# Patient Record
Sex: Female | Born: 1950
Health system: Southern US, Community
[De-identification: ages and names within clinical notes are randomized; demographics above are authoritative.]

## PROBLEM LIST (undated history)

## (undated) DIAGNOSIS — K429 Umbilical hernia without obstruction or gangrene: Secondary | ICD-10-CM

## (undated) DIAGNOSIS — K769 Liver disease, unspecified: Secondary | ICD-10-CM

## (undated) DIAGNOSIS — T7840XA Allergy, unspecified, initial encounter: Secondary | ICD-10-CM

## (undated) DIAGNOSIS — K2100 Gastro-esophageal reflux disease with esophagitis, without bleeding: Secondary | ICD-10-CM

## (undated) DIAGNOSIS — D721 Eosinophilia, unspecified: Secondary | ICD-10-CM

## (undated) DIAGNOSIS — R7303 Prediabetes: Secondary | ICD-10-CM

## (undated) DIAGNOSIS — D72820 Lymphocytosis (symptomatic): Secondary | ICD-10-CM

## (undated) DIAGNOSIS — IMO0001 Reserved for inherently not codable concepts without codable children: Secondary | ICD-10-CM

## (undated) DIAGNOSIS — M949 Disorder of cartilage, unspecified: Secondary | ICD-10-CM

## (undated) DIAGNOSIS — E2740 Unspecified adrenocortical insufficiency: Secondary | ICD-10-CM

## (undated) DIAGNOSIS — M899 Disorder of bone, unspecified: Secondary | ICD-10-CM

## (undated) DIAGNOSIS — E785 Hyperlipidemia, unspecified: Secondary | ICD-10-CM

## (undated) DIAGNOSIS — D649 Anemia, unspecified: Secondary | ICD-10-CM

## (undated) DIAGNOSIS — Z5189 Encounter for other specified aftercare: Secondary | ICD-10-CM

## (undated) DIAGNOSIS — J301 Allergic rhinitis due to pollen: Secondary | ICD-10-CM

## (undated) DIAGNOSIS — R7989 Other specified abnormal findings of blood chemistry: Secondary | ICD-10-CM

## (undated) DIAGNOSIS — I739 Peripheral vascular disease, unspecified: Secondary | ICD-10-CM

## (undated) DIAGNOSIS — R51 Headache: Secondary | ICD-10-CM

## (undated) DIAGNOSIS — M810 Age-related osteoporosis without current pathological fracture: Secondary | ICD-10-CM

## (undated) DIAGNOSIS — M25519 Pain in unspecified shoulder: Secondary | ICD-10-CM

## (undated) DIAGNOSIS — E1165 Type 2 diabetes mellitus with hyperglycemia: Secondary | ICD-10-CM

## (undated) HISTORY — DX: Lymphocytosis (symptomatic): D72.820

## (undated) HISTORY — DX: Gastro-esophageal reflux disease with esophagitis, without bleeding: K21.00

## (undated) HISTORY — DX: Pain in unspecified shoulder: M25.519

## (undated) HISTORY — DX: Age-related osteoporosis without current pathological fracture: M81.0

## (undated) HISTORY — DX: Allergy, unspecified, initial encounter: T78.40XA

## (undated) HISTORY — DX: Eosinophilia, unspecified: D72.10

## (undated) HISTORY — DX: Anemia, unspecified: D64.9

## (undated) HISTORY — DX: Disorder of cartilage, unspecified: M94.9

## (undated) HISTORY — DX: Other specified abnormal findings of blood chemistry: R79.89

## (undated) HISTORY — DX: Type 2 diabetes mellitus with hyperglycemia: E11.65

## (undated) HISTORY — DX: Disorder of bone, unspecified: M89.9

## (undated) HISTORY — DX: Encounter for other specified aftercare: Z51.89

## (undated) HISTORY — DX: Liver disease, unspecified: K76.9

## (undated) HISTORY — DX: Umbilical hernia without obstruction or gangrene: K42.9

## (undated) HISTORY — DX: Hyperlipidemia, unspecified: E78.5

## (undated) HISTORY — DX: Gastro-esophageal reflux disease with esophagitis: K21.0

## (undated) HISTORY — DX: Allergic rhinitis due to pollen: J30.1

## (undated) HISTORY — PX: CATARACT EXTRACTION, BILATERAL: SHX1313

## (undated) HISTORY — DX: Reserved for inherently not codable concepts without codable children: IMO0001

## (undated) HISTORY — DX: Eosinophilia: D72.1

---

## 1998-09-10 ENCOUNTER — Other Ambulatory Visit: Admission: RE | Admit: 1998-09-10 | Discharge: 1998-09-10 | Payer: Self-pay | Admitting: *Deleted

## 2000-11-27 ENCOUNTER — Encounter: Payer: Self-pay | Admitting: *Deleted

## 2000-11-27 ENCOUNTER — Encounter: Admission: RE | Admit: 2000-11-27 | Discharge: 2000-11-27 | Payer: Self-pay | Admitting: *Deleted

## 2002-12-03 ENCOUNTER — Encounter: Payer: Self-pay | Admitting: Gastroenterology

## 2002-12-03 LAB — HM COLONOSCOPY: HM Colonoscopy: NORMAL

## 2003-11-25 ENCOUNTER — Emergency Department (HOSPITAL_COMMUNITY): Admission: EM | Admit: 2003-11-25 | Discharge: 2003-11-25 | Payer: Self-pay | Admitting: Emergency Medicine

## 2003-12-09 ENCOUNTER — Inpatient Hospital Stay (HOSPITAL_COMMUNITY): Admission: EM | Admit: 2003-12-09 | Discharge: 2003-12-11 | Payer: Self-pay | Admitting: Emergency Medicine

## 2003-12-16 ENCOUNTER — Inpatient Hospital Stay (HOSPITAL_COMMUNITY): Admission: EM | Admit: 2003-12-16 | Discharge: 2003-12-20 | Payer: Self-pay | Admitting: Emergency Medicine

## 2003-12-16 ENCOUNTER — Encounter (INDEPENDENT_AMBULATORY_CARE_PROVIDER_SITE_OTHER): Payer: Self-pay | Admitting: *Deleted

## 2003-12-17 ENCOUNTER — Encounter (INDEPENDENT_AMBULATORY_CARE_PROVIDER_SITE_OTHER): Payer: Self-pay | Admitting: Specialist

## 2003-12-17 ENCOUNTER — Encounter (INDEPENDENT_AMBULATORY_CARE_PROVIDER_SITE_OTHER): Payer: Self-pay | Admitting: *Deleted

## 2004-01-04 ENCOUNTER — Emergency Department (HOSPITAL_COMMUNITY): Admission: EM | Admit: 2004-01-04 | Discharge: 2004-01-05 | Payer: Self-pay | Admitting: Emergency Medicine

## 2007-01-04 LAB — HM DEXA SCAN: HM Dexa Scan: NORMAL

## 2007-11-23 ENCOUNTER — Encounter: Payer: Self-pay | Admitting: Gastroenterology

## 2008-05-26 ENCOUNTER — Encounter: Payer: Self-pay | Admitting: Gastroenterology

## 2008-06-06 ENCOUNTER — Encounter: Payer: Self-pay | Admitting: Gastroenterology

## 2008-08-15 ENCOUNTER — Ambulatory Visit: Payer: Self-pay | Admitting: Gastroenterology

## 2008-08-15 DIAGNOSIS — Z862 Personal history of diseases of the blood and blood-forming organs and certain disorders involving the immune mechanism: Secondary | ICD-10-CM

## 2008-08-15 DIAGNOSIS — R945 Abnormal results of liver function studies: Secondary | ICD-10-CM

## 2008-08-15 LAB — CONVERTED CEMR LAB
Basophils Absolute: 0 10*3/uL (ref 0.0–0.1)
Basophils Relative: 0.5 % (ref 0.0–3.0)
Eosinophils Absolute: 1.3 10*3/uL — ABNORMAL HIGH (ref 0.0–0.7)
Eosinophils Relative: 16.2 % — ABNORMAL HIGH (ref 0.0–5.0)
HCT: 36 % (ref 36.0–46.0)
Hemoglobin: 12.3 g/dL (ref 12.0–15.0)
Lymphocytes Relative: 57.5 % — ABNORMAL HIGH (ref 12.0–46.0)
Lymphs Abs: 4.6 10*3/uL — ABNORMAL HIGH (ref 0.7–4.0)
MCHC: 34.3 g/dL (ref 30.0–36.0)
MCV: 84.5 fL (ref 78.0–100.0)
Monocytes Absolute: 0.5 10*3/uL (ref 0.1–1.0)
Monocytes Relative: 6.3 % (ref 3.0–12.0)
Neutro Abs: 1.5 10*3/uL (ref 1.4–7.7)
Neutrophils Relative %: 19.5 % — ABNORMAL LOW (ref 43.0–77.0)
Platelets: 191 10*3/uL (ref 150.0–400.0)
RBC: 4.26 M/uL (ref 3.87–5.11)
RDW: 12.1 % (ref 11.5–14.6)
WBC: 7.9 10*3/uL (ref 4.5–10.5)

## 2010-05-07 ENCOUNTER — Ambulatory Visit
Admission: RE | Admit: 2010-05-07 | Discharge: 2010-05-07 | Disposition: A | Discharge status: Home / Self Care | Payer: PRIVATE HEALTH INSURANCE | Source: Ambulatory Visit | Attending: Internal Medicine | Admitting: Internal Medicine

## 2010-05-07 ENCOUNTER — Other Ambulatory Visit: Payer: Self-pay | Admitting: Internal Medicine

## 2010-05-07 DIAGNOSIS — Z9109 Other allergy status, other than to drugs and biological substances: Secondary | ICD-10-CM

## 2010-05-20 ENCOUNTER — Encounter: Payer: PRIVATE HEALTH INSURANCE | Admitting: Internal Medicine

## 2010-05-21 NOTE — Consult Note (Signed)
Heidi [name], Heidi [name]              ACCOUNT NO.:  [id number]   MEDICAL RECORD NO.:  [id number]          PATIENT TYPE:  INP   LOCATION:  5012                         FACILITY:  [facility]   PHYSICIAN:  James L. Malon Kindle., M.D.DATE OF BIRTH:  [birth year]   DATE OF CONSULTATION:  12/16/2003  DATE OF DISCHARGE:                                   CONSULTATION   REFERRING PHYSICIAN:  Fleet Contras, M.D.   CONSULTING PHYSICIAN:  Llana Aliment. Randa Evens, M.D.   HISTORY OF PRESENT ILLNESS:  This is a 60 year old woman who was admitted  through the emergency department for a second admission in the past two  weeks with nausea, vomiting and diarrhea.  She notes that she was well until  approximately three weeks ago when she had fairly sudden onset of diarrhea  with nausea and vomiting.  She saw her physician in [facility] and  apparently had Clostridium difficile that was positive and she was treated  initially with Flagyl.  She tolerated this poorly and apparently was  switched over to vancomycin.  It is unclear how long she took it.  Approximately two weeks ago she was admitted her to [facility]  Clostridium difficile toxin was repeated twice and was negative.  On  December 7 a sedimentation rate was normal at that time as well.  The  patient got a little better, went home only to have her nausea, vomiting and  diarrhea come back again.  She came in through the emergency room today with  cramping, loose stools of three to four per day and watery.  She vomits and  has been unable to keep down liquid or any other material.  She has become  progressively weaker over the past two or three weeks. She is dizzy, has a  headache and has been febrile.  Her husband notes that last night she woke  up at 3 o'clock with severe nausea, vomiting, loose stools and complaining  of a headache.  She had a CT scan of her head with no clear intracranial  findings, but with evidence of chronic small vessel  disease.  A CT scan of  her abdomen was done during the previous admission on December 7. This  revealed no acute findings.  The colon looked normal.  A CT of the pelvis  was negative as well.   Pertinent laboratories are remarkable for a normal white count this morning.  Sodium was down to 121 earlier this morning although it had been in the  normal range during her last visit.  Liver function tests were remarkable  with a low albumin of 2.8.  Amylase and lipase were elevated.  Iron studies  were low slightly.  B12 and folic levels were normal.  Urinalysis was  unremarkable.  The patient notes that despite taking the Reglan, vancomycin  and Flagyl that nothing has improved.  She was also placed on Protonix  somewhere along the way and this has not really helped.   CHRONIC MEDS PRIOR TO THIS STARTING:  None.   ALLERGIES:  She is intolerant of METRONIDAZOLE that caused nausea and  vomiting, but no clear allergies otherwise.   PAST MEDICAL HISTORY:  The patient has no chronic medical problems.   PAST SURGICAL HISTORY:  1.  Previous cesarean section.   FAMILY HISTORY:  Noncontributory.   SOCIAL HISTORY:  She is originally from [place].  She is married and  lives with her family.  She is a Theatre stage manager at [employer].  She  does not smoke or drink.   REVIEW OF SYMPTOMS:  Covered primarily in the history of present illness.   PHYSICAL EXAMINATION:  VITAL SIGNS:  The patient's temperature is 102.0 and  other vital signs are unremarkable.  GENERAL:  She is alert and oriented.  She looks warm and feels warm.  EYES:  The sclerae are non-icteric.  Extraocular movements are intact.  HEENT:  The neck is supple.  The throat reveals some mucous membranes to be  dry.  HEART AND LUNG EXAMS:  Normal.  ABDOMEN:  Soft with markedly hyperactive bowel sounds.   ASSESSMENT:  Nausea, vomiting, diarrhea and fever.  This could be due to a  multitude of things, but I think with the  recent Clostridium difficile  colitis we need to go ahead and evaluate further for this and make sure this  has cleared up with appropriate treatment.  If so, we need to explore other  causes.  It is possible this could be an ulcer and while she is getting a  sigmoidoscopy, I would go ahead and do an upper endoscopy.  If this is  negative, we may need to consider an ID consult.       JLE/MEDQ  D:  12/16/2003  T:  12/16/2003  Job:  [id number]

## 2010-05-21 NOTE — Op Note (Signed)
NAMEDARREL, BARONI              ACCOUNT NO.:  [id number]   MEDICAL RECORD NO.:  [id number]          PATIENT TYPE:  INP   LOCATION:  5012                         FACILITY:  [facility]   PHYSICIAN:  James L. Malon Kindle., M.D.DATE OF BIRTH:  December 10, 1950   DATE OF PROCEDURE:  12/16/2003  DATE OF DISCHARGE:                                 OPERATIVE REPORT   PROCEDURE:  Esophagogastroduodenoscopy.   MEDICATIONS GIVEN:  The patient was sedated with Fentanyl 40 mcg, Versed 4  mg prior to this procedure for the sigmoidoscopy.   INDICATIONS FOR PROCEDURE:  Severe nausea and vomiting.   DESCRIPTION OF PROCEDURE:  The procedure was explained to the patient and  consent obtained.  In the left lateral decubitus position, it took sometime  to pass the scope, the patient did not want to swallow but eventually she  did, and we passed down into the stomach.  The pyloric channel was  identified and passed.  The duodenum was completely normal as was the  pyloric channel.  No ulcers.  The duodenal bulb was clear.  The antrum and  body of the stomach were normal.  There was no ulceration or inflammation.  The distal esophagus was seen well upon withdrawal as was the proximal  esophagus, there were no abnormalities seen.  The scope was withdrawn.  The  patient tolerated the procedure well.   ASSESSMENT:  Nausea and vomiting, negative endoscopy, suspect due to  colitis.   PLAN:  Will treat the colitis and follow clinically.       JLE/MEDQ  D:  12/16/2003  T:  12/16/2003  Job:  [id number]   cc:   Fleet Contras, M.D.  [address]  [place]  [place] [address]  Fax: [phone]

## 2010-05-21 NOTE — Discharge Summary (Signed)
Heidi, [name]           ACCOUNT NO.:  [id number]   MEDICAL RECORD NO.:  [id number]          PATIENT TYPE:  INP   LOCATION:  5012                         FACILITY:  [facility]   PHYSICIAN:  Fleet Contras, M.D.    DATE OF BIRTH:  [birth year]   DATE OF ADMISSION:  12/16/2003  DATE OF DISCHARGE:  12/20/2003                                 DISCHARGE SUMMARY   ADMITTING DIAGNOSES:  1.  Dehydration.  2.  Persistent vomiting.  3.  Hyponatremia.   DISCHARGE DIAGNOSES:  1.  Pseudomembranous colitis secondary to Clostridium difficile infection.  2.  Dehydration, resolved.  3.  Hyponatremia, resolved.   PRESENTATION:  Heidi [name] is a 60 year old African-American lady admitted  via the Emergency Room at [facility] for the second time in the  last two weeks with nausea, vomiting, and diarrhea.  She was fairly well  until about three weeks ago when she developed fairly sudden onset of  diarrhea with nausea and vomiting.  She was seen by her primary care  physician at [facility], Dr. Orpha Bur who apparently  treated her for Clostridium difficile infection diagnosed by stool cultures.  She initially was treated with Flagyl but she did not tolerate this well and  was switched to vancomycin.  While on vancomycin she also developed fairly  severe nausea, vomiting, hyponatremia, and dehydration and had to be  admitted about a week ago at [facility].  At that time Clostridium  difficile testing on her stool was negative for toxin x2.  She had a CT scan  of the abdomen and pelvis that were performed showing incidental finding of  hepatic and renal cysts, but no acute process.  The colon looked normal on  that scan.  She also had an HIV test which was negative.  Sedimentation rate  was normal at that time.  Her liver function was normal.  Once her  electrolytes were normalized she was discharged home.  On December 16, 2003  she presented again to the  emergency room with recurrent symptoms as well as  dizziness, headaches, and fevers and chills.  At the emergency room she was  noted to be dehydrated, lethargic, and acutely ill.  A CT scan of her head  was negative for acute intracranial findings except for some chronic small  vessel disease.  Her blood pressure showed systolic of 98, diastolic of 61.  Heart rate was 77, respiratory rate of 18.  Temperature was 97.4.  Oxygen  saturation on room air was 92%.  She was not icteric.  She was not cyanotic.  Abdomen was soft with vague tenderness in the epigastrium.  She was alert  and oriented x3 with no focal neurological deficits.  Initial laboratory  data showed a serum pH of 7.5, sodium of 121, potassium 3.3, chloride 99,  bicarbonate of 17, BUN of 5, creatinine 0.7, sugar of 82.  Total bilirubin  was 0.8.  AST/ALT, and alkaline phosphatase were all normal.  Albumin was  2.8, calcium 7.1.  White count was 5.1, hemoglobin 10, hematocrit 30,  platelet count of 291.  This  was normochromic and normocytic.  Due to the  severity of her symptoms and the recurrent nature, she was admitted to the  hospital for further evaluation.   HOSPITAL COURSE:  On admission patient was started on intravenous fluids  with D5 normal saline with potassium replacement.  She was also started on  intravenous antiemetics.  Serum TSH and cortisol a.m. level were performed  and these came back normal.  She also had checked for metallic poisoning.  Lead and mercury levels were both normal.  About 12 hours after her  admission her temperature rose up to about 102.4.  Blood cultures were  performed as well as urinalysis and urine cultures and this again returned  normal.  A gastroenterology consult was requested and patient was seen by  Dr. Carman Ching of [facility].  He immediately performed an  upper GI endoscopy and colonoscopy.  The upper GI endoscopy revealed no  significant findings.  Colonoscopy,  however, showed adenomatous colonic  mucosa suggestive of inadequately treated pseudomembranous colitis.  She was  therefore resumed on oral vancomycin while awaiting stool aspirates and  colon biopsies.  Within 24 hours she defervesced and was able to tolerate  clear liquid diet.  Her diet was gradually advanced and she was continued on  vancomycin 125 mg p.o. q.i.d.  She received Imodium p.r.n. for diarrhea  stools.  In the last 24 hours she has not had any further diarrhea, nausea,  vomiting, or abdominal pain.  She is tolerating food, diet without any  vomiting.  She is well hydrated.  Vital signs are stable and she has been  afebrile for 48 hours.  Chest is clear to auscultation.  Heart sounds 1 and  2 are heard with no murmurs.  Abdomen is soft, nontender.  Bowel sounds are  present.  Extremities show no edema or ulcerations.  Cranial nerves:  She is  alert and oriented x3 with no focal neurological deficits.   LABORATORY DATA:  On December 17, 2003 hemoglobin was 10.7, hematocrit 30.9,  white count 6.2.  On December 18, 2003 her sodium was 136, potassium 3.5,  chloride 112, bicarbonate 20, glucose 87, BUN less than 1, creatinine 0.9,  calcium 7.9.  Blood cultures x2 so far has been negative.  Urinalysis was  negative and urine culture was negative.  Lead levels were 2 and mercury  levels 5.  Cortisol level was 15.5 which was within normal limits.  TSH  0.498, within normal limits.  CPK was 72.  Myoglobin was normal.  Her ANA  was negative.  Helicobacter pylori antibody test was 1.2 which was slightly  positive.  Stool for ovum and parasites were negative.  Stool aspirate for  Clostridium difficile toxin was positive.   ASSESSMENT:  Heidi [name] is a 60 year old African-American lady with  recurrent and persistent Clostridium difficile associated pseudomembranous  colitis manifesting as severe nausea, vomiting, and diarrhea resulting in dehydration and electrolyte imbalance.   She has received intravenous fluids  as well as electrolyte replacement and she is currently on therapy for  Clostridium difficile colitis with oral vancomycin.  She has been tolerating  this for the last 48 hours.  She has not had any fevers or chills and her  laboratory data within normal limits.  She is considered stable for  discharge home today.  She will continue on vancomycin for another 10 days.   DISCHARGE MEDICATIONS:  1.  Protonix 40 mg one daily.  2.  Reglan 5 mg one  p.o. t.i.d. a.c.  3.  Imodium 2 mg one p.o. p.r.n. with loose bowel movements.  4.  Vancomycin 125 mg p.o. q.i.d. for 10 days.   She is to maintain universal precautions at home so she does not disseminate  the disease to her family members.  She will follow up with Dr. Orpha Bur  at [facility] in [place] in one to two weeks.  This  plan of care has been discussed with her, her husband, and the rest of her  family and all their questions have been answered.       EA/MEDQ  D:  12/20/2003  T:  12/22/2003  Job:  [id number]   cc:   Orpha Bur, M.D.  [facility]

## 2010-05-21 NOTE — Op Note (Signed)
Heidi [name], Heidi [name]              ACCOUNT NO.:  [id number]   MEDICAL RECORD NO.:  [id number]          PATIENT TYPE:  INP   LOCATION:  5012                         FACILITY:  [facility]   PHYSICIAN:  James L. Malon Kindle., M.D.DATE OF BIRTH:  Aug 30, 1950   DATE OF PROCEDURE:  12/16/2003  DATE OF DISCHARGE:                                 OPERATIVE REPORT   PROCEDURE:  Sigmoidoscopy and biopsy.   MEDICATIONS:  1.  Fentanyl 40 mcg.  2.  Versed 4 mg IV.   INDICATION:  A 60 year old with nausea, vomiting and diarrhea who has had a  positive C. diff toxin in the past with recent C. diff toxin negative.  Had  some Flagyl and vancomycin in [place], not really sure how long she  took it.  This is done to evaluate for recurrent Pseudomembranous colitis.   DESCRIPTION OF PROCEDURE:  Procedure had been explained to the patient.  We  did this first since she was unable to place the biteblock in her mouth  after the sedation for the upper endo.  The flexible scope was used and we  advanced up to 90 cm, there was a large quantity of liquid stool, a sample  of which was sucked out.  Throughout the colon up to that point there was  markedly edematous mucosa with several punctate ulcerations throughout.  It  had the appearance of a colitis, no clear cut pseudomembranes.  Multiple  biopsies were taken and mucosa was quite friable.  The scope was withdrawn.  These findings extended from the rectum to 90 cm from the anal verge.   ASSESSMENT:  Colitis, probably inadequately treated Clostridium difficile  colitis although infectious inflammatory bowel disease is still in the  differential.   PLAN:  Will start the patient back empirically on oral vancomycin waiting  for studies to return and will check path report, send stool for C. diff,  ova and parasite, white cell smear and culture.  Will proceed with endoscopy  at this time.       JLE/MEDQ  D:  12/16/2003  T:  12/16/2003  Job:  [id number]

## 2010-05-24 ENCOUNTER — Encounter (HOSPITAL_BASED_OUTPATIENT_CLINIC_OR_DEPARTMENT_OTHER): Payer: PRIVATE HEALTH INSURANCE | Admitting: Internal Medicine

## 2010-05-24 ENCOUNTER — Other Ambulatory Visit: Payer: Self-pay | Admitting: Internal Medicine

## 2010-05-24 DIAGNOSIS — J309 Allergic rhinitis, unspecified: Secondary | ICD-10-CM

## 2010-05-24 LAB — COMPREHENSIVE METABOLIC PANEL
AST: 19 U/L (ref 0–37)
Alkaline Phosphatase: 71 U/L (ref 39–117)
BUN: 19 mg/dL (ref 6–23)
Calcium: 9.2 mg/dL (ref 8.4–10.5)
Creatinine, Ser: 1.02 mg/dL (ref 0.40–1.20)

## 2010-05-24 LAB — CBC WITH DIFFERENTIAL/PLATELET
Basophils Absolute: 0.1 10*3/uL (ref 0.0–0.1)
Eosinophils Absolute: 1.6 10*3/uL — ABNORMAL HIGH (ref 0.0–0.5)
HCT: 34.9 % (ref 34.8–46.6)
LYMPH%: 42.5 % (ref 14.0–49.7)
MCV: 86.9 fL (ref 79.5–101.0)
MONO#: 0.4 10*3/uL (ref 0.1–0.9)
MONO%: 5.1 % (ref 0.0–14.0)
NEUT#: 2.1 10*3/uL (ref 1.5–6.5)
NEUT%: 28.8 % — ABNORMAL LOW (ref 38.4–76.8)
Platelets: 191 10*3/uL (ref 145–400)
RBC: 4.02 10*6/uL (ref 3.70–5.45)
WBC: 7.1 10*3/uL (ref 3.9–10.3)

## 2010-08-30 ENCOUNTER — Other Ambulatory Visit: Payer: Self-pay | Admitting: Internal Medicine

## 2010-08-30 ENCOUNTER — Encounter (HOSPITAL_BASED_OUTPATIENT_CLINIC_OR_DEPARTMENT_OTHER): Payer: PRIVATE HEALTH INSURANCE | Admitting: Internal Medicine

## 2010-08-30 DIAGNOSIS — J309 Allergic rhinitis, unspecified: Secondary | ICD-10-CM

## 2010-08-30 LAB — CBC WITH DIFFERENTIAL/PLATELET
Basophils Absolute: 0.1 10*3/uL (ref 0.0–0.1)
Eosinophils Absolute: 1.6 10*3/uL — ABNORMAL HIGH (ref 0.0–0.5)
HGB: 12.6 g/dL (ref 11.6–15.9)
MCV: 86.1 fL (ref 79.5–101.0)
MONO#: 0.4 10*3/uL (ref 0.1–0.9)
MONO%: 5.9 % (ref 0.0–14.0)
NEUT#: 1.7 10*3/uL (ref 1.5–6.5)
Platelets: 190 10*3/uL (ref 145–400)
RDW: 13.1 % (ref 11.2–14.5)

## 2010-08-30 LAB — COMPREHENSIVE METABOLIC PANEL
Alkaline Phosphatase: 72 U/L (ref 39–117)
BUN: 17 mg/dL (ref 6–23)
Glucose, Bld: 52 mg/dL — ABNORMAL LOW (ref 70–99)
Total Bilirubin: 0.4 mg/dL (ref 0.3–1.2)

## 2011-01-22 ENCOUNTER — Telehealth: Payer: Self-pay | Admitting: Internal Medicine

## 2011-01-22 NOTE — Telephone Encounter (Signed)
Talked to pt, gave her appt for 02/28/11 , lab and MD

## 2011-02-28 ENCOUNTER — Ambulatory Visit (HOSPITAL_BASED_OUTPATIENT_CLINIC_OR_DEPARTMENT_OTHER): Payer: PRIVATE HEALTH INSURANCE | Admitting: Internal Medicine

## 2011-02-28 ENCOUNTER — Telehealth: Payer: Self-pay | Admitting: Internal Medicine

## 2011-02-28 ENCOUNTER — Other Ambulatory Visit: Payer: PRIVATE HEALTH INSURANCE | Admitting: Lab

## 2011-02-28 VITALS — BP 108/65 | HR 59 | Temp 96.9°F | Wt 121.3 lb

## 2011-02-28 DIAGNOSIS — D721 Eosinophilia, unspecified: Secondary | ICD-10-CM

## 2011-02-28 DIAGNOSIS — J309 Allergic rhinitis, unspecified: Secondary | ICD-10-CM

## 2011-02-28 LAB — CBC WITH DIFFERENTIAL/PLATELET
Basophils Absolute: 0.2 10*3/uL — ABNORMAL HIGH (ref 0.0–0.1)
Eosinophils Absolute: 3.1 10*3/uL — ABNORMAL HIGH (ref 0.0–0.5)
HCT: 36 % (ref 34.8–46.6)
HGB: 11.8 g/dL (ref 11.6–15.9)
LYMPH%: 41.9 % (ref 14.0–49.7)
MONO#: 0.4 10*3/uL (ref 0.1–0.9)
NEUT#: 1.6 10*3/uL (ref 1.5–6.5)
NEUT%: 17.4 % — ABNORMAL LOW (ref 38.4–76.8)
Platelets: 180 10*3/uL (ref 145–400)
WBC: 9 10*3/uL (ref 3.9–10.3)

## 2011-02-28 LAB — LACTATE DEHYDROGENASE: LDH: 184 U/L (ref 94–250)

## 2011-02-28 NOTE — Telephone Encounter (Signed)
Gv pt appt for [id number].  called Asher Muir @ Infectious disease and informed her of referral for appt

## 2011-02-28 NOTE — Progress Notes (Signed)
[facility] [phone]   [phone]  OFFICE PROGRESS NOTE   DIAGNOSIS: Reactive eosinophilia  CURRENT THERAPY: Observation.   INTERVAL HISTORY: Heidi [name] 61 y.o. female returns to the clinic today for six-month followup visit. The patient is feeling fine today with no specific complaints. She denied having any significant fever or chills, no skin rash. No weight loss or night sweats. She continues her treatment with Zyrtec for seasonal allergy. The most recent covered history for this patient was 2 years ago to [place], her native country. She has repeat CBC performed earlier today and she is here for evaluation and discussion of her lab results.  ALLERGIES:   has no known allergies.  MEDICATIONS:  Current Outpatient Prescriptions  Medication Sig Dispense Refill  . aspirin 81 MG tablet Take 81 mg by mouth daily.      . calcium-vitamin D (OSCAL WITH D) 500-200 MG-UNIT per tablet Take 1 tablet by mouth daily.      . Multiple Vitamin (MULTIVITAMIN) tablet Take 1 tablet by mouth daily.       REVIEW OF SYSTEMS:  A comprehensive review of systems was negative.   PHYSICAL EXAMINATION: General appearance: alert, cooperative and no distress Neck: no adenopathy Lymph nodes: Cervical, supraclavicular, and axillary nodes normal. Resp: clear to auscultation bilaterally Cardio: regular rate and rhythm, S1, S2 normal, no murmur, click, rub or gallop GI: soft, non-tender; bowel sounds normal; no masses,  no organomegaly Extremities: extremities normal, atraumatic, no cyanosis or edema Neurologic: Alert and oriented X 3, normal strength and tone. Normal symmetric reflexes. Normal coordination and gait  ECOG PERFORMANCE STATUS: 0 - Asymptomatic  Blood pressure 108/65, pulse 59, temperature 96.9 F (36.1 C), temperature source Oral, weight 121 lb 4.8 oz (55.021 kg).  LABORATORY DATA: Lab Results  Component Value Date   WBC 9.0 02/28/2011   HGB  11.8 02/28/2011   HCT 36.0 02/28/2011   MCV 88.0 02/28/2011   PLT 180 02/28/2011      Chemistry      Component Value Date/Time   NA 142 08/30/2010 0904   NA 142 08/30/2010 0904   K 4.2 08/30/2010 0904   K 4.2 08/30/2010 0904   CL 107 08/30/2010 0904   CL 107 08/30/2010 0904   CO2 27 08/30/2010 0904   CO2 27 08/30/2010 0904   BUN 17 08/30/2010 0904   BUN 17 08/30/2010 0904   CREATININE 1.03 08/30/2010 0904   CREATININE 1.03 08/30/2010 0904      Component Value Date/Time   CALCIUM 9.6 08/30/2010 0904   CALCIUM 9.6 08/30/2010 0904   ALKPHOS 72 08/30/2010 0904   ALKPHOS 72 08/30/2010 0904   AST 22 08/30/2010 0904   AST 22 08/30/2010 0904   ALT 15 08/30/2010 0904   ALT 15 08/30/2010 0904   BILITOT 0.4 08/30/2010 0904   BILITOT 0.4 08/30/2010 0904       RADIOGRAPHIC STUDIES: No results found.  ASSESSMENT: This is a very pleasant 61 years old African female originally from [place]. She has persistent eosinophilia questionable to be reactive in nature secondary to parasitic infection versus seasonal allergy, but chronic eosinophilic syndrome cannot be ordered out at this point. The patient is currently asymptomatic.  PLAN:  #1 I would continue his admission observation for now with repeat CBC, LDH and a comprehensive metabolic panel in 3 months. #2 I will refer your the patient to infectious disease for evaluation and to rule out any parasitic infection as a  cause of her eosinophilia. The patient was advised to call me immediately if she has any concerning symptoms in the interval.   All questions were answered. The patient knows to call the clinic with any problems, questions or concerns. We can certainly see the patient much sooner if necessary.

## 2011-04-04 ENCOUNTER — Ambulatory Visit (INDEPENDENT_AMBULATORY_CARE_PROVIDER_SITE_OTHER): Payer: PRIVATE HEALTH INSURANCE | Admitting: Internal Medicine

## 2011-04-04 ENCOUNTER — Ambulatory Visit: Payer: PRIVATE HEALTH INSURANCE | Admitting: Internal Medicine

## 2011-04-04 ENCOUNTER — Encounter: Payer: Self-pay | Admitting: Internal Medicine

## 2011-04-04 VITALS — BP 109/72 | HR 69 | Temp 98.0°F | Ht 61.0 in | Wt 119.0 lb

## 2011-04-05 ENCOUNTER — Encounter: Payer: Self-pay | Admitting: Internal Medicine

## 2011-04-05 LAB — OVA AND PARASITE EXAMINATION: OP: NONE SEEN

## 2011-04-05 NOTE — Assessment & Plan Note (Signed)
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  Her liver cyst does bring up the possibility of echinococcus.  Eosinophilia that is persistent tends to be related to helminth infections and less likely protozoa.  I will check for the possibilities with ova and parasite exam of the stool x 3, the sputum x 3 (ie paragonamiasis) and serology for strongyloides, toxocara, HIV.    I also will try to discuss the case with Dr. Irish Lack at [facility] in [place] who has extensive experience in this area to see if there are other infectious causes to consider in an otherwise asymptomatic patient.  Thanks for the intriguing consult.  She will return to clinic in 3-4 weeks to discuss the lab results.

## 2011-04-05 NOTE — Progress Notes (Signed)
  Subjective:    Patient ID: Heidi [name], female    DOB: 1950-11-27, 61 y.o.   MRN: [id number]  HPI 61 yo female originally from [place] who has been followed by Dr. Shirline Frees for persistent eosinophilia.  The patient reports knowing about this eosinophilia for at least 20 years and no etiology has ever been noted.  She has been in the [place] since about 1991 with very infrequent trips back to [place], the most recent one last year for 3 weeks.  [place] farm and not directly interacting with animals.  She does not remember a specific episode in her life of a parasitic disease.  She has had persistent eosinophilia > 1000 and most recently was noted to be > 3000.  She does have seasonal allergies but controlled with claritin.  She does not get hives or other rashes, no excessive itching.  No excessive diarrhea, joint complaints, abdominal discomfort, weight loss, breathing problems.  She has had a CXR in the past that was negative and a CT scan that did show a cyst lesion on the right lobe of the liver and smaller ones on the kidney.  No abnormal LFTs or renal dysfunction.  Allergy symptoms have only been sneezing, runny nose.     Review of Systems  Constitutional: Negative for fever, chills, activity change, appetite change, fatigue and unexpected weight change.  HENT: Positive for rhinorrhea, sneezing and postnasal drip. Negative for sore throat, mouth sores, trouble swallowing, neck stiffness, voice change, sinus pressure and ear discharge.   Eyes: Positive for itching. Negative for photophobia, discharge, redness and visual disturbance.  Cardiovascular: Negative for chest pain, palpitations and leg swelling.  Gastrointestinal: Negative for nausea, abdominal pain, diarrhea, constipation and abdominal distention.  Genitourinary: Negative for dysuria, hematuria, menstrual problem and pelvic pain.  Musculoskeletal: Negative for myalgias, joint swelling and  arthralgias.  Skin: Negative for pallor and rash.  Neurological: Negative for dizziness, facial asymmetry, weakness and headaches.  Hematological: Negative for adenopathy. Does not bruise/bleed easily.  Psychiatric/Behavioral: Negative for sleep disturbance and dysphoric mood. The patient is not nervous/anxious.        Objective:   Physical Exam  Constitutional: She is oriented to person, place, and time. She appears well-developed and well-nourished. No distress.  HENT:  Right Ear: External ear normal.  Left Ear: External ear normal.  Mouth/Throat: Oropharynx is clear and moist. No oropharyngeal exudate.  Eyes: Right eye exhibits no discharge. Left eye exhibits no discharge. No scleral icterus.  Neck: No tracheal deviation present.  Cardiovascular: Normal rate, regular rhythm and normal heart sounds.  Exam reveals no gallop and no friction rub.   No murmur heard. Pulmonary/Chest: Effort normal and breath sounds normal. No respiratory distress. She has no wheezes. She has no rales.  Abdominal: Soft. Bowel sounds are normal. She exhibits no distension. There is no tenderness. There is no rebound.  Musculoskeletal: Normal range of motion. She exhibits no edema.  Lymphadenopathy:    She has no cervical adenopathy.  Neurological: She is alert and oriented to person, place, and time. No cranial nerve deficit.  Skin: Skin is warm and dry. No rash noted. No erythema.  Psychiatric: She has a normal mood and affect. Her behavior is normal.          Assessment & Plan:

## 2011-04-06 ENCOUNTER — Other Ambulatory Visit: Payer: PRIVATE HEALTH INSURANCE

## 2011-04-08 LAB — OVA AND PARASITE EXAMINATION
OP: NONE SEEN
OP: NONE SEEN
OP: NONE SEEN
OP: NONE SEEN
OP: NONE SEEN

## 2011-04-08 LAB — TOXOCARA ANTIBODIES, BLD

## 2011-04-08 LAB — STRONGYLOIDES ANTIBODY

## 2011-04-25 ENCOUNTER — Encounter: Payer: Self-pay | Admitting: Internal Medicine

## 2011-04-25 ENCOUNTER — Ambulatory Visit (INDEPENDENT_AMBULATORY_CARE_PROVIDER_SITE_OTHER): Payer: PRIVATE HEALTH INSURANCE | Admitting: Internal Medicine

## 2011-04-25 VITALS — BP 122/79 | HR 71 | Temp 97.7°F | Ht 62.0 in | Wt 118.0 lb

## 2011-04-25 DIAGNOSIS — D721 Eosinophilia, unspecified: Secondary | ICD-10-CM

## 2011-04-25 MED ORDER — IVERMECTIN 3 MG PO TABS: 10.5000 mg | ORAL_TABLET | Freq: Once | ORAL | Status: DC

## 2011-04-25 NOTE — Progress Notes (Signed)
  Subjective:    Patient ID: Heidi [name], female    DOB: 1950-11-06, 61 y.o.   MRN: [id number]  HPI Patient here to review her labs.  Has had asymptomatic eosinophilia for years, at least since early 47s.  Had evaluation including O and P in stool and sputum, blood evaluation.  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  Remains asympotomatic.     Review of Systems  Gastrointestinal: Negative for nausea, vomiting, diarrhea and abdominal distention.  Musculoskeletal: Negative for myalgias, joint swelling and arthralgias.  Skin: Negative for rash.  Hematological: Negative for adenopathy.  Psychiatric/Behavioral: Negative for dysphoric mood. The patient is not nervous/anxious.        Objective:   Physical Exam  Constitutional: She appears well-developed and well-nourished. No distress.  Cardiovascular: Normal rate, regular rhythm and normal heart sounds.  Exam reveals no gallop and no friction rub.   No murmur heard. Pulmonary/Chest: Effort normal and breath sounds normal. No respiratory distress. She has no wheezes. She has no rales.          Assessment & Plan:

## 2011-04-25 NOTE — Assessment & Plan Note (Addendum)
A positive serology with strongyloides certainly could be the cause.  I have discussed the findings with her and her husband and the possibility of an asymptomatic Strongyloides infection as the cause.  I have discussed treatement options and she has opted to try the treatment.  She will take 10.5 mg Ivermectin x 1 (252mcg/kg) and repeat 2 weeks later.  She then will return in 3 months for a follow up CBC with diff and appt.  I explained that it can take 6 months for eosinophilia to resolve if, indeed, that is the cause.  I also explained that if it persists beyond 6 months, Strongyloides likely is not the problem.

## 2011-05-01 ENCOUNTER — Ambulatory Visit (INDEPENDENT_AMBULATORY_CARE_PROVIDER_SITE_OTHER): Payer: PRIVATE HEALTH INSURANCE | Admitting: Internal Medicine

## 2011-05-01 VITALS — BP 115/76 | HR 78 | Temp 98.3°F | Resp 16 | Ht 60.75 in | Wt 120.0 lb

## 2011-05-01 DIAGNOSIS — K529 Noninfective gastroenteritis and colitis, unspecified: Secondary | ICD-10-CM

## 2011-05-01 DIAGNOSIS — R1110 Vomiting, unspecified: Secondary | ICD-10-CM

## 2011-05-01 DIAGNOSIS — N259 Disorder resulting from impaired renal tubular function, unspecified: Secondary | ICD-10-CM

## 2011-05-01 MED ORDER — ONDANSETRON 4 MG PO TBDP
8.0000 mg | ORAL_TABLET | Freq: Once | ORAL | Status: AC
  Administered 2011-05-01: 8 mg via ORAL

## 2011-05-01 MED ORDER — ONDANSETRON HCL 8 MG PO TABS: 8.0000 mg | ORAL_TABLET | Freq: Three times a day (TID) | ORAL | Status: AC | PRN

## 2011-05-01 NOTE — Patient Instructions (Signed)

## 2011-05-01 NOTE — Progress Notes (Signed)
  Subjective:    Patient ID: Heidi [name], female    DOB: Feb 19, 1950, 61 y.o.   MRN: [id number]  HPI Started at 2am with diarrhea watery and brown, then vomited 3x. No blood, fever ,or abdominal pain No exposure hx at all. No dizzyness   Review of Systems     Objective:   Physical Exam Alert and active Abd is soft and not tender, BS active       Assessment & Plan:  Viral gastroenteritis No signs of dehydration Zofran 8mg  dissolve now Hydrate and diet discussed

## 2011-05-23 ENCOUNTER — Other Ambulatory Visit (HOSPITAL_BASED_OUTPATIENT_CLINIC_OR_DEPARTMENT_OTHER): Payer: PRIVATE HEALTH INSURANCE | Admitting: Lab

## 2011-05-23 ENCOUNTER — Ambulatory Visit (HOSPITAL_BASED_OUTPATIENT_CLINIC_OR_DEPARTMENT_OTHER): Payer: PRIVATE HEALTH INSURANCE | Admitting: Internal Medicine

## 2011-05-23 VITALS — BP 111/77 | HR 69 | Temp 97.6°F | Ht 61.0 in | Wt 118.3 lb

## 2011-05-23 DIAGNOSIS — D72820 Lymphocytosis (symptomatic): Secondary | ICD-10-CM

## 2011-05-23 LAB — CBC WITH DIFFERENTIAL/PLATELET
BASO%: 1.2 % (ref 0.0–2.0)
Basophils Absolute: 0.1 10*3/uL (ref 0.0–0.1)
EOS%: 10.9 % — ABNORMAL HIGH (ref 0.0–7.0)
MCH: 28.6 pg (ref 25.1–34.0)
MCHC: 32.8 g/dL (ref 31.5–36.0)
MCV: 87 fL (ref 79.5–101.0)
MONO%: 6.8 % (ref 0.0–14.0)
RBC: 4.12 10*6/uL (ref 3.70–5.45)
RDW: 12.8 % (ref 11.2–14.5)
lymph#: 3.6 10*3/uL — ABNORMAL HIGH (ref 0.9–3.3)
nRBC: 0 % (ref 0–0)

## 2011-05-23 LAB — COMPREHENSIVE METABOLIC PANEL
Alkaline Phosphatase: 76 U/L (ref 39–117)
BUN: 13 mg/dL (ref 6–23)
CO2: 28 mEq/L (ref 19–32)
Creatinine, Ser: 0.9 mg/dL (ref 0.50–1.10)
Glucose, Bld: 70 mg/dL (ref 70–99)
Sodium: 138 mEq/L (ref 135–145)
Total Bilirubin: 0.3 mg/dL (ref 0.3–1.2)
Total Protein: 7.3 g/dL (ref 6.0–8.3)

## 2011-05-23 LAB — LACTATE DEHYDROGENASE: LDH: 156 U/L (ref 94–250)

## 2011-05-23 NOTE — Progress Notes (Signed)
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 Luciana Axe.  CURRENT THERAPY: None.  INTERVAL HISTORY: Heidi [name] 61 y.o. female returns to the clinic today for followup visit. The patient is feeling fine today with no specific complaints. She was referred to Dr. Luciana Axe with infectious disease and serology was positive for strongyloides infection. She was treated with 2 doses of Ivermectin. She is feeling much better. He has repeat CBC performed earlier today and she is here for evaluation and discussion of her lab results. No significant complaints.  MEDICAL HISTORY: Past Medical History  Diagnosis Date  . Allergy     ALLERGIES:   has no known allergies.  MEDICATIONS:  Current Outpatient Prescriptions  Medication Sig Dispense Refill  . aspirin 81 MG tablet Take 81 mg by mouth daily.      . calcium-vitamin D (OSCAL WITH D) 500-200 MG-UNIT per tablet Take 1 tablet by mouth daily.      . cetirizine (ZYRTEC) 5 MG tablet Take 5 mg by mouth as needed.       . Multiple Vitamin (MULTIVITAMIN) tablet Take 1 tablet by mouth daily.        REVIEW OF SYSTEMS:  A comprehensive review of systems was negative.   PHYSICAL EXAMINATION: General appearance: alert, cooperative and no distress Neck: no adenopathy Lymph nodes: Cervical, supraclavicular, and axillary nodes normal. Resp: clear to auscultation bilaterally Cardio: regular rate and rhythm, S1, S2 normal, no murmur, click, rub or gallop GI: soft, non-tender; bowel sounds normal; no masses,  no organomegaly Extremities: extremities normal, atraumatic, no cyanosis or edema  ECOG PERFORMANCE STATUS: 0 - Asymptomatic  Blood pressure 111/77, pulse 69, temperature 97.6 F (36.4 C), temperature source Oral, height 5\' 1"  (1.549 m),  weight 118 lb 4.8 oz (53.661 kg).  LABORATORY DATA: Lab Results  Component Value Date   WBC 6.7 05/23/2011   HGB 11.8 05/23/2011   HCT 35.8 05/23/2011   MCV 87.0 05/23/2011   PLT 221 05/23/2011      Chemistry      Component Value Date/Time   NA 142 08/30/2010 0904   NA 142 08/30/2010 0904   K 4.2 08/30/2010 0904   K 4.2 08/30/2010 0904   CL 107 08/30/2010 0904   CL 107 08/30/2010 0904   CO2 27 08/30/2010 0904   CO2 27 08/30/2010 0904   BUN 17 08/30/2010 0904   BUN 17 08/30/2010 0904   CREATININE 1.03 08/30/2010 0904   CREATININE 1.03 08/30/2010 0904      Component Value Date/Time   CALCIUM 9.6 08/30/2010 0904   CALCIUM 9.6 08/30/2010 0904   ALKPHOS 72 08/30/2010 0904   ALKPHOS 72 08/30/2010 0904   AST 22 08/30/2010 0904   AST 22 08/30/2010 0904   ALT 15 08/30/2010 0904   ALT 15 08/30/2010 0904   BILITOT 0.4 08/30/2010 0904   BILITOT 0.4 08/30/2010 0904       RADIOGRAPHIC STUDIES: No results found.  ASSESSMENT: This is a very pleasant 61 years old African female with reactive eosinophilia secondary to parasitic infections. The patient is doing fine after treatment for strongyloides. There is significant improvement in her any significant count. The patient continues to have mild lymphocytosis which could be secondary to her recent viral infection or an early chronic lymphocytic leukemia.  PLAN: I discussed the lab  result with the patient today. I recommended for her continuous observation with her primary care physician and Dr. Luciana Axe. I will see her on as-needed basis at this point. She was advised to call me immediately if she has any concerning symptoms or worsening of her lymphocytosis.  All questions were answered. The patient knows to call the clinic with any problems, questions or concerns. We can certainly see the patient much sooner if necessary.

## 2011-07-28 ENCOUNTER — Other Ambulatory Visit (INDEPENDENT_AMBULATORY_CARE_PROVIDER_SITE_OTHER): Payer: PRIVATE HEALTH INSURANCE

## 2011-07-28 LAB — CBC WITH DIFFERENTIAL/PLATELET
Basophils Absolute: 0 10*3/uL (ref 0.0–0.1)
HCT: 32.8 % — ABNORMAL LOW (ref 36.0–46.0)
Lymphocytes Relative: 69 % — ABNORMAL HIGH (ref 12–46)
Lymphs Abs: 4.5 10*3/uL — ABNORMAL HIGH (ref 0.7–4.0)
MCV: 82 fL (ref 78.0–100.0)
Monocytes Absolute: 0.4 10*3/uL (ref 0.1–1.0)
Neutro Abs: 1.2 10*3/uL — ABNORMAL LOW (ref 1.7–7.7)
RBC: 4 MIL/uL (ref 3.87–5.11)
RDW: 12.8 % (ref 11.5–15.5)
WBC: 6.5 10*3/uL (ref 4.0–10.5)

## 2011-08-01 ENCOUNTER — Ambulatory Visit: Payer: PRIVATE HEALTH INSURANCE | Admitting: Internal Medicine

## 2011-08-09 ENCOUNTER — Ambulatory Visit (INDEPENDENT_AMBULATORY_CARE_PROVIDER_SITE_OTHER): Payer: PRIVATE HEALTH INSURANCE | Admitting: Family Medicine

## 2011-08-09 ENCOUNTER — Ambulatory Visit: Payer: PRIVATE HEALTH INSURANCE

## 2011-08-09 VITALS — BP 130/72 | HR 62 | Temp 97.6°F | Resp 16 | Ht 62.0 in | Wt 120.0 lb

## 2011-08-09 DIAGNOSIS — M25519 Pain in unspecified shoulder: Secondary | ICD-10-CM

## 2011-08-09 DIAGNOSIS — R079 Chest pain, unspecified: Secondary | ICD-10-CM

## 2011-08-09 DIAGNOSIS — M549 Dorsalgia, unspecified: Secondary | ICD-10-CM

## 2011-08-09 MED ORDER — TRIAMCINOLONE ACETONIDE 40 MG/ML IJ SUSP: 40.0000 mg | Freq: Once | INTRAMUSCULAR | Status: DC

## 2011-08-09 MED ORDER — MELOXICAM 7.5 MG PO TABS: 7.5000 mg | ORAL_TABLET | Freq: Two times a day (BID) | ORAL | Status: AC

## 2011-08-09 NOTE — Progress Notes (Signed)
[facility]:  Office Visit  Chief Complaint:  Chief Complaint  Patient presents with  . Shoulder Pain    x Saturday r shoulder  . Back Pain    HPI: Heidi [name] is a 61 y.o. female who complains of  4 day history of left shoulder pain with radiation to left scapula and upper back. Squeezing pain. Has tried Tylenol without relief. HAs h/o bursitis previously 10 yearas ago. Denies SOB, URI sxs. Has asscociated pleuritic pain with deep breaths  Past Medical History  Diagnosis Date  . Allergy    Past Surgical History  Procedure Date  . Cesarean section    History   Social History  . Marital Status: Married    Spouse Name: N/A    Number of Children: N/A  . Years of Education: N/A   Social History Main Topics  . Smoking status: Never Smoker   . Smokeless tobacco: Never Used  . Alcohol Use: No  . Drug Use: No  . Sexually Active: Yes -- Female partner(s)   Other Topics Concern  . None   Social History Narrative  . None   No family history on file. No Known Allergies Prior to Admission medications   Medication Sig Start Date End Date Taking? Authorizing Provider  aspirin 81 MG tablet Take 81 mg by mouth daily.   Yes Historical Provider, MD  calcium-vitamin D (OSCAL WITH D) 500-200 MG-UNIT per tablet Take 1 tablet by mouth daily.   Yes Historical Provider, MD  ibuprofen (ADVIL,MOTRIN) 200 MG tablet Take 200 mg by mouth every 6 (six) hours as needed.   Yes Historical Provider, MD  Multiple Vitamin (MULTIVITAMIN) tablet Take 1 tablet by mouth daily.   Yes Historical Provider, MD  cetirizine (ZYRTEC) 5 MG tablet Take 5 mg by mouth as needed.     Historical Provider, MD     ROS: The patient denies fevers, chills, night sweats, unintentional weight loss, chest pain, palpitations, wheezing, dyspnea on exertion, nausea, vomiting, abdominal pain, dysuria, hematuria, melena, numbness, weakness, or tingling.   All other systems have been reviewed and were  otherwise negative with the exception of those mentioned in the HPI and as above.    PHYSICAL EXAM: Filed Vitals:   08/09/11 1745  BP: 130/72  Pulse: 62  Temp: 97.6 F (36.4 C)  Resp: 16   Filed Vitals:   08/09/11 1745  Height: 5\' 2"  (1.575 m)  Weight: 120 lb (54.432 kg)   Body mass index is 21.95 kg/(m^2).  General: Alert, no acute distress HEENT:  Normocephalic, atraumatic, oropharynx patent.  Cardiovascular:  Regular rate and rhythm, no rubs murmurs or gallops.  No Carotid bruits, radial pulse intact. No pedal edema.  Respiratory: Clear to auscultation bilaterally.  No wheezes, rales, or rhonchi.  No cyanosis, no use of accessory musculature GI: No organomegaly, abdomen is soft and non-tender, positive bowel sounds.  No masses. Skin: No rashes. Neurologic: Facial musculature symmetric. Psychiatric: Patient is appropriate throughout our interaction. Lymphatic: No cervical lymphadenopathy Musculoskeletal: Gait intact. Left shoulder-full PROM, decreased AROM, tender with adduction and  decrease with external rotation. Hawkins/NEErs equivicol. Spurling negative. 5/5 strength, sensation intact.  Slightly tender along medial aspect of shoulder blade. T-spine nl ROM   LABS:    EKG/XRAY:   Primary read interpreted by Dr. Conley Rolls at [facility]. CXR-negative for pneumo, infiltrates Shoulder-no fx/dislocation Thoracic-no fx/dislocation   ASSESSMENT/PLAN: Encounter Diagnoses  Name Primary?  . Shoulder pain Yes  . Chest pain   .  Back pain    ? Referred shoulder to back pain. Shoulder arthritis vs rotator cuff tendonitis Shoulder injection risk and benefits explained to patient and husband. She verbally consented to the procedure. Sterile technique used. Patient was injected with a total of 4 ml of mixutre of  1 ml of 40 mg kenalog, 2 ml of lidocaine without epi, and 2 ml of marcaine Procedure was tolerated well without complications Mobic rx given F/u as needed     LE, THAO  PHUONG, DO 08/09/2011 6:05 PM

## 2011-08-11 ENCOUNTER — Encounter: Payer: Self-pay | Admitting: Internal Medicine

## 2011-08-11 ENCOUNTER — Ambulatory Visit (INDEPENDENT_AMBULATORY_CARE_PROVIDER_SITE_OTHER): Payer: PRIVATE HEALTH INSURANCE | Admitting: Internal Medicine

## 2011-08-11 VITALS — BP 119/72 | HR 69 | Temp 97.4°F | Ht 61.0 in | Wt 120.0 lb

## 2011-08-11 NOTE — Progress Notes (Signed)
  Subjective:    Patient ID: Heidi [name], female    DOB: Jul 13, 1950, 61 y.o.   MRN: [id number]  HPI This patient comes in here for followup of her eosinophilia. She was first diagnosed with eosinophilia in the early 1990s in [place] where she grew up. She did have an evaluation at that time and was told it was likely allergic in nature. She then went to see oncology here who was concerned with hematologic process that was sent to me for evaluation to rule out any other parasitic infection related causes. She was evaluated and notable to have a strongyloidiasis antibody that was positive. She was then given a treatment for strongyloidiasis with ivermectin. Since treatment, she did notice significant improvement in her fatigue and overall feels much better. She was seen by oncology who did note an improvement in her eosinophilia and today returns 2 weeks after getting her CBC and her eosinophilia has essentially resolved. She over all does feel better.   Review of Systems  Constitutional: Negative for activity change, fatigue and unexpected weight change.  Gastrointestinal: Negative for nausea, abdominal pain, diarrhea and constipation.  Skin: Negative for rash.  Neurological: Negative for dizziness and light-headedness.       Objective:   Physical Exam  Constitutional: She appears well-developed and well-nourished. No distress.  Cardiovascular: Normal rate, regular rhythm and normal heart sounds.   Skin: No rash noted.          Assessment & Plan:

## 2011-08-11 NOTE — Assessment & Plan Note (Signed)
She did complete the treatment about 6 months ago with ivermectin for presumed strongyloidiasis. She did notice a difference in her energy level after completing the treatment. Her eosinophilia essentially has resolved. This appears to be in the diagnosis. I did recommend that she has yearly followups of her CBC with differential by her primary physician. She can return here at any time if there any new changes.

## 2012-01-05 ENCOUNTER — Telehealth: Payer: Self-pay | Admitting: Internal Medicine

## 2012-01-05 NOTE — Telephone Encounter (Signed)
Spouse called in to make a f/u appt for her as she has had new labs drawn from her pcp and needs  A f/u.  This is ok per sj and appt was made        anne

## 2012-01-16 ENCOUNTER — Telehealth: Payer: Self-pay | Admitting: Internal Medicine

## 2012-01-16 ENCOUNTER — Ambulatory Visit (HOSPITAL_BASED_OUTPATIENT_CLINIC_OR_DEPARTMENT_OTHER): Payer: PRIVATE HEALTH INSURANCE | Admitting: Internal Medicine

## 2012-01-16 ENCOUNTER — Encounter: Payer: Self-pay | Admitting: Internal Medicine

## 2012-01-16 ENCOUNTER — Ambulatory Visit (HOSPITAL_BASED_OUTPATIENT_CLINIC_OR_DEPARTMENT_OTHER): Payer: PRIVATE HEALTH INSURANCE | Admitting: Lab

## 2012-01-16 ENCOUNTER — Other Ambulatory Visit (HOSPITAL_COMMUNITY)
Admission: RE | Admit: 2012-01-16 | Discharge: 2012-01-16 | Disposition: A | Discharge status: Home / Self Care | Payer: PRIVATE HEALTH INSURANCE | Source: Ambulatory Visit | Attending: Internal Medicine | Admitting: Internal Medicine

## 2012-01-16 VITALS — BP 129/73 | HR 70 | Temp 97.7°F | Resp 20 | Ht 61.0 in | Wt 117.7 lb

## 2012-01-16 DIAGNOSIS — D72820 Lymphocytosis (symptomatic): Secondary | ICD-10-CM | POA: Insufficient documentation

## 2012-01-16 LAB — CBC WITH DIFFERENTIAL/PLATELET
BASO%: 1.1 % (ref 0.0–2.0)
Basophils Absolute: 0.1 10*3/uL (ref 0.0–0.1)
EOS%: 2.4 % (ref 0.0–7.0)
Eosinophils Absolute: 0.1 10*3/uL (ref 0.0–0.5)
HCT: 37 % (ref 34.8–46.6)
HGB: 12.3 g/dL (ref 11.6–15.9)
LYMPH%: 61.8 % — ABNORMAL HIGH (ref 14.0–49.7)
MCH: 28.4 pg (ref 25.1–34.0)
MCHC: 33.4 g/dL (ref 31.5–36.0)
MCV: 85.3 fL (ref 79.5–101.0)
MONO#: 0.4 10*3/uL (ref 0.1–0.9)
MONO%: 6.2 % (ref 0.0–14.0)
NEUT#: 1.8 10*3/uL (ref 1.5–6.5)
NEUT%: 28.5 % — ABNORMAL LOW (ref 38.4–76.8)
Platelets: 183 10*3/uL (ref 145–400)
RBC: 4.33 10*6/uL (ref 3.70–5.45)
RDW: 12.7 % (ref 11.2–14.5)
WBC: 6.3 10*3/uL (ref 3.9–10.3)
lymph#: 3.9 10*3/uL — ABNORMAL HIGH (ref 0.9–3.3)

## 2012-01-16 LAB — LACTATE DEHYDROGENASE (CC13): LDH: 183 U/L (ref 125–245)

## 2012-01-16 NOTE — Patient Instructions (Signed)
Your presenting with lymphocytosis questionable for chronic lymphocytic leukemia. I ordered several blood work today for evaluation of this condition. Continuous observation with repeat CBC in 3 months

## 2012-01-16 NOTE — Progress Notes (Signed)
[facility] [phone]   [phone]  OFFICE PROGRESS NOTE  REED, TIFFANY, DO [address]. 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 2) New presentation with lymphocytosis.  PRIOR THERAPY: Status post treatment with Ivermectin under the care of Dr. Luciana Axe.   CURRENT THERAPY: None.  INTERVAL HISTORY: Heidi [name] 62 y.o. female returns to the clinic today for followup visit. The patient was seen in the past for evaluation of eosinophilia as it was felt to be secondary to parasitic Strongyloides infection and is currently resolved. She was seen recently by her primary care physician and was found to have an elevated lymphocyte count. She was referred back to me for evaluation and recommendation regarding her lymphocytosis. The patient is feeling fine with no specific complaints. She denied having any significant chest pain or shortness breath, no cough or hemoptysis. She denied having any weight loss or night sweats and no palpable lymphadenopathy.  MEDICAL HISTORY: Past Medical History  Diagnosis Date  . Allergy     ALLERGIES:   has no known allergies.  MEDICATIONS:  Current Outpatient Prescriptions  Medication Sig Dispense Refill  . aspirin 81 MG tablet Take 81 mg by mouth daily.      . calcium-vitamin D (OSCAL WITH D) 500-200 MG-UNIT per tablet Take 1 tablet by mouth daily.      . cetirizine (ZYRTEC) 5 MG tablet Take 5 mg by mouth as needed.       Marland Kitchen ibuprofen (ADVIL,MOTRIN) 200 MG tablet Take 200 mg by mouth every 6 (six) hours as needed.      . meloxicam (MOBIC) 7.5 MG tablet Take 1 tablet (7.5 mg total) by mouth 2 (two) times daily.  30 tablet  1  . Multiple Vitamin (MULTIVITAMIN) tablet Take 1 tablet by mouth daily.       Current Facility-Administered Medications  Medication Dose Route Frequency Provider Last Rate Last Dose  . triamcinolone acetonide  (KENALOG-40) injection 40 mg  40 mg Intramuscular Once Thao P Le, DO        SURGICAL HISTORY:  Past Surgical History  Procedure Date  . Cesarean section     REVIEW OF SYSTEMS:  A comprehensive review of systems was negative.   PHYSICAL EXAMINATION: General appearance: alert, cooperative and no distress Head: Normocephalic, without obvious abnormality, atraumatic Neck: no adenopathy Lymph nodes: Cervical, supraclavicular, and axillary nodes normal. Resp: clear to auscultation bilaterally Cardio: regular rate and rhythm, S1, S2 normal, no murmur, click, rub or gallop GI: soft, non-tender; bowel sounds normal; no masses,  no organomegaly Extremities: extremities normal, atraumatic, no cyanosis or edema  ECOG PERFORMANCE STATUS: 1 - Symptomatic but completely ambulatory  Blood pressure 129/73, pulse 70, temperature 97.7 F (36.5 C), temperature source Oral, resp. rate 20, height 5\' 1"  (1.549 m), weight 117 lb 11.2 oz (53.388 kg).  LABORATORY DATA: Lab Results  Component Value Date   WBC 6.5 07/28/2011   HGB 11.4* 07/28/2011   HCT 32.8* 07/28/2011   MCV 82.0 07/28/2011   PLT 214 07/28/2011      Chemistry      Component Value Date/Time   NA 138 05/23/2011 1420   K 4.5 05/23/2011 1420   CL 105 05/23/2011 1420   CO2 28 05/23/2011 1420   BUN 13 05/23/2011 1420   CREATININE 0.90 05/23/2011 1420      Component Value Date/Time   CALCIUM 9.3 05/23/2011 1420   ALKPHOS 76  05/23/2011 1420   AST 21 05/23/2011 1420   ALT 19 05/23/2011 1420   BILITOT 0.3 05/23/2011 1420       RADIOGRAPHIC STUDIES: No results found.  ASSESSMENT: This is a very pleasant 62 years old African female presenting for evaluation of lymphocytosis suspicious for chronic lymphocytic leukemia, but the absolute lymphocyte count is less than 5000.  PLAN: I discussed the lab result with the patient today. I recommended for her to have repeat CBC, comprehensive metabolic panel, LDH as well as flow cytometry for chronic  lymphocytic leukemia today. I would see her back for followup visit in 3 months with repeat blood work. I will continue the patient on observation for now. She was advised to call me immediately she has any concerning symptoms in the interval.  All questions were answered. The patient knows to call the clinic with any problems, questions or concerns. We can certainly see the patient much sooner if necessary.

## 2012-01-16 NOTE — Telephone Encounter (Signed)
appts made and printed for pt aom,pt sent back to the lab     anne

## 2012-01-20 LAB — FLOW CYTOMETRY

## 2012-04-12 ENCOUNTER — Telehealth: Payer: Self-pay | Admitting: Internal Medicine

## 2012-04-12 NOTE — Telephone Encounter (Signed)
pt called to r/s appt due to work schedule pt has every ohter monday off...r/s Done...pt ok and aware

## 2012-04-16 ENCOUNTER — Other Ambulatory Visit: Payer: PRIVATE HEALTH INSURANCE | Admitting: Lab

## 2012-04-16 ENCOUNTER — Ambulatory Visit: Payer: PRIVATE HEALTH INSURANCE | Admitting: Internal Medicine

## 2012-04-23 ENCOUNTER — Telehealth: Payer: Self-pay | Admitting: Internal Medicine

## 2012-04-23 ENCOUNTER — Encounter: Payer: Self-pay | Admitting: Lab

## 2012-04-23 ENCOUNTER — Ambulatory Visit (HOSPITAL_BASED_OUTPATIENT_CLINIC_OR_DEPARTMENT_OTHER): Payer: PRIVATE HEALTH INSURANCE | Admitting: Internal Medicine

## 2012-04-23 ENCOUNTER — Other Ambulatory Visit (HOSPITAL_BASED_OUTPATIENT_CLINIC_OR_DEPARTMENT_OTHER): Payer: PRIVATE HEALTH INSURANCE | Admitting: Lab

## 2012-04-23 ENCOUNTER — Encounter: Payer: Self-pay | Admitting: Internal Medicine

## 2012-04-23 VITALS — BP 124/79 | HR 63 | Temp 97.2°F | Resp 18 | Ht 61.0 in | Wt 118.3 lb

## 2012-04-23 DIAGNOSIS — D72820 Lymphocytosis (symptomatic): Secondary | ICD-10-CM

## 2012-04-23 LAB — LACTATE DEHYDROGENASE (CC13): LDH: 189 U/L (ref 125–245)

## 2012-04-23 LAB — CBC WITH DIFFERENTIAL/PLATELET
EOS%: 3 % (ref 0.0–7.0)
MCH: 28.1 pg (ref 25.1–34.0)
MCV: 85.2 fL (ref 79.5–101.0)
MONO%: 7.3 % (ref 0.0–14.0)
NEUT#: 2.1 10*3/uL (ref 1.5–6.5)
RBC: 4.26 10*6/uL (ref 3.70–5.45)
RDW: 13.1 % (ref 11.2–14.5)
lymph#: 4.4 10*3/uL — ABNORMAL HIGH (ref 0.9–3.3)

## 2012-04-23 LAB — COMPREHENSIVE METABOLIC PANEL (CC13)
ALT: 17 U/L (ref 0–55)
AST: 20 U/L (ref 5–34)
Alkaline Phosphatase: 84 U/L (ref 40–150)
Potassium: 4.4 mEq/L (ref 3.5–5.1)
Sodium: 141 mEq/L (ref 136–145)
Total Bilirubin: <0.2 mg/dL (ref 0.20–1.20)
Total Protein: 7.7 g/dL (ref 6.4–8.3)

## 2012-04-23 NOTE — Patient Instructions (Signed)
No significant change in your blood work. Followup visit in 6 months with repeat CBC, comprehensive metabolic panel and LDH.

## 2012-04-23 NOTE — Progress Notes (Signed)
[facility] [phone]   [phone]  OFFICE PROGRESS NOTE  REED, TIFFANY, DO [address]. 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  2) New presentation with lymphocytosis,.   PRIOR THERAPY: Status post treatment with Ivermectin under the care of Dr. Luciana Axe.   CURRENT THERAPY: Observation.  INTERVAL HISTORY: Laurieann Nogales 62 y.o. female returns to the clinic today for three-month followup visit accompanied by her husband. The patient is feeling fine today with no specific complaints. She denied having any significant chest pain, shortness of breath, cough or hemoptysis. She has no significant weight loss or night sweats. She has no peripheral lymphadenopathy. She had repeat CBC performed earlier today and she is here for evaluation and discussion of her lab results.   MEDICAL HISTORY: Past Medical History  Diagnosis Date  . Allergy     ALLERGIES:  has No Known Allergies.  MEDICATIONS:  Current Outpatient Prescriptions  Medication Sig Dispense Refill  . aspirin 81 MG tablet Take 81 mg by mouth daily.      . calcium-vitamin D (OSCAL WITH D) 500-200 MG-UNIT per tablet Take 1 tablet by mouth daily.      . cetirizine (ZYRTEC) 5 MG tablet Take 5 mg by mouth as needed.       Marland Kitchen ibuprofen (ADVIL,MOTRIN) 200 MG tablet Take 200 mg by mouth every 6 (six) hours as needed.      . meloxicam (MOBIC) 7.5 MG tablet Take 1 tablet (7.5 mg total) by mouth 2 (two) times daily.  30 tablet  1  . Multiple Vitamin (MULTIVITAMIN) tablet Take 1 tablet by mouth daily.       Current Facility-Administered Medications  Medication Dose Route Frequency Provider Last Rate Last Dose  . triamcinolone acetonide (KENALOG-40) injection 40 mg  40 mg Intramuscular Once Thao P Le, DO        SURGICAL HISTORY:  Past Surgical History  Procedure Laterality Date  . Cesarean section      REVIEW OF  SYSTEMS:  A comprehensive review of systems was negative.   PHYSICAL EXAMINATION: General appearance: alert, cooperative and no distress Head: Normocephalic, without obvious abnormality, atraumatic Neck: no adenopathy Lymph nodes: Cervical, supraclavicular, and axillary nodes normal. Resp: clear to auscultation bilaterally Cardio: regular rate and rhythm, S1, S2 normal, no murmur, click, rub or gallop GI: soft, non-tender; bowel sounds normal; no masses,  no organomegaly Extremities: extremities normal, atraumatic, no cyanosis or edema  ECOG PERFORMANCE STATUS: 0 - Asymptomatic  There were no vitals taken for this visit.  LABORATORY DATA: Lab Results  Component Value Date   WBC 7.3 04/23/2012   HGB 12.0 04/23/2012   HCT 36.3 04/23/2012   MCV 85.2 04/23/2012   PLT 193 04/23/2012      Chemistry      Component Value Date/Time   NA 138 05/23/2011 1420   K 4.5 05/23/2011 1420   CL 105 05/23/2011 1420   CO2 28 05/23/2011 1420   BUN 13 05/23/2011 1420   CREATININE 0.90 05/23/2011 1420      Component Value Date/Time   CALCIUM 9.3 05/23/2011 1420   ALKPHOS 76 05/23/2011 1420   AST 21 05/23/2011 1420   ALT 19 05/23/2011 1420   BILITOT 0.3 05/23/2011 1420       RADIOGRAPHIC STUDIES: No results found.  ASSESSMENT: This is a very pleasant 62 years old African female with history of lymphocytosis that did not  meet the criteria for chronic lymphocytic leukemia because the total number of lymphocytes was less than 500. His CBC today is unremarkable except for the persistent lymphocytosis.  PLAN: I discussed the lab result with the patient and her husband. I recommended for her to continue on observation with repeat CBC, comprehensive metabolic panel and LDH in 6 months. She was advised to call me immediately if she has any concerning symptoms in the interval. All questions were answered. The patient knows to call the clinic with any problems, questions or concerns. We can certainly see the  patient much sooner if necessary.

## 2012-04-23 NOTE — Telephone Encounter (Signed)
gv pt appt schedule for October.  °

## 2012-06-14 ENCOUNTER — Other Ambulatory Visit: Payer: Self-pay | Admitting: *Deleted

## 2012-06-14 ENCOUNTER — Other Ambulatory Visit: Payer: PRIVATE HEALTH INSURANCE

## 2012-06-14 DIAGNOSIS — E119 Type 2 diabetes mellitus without complications: Secondary | ICD-10-CM

## 2012-06-14 DIAGNOSIS — D649 Anemia, unspecified: Secondary | ICD-10-CM

## 2012-06-14 DIAGNOSIS — E785 Hyperlipidemia, unspecified: Secondary | ICD-10-CM

## 2012-06-15 ENCOUNTER — Encounter: Payer: Self-pay | Admitting: *Deleted

## 2012-06-15 LAB — CBC WITH DIFFERENTIAL/PLATELET
Basophils Absolute: 0 10*3/uL (ref 0.0–0.2)
Basos: 1 % (ref 0–3)
Eos: 2 % (ref 0–5)
Eosinophils Absolute: 0.1 10*3/uL (ref 0.0–0.4)
HCT: 38.9 % (ref 34.0–46.6)
Hemoglobin: 12.8 g/dL (ref 11.1–15.9)
Immature Grans (Abs): 0 10*3/uL (ref 0.0–0.1)
Immature Granulocytes: 0 % (ref 0–2)
Lymphocytes Absolute: 3.9 10*3/uL — ABNORMAL HIGH (ref 0.7–3.1)
Lymphs: 68 % — ABNORMAL HIGH (ref 14–46)
MCH: 28.2 pg (ref 26.6–33.0)
MCHC: 32.9 g/dL (ref 31.5–35.7)
MCV: 86 fL (ref 79–97)
Monocytes Absolute: 0.3 10*3/uL (ref 0.1–0.9)
Monocytes: 5 % (ref 4–12)
Neutrophils Absolute: 1.4 10*3/uL (ref 1.4–7.0)
Neutrophils Relative %: 24 % — ABNORMAL LOW (ref 40–74)
RBC: 4.54 x10E6/uL (ref 3.77–5.28)
RDW: 13.6 % (ref 12.3–15.4)
WBC: 5.7 10*3/uL (ref 3.4–10.8)

## 2012-06-15 LAB — COMPREHENSIVE METABOLIC PANEL
ALT: 23 IU/L (ref 0–32)
AST: 20 IU/L (ref 0–40)
Albumin/Globulin Ratio: 1.3 (ref 1.1–2.5)
Albumin: 4.4 g/dL (ref 3.6–4.8)
Alkaline Phosphatase: 75 IU/L (ref 39–117)
BUN/Creatinine Ratio: 16 (ref 11–26)
BUN: 16 mg/dL (ref 8–27)
CO2: 28 mmol/L (ref 19–28)
Calcium: 9.5 mg/dL (ref 8.6–10.2)
Chloride: 102 mmol/L (ref 97–108)
Creatinine, Ser: 0.98 mg/dL (ref 0.57–1.00)
GFR calc Af Amer: 72 mL/min/{1.73_m2} (ref >59)
GFR calc non Af Amer: 62 mL/min/{1.73_m2} (ref >59)
Globulin, Total: 3.5 g/dL (ref 1.5–4.5)
Glucose: 81 mg/dL (ref 65–99)
Potassium: 4 mmol/L (ref 3.5–5.2)
Sodium: 139 mmol/L (ref 134–144)
Total Bilirubin: 0.3 mg/dL (ref 0.0–1.2)
Total Protein: 7.9 g/dL (ref 6.0–8.5)

## 2012-06-15 LAB — MICROALBUMIN / CREATININE URINE RATIO
Creatinine, Ur: 50.4 mg/dL (ref 15.0–328.0)
MICROALB/CREAT RATIO: 4 mg/g creat (ref 0.0–30.0)
Microalbumin, Urine: 2 ug/mL (ref 0.0–17.0)

## 2012-06-15 LAB — LIPID PANEL
Chol/HDL Ratio: 2.8 ratio (ref 0.0–4.4)
Cholesterol, Total: 216 mg/dL — ABNORMAL HIGH (ref 100–199)
HDL: 76 mg/dL
LDL Calculated: 131 mg/dL — ABNORMAL HIGH (ref 0–99)
Triglycerides: 46 mg/dL (ref 0–149)
VLDL Cholesterol Cal: 9 mg/dL (ref 5–40)

## 2012-06-15 LAB — HEMOGLOBIN A1C
Est. average glucose Bld gHb Est-mCnc: 117 mg/dL
Hgb A1c MFr Bld: 5.7 % — ABNORMAL HIGH (ref 4.8–5.6)

## 2012-06-18 ENCOUNTER — Encounter: Payer: Self-pay | Admitting: Internal Medicine

## 2012-06-18 ENCOUNTER — Ambulatory Visit (INDEPENDENT_AMBULATORY_CARE_PROVIDER_SITE_OTHER): Payer: PRIVATE HEALTH INSURANCE | Admitting: Internal Medicine

## 2012-06-18 VITALS — BP 110/70 | HR 80 | Temp 99.0°F | Resp 14 | Ht 61.0 in | Wt 116.6 lb

## 2012-06-18 DIAGNOSIS — M899 Disorder of bone, unspecified: Secondary | ICD-10-CM | POA: Insufficient documentation

## 2012-06-18 DIAGNOSIS — D649 Anemia, unspecified: Secondary | ICD-10-CM

## 2012-06-18 DIAGNOSIS — E119 Type 2 diabetes mellitus without complications: Secondary | ICD-10-CM | POA: Insufficient documentation

## 2012-06-18 DIAGNOSIS — K2100 Gastro-esophageal reflux disease with esophagitis, without bleeding: Secondary | ICD-10-CM

## 2012-06-18 DIAGNOSIS — D72820 Lymphocytosis (symptomatic): Secondary | ICD-10-CM

## 2012-06-18 DIAGNOSIS — M949 Disorder of cartilage, unspecified: Secondary | ICD-10-CM

## 2012-06-18 DIAGNOSIS — E785 Hyperlipidemia, unspecified: Secondary | ICD-10-CM

## 2012-06-18 DIAGNOSIS — IMO0001 Reserved for inherently not codable concepts without codable children: Secondary | ICD-10-CM

## 2012-06-18 NOTE — Patient Instructions (Signed)
Keep up the good work with your diet and exercise!

## 2012-06-18 NOTE — Assessment & Plan Note (Signed)
Following with hematology.  She is asymptomatic.  Has f/u labs in October with hematology.

## 2012-06-18 NOTE — Assessment & Plan Note (Signed)
No recent symptoms.  Has stopped mobic as shoulder pain resolved.

## 2012-06-18 NOTE — Progress Notes (Signed)
Patient ID: Heidi [name], female   DOB: November 27, 1950, 62 y.o.   MRN: [id number] Location:  [facility] / [place] [facility]  Code Status: full code  No Known Allergies  Chief Complaint  Patient presents with  . Follow-up    Labs   HPI: Patient is a 62 y.o. female seen in the office today for f/u of her chronic medical conditions.  Doing well with diet.  Eats friends' home food and eats chicken.  Avoids sweets.    Dr. Arbutus Ped continues to monitor her lymphocyte counts.  They remain relatively elevated today.    Review of Systems:  Review of Systems  Constitutional: Positive for weight loss.       Down 5 lbs with diet  Respiratory: Negative for shortness of breath.   Cardiovascular: Negative for chest pain.  Gastrointestinal: Negative for constipation.  Genitourinary: Negative for dysuria and hematuria.  Musculoskeletal: Negative for joint pain.       Left shoulder pain has resolved so no longer uses mobic  Skin: Negative for rash.  Neurological: Negative for tingling.       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  Psychiatric/Behavioral: Positive for depression.    Past Medical History  Diagnosis Date  . Allergy   . Lymphocytosis (symptomatic)   . Disorder of bone and cartilage, unspecified   . Eosinophilia   . Type II or unspecified type diabetes mellitus without mention of complication, uncontrolled   . Unspecified disorder of liver   . Pain in joint, shoulder region   . Reflux esophagitis   . Anemia, unspecified   . Other and unspecified hyperlipidemia   . Allergic rhinitis due to pollen   . Other abnormal blood chemistry   . Umbilical hernia without mention of obstruction or gangrene    Past Surgical History  Procedure Laterality Date  . Cesarean section     Social History:   reports that she has never smoked. She has never used smokeless  tobacco. She reports that she does not drink alcohol or use illicit drugs.  No family history on file.  Medications: Patient's Medications  New Prescriptions   No medications on file  Previous Medications   ASPIRIN 81 MG TABLET    Take 81 mg by mouth daily.   CALCIUM-VITAMIN D (OSCAL WITH D) 500-200 MG-UNIT PER TABLET    Take 1 tablet by mouth daily.   CETIRIZINE (ZYRTEC) 5 MG TABLET    Take 5 mg by mouth as needed.    MELOXICAM (MOBIC) 7.5 MG TABLET    Take 1 tablet (7.5 mg total) by mouth 2 (two) times daily.   MULTIPLE VITAMIN (MULTIVITAMIN) TABLET    Take 1 tablet by mouth daily.  Modified Medications   No medications on file  Discontinued Medications   No medications on file   Physical Exam: Filed Vitals:   06/18/12 0842  BP: 110/70  Pulse: 80  Temp: 99 F (37.2 C)  TempSrc: Oral  Resp: 14  Height: 5\' 1"  (1.549 m)  Weight: 116 lb 9.6 oz (52.889 kg)  SpO2: 99%  Physical Exam  Labs reviewed: Basic Metabolic Panel:  Recent Labs  78/29/56 1302 06/14/12 0847  NA 141 139  K 4.4 4.0  CL 108* 102  CO2 25 28  GLUCOSE 88 81  BUN 14.0 16  CREATININE 1.1 0.98  CALCIUM 9.2 9.5   Liver Function Tests:  Recent Labs  04/23/12 1302 06/14/12 0847  AST 20 20  ALT 17 23  ALKPHOS 84 75  BILITOT <0.20 Repeated and Verified 0.3  PROT 7.7 7.9  ALBUMIN 3.5  --   CBC:  Recent Labs  07/28/11 1546 01/16/12 1308 04/23/12 1302 06/14/12 0847  WBC 6.5 6.3 7.3 5.7  NEUTROABS 1.2* 1.8 2.1 1.4  HGB 11.4* 12.3 12.0 12.8  HCT 32.8* 37.0 36.3 38.9  MCV 82.0 85.3 85.2 86  PLT 214 183 193  --    Lipid Panel:  Recent Labs  06/14/12 0847  HDL 76  LDLCALC 131*  TRIG 46  CHOLHDL 2.8   Lab Results  Component Value Date   HGBA1C 5.7* 06/14/2012    Past Procedures: MM 01/30/12 latest normal Bone density 01/28/11 osteopenia  Assessment/Plan Other and unspecified hyperlipidemia LDL is elevated, but HDL is very good and protective.  She eats a healthy, well-balanced  diet and is active.    Anemia, unspecified Stable.  Follows with Dr. Arbutus Ped.  Reflux esophagitis No recent symptoms.  Has stopped mobic as shoulder pain resolved.  Type II or unspecified type diabetes mellitus without mention of complication, uncontrolled Is improving.  Is really just hyperglycemic, but previously was diabetic in old records.  Doing well with diet.    Disorder of bone and cartilage, unspecified Last bone density 01/28/11 with osteopenia.  Is to be taking calcium with D.  Has some constipation issues.  After visit, I noted she did not say she is taking this.    Lymphocytosis Following with hematology.  She is asymptomatic.  Has f/u labs in October with hematology.   Labs/tests ordered: hba1c, cbc with diff, bmp before next visit  Next appt: 6 mos

## 2012-06-18 NOTE — Assessment & Plan Note (Signed)
Last bone density 01/28/11 with osteopenia.  Is to be taking calcium with D.  Has some constipation issues.  After visit, I noted she did not say she is taking this.

## 2012-06-18 NOTE — Assessment & Plan Note (Signed)
Is improving.  Is really just hyperglycemic, but previously was diabetic in old records.  Doing well with diet.

## 2012-06-18 NOTE — Assessment & Plan Note (Signed)
Stable.  Follows with Dr. Arbutus Ped.

## 2012-06-18 NOTE — Assessment & Plan Note (Signed)
LDL is elevated, but HDL is very good and protective.  She eats a healthy, well-balanced diet and is active.

## 2012-08-21 ENCOUNTER — Emergency Department (HOSPITAL_COMMUNITY)
Admission: EM | Admit: 2012-08-21 | Discharge: 2012-08-21 | Disposition: A | Discharge status: Home / Self Care | Payer: No Typology Code available for payment source | Attending: Emergency Medicine | Admitting: Emergency Medicine

## 2012-08-21 ENCOUNTER — Encounter (HOSPITAL_COMMUNITY): Payer: Self-pay | Admitting: *Deleted

## 2012-08-21 DIAGNOSIS — Z7982 Long term (current) use of aspirin: Secondary | ICD-10-CM | POA: Insufficient documentation

## 2012-08-21 DIAGNOSIS — S46909A Unspecified injury of unspecified muscle, fascia and tendon at shoulder and upper arm level, unspecified arm, initial encounter: Secondary | ICD-10-CM | POA: Insufficient documentation

## 2012-08-21 DIAGNOSIS — Y92410 Unspecified street and highway as the place of occurrence of the external cause: Secondary | ICD-10-CM | POA: Insufficient documentation

## 2012-08-21 DIAGNOSIS — S4980XA Other specified injuries of shoulder and upper arm, unspecified arm, initial encounter: Secondary | ICD-10-CM | POA: Insufficient documentation

## 2012-08-21 DIAGNOSIS — M25561 Pain in right knee: Secondary | ICD-10-CM

## 2012-08-21 DIAGNOSIS — Z8739 Personal history of other diseases of the musculoskeletal system and connective tissue: Secondary | ICD-10-CM | POA: Insufficient documentation

## 2012-08-21 DIAGNOSIS — Y9389 Activity, other specified: Secondary | ICD-10-CM | POA: Insufficient documentation

## 2012-08-21 DIAGNOSIS — M25511 Pain in right shoulder: Secondary | ICD-10-CM

## 2012-08-21 DIAGNOSIS — IMO0001 Reserved for inherently not codable concepts without codable children: Secondary | ICD-10-CM | POA: Insufficient documentation

## 2012-08-21 DIAGNOSIS — S8990XA Unspecified injury of unspecified lower leg, initial encounter: Secondary | ICD-10-CM | POA: Insufficient documentation

## 2012-08-21 DIAGNOSIS — M549 Dorsalgia, unspecified: Secondary | ICD-10-CM

## 2012-08-21 DIAGNOSIS — Z8719 Personal history of other diseases of the digestive system: Secondary | ICD-10-CM | POA: Insufficient documentation

## 2012-08-21 DIAGNOSIS — Z862 Personal history of diseases of the blood and blood-forming organs and certain disorders involving the immune mechanism: Secondary | ICD-10-CM | POA: Insufficient documentation

## 2012-08-21 DIAGNOSIS — Z79899 Other long term (current) drug therapy: Secondary | ICD-10-CM | POA: Insufficient documentation

## 2012-08-21 DIAGNOSIS — Z8639 Personal history of other endocrine, nutritional and metabolic disease: Secondary | ICD-10-CM | POA: Insufficient documentation
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
## 2012-08-23 ENCOUNTER — Encounter: Payer: Self-pay | Admitting: Internal Medicine

## 2012-08-23 ENCOUNTER — Ambulatory Visit (INDEPENDENT_AMBULATORY_CARE_PROVIDER_SITE_OTHER): Payer: PRIVATE HEALTH INSURANCE | Admitting: Internal Medicine

## 2012-08-23 DIAGNOSIS — R202 Paresthesia of skin: Secondary | ICD-10-CM

## 2012-08-23 DIAGNOSIS — R209 Unspecified disturbances of skin sensation: Secondary | ICD-10-CM

## 2012-08-23 DIAGNOSIS — M25519 Pain in unspecified shoulder: Secondary | ICD-10-CM

## 2012-08-23 DIAGNOSIS — M25511 Pain in right shoulder: Secondary | ICD-10-CM

## 2012-08-23 DIAGNOSIS — R200 Anesthesia of skin: Secondary | ICD-10-CM

## 2012-08-23 MED ORDER — METAXALONE 800 MG PO TABS: 800.0000 mg | ORAL_TABLET | Freq: Three times a day (TID) | ORAL | Status: DC
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
## 2012-08-26 NOTE — ED Provider Notes (Signed)
Medical screening examination/treatment/procedure(s) were performed by non-physician practitioner and as supervising physician I was immediately available for consultation/collaboration.   Keymiah Lyles M Leiby Pigeon, MD 08/26/12 0750 

## 2012-09-13 ENCOUNTER — Encounter: Payer: Self-pay | Admitting: Gastroenterology

## 2012-10-18 ENCOUNTER — Telehealth: Payer: Self-pay | Admitting: Internal Medicine

## 2012-10-18 NOTE — Telephone Encounter (Signed)
pt called to r/s appt...done °

## 2012-10-22 ENCOUNTER — Other Ambulatory Visit: Payer: PRIVATE HEALTH INSURANCE | Admitting: Lab

## 2012-10-22 ENCOUNTER — Ambulatory Visit: Payer: PRIVATE HEALTH INSURANCE | Admitting: Internal Medicine

## 2012-11-07 ENCOUNTER — Encounter: Payer: Self-pay | Admitting: Gastroenterology

## 2012-11-16 ENCOUNTER — Other Ambulatory Visit: Payer: Self-pay | Admitting: *Deleted

## 2012-11-16 DIAGNOSIS — D72820 Lymphocytosis (symptomatic): Secondary | ICD-10-CM

## 2012-11-19 ENCOUNTER — Encounter: Payer: Self-pay | Admitting: Physician Assistant

## 2012-11-19 ENCOUNTER — Ambulatory Visit (HOSPITAL_BASED_OUTPATIENT_CLINIC_OR_DEPARTMENT_OTHER): Payer: BC Managed Care – PPO | Admitting: Physician Assistant

## 2012-11-19 ENCOUNTER — Other Ambulatory Visit (HOSPITAL_BASED_OUTPATIENT_CLINIC_OR_DEPARTMENT_OTHER): Payer: BC Managed Care – PPO | Admitting: Lab

## 2012-11-19 VITALS — BP 116/72 | HR 57 | Temp 97.5°F | Resp 18 | Ht 61.0 in | Wt 119.5 lb

## 2012-11-19 DIAGNOSIS — D72820 Lymphocytosis (symptomatic): Secondary | ICD-10-CM

## 2012-11-19 LAB — COMPREHENSIVE METABOLIC PANEL (CC13)
ALT: 17 U/L (ref 0–55)
AST: 23 U/L (ref 5–34)
Anion Gap: 7 mEq/L (ref 3–11)
CO2: 27 mEq/L (ref 22–29)
Calcium: 9.2 mg/dL (ref 8.4–10.4)
Chloride: 105 mEq/L (ref 98–109)
Sodium: 138 mEq/L (ref 136–145)
Total Bilirubin: 0.33 mg/dL (ref 0.20–1.20)
Total Protein: 7.7 g/dL (ref 6.4–8.3)

## 2012-11-19 LAB — CBC WITH DIFFERENTIAL/PLATELET
BASO%: 1.2 % (ref 0.0–2.0)
MCHC: 32.2 g/dL (ref 31.5–36.0)
MONO#: 0.6 10*3/uL (ref 0.1–0.9)
RBC: 4.34 10*6/uL (ref 3.70–5.45)
RDW: 12.8 % (ref 11.2–14.5)
WBC: 6.9 10*3/uL (ref 3.9–10.3)
lymph#: 3.5 10*3/uL — ABNORMAL HIGH (ref 0.9–3.3)

## 2012-11-19 NOTE — Patient Instructions (Signed)
We are releasing you back to your primary care physician for further followup. We are available on should she need our expertise in the future

## 2012-11-19 NOTE — Progress Notes (Addendum)
[facility] Telephone:([phone]) [phone]   Fax:([phone]) [phone]  SHARED VISIT PROGRESS NOTE  REED, TIFFANY, DO [address] 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  2) New presentation with lymphocytosis,.   PRIOR THERAPY: Status post treatment with Ivermectin under the care of Dr. Luciana Axe.   CURRENT THERAPY: Observation.  INTERVAL HISTORY: Heidi [name] 62 y.o. female returns to the clinic today for six-month followup visit. The patient is feeling fine today with no specific complaints. She denied having any significant chest pain, shortness of breath, cough or hemoptysis. She has no significant weight loss or night sweats. She has no peripheral lymphadenopathy. She had repeat CBC performed earlier today and she is here for evaluation and discussion of her lab results.   MEDICAL HISTORY: Past Medical History  Diagnosis Date  . Allergy   . Lymphocytosis (symptomatic)   . Disorder of bone and cartilage, unspecified   . Eosinophilia   . Type II or unspecified type diabetes mellitus without mention of complication, uncontrolled   . Unspecified disorder of liver   . Pain in joint, shoulder region   . Reflux esophagitis   . Anemia, unspecified   . Other and unspecified hyperlipidemia   . Allergic rhinitis due to pollen   . Other abnormal blood chemistry   . Umbilical hernia without mention of obstruction or gangrene     ALLERGIES:  has No Known Allergies.  MEDICATIONS:  Current Outpatient Prescriptions  Medication Sig Dispense Refill  . aspirin EC 81 MG tablet Take 81 mg by mouth daily.      . calcium-vitamin D (OSCAL WITH D) 500-200 MG-UNIT per tablet Take 1 tablet by mouth daily.      . cetirizine (ZYRTEC) 5 MG tablet Take 5 mg by mouth daily as needed for allergies.       . Multiple Vitamin (MULTIVITAMIN) tablet Take 1 tablet by mouth daily.      . metaxalone (SKELAXIN) 800 MG  tablet Take 1 tablet (800 mg total) by mouth 3 (three) times daily.  30 tablet  0   Current Facility-Administered Medications  Medication Dose Route Frequency Provider Last Rate Last Dose  . triamcinolone acetonide (KENALOG-40) injection 40 mg  40 mg Intramuscular Once Thao P Le, DO        SURGICAL HISTORY:  Past Surgical History  Procedure Laterality Date  . Cesarean section      REVIEW OF SYSTEMS:  A comprehensive review of systems was negative.   PHYSICAL EXAMINATION: General appearance: alert, cooperative and no distress Head: Normocephalic, without obvious abnormality, atraumatic Neck: no adenopathy Lymph nodes: Cervical, supraclavicular, and axillary nodes normal. Resp: clear to auscultation bilaterally Cardio: regular rate and rhythm, S1, S2 normal, no murmur, click, rub or gallop GI: soft, non-tender; bowel sounds normal; no masses,  no organomegaly Extremities: extremities normal, atraumatic, no cyanosis or edema  ECOG PERFORMANCE STATUS: 0 - Asymptomatic  Blood pressure 116/72, pulse 57, temperature 97.5 F (36.4 C), temperature source Oral, resp. rate 18, height 5\' 1"  (1.549 m), weight 119 lb 8 oz (54.205 kg).  LABORATORY DATA: Lab Results  Component Value Date   WBC 6.9 11/19/2012   HGB 12.0 11/19/2012   HCT 37.2 11/19/2012   MCV 85.8 11/19/2012   PLT 212 11/19/2012      Chemistry      Component Value Date/Time   NA 138 11/19/2012 1251   NA 139 06/14/2012 0847  NA 138 05/23/2011 1420   K 3.8 11/19/2012 1251   K 4.0 06/14/2012 0847   CL 102 06/14/2012 0847   CL 108* 04/23/2012 1302   CO2 27 11/19/2012 1251   CO2 28 06/14/2012 0847   BUN 13.7 11/19/2012 1251   BUN 16 06/14/2012 0847   BUN 13 05/23/2011 1420   CREATININE 0.9 11/19/2012 1251   CREATININE 0.98 06/14/2012 0847      Component Value Date/Time   CALCIUM 9.2 11/19/2012 1251   CALCIUM 9.5 06/14/2012 0847   ALKPHOS 73 11/19/2012 1251   ALKPHOS 75 06/14/2012 0847   AST 23 11/19/2012 1251   AST 20  06/14/2012 0847   ALT 17 11/19/2012 1251   ALT 23 06/14/2012 0847   BILITOT 0.33 11/19/2012 1251   BILITOT 0.3 06/14/2012 0847       RADIOGRAPHIC STUDIES: No results found.  ASSESSMENT: This is a very pleasant 62 years old African female with history of lymphocytosis that did not meet the criteria for chronic lymphocytic leukemia because the total number of lymphocytes was less than 500. Her CBC today is unremarkable except for the persistent lymphocytosis. 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   PLAN: Patient was discussed with also seen by Dr. Arbutus Ped. Her counts are well within normal range. At this point we will release her to her primary care physician for further followup. We will are of course available to the patient the future should she need our expertise.  Dhanvin Szeto E, PA-C  She was advised to call me immediately if she has any concerning symptoms in the interval. All questions were answered. The patient knows to call the clinic with any problems, questions or concerns. We can certainly see the patient much sooner if necessary.  ADDENDUM: Hematology/Oncology Attending: I had a face to face encounter with the patient today. I recommended her care plan. The patient is doing fine today with no specific complaints. Her CBC showed no significant increase in the lymphocytes or total white blood count. I recommended for the patient to continue on observation with routine follow up visit with her primary care physician. I would be happy to see her in the future on an as-needed basis. Lajuana Matte., MD 11/19/2012

## 2012-11-26 ENCOUNTER — Other Ambulatory Visit: Payer: BC Managed Care – PPO

## 2012-11-27 LAB — HEMOGLOBIN A1C
Est. average glucose Bld gHb Est-mCnc: 117 mg/dL
Hgb A1c MFr Bld: 5.7 % — ABNORMAL HIGH (ref 4.8–5.6)

## 2012-12-10 ENCOUNTER — Encounter: Payer: Self-pay | Admitting: Gastroenterology

## 2012-12-10 ENCOUNTER — Ambulatory Visit (INDEPENDENT_AMBULATORY_CARE_PROVIDER_SITE_OTHER): Payer: BC Managed Care – PPO | Admitting: Gastroenterology

## 2012-12-10 ENCOUNTER — Ambulatory Visit: Payer: PRIVATE HEALTH INSURANCE | Admitting: Internal Medicine

## 2012-12-10 VITALS — BP 100/60 | HR 72 | Ht 60.24 in | Wt 119.5 lb

## 2012-12-10 DIAGNOSIS — Z1211 Encounter for screening for malignant neoplasm of colon: Secondary | ICD-10-CM

## 2012-12-10 NOTE — Addendum Note (Signed)
Addended by: Marlowe Kays on: 12/10/2012 02:27 PM   Modules accepted: Orders

## 2012-12-10 NOTE — Progress Notes (Signed)
History of Present Illness:  Pleasant 62 year old [name] referred for screening colonoscopy.  Last exam 10 years ago demonstrated a very nonspecific colitis involving all but the cecum.  Patient was asymptomatic.  She is feeling well at the present time and has  no GI complaints including abdominal pain, change of bowel habits, melena or rectal bleeding.    Past Medical History  Diagnosis Date  . Allergy   . Lymphocytosis (symptomatic)   . Disorder of bone and cartilage, unspecified   . Eosinophilia   . Type II or unspecified type diabetes mellitus without mention of complication, uncontrolled   . Unspecified disorder of liver   . Pain in joint, shoulder region   . Reflux esophagitis   . Anemia, unspecified   . Other and unspecified hyperlipidemia   . Allergic rhinitis due to pollen   . Other abnormal blood chemistry   . Umbilical hernia without mention of obstruction or gangrene    Past Surgical History  Procedure Laterality Date  . Cesarean section     family history is not on file. Current Outpatient Prescriptions  Medication Sig Dispense Refill  . aspirin EC 81 MG tablet Take 81 mg by mouth daily.      . calcium-vitamin D (OSCAL WITH D) 500-200 MG-UNIT per tablet Take 1 tablet by mouth daily.      . cetirizine (ZYRTEC) 5 MG tablet Take 5 mg by mouth daily as needed for allergies.       . Multiple Vitamin (MULTIVITAMIN) tablet Take 1 tablet by mouth daily.       Current Facility-Administered Medications  Medication Dose Route Frequency Provider Last Rate Last Dose  . triamcinolone acetonide (KENALOG-40) injection 40 mg  40 mg Intramuscular Once Thao P Le, DO       Allergies as of 12/10/2012  . (No Known Allergies)    reports that she has never smoked. She has never used smokeless tobacco. She reports that she does not drink alcohol or use illicit drugs.     Review of Systems: Pertinent positive and negative review of systems were noted in the above HPI  section. All other review of systems were otherwise negative.  Vital signs were reviewed in today's medical record Physical Exam: General: Well developed , well nourished, no acute distress Skin: anicteric Head: Normocephalic and atraumatic Eyes:  sclerae anicteric, EOMI Ears: Normal auditory acuity Mouth: No deformity or lesions Neck: Supple, no masses or thyromegaly Lungs: Clear throughout to auscultation Heart: Regular rate and rhythm; no murmurs, rubs or bruits Abdomen: Soft, non tender and non distended. No masses, hepatosplenomegaly or hernias noted. Normal Bowel sounds Rectal:deferred Musculoskeletal: Symmetrical with no gross deformities  Skin: No lesions on visible extremities Pulses:  Normal pulses noted Extremities: No clubbing, cyanosis, edema or deformities noted Neurological: Alert oriented x 4, grossly nonfocal Cervical Nodes:  No significant cervical adenopathy Inguinal Nodes: No significant inguinal adenopathy Psychological:  Alert and cooperative. Normal mood and affect

## 2012-12-10 NOTE — Patient Instructions (Signed)

## 2012-12-10 NOTE — Assessment & Plan Note (Signed)
Patient will be scheduled for colonoscopy.  A mild colitis was incidentally noted at previous  colonoscopy.  Patient has no symptoms.

## 2012-12-31 ENCOUNTER — Encounter: Payer: Self-pay | Admitting: Internal Medicine

## 2012-12-31 ENCOUNTER — Ambulatory Visit (INDEPENDENT_AMBULATORY_CARE_PROVIDER_SITE_OTHER): Payer: BC Managed Care – PPO | Admitting: Internal Medicine

## 2012-12-31 VITALS — BP 126/70 | HR 70 | Temp 97.4°F | Wt 118.6 lb

## 2012-12-31 DIAGNOSIS — IMO0001 Reserved for inherently not codable concepts without codable children: Secondary | ICD-10-CM

## 2012-12-31 DIAGNOSIS — K2100 Gastro-esophageal reflux disease with esophagitis, without bleeding: Secondary | ICD-10-CM

## 2012-12-31 DIAGNOSIS — Z23 Encounter for immunization: Secondary | ICD-10-CM

## 2012-12-31 DIAGNOSIS — D72820 Lymphocytosis (symptomatic): Secondary | ICD-10-CM

## 2012-12-31 DIAGNOSIS — D649 Anemia, unspecified: Secondary | ICD-10-CM

## 2012-12-31 NOTE — Patient Instructions (Signed)
prevnar given 
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
## 2013-01-03 HISTORY — PX: COLONOSCOPY: SHX174

## 2013-01-30 LAB — HM PAP SMEAR

## 2013-01-31 ENCOUNTER — Encounter: Payer: Self-pay | Admitting: *Deleted

## 2013-02-06 ENCOUNTER — Encounter: Payer: Self-pay | Admitting: Internal Medicine

## 2013-02-06 ENCOUNTER — Ambulatory Visit (AMBULATORY_SURGERY_CENTER): Payer: BC Managed Care – PPO | Admitting: Gastroenterology

## 2013-02-06 ENCOUNTER — Encounter: Payer: Self-pay | Admitting: Gastroenterology

## 2013-02-06 VITALS — BP 93/64 | HR 60 | Temp 97.7°F | Resp 20 | Ht 60.0 in | Wt 119.0 lb

## 2013-02-06 DIAGNOSIS — Z1211 Encounter for screening for malignant neoplasm of colon: Secondary | ICD-10-CM

## 2013-02-06 MED ORDER — SODIUM CHLORIDE 0.9 % IV SOLN: 500.0000 mL | INTRAVENOUS | Status: DC

## 2013-02-06 NOTE — Patient Instructions (Signed)
YOU HAD AN ENDOSCOPIC PROCEDURE TODAY AT THE [facility]: Refer to the procedure report that was given to you for any specific questions about what was found during the examination.  If the procedure report does not answer your questions, please call your gastroenterologist to clarify.  If you requested that your care partner not be given the details of your procedure findings, then the procedure report has been included in a sealed envelope for you to review at your convenience later.  YOU SHOULD EXPECT: Some feelings of bloating in the abdomen. Passage of more gas than usual.  Walking can help get rid of the air that was put into your GI tract during the procedure and reduce the bloating. If you had a lower endoscopy (such as a colonoscopy or flexible sigmoidoscopy) you may notice spotting of blood in your stool or on the toilet paper. If you underwent a bowel prep for your procedure, then you may not have a normal bowel movement for a few days.  DIET: Your first meal following the procedure should be a light meal and then it is ok to progress to your normal diet.  A half-sandwich or bowl of soup is an example of a good first meal.  Heavy or fried foods are harder to digest and may make you feel nauseous or bloated.  Likewise meals heavy in dairy and vegetables can cause extra gas to form and this can also increase the bloating.  Drink plenty of fluids but you should avoid alcoholic beverages for 24 hours.  ACTIVITY: Your care partner should take you home directly after the procedure.  You should plan to take it easy, moving slowly for the rest of the day.  You can resume normal activity the day after the procedure however you should NOT DRIVE or use heavy machinery for 24 hours (because of the sedation medicines used during the test).    SYMPTOMS TO REPORT IMMEDIATELY: A gastroenterologist can be reached at any hour.  During normal business hours, 8:30 AM to 5:00 PM Monday through Friday,  call [phone].  After hours and on weekends, please call the GI answering service at [phone] who will take a message and have the physician on call contact you.   Following lower endoscopy (colonoscopy or flexible sigmoidoscopy):  Excessive amounts of blood in the stool  Significant tenderness or worsening of abdominal pains  Swelling of the abdomen that is new, acute  Fever of 100F or higher   FOLLOW UP: If any biopsies were taken you will be contacted by phone or by letter within the next 1-3 weeks.  Call your gastroenterologist if you have not heard about the biopsies in 3 weeks.  Our staff will call the home number listed on your records the next business day following your procedure to check on you and address any questions or concerns that you may have at that time regarding the information given to you following your procedure. This is a courtesy call and so if there is no answer at the home number and we have not heard from you through the emergency physician on call, we will assume that you have returned to your regular daily activities without incident.  SIGNATURES/CONFIDENTIALITY: You and/or your care partner have signed paperwork which will be entered into your electronic medical record.  These signatures attest to the fact that that the information above on your After Visit Summary has been reviewed and is understood.  Full responsibility of the confidentiality of   this discharge information lies with you and/or your care-partner.  Normal exam  Repeat colonoscopy in 10 years.   

## 2013-02-06 NOTE — Progress Notes (Signed)
A/ox3 pleased with MAC, report to April RN 

## 2013-02-06 NOTE — Op Note (Signed)
[facility] [address] [place], [id number]   COLONOSCOPY PROCEDURE REPORT  PATIENT: Heidi [name], Heidi [name]  MR#: [id number] BIRTHDATE: 08/20/1950 , 62  yrs. old GENDER: Female ENDOSCOPIST: Louis Meckelobert D Grier Vu, MD REFERRED GE:XBMWUXLBY:Tiffany Reed, M.D. PROCEDURE DATE:  02/06/2013 PROCEDURE:   Colonoscopy, diagnostic First Screening Colonoscopy - Avg.  risk and is 50 yrs.  old or older Yes.  Prior Negative Screening - Now for repeat screening. N/A  History of Adenoma - Now for follow-up colonoscopy & has been > or = to 3 yrs.  N/A  Polyps Removed Today? No.  Recommend repeat exam, <10 yrs? No. ASA CLASS:   Class II INDICATIONS:average risk screening. MEDICATIONS: MAC sedation, administered by CRNA and Propofol (Diprivan) 140 mg IV  DESCRIPTION OF PROCEDURE:   After the risks benefits and alternatives of the procedure were thoroughly explained, informed consent was obtained.  A digital rectal exam revealed no abnormalities of the rectum.   The LB [id number]  endoscope was introduced through the anus and advanced to the cecum, which was identified by both the appendix and ileocecal valve. No adverse events experienced.   The quality of the prep was excellent using Suprep  The instrument was then slowly withdrawn as the colon was fully examined.      COLON FINDINGS: A normal appearing cecum, ileocecal valve, and appendiceal orifice were identified.  The ascending, hepatic flexure, transverse, splenic flexure, descending, sigmoid colon and rectum appeared unremarkable.  No polyps or cancers were seen. Retroflexed views revealed no abnormalities. The time to cecum=3 minutes 25 seconds.  Withdrawal time=6 minutes 44 seconds.  The scope was withdrawn and the procedure completed. COMPLICATIONS: There were no complications.  ENDOSCOPIC IMPRESSION: Normal colon  RECOMMENDATIONS: Continue current colorectal screening recommendations for "routine risk" patients with a  repeat colonoscopy in 10 years.   eSigned:  Louis Meckelobert D Nyasha Rahilly, MD 02/06/2013 9:16 AM   cc:

## 2013-02-07 ENCOUNTER — Telehealth: Payer: Self-pay | Admitting: *Deleted

## 2013-02-07 NOTE — Telephone Encounter (Signed)
  Follow up Call-  Call back number 02/06/2013  Post procedure Call Back phone  # [phone]  Permission to leave phone message Yes     Patient questions:  Do you have a fever, pain , or abdominal swelling? no Pain Score  0 *  Have you tolerated food without any problems? yes  Have you been able to return to your normal activities? yes  Do you have any questions about your discharge instructions: Diet   no Medications  no Follow up visit  no  Do you have questions or concerns about your Care? no  Actions: * If pain score is 4 or above: No action needed, pain <4.

## 2013-02-25 ENCOUNTER — Ambulatory Visit (INDEPENDENT_AMBULATORY_CARE_PROVIDER_SITE_OTHER): Payer: BC Managed Care – PPO | Admitting: Internal Medicine

## 2013-02-25 ENCOUNTER — Encounter: Payer: Self-pay | Admitting: Internal Medicine

## 2013-02-25 VITALS — BP 122/78 | HR 69 | Wt 118.6 lb

## 2013-02-25 DIAGNOSIS — R928 Other abnormal and inconclusive findings on diagnostic imaging of breast: Secondary | ICD-10-CM

## 2013-02-25 NOTE — Progress Notes (Signed)
Patient ID: Heidi [name], female   DOB: 08/17/1950, 63 y.o.   MRN: [id number]   Location:  [facility] [place]  Code Status: full code   No Known Allergies  Chief Complaint  Patient presents with  . Follow-up    Discuss mammogram results     HPI: Patient is a 63 y.o. female seen in the office today for f/u on abnormal mammogram on 01/30/13.  She had more detailed pictures.  Was told she had a cyst in the left breast.  Not cancer.  I only received the initial study--[facility] sent us the f/u study.  See results below.  Fortunately, benign findings with recommendation for routine screening mammogram in 1 year.  Review of Systems:  Review of Systems  Cardiovascular: Negative for chest pain.       No palpable nodules, tenderness, pain, drainage from breasts     Past Medical History  Diagnosis Date  . Allergy   . Lymphocytosis (symptomatic)   . Disorder of bone and cartilage, unspecified   . Eosinophilia   . Type II or unspecified type diabetes mellitus without mention of complication, uncontrolled   . Unspecified disorder of liver   . Pain in joint, shoulder region   . Reflux esophagitis   . Anemia, unspecified   . Other and unspecified hyperlipidemia   . Allergic rhinitis due to pollen   . Other abnormal blood chemistry   . Umbilical hernia without mention of obstruction or gangrene     Past Surgical History  Procedure Laterality Date  . Cesarean section      Social History:   reports that she has never smoked. She has never used smokeless tobacco. She reports that she does not drink alcohol or use illicit drugs.  History reviewed. No pertinent family history.  Medications: Patient's Medications  New Prescriptions   No medications on file  Previous Medications   ASPIRIN EC 81 MG TABLET    Take 81 mg by mouth daily.   CALCIUM-VITAMIN D (OSCAL WITH D) 500-200 MG-UNIT PER TABLET    Take 1 tablet by mouth daily.   CETIRIZINE (ZYRTEC) 5  MG TABLET    Take 5 mg by mouth daily as needed for allergies.    MULTIPLE VITAMIN (MULTIVITAMIN) TABLET    Take 1 tablet by mouth daily.  Modified Medications   No medications on file  Discontinued Medications   No medications on file     Physical Exam: Filed Vitals:   02/25/13 1340  BP: 122/78  Pulse: 69  Weight: 118 lb 9.6 oz (53.797 kg)  SpO2: 95%  Physical Exam  Constitutional: She appears well-developed and well-nourished. No distress.  Cardiovascular: Normal rate, regular rhythm, normal heart sounds and intact distal pulses.   Pulmonary/Chest: Effort normal and breath sounds normal. No respiratory distress.  Psychiatric:  Anxious today about results    Labs reviewed: Basic Metabolic Panel:  Recent Labs  11/91/4704/21/14 1302 06/14/12 0847 11/19/12 1251  NA 141 139 138  K 4.4 4.0 3.8  CL 108* 102  --   CO2 25 28 27   GLUCOSE 88 81 80  BUN 14.0 16 13.7  CREATININE 1.1 0.98 0.9  CALCIUM 9.2 9.5 9.2   Liver Function Tests:  Recent Labs  04/23/12 1302 06/14/12 0847 11/19/12 1251  AST 20 20 23   ALT 17 23 17   ALKPHOS 84 75 73  BILITOT <0.20 Repeated and Verified 0.3 0.33  PROT 7.7 7.9 7.7  ALBUMIN 3.5  --  3.8  CBC:  Recent Labs  04/23/12 1302 06/14/12 0847 11/19/12 1251  WBC 7.3 5.7 6.9  NEUTROABS 2.1 1.4 2.6  HGB 12.0 12.8 12.0  HCT 36.3 38.9 37.2  MCV 85.2 86 85.8  PLT 193  --  212   Lipid Panel:  Recent Labs  06/14/12 0847  HDL 76  LDLCALC 131*  TRIG 46  CHOLHDL 2.8   Lab Results  Component Value Date   HGBA1C 5.7* 11/26/2012   Past Procedures: Bilateral diagnostic mammogram with cad 01/30/13:  Incomplete.  Needs additional imaging evaluation.  New focal asymmetry in left breast is indeterminate.  Mediolateral, spot magnification and exaggerated CC views are recommended.  Unilateral left diagnostic mammogram 02/07/13: Benign.  No mammographic evidence of malignancy.  A 1 year screening mammogram is recommended.  Assessment/Plan 1.  Abnormal screening mammogram -repeat suggestive of tissue being overlapped on the original study, but no concerning findings present -screening mammogram again in 2/16  Next appt: as scheduled

## 2013-02-26 ENCOUNTER — Encounter: Payer: Self-pay | Admitting: Internal Medicine

## 2013-02-27 ENCOUNTER — Encounter: Payer: Self-pay | Admitting: Internal Medicine

## 2013-04-22 ENCOUNTER — Encounter: Payer: Self-pay | Admitting: Internal Medicine

## 2013-06-06 ENCOUNTER — Encounter: Payer: Self-pay | Admitting: Nurse Practitioner

## 2013-06-06 ENCOUNTER — Ambulatory Visit (INDEPENDENT_AMBULATORY_CARE_PROVIDER_SITE_OTHER): Payer: BC Managed Care – PPO | Admitting: Nurse Practitioner

## 2013-06-06 VITALS — BP 108/66 | HR 64 | Temp 97.7°F | Wt 119.0 lb

## 2013-06-06 DIAGNOSIS — M940 Chondrocostal junction syndrome [Tietze]: Secondary | ICD-10-CM

## 2013-06-06 MED ORDER — MELOXICAM 7.5 MG PO TABS: 7.5000 mg | ORAL_TABLET | Freq: Every day | ORAL | Status: DC

## 2013-06-06 NOTE — Patient Instructions (Addendum)
meloxicam has been sent to your pharmacy- do NOT take with ibuprofen  May use tylenol 325 mg 2 tablets every 6 hours as needed for break through pain Use heat if this helps Rest Notify us if medication does not help pain or pain become worse   Costochondritis Costochondritis, sometimes called Tietze syndrome, is a swelling and irritation (inflammation) of the tissue (cartilage) that connects your ribs with your breastbone (sternum). It causes pain in the chest and rib area. Costochondritis usually goes away on its own over time. It can take up to 6 weeks or longer to get better, especially if you are unable to limit your activities. CAUSES  Some cases of costochondritis have no known cause. Possible causes include:  Injury (trauma).  Exercise or activity such as lifting.  Severe coughing. SIGNS AND SYMPTOMS  Pain and tenderness in the chest and rib area.  Pain that gets worse when coughing or taking deep breaths.  Pain that gets worse with specific movements. DIAGNOSIS  Your health care provider will do a physical exam and ask about your symptoms. Chest X-rays or other tests may be done to rule out other problems. TREATMENT  Costochondritis usually goes away on its own over time. Your health care provider may prescribe medicine to help relieve pain. HOME CARE INSTRUCTIONS   Avoid exhausting physical activity. Try not to strain your ribs during normal activity. This would include any activities using chest, abdominal, and side muscles, especially if heavy weights are used.  Apply ice to the affected area for the first 2 days after the pain begins.  Put ice in a plastic bag.  Place a towel between your skin and the bag.  Leave the ice on for 20 minutes, 2 3 times a day.  Only take over-the-counter or prescription medicines as directed by your health care provider. SEEK MEDICAL CARE IF:  You have redness or swelling at the rib joints. These are signs of infection.  Your pain  does not go away despite rest or medicine. SEEK IMMEDIATE MEDICAL CARE IF:   Your pain increases or you are very uncomfortable.  You have shortness of breath or difficulty breathing.  You cough up blood.  You have worse chest pains, sweating, or vomiting.  You have a fever or persistent symptoms for more than 2 3 days.  You have a fever and your symptoms suddenly get worse. MAKE SURE YOU:   Understand these instructions.  Will watch your condition.  Will get help right away if you are not doing well or get worse. Document Released: 09/29/2004 Document Revised: 10/10/2012 Document Reviewed: 07/24/2012 [facility] Patient Information 2014 [place], [place].

## 2013-06-06 NOTE — Progress Notes (Signed)
Patient ID: Heidi [name], female   DOB: July 25, 1950, 63 y.o.   MRN: [id number]    No Known Allergies  Chief Complaint  Patient presents with  . Pain    Patient c/o pain under right breast and under right arm off/on. Pain onset x 3 days ago, patient was moving arm and pain onset with movement     HPI: Patient is a 63 y.o. female seen in the office today for pain,  Moved hand over head and felt something pull under breast and since then she has had pain that has been getting worse. Was using warm compresses which helped then she switched to ice and it got worse. ibuprofen twice daily for the past few days. Pain is no better.  Sharp dull pain. More intense when touch area. Worse with movement  Review of Systems:  Review of Systems  Constitutional: Negative for fever and chills.  Eyes: Negative for blurred vision.  Respiratory: Negative for cough and shortness of breath.   Cardiovascular: Negative for chest pain.  Gastrointestinal: Negative for diarrhea and constipation.  Genitourinary: Negative for dysuria.  Musculoskeletal: Positive for myalgias (to chest area).  Skin: Negative for itching and rash.  Neurological: Negative for dizziness.  Endo/Heme/Allergies: Does not bruise/bleed easily.  Psychiatric/Behavioral: Negative for depression and memory loss.     Past Medical History  Diagnosis Date  . Allergy   . Lymphocytosis (symptomatic)   . Disorder of bone and cartilage, unspecified   . Eosinophilia   . Type II or unspecified type diabetes mellitus without mention of complication, uncontrolled   . Unspecified disorder of liver   . Pain in joint, shoulder region   . Reflux esophagitis   . Anemia, unspecified   . Other and unspecified hyperlipidemia   . Allergic rhinitis due to pollen   . Other abnormal blood chemistry   . Umbilical hernia without mention of obstruction or gangrene    Past Surgical History  Procedure Laterality Date  . Cesarean section     Social  History:   reports that she has never smoked. She has never used smokeless tobacco. She reports that she does not drink alcohol or use illicit drugs.  History reviewed. No pertinent family history.  Medications: Patient's Medications  New Prescriptions   No medications on file  Previous Medications   ALENDRONATE (FOSAMAX) 70 MG TABLET    Take 70 mg by mouth once a week. Take with a full glass of water on an empty stomach. Rx'ed by GYN   ASPIRIN EC 81 MG TABLET    Take 81 mg by mouth daily.   CALCIUM-VITAMIN D (OSCAL WITH D) 500-200 MG-UNIT PER TABLET    Take 1 tablet by mouth daily.   CETIRIZINE (ZYRTEC) 5 MG TABLET    Take 5 mg by mouth daily as needed for allergies.    MULTIPLE VITAMIN (MULTIVITAMIN) TABLET    Take 1 tablet by mouth daily.  Modified Medications   No medications on file  Discontinued Medications   No medications on file     Physical Exam:  Filed Vitals:   06/06/13 1451  BP: 108/66  Pulse: 64  Temp: 97.7 F (36.5 C)  TempSrc: Oral  Weight: 119 lb (53.978 kg)  SpO2: 93%    Physical Exam  Constitutional: She is oriented to person, place, and time and well-developed, well-nourished, and in no distress.  Cardiovascular: Normal rate, regular rhythm and normal heart sounds.   Pulmonary/Chest: Effort normal and breath sounds normal. No respiratory distress.  Abdominal: Soft. Bowel sounds are normal. She exhibits no distension.  Musculoskeletal: She exhibits tenderness.  Tenderness in between ribs under breast on right and pain follows around to side, no pain in back  Worse when she deep breaths and certain movement with right arm. no shortness of breath   Neurological: She is alert and oriented to person, place, and time.  Skin: Skin is warm and dry. No rash noted. No erythema. No pallor.  Psychiatric: Affect normal.     Labs reviewed: Basic Metabolic Panel:  Recent Labs  19/14/7804/12/16 0847 11/19/12 1251  NA 139 138  K 4.0 3.8  CL 102  --   CO2 28 27    GLUCOSE 81 80  BUN 16 13.7  CREATININE 0.98 0.9  CALCIUM 9.5 9.2   Liver Function Tests:  Recent Labs  06/14/12 0847 11/19/12 1251  AST 20 23  ALT 23 17  ALKPHOS 75 73  BILITOT 0.3 0.33  PROT 7.9 7.7  ALBUMIN  --  3.8   No results found for this basename: LIPASE, AMYLASE,  in the last 8760 hours No results found for this basename: AMMONIA,  in the last 8760 hours CBC:  Recent Labs  06/14/12 0847 11/19/12 1251  WBC 5.7 6.9  NEUTROABS 1.4 2.6  HGB 12.8 12.0  HCT 38.9 37.2  MCV 86 85.8  PLT  --  212   Lipid Panel:  Recent Labs  06/14/12 0847  HDL 76  LDLCALC 131*  TRIG 46  CHOLHDL 2.8   TSH: No results found for this basename: TSH,  in the last 8760 hours A1C: Lab Results  Component Value Date   HGBA1C 5.7* 11/26/2012     Assessment/Plan  1. Costochondritis Pain after overextending arm, felt something pull and pop, now with increased pain -to cont heat as this has helped  -pt took mobic in the past which helped in the past, will give Rx for this for 30 day supply - meloxicam (MOBIC) 7.5 MG tablet; Take 1 tablet (7.5 mg total) by mouth daily.  Dispense: 30 tablet; Refill: 0 -rest and no heavy lifting  -pt to keep follow up with Dr Renato Gailseed in 3 weeks  -return precautions discussed

## 2013-06-25 ENCOUNTER — Other Ambulatory Visit: Payer: BC Managed Care – PPO

## 2013-06-25 DIAGNOSIS — D649 Anemia, unspecified: Secondary | ICD-10-CM

## 2013-06-25 DIAGNOSIS — K2100 Gastro-esophageal reflux disease with esophagitis, without bleeding: Secondary | ICD-10-CM

## 2013-06-25 DIAGNOSIS — E1165 Type 2 diabetes mellitus with hyperglycemia: Principal | ICD-10-CM

## 2013-06-25 DIAGNOSIS — IMO0001 Reserved for inherently not codable concepts without codable children: Secondary | ICD-10-CM

## 2013-06-25 DIAGNOSIS — D72820 Lymphocytosis (symptomatic): Secondary | ICD-10-CM

## 2013-06-26 LAB — CBC WITH DIFFERENTIAL/PLATELET
Basophils Absolute: 0 10*3/uL (ref 0.0–0.2)
Basos: 0 %
Eos: 2 %
Eosinophils Absolute: 0.1 10*3/uL (ref 0.0–0.4)
HCT: 37.6 % (ref 34.0–46.6)
Hemoglobin: 12.8 g/dL (ref 11.1–15.9)
Immature Grans (Abs): 0 10*3/uL (ref 0.0–0.1)
Immature Granulocytes: 0 %
Lymphocytes Absolute: 3.6 10*3/uL — ABNORMAL HIGH (ref 0.7–3.1)
Lymphs: 67 %
MCH: 28.4 pg (ref 26.6–33.0)
MCHC: 34 g/dL (ref 31.5–35.7)
MCV: 83 fL (ref 79–97)
Monocytes Absolute: 0.4 10*3/uL (ref 0.1–0.9)
Monocytes: 7 %
Neutrophils Absolute: 1.3 10*3/uL — ABNORMAL LOW (ref 1.4–7.0)
Neutrophils Relative %: 24 %
RBC: 4.51 x10E6/uL (ref 3.77–5.28)
RDW: 13.5 % (ref 12.3–15.4)
WBC: 5.3 10*3/uL (ref 3.4–10.8)

## 2013-06-26 LAB — COMPREHENSIVE METABOLIC PANEL
ALT: 18 IU/L (ref 0–32)
AST: 22 IU/L (ref 0–40)
Albumin/Globulin Ratio: 1.4 (ref 1.1–2.5)
Albumin: 4.3 g/dL (ref 3.6–4.8)
Alkaline Phosphatase: 68 IU/L (ref 39–117)
BUN/Creatinine Ratio: 14 (ref 11–26)
BUN: 14 mg/dL (ref 8–27)
CO2: 20 mmol/L (ref 18–29)
Calcium: 9.4 mg/dL (ref 8.7–10.3)
Chloride: 104 mmol/L (ref 97–108)
Creatinine, Ser: 0.98 mg/dL (ref 0.57–1.00)
GFR calc Af Amer: 71 mL/min/{1.73_m2} (ref >59)
GFR calc non Af Amer: 62 mL/min/{1.73_m2} (ref >59)
Globulin, Total: 3.1 g/dL (ref 1.5–4.5)
Glucose: 81 mg/dL (ref 65–99)
Potassium: 4.7 mmol/L (ref 3.5–5.2)
Sodium: 140 mmol/L (ref 134–144)
Total Bilirubin: 0.3 mg/dL (ref 0.0–1.2)
Total Protein: 7.4 g/dL (ref 6.0–8.5)

## 2013-06-26 LAB — HEMOGLOBIN A1C
Est. average glucose Bld gHb Est-mCnc: 126 mg/dL
Hgb A1c MFr Bld: 6 % — ABNORMAL HIGH (ref 4.8–5.6)

## 2013-06-26 LAB — LIPID PANEL
Chol/HDL Ratio: 3.4 ratio units (ref 0.0–4.4)
Cholesterol, Total: 220 mg/dL — ABNORMAL HIGH (ref 100–199)
HDL: 65 mg/dL (ref >39)
LDL Calculated: 145 mg/dL — ABNORMAL HIGH (ref 0–99)
Triglycerides: 51 mg/dL (ref 0–149)
VLDL Cholesterol Cal: 10 mg/dL (ref 5–40)

## 2013-06-27 ENCOUNTER — Ambulatory Visit (INDEPENDENT_AMBULATORY_CARE_PROVIDER_SITE_OTHER): Payer: BC Managed Care – PPO | Admitting: Internal Medicine

## 2013-06-27 ENCOUNTER — Encounter: Payer: Self-pay | Admitting: Internal Medicine

## 2013-06-27 VITALS — BP 114/78 | HR 68 | Temp 97.8°F | Wt 116.0 lb

## 2013-06-27 DIAGNOSIS — K2100 Gastro-esophageal reflux disease with esophagitis, without bleeding: Secondary | ICD-10-CM

## 2013-06-27 DIAGNOSIS — IMO0001 Reserved for inherently not codable concepts without codable children: Secondary | ICD-10-CM

## 2013-06-27 DIAGNOSIS — M899 Disorder of bone, unspecified: Secondary | ICD-10-CM

## 2013-06-27 DIAGNOSIS — M949 Disorder of cartilage, unspecified: Secondary | ICD-10-CM

## 2013-06-27 DIAGNOSIS — E1165 Type 2 diabetes mellitus with hyperglycemia: Principal | ICD-10-CM

## 2013-06-27 DIAGNOSIS — D72820 Lymphocytosis (symptomatic): Secondary | ICD-10-CM

## 2013-06-27 NOTE — Progress Notes (Signed)
Patient ID: Heidi [name], female   DOB: 09/20/1950, 63 y.o.   MRN: [id number]   Location:  [facility]  Code Status: full code  No Known Allergies  Chief Complaint  Patient presents with  . Medical Management of Chronic Issues    blood sugar, anemia, Lymphocytosis, 6 month follow-up    HPI: Patient is a 63 y.o. AA female seen in the office today for medical mgt of chronic diseases.    Has no concerns.  Had pulled muscle on right side by lifting her right arm up over her head.  Had pain over ribs on right side, but pain resolved.  Had taken mobic for a week, then stopped.  Was started on fosamax two months ago due to bone density decline despite ca with D (had done in April 2015).  Review of Systems:  Review of Systems  Constitutional: Negative for fever and malaise/fatigue.  HENT: Negative for congestion and hearing loss.   Eyes: Negative for blurred vision.       Has eye appt to f/u on cataract surgery due to lens problems--having trouble reading small print--sees Dr. Elmer PickerHecker  Respiratory: Negative for shortness of breath.   Cardiovascular: Negative for chest pain and leg swelling.  Gastrointestinal: Negative for heartburn, diarrhea, constipation, blood in stool and melena.  Genitourinary: Negative for dysuria.  Musculoskeletal: Negative for falls and myalgias.  Skin: Negative for rash.  Neurological: Negative for dizziness, loss of consciousness and headaches.  Endo/Heme/Allergies: Does not bruise/bleed easily.  Psychiatric/Behavioral: Negative for depression and memory loss.     Past Medical History  Diagnosis Date  . Allergy   . Lymphocytosis (symptomatic)   . Disorder of bone and cartilage, unspecified   . Eosinophilia   . Type II or unspecified type diabetes mellitus without mention of complication, uncontrolled   . Unspecified disorder of liver   . Pain in joint, shoulder region   . Reflux esophagitis   . Anemia,  unspecified   . Other and unspecified hyperlipidemia   . Allergic rhinitis due to pollen   . Other abnormal blood chemistry   . Umbilical hernia without mention of obstruction or gangrene     Past Surgical History  Procedure Laterality Date  . Cesarean section      Social History:   reports that she has never smoked. She has never used smokeless tobacco. She reports that she does not drink alcohol or use illicit drugs.  History reviewed. No pertinent family history.  Medications: Patient's Medications  New Prescriptions   No medications on file  Previous Medications   ALENDRONATE (FOSAMAX) 70 MG TABLET    Take 70 mg by mouth once a week. Take with a full glass of water on an empty stomach. Rx'ed by GYN   ASPIRIN EC 81 MG TABLET    Take 81 mg by mouth daily.   CALCIUM-VITAMIN D (OSCAL WITH D) 500-200 MG-UNIT PER TABLET    Take 1 tablet by mouth daily.   CETIRIZINE (ZYRTEC) 5 MG TABLET    Take 5 mg by mouth daily as needed for allergies.    MELOXICAM (MOBIC) 7.5 MG TABLET    Take 1 tablet (7.5 mg total) by mouth daily.   MULTIPLE VITAMIN (MULTIVITAMIN) TABLET    Take 1 tablet by mouth daily.  Modified Medications   No medications on file  Discontinued Medications   No medications on file     Physical Exam: Filed Vitals:   06/27/13 [id number]  BP: 114/78  Pulse: 68  Temp: 97.8 F (36.6 C)  TempSrc: Oral  Weight: 116 lb (52.617 kg)  Physical Exam  Constitutional: She is oriented to person, place, and time. She appears well-developed and well-nourished. No distress.  Cardiovascular: Normal rate, regular rhythm, normal heart sounds and intact distal pulses.   Pulmonary/Chest: Effort normal and breath sounds normal. No respiratory distress.  Abdominal: Soft. Bowel sounds are normal. She exhibits no distension and no mass. There is no tenderness.  Musculoskeletal: Normal range of motion. She exhibits no edema and no tenderness.  Neurological: She is alert and oriented to person,  place, and time.  Skin: Skin is warm and dry.  Psychiatric: She has a normal mood and affect.    Labs reviewed: Basic Metabolic Panel:  Recent Labs  16/10/9609/17/14 1251 06/25/13 0846  NA 138 140  K 3.8 4.7  CL  --  104  CO2 27 20  GLUCOSE 80 81  BUN 13.7 14  CREATININE 0.9 0.98  CALCIUM 9.2 9.4   Liver Function Tests:  Recent Labs  11/19/12 1251 06/25/13 0846  AST 23 22  ALT 17 18  ALKPHOS 73 68  BILITOT 0.33 0.3  PROT 7.7 7.4  ALBUMIN 3.8  --   CBC:  Recent Labs  11/19/12 1251 06/25/13 0846  WBC 6.9 5.3  NEUTROABS 2.6 1.3*  HGB 12.0 12.8  HCT 37.2 37.6  MCV 85.8 83  PLT 212  --    Lipid Panel:  Recent Labs  06/25/13 0846  HDL 65  LDLCALC 145*  TRIG 51  CHOLHDL 3.4   Lab Results  Component Value Date   HGBA1C 6.0* 06/25/2013   Assessment/Plan 1. Type II or unspecified type diabetes mellitus without mention of complication, uncontrolled - well controlled with diet and exercise -cont this and bulgar wheat in place of rice which has helped maintain this and her LDL - Comprehensive metabolic panel; Future - Lipid panel; Future - Hemoglobin A1c; Future  2. Reflux esophagitis -no symptoms lately  3. Disorder of bone and cartilage, unspecified -now on fosamax due to decline in bone density since last check (gyn prescribed) -cont ca with D  4. Lymphocytosis - wbc total normal, lymphocytes remain elevated - CBC With differential/Platelet; Future  Labs/tests ordered:   Orders Placed This Encounter  Procedures  . CBC With differential/Platelet    Standing Status: Future     Number of Occurrences:      Standing Expiration Date: 06/28/2014  . Comprehensive metabolic panel    Standing Status: Future     Number of Occurrences:      Standing Expiration Date: 06/28/2014  . Lipid panel    Standing Status: Future     Number of Occurrences:      Standing Expiration Date: 06/28/2014  . Hemoglobin A1c    Standing Status: Future     Number of  Occurrences:      Standing Expiration Date: 06/28/2014    Next appt:  Annual exam, labs before

## 2013-06-27 NOTE — Progress Notes (Signed)
Patient seen in the office with Dr. Renato Gailseed today and labs discussed. SRice RMA

## 2013-10-16 ENCOUNTER — Ambulatory Visit (INDEPENDENT_AMBULATORY_CARE_PROVIDER_SITE_OTHER): Payer: BC Managed Care – PPO | Admitting: Emergency Medicine

## 2013-10-16 VITALS — BP 119/83 | HR 68 | Temp 98.0°F | Resp 18 | Wt 112.0 lb

## 2013-10-16 DIAGNOSIS — R42 Dizziness and giddiness: Secondary | ICD-10-CM

## 2013-10-16 DIAGNOSIS — R112 Nausea with vomiting, unspecified: Secondary | ICD-10-CM

## 2013-10-16 LAB — POCT URINALYSIS DIPSTICK
BILIRUBIN UA: NEGATIVE
Blood, UA: NEGATIVE
GLUCOSE UA: NEGATIVE
KETONES UA: NEGATIVE
Leukocytes, UA: NEGATIVE
Nitrite, UA: NEGATIVE
Protein, UA: NEGATIVE
Spec Grav, UA: 1.02
Urobilinogen, UA: 0.2
pH, UA: 7.5

## 2013-10-16 LAB — POCT CBC
GRANULOCYTE PERCENT: 67.9 % (ref 37–80)
HEMATOCRIT: 41 % (ref 37.7–47.9)
HEMOGLOBIN: 12.9 g/dL (ref 12.2–16.2)
LYMPH, POC: 2.3 (ref 0.6–3.4)
MCH, POC: 27.4 pg (ref 27–31.2)
MCHC: 31.4 g/dL — AB (ref 31.8–35.4)
MCV: 87.4 fL (ref 80–97)
MID (cbc): 0.2 (ref 0–0.9)
MPV: 9.1 fL (ref 0–99.8)
POC Granulocyte: 5.2 (ref 2–6.9)
POC LYMPH PERCENT: 29.9 %L (ref 10–50)
POC MID %: 2.2 %M (ref 0–12)
Platelet Count, POC: 200 10*3/uL (ref 142–424)
RBC: 4.69 M/uL (ref 4.04–5.48)
RDW, POC: 13.9 %
WBC: 7.6 10*3/uL (ref 4.6–10.2)

## 2013-10-16 LAB — GLUCOSE, POCT (MANUAL RESULT ENTRY): POC Glucose: 97 mg/dl (ref 70–99)

## 2013-10-16 MED ORDER — ONDANSETRON 4 MG PO TBDP
8.0000 mg | ORAL_TABLET | Freq: Once | ORAL | Status: AC
  Administered 2013-10-16: 8 mg via ORAL

## 2013-10-16 MED ORDER — ONDANSETRON 4 MG PO TBDP: 4.0000 mg | ORAL_TABLET | Freq: Three times a day (TID) | ORAL | Status: AC | PRN

## 2013-10-16 NOTE — Patient Instructions (Addendum)
If your symptoms do not resolve on Friday, please return to the clinic. Do not drive.  Do not walk up and down steps.  Do not climb up and stepladders.   You may take 1-2 tablets every 6-8 hours.

## 2013-10-16 NOTE — Progress Notes (Signed)
Subjective:    Patient ID: Heidi [name], female    DOB: 08/15/1950, 63 y.o.   MRN: [id number]  Dizziness Pertinent negatives include no abdominal pain, chest pain, fatigue, fever, headaches, nausea or vomiting.  Emesis  Associated symptoms include dizziness. Pertinent negatives include no abdominal pain, chest pain, diarrhea, fever or headaches.   This is a 63 year old female with a hx of anemia and DM2 is here today for a chief complaint of dizziness and emesis.  At 1 am, she went to the bathroom, completely asymptomatic.  She then went back to bed and layed down.  Within seconds, the room was spinning and there was an onset of nausea.  She grabbed her husband lying next to her and started vomiting.  The dizziness was intermittent throughout the rest of the night with the dizziness resolving about hourly.  She would sleep and then wake up with dizziness then nausea.  They arrived here around 9:30am, by 11am she thought the nausea and dizziness had resolved.  She denies fever, but was diaphoretic during her episodes emesis.  She denies tinnitus, ear fullness, headache, photophobia, chest palpitations, chest pain, or lightheadedness.  She also denies diarrhea or blood in stool.  She does not report any recent sickness.  She has done nothing to relieve symptoms.  No other members in the household is displaying any ill symptoms.  She does report that her mother has these same occurrences back [place], however cause is unknown.  She is deceased (unknown)     Review of Systems  Constitutional: Negative for fever and fatigue.  HENT: Negative for ear pain, rhinorrhea, sinus pressure and tinnitus.   Eyes: Negative for photophobia.  Cardiovascular: Negative for chest pain and palpitations.  Gastrointestinal: Negative for nausea, vomiting, abdominal pain and diarrhea.  Genitourinary: Negative for dysuria.  Neurological: Positive for dizziness. Negative for light-headedness and  headaches.       Objective:   Physical Exam  Constitutional: She is oriented to person, place, and time. She appears well-developed and well-nourished.  HENT:  Head: Normocephalic.  Right Ear: Tympanic membrane is not erythematous and not bulging.  Left Ear: Tympanic membrane is not erythematous and not bulging.  Nose: No mucosal edema.  Mouth/Throat: No oropharyngeal exudate or posterior oropharyngeal erythema.  Eyes: Conjunctivae are normal. Left eye nystagmus: Rotating head from right to left side, brought on the vertigo with horizontal shifting nystagmus, bilaterally. Right pupil is round and reactive. Left pupil is round and reactive. Pupils are equal.  Fundoscopic exam:      The right eye shows red reflex.       The left eye shows red reflex (Fundoscopic exam difficult to visualize at left eye, (post cataract surgery)).  Cardiovascular: Normal rate, regular rhythm and normal heart sounds.   Pulmonary/Chest: Effort normal and breath sounds normal. No respiratory distress. She has no wheezes.  Abdominal: Soft. Bowel sounds are normal. She exhibits no distension and no mass. There is no tenderness.  Musculoskeletal: She exhibits no edema.  Neurological: She is alert and oriented to person, place, and time. She has normal reflexes. She displays normal reflexes. No cranial nerve deficit. She displays a negative Romberg sign. Coordination and gait normal.  Skin: Skin is warm and dry.  Psychiatric: She has a normal mood and affect. Her behavior is normal. Judgment and thought content normal.   BP 119/83  Pulse 68  Temp(Src) 98 F (36.7 C) (Oral)  Resp 18  Wt 112  lb (50.803 kg)  SpO2 92%   Results for orders placed in visit on 10/16/13  GLUCOSE, POCT (MANUAL RESULT ENTRY)      Result Value Ref Range   POC Glucose 97  70 - 99 mg/dl  POCT CBC      Result Value Ref Range   WBC 7.6  4.6 - 10.2 K/uL   Lymph, poc 2.3  0.6 - 3.4   POC LYMPH PERCENT 29.9  10 - 50 %L   MID (cbc) 0.2   0 - 0.9   POC MID % 2.2  0 - 12 %M   POC Granulocyte 5.2  2 - 6.9   Granulocyte percent 67.9  37 - 80 %G   RBC 4.69  4.04 - 5.48 M/uL   Hemoglobin 12.9  12.2 - 16.2 g/dL   HCT, POC 16.141.0  09.637.7 - 47.9 %   MCV 87.4  80 - 97 fL   MCH, POC 27.4  27 - 31.2 pg   MCHC 31.4 (*) 31.8 - 35.4 g/dL   RDW, POC 04.513.9     Platelet Count, POC 200  142 - 424 K/uL   MPV 9.1  0 - 99.8 fL  POCT URINALYSIS DIPSTICK      Result Value Ref Range   Color, UA yellow     Clarity, UA clear     Glucose, UA neg     Bilirubin, UA neg     Ketones, UA neg     Spec Grav, UA 1.020     Blood, UA neg     pH, UA 7.5     Protein, UA neg     Urobilinogen, UA 0.2     Nitrite, UA neg     Leukocytes, UA Negative          Assessment & Plan:  63 year old female with a PMH of DM2 is here today for chief complaint of dizziness and emesis.   CBC, UA, and glucose are relatively normal, and do not suggest that she is enduring any dizziness and emesis from DM and/or hypovolemia.  During the physical, the dizziness and nystagmus returned with positional movement.  This makes me more suspicious of BPPV.  The history of her mother suffering from similar symptoms, and unknown death, warrants some caution to whether this could be of cerebral vascular etiology.  If the symptoms do not resolve in 2 days, she will return to the clinic for possible imaging.    Dizziness  Plan: POCT glucose (manual entry), POCT CBC, POCT urinalysis dipstick, COMPLETE METABOLIC PANEL WITH GFR, ondansetron (ZOFRAN-ODT) 4 MG disintegrating tablet Movement and driving restrictions given.  Asked to follow up if symptoms do not improve in 2 days.    Nausea and vomiting, vomiting of unspecified type  POCT glucose (manual entry), POCT urinalysis dipstick, ondansetron (ZOFRAN-ODT) disintegrating tablet 8 mg, ondansetron (ZOFRAN-ODT) 4 MG disintegrating tablet   Trena PlattStephanie Antwanette Wesche, PA-C [facility] 10/14/20152:01  PM

## 2013-10-17 ENCOUNTER — Telehealth: Payer: Self-pay

## 2013-10-17 ENCOUNTER — Other Ambulatory Visit: Payer: Self-pay | Admitting: Radiology

## 2013-10-17 ENCOUNTER — Telehealth: Payer: Self-pay | Admitting: Emergency Medicine

## 2013-10-17 ENCOUNTER — Ambulatory Visit
Admission: RE | Admit: 2013-10-17 | Discharge: 2013-10-17 | Disposition: A | Discharge status: Home / Self Care | Payer: BC Managed Care – PPO | Source: Ambulatory Visit | Attending: Emergency Medicine | Admitting: Emergency Medicine

## 2013-10-17 DIAGNOSIS — R112 Nausea with vomiting, unspecified: Secondary | ICD-10-CM

## 2013-10-17 DIAGNOSIS — R42 Dizziness and giddiness: Secondary | ICD-10-CM

## 2013-10-17 LAB — COMPLETE METABOLIC PANEL WITH GFR
ALT: 23 U/L (ref 0–35)
AST: 26 U/L (ref 0–37)
Albumin: 4.3 g/dL (ref 3.5–5.2)
Alkaline Phosphatase: 66 U/L (ref 39–117)
BUN: 12 mg/dL (ref 6–23)
CALCIUM: 9.6 mg/dL (ref 8.4–10.5)
CO2: 26 meq/L (ref 19–32)
Chloride: 103 mEq/L (ref 96–112)
Creat: 0.69 mg/dL (ref 0.50–1.10)
GFR, Est Non African American: >89 mL/min
Glucose, Bld: 95 mg/dL (ref 70–99)
Potassium: 4.8 mEq/L (ref 3.5–5.3)
SODIUM: 140 meq/L (ref 135–145)
TOTAL PROTEIN: 8 g/dL (ref 6.0–8.3)
Total Bilirubin: 0.4 mg/dL (ref 0.2–1.2)

## 2013-10-17 NOTE — Telephone Encounter (Signed)
I called and spoke to the patient last night and she had had one more episode of dizziness. When I called she was resting comfortably. I told her husband I would check on the patient in the morning and make further recommendations at that time.

## 2013-10-17 NOTE — Telephone Encounter (Signed)
Heidi [name] contacted patient, notifing her of time of CT no contrast scheduled for today.

## 2013-10-17 NOTE — Telephone Encounter (Signed)
Pt's husband is waiting to hear from Dr. Cleta Albertsaub for wife's referral for an MRI

## 2013-10-17 NOTE — Addendum Note (Signed)
Addended by: Johnnette LitterARDWELL, Marjo Grosvenor M on: 10/17/2013 01:51 PM   Modules accepted: Orders

## 2013-10-18 ENCOUNTER — Encounter: Payer: Self-pay | Admitting: Diagnostic Neuroimaging

## 2013-10-18 ENCOUNTER — Ambulatory Visit (INDEPENDENT_AMBULATORY_CARE_PROVIDER_SITE_OTHER): Payer: BC Managed Care – PPO | Admitting: Diagnostic Neuroimaging

## 2013-10-18 VITALS — Ht 62.0 in | Wt 112.8 lb

## 2013-10-18 DIAGNOSIS — H8111 Benign paroxysmal vertigo, right ear: Secondary | ICD-10-CM

## 2013-10-18 DIAGNOSIS — R42 Dizziness and giddiness: Secondary | ICD-10-CM

## 2013-10-18 NOTE — Progress Notes (Signed)
[facility]  PATIENT: Heidi [name] DOB: 12-01-1950  REFERRING CLINICIAN: Daub HISTORY FROM: patient  REASON FOR VISIT: new consult    HISTORICAL  CHIEF COMPLAINT:  Chief Complaint  Patient presents with  . Dizziness    vomiting    HISTORY OF PRESENT ILLNESS:   63 year old female with diabetes and hypercholesterolemia, here for evaluation of dizziness and vertigo. 10/15/13 in the evening patient had sudden onset of room spinning sensation with nausea, sweating, vomiting. No headache, vision changes, pain. No numbness in the hands or feet. No weakness. No recent infections, trauma, change in medication. Patient went to urgent care on 10/16/13, had blood work and CT scan ordered, which were unremarkable.  Patient symptoms improved yesterday and today are significantly better. Has been taking Zofran as needed for nausea.   REVIEW OF SYSTEMS: Full 14 system review of systems performed and notable only for dizziness ringing in ears spinning sensation.  ALLERGIES: No Known Allergies  HOME MEDICATIONS: Outpatient Prescriptions Prior to Visit  Medication Sig Dispense Refill  . alendronate (FOSAMAX) 70 MG tablet Take 70 mg by mouth once a week. Take with a full glass of water on an empty stomach. Rx'ed by GYN      . aspirin EC 81 MG tablet Take 81 mg by mouth daily.      . calcium-vitamin D (OSCAL WITH D) 500-200 MG-UNIT per tablet Take 1 tablet by mouth daily.      . cetirizine (ZYRTEC) 5 MG tablet Take 5 mg by mouth daily as needed for allergies.       . Multiple Vitamin (MULTIVITAMIN) tablet Take 1 tablet by mouth daily.      . ondansetron (ZOFRAN-ODT) 4 MG disintegrating tablet Take 1-2 tablets (4-8 mg total) by mouth every 8 (eight) hours as needed for nausea or vomiting.  20 tablet  0   No facility-administered medications prior to visit.    PAST MEDICAL HISTORY: Past Medical History  Diagnosis Date  . Allergy   . Lymphocytosis (symptomatic)   .  Disorder of bone and cartilage, unspecified   . Eosinophilia   . Type II or unspecified type diabetes mellitus without mention of complication, uncontrolled   . Unspecified disorder of liver   . Pain in joint, shoulder region   . Reflux esophagitis   . Anemia, unspecified   . Other and unspecified hyperlipidemia   . Allergic rhinitis due to pollen   . Other abnormal blood chemistry   . Umbilical hernia without mention of obstruction or gangrene     PAST SURGICAL HISTORY: Past Surgical History  Procedure Laterality Date  . Cesarean section      FAMILY HISTORY: History reviewed. No pertinent family history.  SOCIAL HISTORY:  History   Social History  . Marital Status: Married    Spouse Name: Bampia    Number of Children: 4  . Years of Education: BSN   Occupational History  . RN    Social History Main Topics  . Smoking status: Never Smoker   . Smokeless tobacco: Never Used  . Alcohol Use: No  . Drug Use: No  . Sexual Activity: Yes    Partners: Male   Other Topics Concern  . Not on file   Social History Narrative   Married   Never smoked   Alcohol none   Exercise walk 2-3 times week              PHYSICAL EXAM  Filed Vitals:   10/18/13 1138  Height: 5\' 2"  (1.575 m)  Weight: 112 lb 12.8 oz (51.166 kg)    Not recorded    Visual Acuity Screening   Right eye Left eye Both eyes  Without correction: 20/200 20/70   With correction:        Body mass index is 20.63 kg/(m^2).  GENERAL EXAM: Patient is in no distress; SLIGHTLY WEAK APPEARING; well developed, nourished and groomed; neck is supple; DIX HALLPIKE REPRODUCES MILD SYMPTOMS (WORSE WITH HEAD TURNED TO RIGHT) WITHOUT NYSTAGMUS.   CARDIOVASCULAR: Regular rate and rhythm, no murmurs, no carotid bruits  NEUROLOGIC: MENTAL STATUS: awake, alert, oriented to person, place and time, recent and remote memory intact, normal attention and concentration, language fluent, comprehension intact, naming  intact, fund of knowledge appropriate CRANIAL NERVE: no papilledema on fundoscopic exam, pupils equal and reactive to light, visual fields full to confrontation, extraocular muscles intact, no nystagmus, facial sensation and strength symmetric, hearing intact, palate elevates symmetrically, uvula midline, shoulder shrug symmetric, tongue midline. MOTOR: normal bulk and tone, full strength in the BUE, BLE SENSORY: normal and symmetric to light touch, temperature, vibration COORDINATION: finger-nose-finger, fine finger movements normal REFLEXES: deep tendon reflexes present and symmetric GAIT/STATION: narrow based gait; DIFF WITH TANDEM; romberg is negative    DIAGNOSTIC DATA (LABS, IMAGING, TESTING) - I reviewed patient records, labs, notes, testing and imaging myself where available.  Lab Results  Component Value Date   WBC 7.6 10/16/2013   HGB 12.9 10/16/2013   HCT 41.0 10/16/2013   MCV 87.4 10/16/2013   PLT 212 11/19/2012      Component Value Date/Time   NA 140 10/16/2013 1247   NA 140 06/25/2013 0846   NA 138 11/19/2012 1251   K 4.8 10/16/2013 1247   K 3.8 11/19/2012 1251   CL 103 10/16/2013 1247   CL 108* 04/23/2012 1302   CO2 26 10/16/2013 1247   CO2 27 11/19/2012 1251   GLUCOSE 95 10/16/2013 1247   GLUCOSE 81 06/25/2013 0846   GLUCOSE 80 11/19/2012 1251   GLUCOSE 88 04/23/2012 1302   BUN 12 10/16/2013 1247   BUN 14 06/25/2013 0846   BUN 13.7 11/19/2012 1251   CREATININE 0.69 10/16/2013 1247   CREATININE 0.98 06/25/2013 0846   CREATININE 0.9 11/19/2012 1251   CALCIUM 9.6 10/16/2013 1247   CALCIUM 9.2 11/19/2012 1251   PROT 8.0 10/16/2013 1247   PROT 7.4 06/25/2013 0846   PROT 7.7 11/19/2012 1251   ALBUMIN 4.3 10/16/2013 1247   ALBUMIN 3.8 11/19/2012 1251   AST 26 10/16/2013 1247   AST 23 11/19/2012 1251   ALT 23 10/16/2013 1247   ALT 17 11/19/2012 1251   ALKPHOS 66 10/16/2013 1247   ALKPHOS 73 11/19/2012 1251   BILITOT 0.4 10/16/2013 1247   BILITOT 0.33  11/19/2012 1251   GFRNONAA >89 10/16/2013 1247   GFRNONAA 62 06/25/2013 0846   GFRAA >89 10/16/2013 1247   GFRAA 71 06/25/2013 0846   Lab Results  Component Value Date   HDL 65 06/25/2013   LDLCALC 145* 06/25/2013   TRIG 51 06/25/2013   CHOLHDL 3.4 06/25/2013   Lab Results  Component Value Date   HGBA1C 6.0* 06/25/2013   No results found for this basename: VITAMINB12   No results found for this basename: TSH    I reviewed images myself and agree with interpretation. -VRP  10/17/13 CT HEAD 1. Ventricular dilatation is again noted, slightly disproportionate to the degree of diffuse cortical atrophy. Is there any clinical suspicion of normal  pressure hydrocephalus?  2. Atrophy and moderate small vessel ischemic change.   ASSESSMENT AND PLAN  63 y.o. year old female here with sudden positional vertigo on 10/15/13, now significantly improving. Likely benign positional vertigo. Offered to check MRI brain, but patient would like to hold off because she is getting better quickly.  PLAN: - monitor symptoms; may consider MRI brain if symptoms worsen - consider meclizine prn; continue zofran prn  Return in about 6 weeks (around 11/29/2013).    Suanne MarkerVIKRAM R. PENUMALLI, MD 10/18/2013, 12:29 PM Certified in Neurology, Neurophysiology and Neuroimaging  [facility] [address], [address] [place], [place] [address] [phone]

## 2013-10-21 ENCOUNTER — Telehealth: Payer: Self-pay | Admitting: Diagnostic Neuroimaging

## 2013-10-21 NOTE — Telephone Encounter (Signed)
Meclizine is over the counter. May try 12.5 or 25mg  TID as tolerated. -VRP

## 2013-10-21 NOTE — Telephone Encounter (Signed)
Patient calling to state that Dr. Marjory LiesPenumalli said he was going to [facility] [address] for dizziness but they say they haven't received it yet, please return call and advise.

## 2013-10-21 NOTE — Telephone Encounter (Signed)
Patient is requesting Rx for vertigo.  Last OV note says: - consider meclizine prn I will be happy to send Rx if permissible.  Available in 12.5mg  or 25mg  dose.  Please advise.  Thank you.

## 2013-10-21 NOTE — Telephone Encounter (Signed)
I called the patient back, got no answer.  Left message.  

## 2013-11-18 ENCOUNTER — Ambulatory Visit (INDEPENDENT_AMBULATORY_CARE_PROVIDER_SITE_OTHER): Payer: BC Managed Care – PPO | Admitting: Family Medicine

## 2013-11-18 ENCOUNTER — Encounter: Payer: Self-pay | Admitting: Family Medicine

## 2013-11-18 VITALS — BP 109/71 | HR 71 | Temp 98.3°F | Resp 16 | Ht 60.5 in | Wt 116.8 lb

## 2013-11-18 DIAGNOSIS — R195 Other fecal abnormalities: Secondary | ICD-10-CM

## 2013-11-18 DIAGNOSIS — R1013 Epigastric pain: Secondary | ICD-10-CM

## 2013-11-18 LAB — POCT CBC
Granulocyte percent: 33.9 %G — AB (ref 37–80)
HCT, POC: 36.9 % — AB (ref 37.7–47.9)
Hemoglobin: 11.9 g/dL — AB (ref 12.2–16.2)
LYMPH, POC: 4.3 — AB (ref 0.6–3.4)
MCH, POC: 27.8 pg (ref 27–31.2)
MCHC: 32.2 g/dL (ref 31.8–35.4)
MCV: 86.4 fL (ref 80–97)
MID (cbc): 0.4 (ref 0–0.9)
MPV: 8.7 fL (ref 0–99.8)
POC GRANULOCYTE: 2.4 (ref 2–6.9)
POC LYMPH %: 60.9 % — AB (ref 10–50)
POC MID %: 5.2 % (ref 0–12)
Platelet Count, POC: 192 10*3/uL (ref 142–424)
RBC: 4.27 M/uL (ref 4.04–5.48)
RDW, POC: 12.7 %
WBC: 7.1 10*3/uL (ref 4.6–10.2)

## 2013-11-18 NOTE — Progress Notes (Signed)
Subjective:    Patient ID: Heidi [name], female    DOB: 12/28/1950, 63 y.o.   MRN: [id number]  HPI This is a pleasant 63 yo female who is accompanied by her husband. The patient presents today with upper abdominal pain for 3-4 days that occurred last week. She is not currently having pain. The pain was constant and achy in nature. The pain was not related to ingestion of food. She has been on Fosamax for about 7 months. She held her dose last week due to stomach upset. She is scheduled to take it tomorrow. She had taken ibuprofen 200 mg once for shoulder pain prior to her abdominal pain starting. She took some tums, but stopped when she read that it could cause an interactaction with her fosamax. She had two dark colored stools last week. She had a normal colonoscopy 02/2013. Her appetite is unchanged. Her husband bought her some OTC Prilosec, but she has not taken any.   She saw Dr. Cleta Albertsaub 10/16/2013 for dizziness. She was referred to and saw Dr. Marjory LiesPenumalli and was diagnosed with benign positional vertigo. Her dizziness has since resolved.   The patient is [facility] a patient [facility], but would like to establish care with a physician at [facility].   She has a history of DM type 2, controlled with diet/exercise. Last HgA1C 6.0.   Review of Systems No fever, no vomiting, no nausea. No dizziness. No headache. No chest pain. No SOB. No dysuria, no hematuria, no urinary frequency. No diarrhea. No constipation.     Objective:   Physical Exam  Constitutional: She is oriented to person, place, and time. She appears well-developed and well-nourished.  HENT:  Head: Normocephalic and atraumatic.  Eyes: Conjunctivae are normal.  Neck: Normal range of motion. Neck supple.  Cardiovascular: Normal rate, regular rhythm and normal heart sounds.   Pulmonary/Chest: Effort normal and breath sounds normal. No respiratory distress. She has no wheezes. She has no rales. She exhibits no tenderness.    Abdominal: Soft. Bowel sounds are normal. She exhibits no distension and no mass. There is no tenderness. There is no rebound and no guarding.  Musculoskeletal: Normal range of motion.  Neurological: She is alert and oriented to person, place, and time.  Skin: Skin is warm and dry.  Psychiatric: She has a normal mood and affect. Her behavior is normal. Judgment and thought content normal.  Vitals reviewed. BP 109/71 mmHg  Pulse 71  Temp(Src) 98.3 F (36.8 C) (Oral)  Resp 16  Ht 5' 0.5" (1.537 m)  Wt 116 lb 12.8 oz (52.98 kg)  BMI 22.43 kg/m2  SpO2 99%. Results for orders placed or performed in visit on 11/18/13  POCT CBC  Result Value Ref Range   WBC 7.1 4.6 - 10.2 K/uL   Lymph, poc 4.3 (A) 0.6 - 3.4   POC LYMPH PERCENT 60.9 (A) 10 - 50 %L   MID (cbc) 0.4 0 - 0.9   POC MID % 5.2 0 - 12 %M   POC Granulocyte 2.4 2 - 6.9   Granulocyte percent 33.9 (A) 37 - 80 %G   RBC 4.27 4.04 - 5.48 M/uL   Hemoglobin 11.9 (A) 12.2 - 16.2 g/dL   HCT, POC 52.836.9 (A) 41.337.7 - 47.9 %   MCV 86.4 80 - 97 fL   MCH, POC 27.8 27 - 31.2 pg   MCHC 32.2 31.8 - 35.4 g/dL   RDW, POC 24.412.7 %   Platelet Count, POC 192 142 -  424 K/uL   MPV 8.7 0 - 99.8 fL   CBC Latest Ref Rng 11/18/2013 10/16/2013 06/25/2013  WBC 4.6 - 10.2 K/uL 7.1 7.6 5.3  Hemoglobin 12.2 - 16.2 g/dL 11.9(A) 12.9 12.8  Hematocrit 37.7 - 47.9 % 36.9(A) 41.0 37.6  Platelets 145 - 400 10e3/uL - - -      Assessment & Plan:  1. Abdominal pain, epigastric - POCT CBC- her Hgb/Hct slightly decreased from 1 month ago -Not sure if this is related to Fosamax- will hold for next 2 weeks -Prilosec OTC daily x 14 days -Hemoccult x 3 - Will notify her of hemoccult results, she is to RTC if her pain returns or if she has any further dark stools.   The patient has [facility]. She is scheduled with Dr. Patsy Lageropland for 01/20/14.  Emi Belfasteborah B. Gessner, FNP-BC  [facility], [facility]  11/19/2013  7:57 PM

## 2013-11-18 NOTE — Patient Instructions (Signed)
Please take prilosec daily for 2 weeks Hold Fosamax tomorrow and next week Return if your pain returns or if you have more dark stools  Gastritis, Adult Gastritis is soreness and swelling (inflammation) of the lining of the stomach. Gastritis can develop as a sudden onset (acute) or long-term (chronic) condition. If gastritis is not treated, it can lead to stomach bleeding and ulcers. CAUSES  Gastritis occurs when the stomach lining is weak or damaged. Digestive juices from the stomach then inflame the weakened stomach lining. The stomach lining may be weak or damaged due to viral or bacterial infections. One common bacterial infection is the Helicobacter pylori infection. Gastritis can also result from excessive alcohol consumption, taking certain medicines, or having too much acid in the stomach.  SYMPTOMS  In some cases, there are no symptoms. When symptoms are present, they may include:  Pain or a burning sensation in the upper abdomen.  Nausea.  Vomiting.  An uncomfortable feeling of fullness after eating. DIAGNOSIS  Your caregiver may suspect you have gastritis based on your symptoms and a physical exam. To determine the cause of your gastritis, your caregiver may perform the following:  Blood or stool tests to check for the H pylori bacterium.  Gastroscopy. A thin, flexible tube (endoscope) is passed down the esophagus and into the stomach. The endoscope has a light and camera on the end. Your caregiver uses the endoscope to view the inside of the stomach.  Taking a tissue sample (biopsy) from the stomach to examine under a microscope. TREATMENT  Depending on the cause of your gastritis, medicines may be prescribed. If you have a bacterial infection, such as an H pylori infection, antibiotics may be given. If your gastritis is caused by too much acid in the stomach, H2 blockers or antacids may be given. Your caregiver may recommend that you stop taking aspirin, ibuprofen, or other  nonsteroidal anti-inflammatory drugs (NSAIDs). HOME CARE INSTRUCTIONS  Only take over-the-counter or prescription medicines as directed by your caregiver.  If you were given antibiotic medicines, take them as directed. Finish them even if you start to feel better.  Drink enough fluids to keep your urine clear or pale yellow.  Avoid foods and drinks that make your symptoms worse, such as:  Caffeine or alcoholic drinks.  Chocolate.  Peppermint or mint flavorings.  Garlic and onions.  Spicy foods.  Citrus fruits, such as oranges, lemons, or limes.  Tomato-based foods such as sauce, chili, salsa, and pizza.  Fried and fatty foods.  Eat small, frequent meals instead of large meals. SEEK IMMEDIATE MEDICAL CARE IF:   You have black or dark red stools.  You vomit blood or material that looks like coffee grounds.  You are unable to keep fluids down.  Your abdominal pain gets worse.  You have a fever.  You do not feel better after 1 week.  You have any other questions or concerns. MAKE SURE YOU:  Understand these instructions.  Will watch your condition.  Will get help right away if you are not doing well or get worse. Document Released: 12/14/2000 Document Revised: 06/21/2011 Document Reviewed: 02/02/2011 [facility] Patient Information 2015 [employer] [place]. This information is not intended to replace advice given to you by your health care provider. Make sure you discuss any questions you have with your health care provider.

## 2013-12-02 ENCOUNTER — Encounter: Payer: Self-pay | Admitting: Diagnostic Neuroimaging

## 2013-12-02 ENCOUNTER — Ambulatory Visit (INDEPENDENT_AMBULATORY_CARE_PROVIDER_SITE_OTHER): Payer: BC Managed Care – PPO | Admitting: Diagnostic Neuroimaging

## 2013-12-02 VITALS — BP 112/73 | HR 74 | Temp 98.5°F | Ht 60.0 in | Wt 117.0 lb

## 2013-12-02 DIAGNOSIS — H8111 Benign paroxysmal vertigo, right ear: Secondary | ICD-10-CM

## 2013-12-02 NOTE — Progress Notes (Signed)
[facility]  PATIENT: Heidi [name] DOB: [birth year]  REFERRING CLINICIAN: Daub HISTORY FROM: patient  REASON FOR VISIT: new consult    HISTORICAL  CHIEF COMPLAINT:  Chief Complaint  Patient presents with  . Follow-up    HISTORY OF PRESENT ILLNESS:   UPDATE 12/02/13: Since last visit, continues to do well. No more dizziness. Only some intermittent ringing in ears, mild. Overall, feels well.  PRIOR HPI (10/18/13): 63 year old female with diabetes and hypercholesterolemia, here for evaluation of dizziness and vertigo. 10/15/13 in the evening patient had sudden onset of room spinning sensation with nausea, sweating, vomiting. No headache, vision changes, pain. No numbness in the hands or feet. No weakness. No recent infections, trauma, change in medication. Patient went to urgent care on 10/16/13, had blood work and CT scan ordered, which were unremarkable. Patient symptoms improved yesterday and today are significantly better. Has been taking Zofran as needed for nausea.   REVIEW OF SYSTEMS: Full 14 system review of systems performed and notable only for dizziness ringing in ears spinning sensation.  ALLERGIES: No Known Allergies  HOME MEDICATIONS: Outpatient Prescriptions Prior to Visit  Medication Sig Dispense Refill  . aspirin EC 81 MG tablet Take 81 mg by mouth daily.    . calcium-vitamin D (OSCAL WITH D) 500-200 MG-UNIT per tablet Take 1 tablet by mouth daily.    . cetirizine (ZYRTEC) 5 MG tablet Take 5 mg by mouth daily as needed for allergies.     . Multiple Vitamin (MULTIVITAMIN) tablet Take 1 tablet by mouth daily.    Marland Kitchen alendronate (FOSAMAX) 70 MG tablet Take 70 mg by mouth once a week. Take with a full glass of water on an empty stomach. Rx'ed by GYN     No facility-administered medications prior to visit.    PAST MEDICAL HISTORY: Past Medical History  Diagnosis Date  . Allergy   . Lymphocytosis (symptomatic)   . Disorder of bone and  cartilage, unspecified   . Eosinophilia   . Type II or unspecified type diabetes mellitus without mention of complication, uncontrolled   . Unspecified disorder of liver   . Pain in joint, shoulder region   . Reflux esophagitis   . Anemia, unspecified   . Other and unspecified hyperlipidemia   . Allergic rhinitis due to pollen   . Other abnormal blood chemistry   . Umbilical hernia without mention of obstruction or gangrene     PAST SURGICAL HISTORY: Past Surgical History  Procedure Laterality Date  . Cesarean section      FAMILY HISTORY: History reviewed. No pertinent family history.  SOCIAL HISTORY:  History   Social History  . Marital Status: Married    Spouse Name: Bampia    Number of Children: 4  . Years of Education: BSN   Occupational History  . RN    Social History Main Topics  . Smoking status: Never Smoker   . Smokeless tobacco: Never Used  . Alcohol Use: No  . Drug Use: No  . Sexual Activity:    Partners: Male   Other Topics Concern  . Not on file   Social History Narrative   Married   Never smoked   Alcohol none   Exercise walk 2-3 times week              PHYSICAL EXAM  Filed Vitals:   12/02/13 1314  BP: 112/73  Pulse: 74  Temp: 98.5 F (36.9 C)  TempSrc: Oral  Height: 5' (1.524 m)  Weight:  117 lb (53.071 kg)    Not recorded     No exam data present   Body mass index is 22.85 kg/(m^2).  GENERAL EXAM: Patient is in no distress; appears well  CARDIOVASCULAR: Regular rate and rhythm, no murmurs, no carotid bruits  NEUROLOGIC: MENTAL STATUS: awake, alert, language fluent, comprehension intact, naming intact, fund of knowledge appropriate CRANIAL NERVE: no papilledema on fundoscopic exam, pupils equal and reactive to light, visual fields full to confrontation, extraocular muscles intact, no nystagmus, facial sensation and strength symmetric, hearing intact, palate elevates symmetrically, uvula midline, shoulder shrug  symmetric, tongue midline. MOTOR: normal bulk and tone, full strength in the BUE, BLE SENSORY: normal and symmetric to light touch, temperature, vibration COORDINATION: finger-nose-finger, fine finger movements normal REFLEXES: deep tendon reflexes present and symmetric GAIT/STATION: narrow based gait; ABLE TO TANDEM; romberg is negative    DIAGNOSTIC DATA (LABS, IMAGING, TESTING) - I reviewed patient records, labs, notes, testing and imaging myself where available.  Lab Results  Component Value Date   WBC 7.1 11/18/2013   HGB 11.9* 11/18/2013   HCT 36.9* 11/18/2013   MCV 86.4 11/18/2013   PLT 212 11/19/2012      Component Value Date/Time   NA 140 10/16/2013 1247   NA 140 06/25/2013 0846   NA 138 11/19/2012 1251   K 4.8 10/16/2013 1247   K 3.8 11/19/2012 1251   CL 103 10/16/2013 1247   CL 108* 04/23/2012 1302   CO2 26 10/16/2013 1247   CO2 27 11/19/2012 1251   GLUCOSE 95 10/16/2013 1247   GLUCOSE 81 06/25/2013 0846   GLUCOSE 80 11/19/2012 1251   GLUCOSE 88 04/23/2012 1302   BUN 12 10/16/2013 1247   BUN 14 06/25/2013 0846   BUN 13.7 11/19/2012 1251   CREATININE 0.69 10/16/2013 1247   CREATININE 0.98 06/25/2013 0846   CREATININE 0.9 11/19/2012 1251   CALCIUM 9.6 10/16/2013 1247   CALCIUM 9.2 11/19/2012 1251   PROT 8.0 10/16/2013 1247   PROT 7.4 06/25/2013 0846   PROT 7.7 11/19/2012 1251   ALBUMIN 4.3 10/16/2013 1247   ALBUMIN 3.8 11/19/2012 1251   AST 26 10/16/2013 1247   AST 23 11/19/2012 1251   ALT 23 10/16/2013 1247   ALT 17 11/19/2012 1251   ALKPHOS 66 10/16/2013 1247   ALKPHOS 73 11/19/2012 1251   BILITOT 0.4 10/16/2013 1247   BILITOT 0.33 11/19/2012 1251   GFRNONAA >89 10/16/2013 1247   GFRNONAA 62 06/25/2013 0846   GFRAA >89 10/16/2013 1247   GFRAA 71 06/25/2013 0846   Lab Results  Component Value Date   HDL 65 06/25/2013   LDLCALC 145* 06/25/2013   TRIG 51 06/25/2013   CHOLHDL 3.4 06/25/2013   Lab Results  Component Value Date   HGBA1C 6.0*  06/25/2013   No results found for: VITAMINB12 No results found for: TSH  I reviewed images myself and agree with interpretation. -VRP  10/17/13 CT HEAD 1. Ventricular dilatation is again noted, slightly disproportionate to the degree of diffuse cortical atrophy. Is there any clinical suspicion of normal pressure hydrocephalus?  2. Atrophy and moderate small vessel ischemic change.   ASSESSMENT AND PLAN  63 y.o. year old female here with sudden positional vertigo on 10/15/13, now significantly improving. Likely benign positional vertigo. Offered to check MRI brain, but patient would like to hold off because she is getting better.   PLAN: - monitor symptoms; may consider MRI brain if symptoms worsen in future  Return for return to PCP.  Suanne MarkerVIKRAM R. Sherlie Boyum, MD 12/02/2013, 1:54 PM Certified in Neurology, Neurophysiology and Neuroimaging  [facility] [address], [address] [place], [place] [address] [phone]

## 2013-12-02 NOTE — Patient Instructions (Signed)
Follow up as needed

## 2013-12-26 ENCOUNTER — Other Ambulatory Visit: Payer: BC Managed Care – PPO

## 2013-12-30 ENCOUNTER — Encounter: Payer: BC Managed Care – PPO | Admitting: Internal Medicine

## 2014-01-20 ENCOUNTER — Encounter: Payer: Self-pay | Admitting: Family Medicine

## 2014-01-20 ENCOUNTER — Ambulatory Visit (INDEPENDENT_AMBULATORY_CARE_PROVIDER_SITE_OTHER): Payer: BC Managed Care – PPO | Admitting: Family Medicine

## 2014-01-20 VITALS — BP 112/80 | HR 62 | Temp 98.5°F | Resp 16 | Ht 62.0 in | Wt 114.2 lb

## 2014-01-20 DIAGNOSIS — E119 Type 2 diabetes mellitus without complications: Secondary | ICD-10-CM

## 2014-01-20 DIAGNOSIS — Z0000 Encounter for general adult medical examination without abnormal findings: Secondary | ICD-10-CM

## 2014-01-20 LAB — CBC
HEMATOCRIT: 39 % (ref 36.0–46.0)
Hemoglobin: 13.6 g/dL (ref 12.0–15.0)
MCH: 28.5 pg (ref 26.0–34.0)
MCHC: 34.9 g/dL (ref 30.0–36.0)
MCV: 81.6 fL (ref 78.0–100.0)
MPV: 11.2 fL (ref 8.6–12.4)
PLATELETS: 215 10*3/uL (ref 150–400)
RBC: 4.78 MIL/uL (ref 3.87–5.11)
RDW: 13.1 % (ref 11.5–15.5)
WBC: 4.7 10*3/uL (ref 4.0–10.5)

## 2014-01-20 LAB — LIPID PANEL
CHOLESTEROL: 229 mg/dL — AB (ref 0–200)
HDL: 66 mg/dL (ref >39)
LDL Cholesterol: 153 mg/dL — ABNORMAL HIGH (ref 0–99)
Total CHOL/HDL Ratio: 3.5 Ratio
Triglycerides: 52 mg/dL (ref <150)
VLDL: 10 mg/dL (ref 0–40)

## 2014-01-20 LAB — MICROALBUMIN, URINE: MICROALB UR: 0.3 mg/dL (ref <2.0)

## 2014-01-20 LAB — HEMOGLOBIN A1C
Hgb A1c MFr Bld: 5.6 % (ref <5.7)
MEAN PLASMA GLUCOSE: 114 mg/dL (ref <117)

## 2014-01-20 NOTE — Progress Notes (Signed)
[facility] [address], [place] [id number] [phone]  Date:  01/20/2014   Name:  Heidi [name]   DOB:  01/14/1950   MRN:  [id number]  PCP:  Bufford SpikesEED, TIFFANY, DO    Chief Complaint: Annual Exam   History of Present Illness:  Heidi Cruzminata Sandefur is a 64 y.o. very pleasant female patient who presents with the following:  Here today for a CPE, but does not desire a pap.  History of well- controlled DM with diet only.  She sees an OB-GYN for her pap and mammogram.   She is fasting today for labs.   She has been off her fosamoax for a few months.  Her last bone density scan 1-2 years ago.  This is handled by her OBG.   She is an Charity fundraiserN at [employer].  She does not have any other concerns today, is generally doing very well.   Lab Results  Component Value Date   HGBA1C 6.0* 06/25/2013     Patient Active Problem List   Diagnosis Date Noted  . Special screening for malignant neoplasms, colon 12/10/2012  . Lymphocytosis (symptomatic)   . Disorder of bone and cartilage, unspecified   . Type II or unspecified type diabetes mellitus without mention of complication, uncontrolled   . Reflux esophagitis   . Anemia, unspecified   . Other and unspecified hyperlipidemia   . Lymphocytosis 01/16/2012  . Idiopathic eosinophilia 04/05/2011  . NONSPECIFIC ABNORMAL RESULTS LIVR FUNCTION STUDY 08/15/2008  . ANEMIA, HX OF 08/15/2008    Past Medical History  Diagnosis Date  . Allergy   . Lymphocytosis (symptomatic)   . Disorder of bone and cartilage, unspecified   . Eosinophilia   . Type II or unspecified type diabetes mellitus without mention of complication, uncontrolled   . Unspecified disorder of liver   . Pain in joint, shoulder region   . Reflux esophagitis   . Anemia, unspecified   . Other and unspecified hyperlipidemia   . Allergic rhinitis due to pollen   . Other abnormal blood chemistry   . Umbilical hernia without mention of obstruction or gangrene      Past Surgical History  Procedure Laterality Date  . Cesarean section      History  Substance Use Topics  . Smoking status: Never Smoker   . Smokeless tobacco: Never Used  . Alcohol Use: No    No family history on file.  No Known Allergies  Medication list has been reviewed and updated.  Current Outpatient Prescriptions on File Prior to Visit  Medication Sig Dispense Refill  . aspirin EC 81 MG tablet Take 81 mg by mouth daily.    . calcium-vitamin D (OSCAL WITH D) 500-200 MG-UNIT per tablet Take 1 tablet by mouth daily.    . cetirizine (ZYRTEC) 5 MG tablet Take 5 mg by mouth daily as needed for allergies.     . Multiple Vitamin (MULTIVITAMIN) tablet Take 1 tablet by mouth daily.    Marland Kitchen. alendronate (FOSAMAX) 70 MG tablet Take 70 mg by mouth once a week. Take with a full glass of water on an empty stomach. Rx'ed by GYN     No current facility-administered medications on file prior to visit.    Review of Systems:  As per HPI- otherwise negative.   Physical Examination: Filed Vitals:   01/20/14 1000  BP: 112/80  Pulse: 62  Temp: 98.5 F (36.9 C)  Resp: 16   Filed Vitals:   01/20/14 1000  Height:  (1.575 m)  Weight: 114 lb 3.2 oz (51.801 kg)   Body mass index is 20.88 kg/(m^2). Ideal Body Weight: Weight in (lb) to have BMI = 25: 136.4  GEN: WDWN, NAD, Non-toxic, A & O x 3,appears well HEENT: Atraumatic, Normocephalic. Neck supple. No masses, No LAD.  Bilateral TM wnl, oropharynx normal.  PEERL,EOMI.   Ears and Nose: No external deformity. CV: RRR, No M/G/R. No JVD. No thrill. No extra heart sounds. PULM: CTA B, no wheezes, crackles, rhonchi. No retractions. No resp. distress. No accessory muscle use. ABD: S, NT, ND. No rebound. No HSM. EXTR: No c/c/e NEURO Normal gait.  PSYCH: Normally interactive. Conversant. Not depressed or anxious appearing.  Calm demeanor.  Normal bilateral foot exam  Assessment and Plan: Diabetes mellitus type 2, controlled -  Plan: CBC, Lipid panel, Microalbumin, urine, Hemoglobin A1c  Physical exam  Await her labs- DM is generally under good control with diet only. Immunizations are UTD.  She will see her OBG in a month for her well- woman care Will plan further follow- up pending labs. Plan recheck in 6 months unless labs necessitate otherwise   Signed Abbe Amsterdam, MD

## 2014-01-20 NOTE — Patient Instructions (Addendum)
I will be in touch with your labs It looks like you are doing great!  Keep up the good work  As a diabetic, there are several things you can do to monitor your condition and maintain your health.  1. Check your feet daily for any skin breakdown 2. Exercise and keep track of your diet 3. Let us know before you run out of your medications 4. Get your annual flu shot, and ask if you need a pneumonia shot 5. Ask if you are up to date on your labs; you should have an A1c every 6 months, a urine protein test annually, and a cholesterol test annually.  Your doctor may decide to do labs more often if indicated 6. Take off your shoes and socks at each visit.  Be sure your doctor examines your feet.   7. Ask about your blood pressure.  Your goal is 130/ 80 or less 8. Get an annual eye exam.  Please ask your ophthalmologist to send us your report 9. Keep up with your dental cleanings and exams.

## 2014-04-23 ENCOUNTER — Other Ambulatory Visit: Payer: Self-pay | Admitting: Obstetrics and Gynecology

## 2014-04-28 ENCOUNTER — Ambulatory Visit (HOSPITAL_COMMUNITY)
Admission: RE | Admit: 2014-04-28 | Discharge: 2014-04-28 | Disposition: A | Discharge status: Home / Self Care | Payer: BC Managed Care – PPO | Source: Ambulatory Visit | Attending: Obstetrics and Gynecology | Admitting: Obstetrics and Gynecology

## 2014-04-28 ENCOUNTER — Encounter (HOSPITAL_COMMUNITY): Payer: Self-pay

## 2014-04-28 DIAGNOSIS — M810 Age-related osteoporosis without current pathological fracture: Secondary | ICD-10-CM | POA: Insufficient documentation

## 2014-04-28 MED ORDER — ZOLEDRONIC ACID 5 MG/100ML IV SOLN
5.0000 mg | Freq: Once | INTRAVENOUS | Status: AC
  Administered 2014-04-28: 5 mg via INTRAVENOUS
  Filled 2014-04-28: qty 100

## 2014-04-28 MED ORDER — SODIUM CHLORIDE 0.9 % IV SOLN
Freq: Once | INTRAVENOUS | Status: AC
  Administered 2014-04-28: 13:00:00 via INTRAVENOUS

## 2014-04-28 NOTE — Discharge Instructions (Signed)

## 2014-09-10 ENCOUNTER — Encounter: Payer: Self-pay | Admitting: Family Medicine

## 2014-09-10 ENCOUNTER — Ambulatory Visit (INDEPENDENT_AMBULATORY_CARE_PROVIDER_SITE_OTHER): Payer: BC Managed Care – PPO | Admitting: Family Medicine

## 2014-09-10 VITALS — BP 120/70 | HR 60 | Temp 98.0°F | Resp 16 | Ht 61.0 in | Wt 116.8 lb

## 2014-09-10 DIAGNOSIS — Z130 Encounter for screening for diseases of the blood and blood-forming organs and certain disorders involving the immune mechanism: Secondary | ICD-10-CM | POA: Diagnosis not present

## 2014-09-10 DIAGNOSIS — Z7189 Other specified counseling: Secondary | ICD-10-CM

## 2014-09-10 DIAGNOSIS — Z119 Encounter for screening for infectious and parasitic diseases, unspecified: Secondary | ICD-10-CM

## 2014-09-10 DIAGNOSIS — E785 Hyperlipidemia, unspecified: Secondary | ICD-10-CM | POA: Diagnosis not present

## 2014-09-10 DIAGNOSIS — E119 Type 2 diabetes mellitus without complications: Secondary | ICD-10-CM

## 2014-09-10 DIAGNOSIS — Z23 Encounter for immunization: Secondary | ICD-10-CM

## 2014-09-10 DIAGNOSIS — Z7184 Encounter for health counseling related to travel: Secondary | ICD-10-CM

## 2014-09-10 LAB — COMPREHENSIVE METABOLIC PANEL
ALK PHOS: 59 U/L (ref 33–130)
ALT: 18 U/L (ref 6–29)
AST: 22 U/L (ref 10–35)
Albumin: 4 g/dL (ref 3.6–5.1)
BILIRUBIN TOTAL: 0.4 mg/dL (ref 0.2–1.2)
BUN: 17 mg/dL (ref 7–25)
CO2: 26 mmol/L (ref 20–31)
CREATININE: 0.84 mg/dL (ref 0.50–0.99)
Calcium: 8.9 mg/dL (ref 8.6–10.4)
Chloride: 99 mmol/L (ref 98–110)
GLUCOSE: 80 mg/dL (ref 65–99)
Potassium: 4 mmol/L (ref 3.5–5.3)
Sodium: 135 mmol/L (ref 135–146)
TOTAL PROTEIN: 7.5 g/dL (ref 6.1–8.1)

## 2014-09-10 LAB — LIPID PANEL
CHOLESTEROL: 198 mg/dL (ref 125–200)
HDL: 66 mg/dL (ref >=46)
LDL Cholesterol: 122 mg/dL (ref <130)
Total CHOL/HDL Ratio: 3 Ratio (ref <=5.0)
Triglycerides: 50 mg/dL (ref <150)
VLDL: 10 mg/dL (ref <30)

## 2014-09-10 LAB — CBC
HCT: 35.8 % — ABNORMAL LOW (ref 36.0–46.0)
HEMOGLOBIN: 11.8 g/dL — AB (ref 12.0–15.0)
MCH: 27.4 pg (ref 26.0–34.0)
MCHC: 33 g/dL (ref 30.0–36.0)
MCV: 83.1 fL (ref 78.0–100.0)
MPV: 11.4 fL (ref 8.6–12.4)
PLATELETS: 216 10*3/uL (ref 150–400)
RBC: 4.31 MIL/uL (ref 3.87–5.11)
RDW: 13.4 % (ref 11.5–15.5)
WBC: 6.7 10*3/uL (ref 4.0–10.5)

## 2014-09-10 MED ORDER — TYPHOID VACCINE PO CPDR
1.0000 | DELAYED_RELEASE_CAPSULE | ORAL | Status: DC
Start: 2014-09-10 — End: 2015-02-12

## 2014-09-10 MED ORDER — DOXYCYCLINE HYCLATE 100 MG PO CAPS
100.0000 mg | ORAL_CAPSULE | Freq: Every day | ORAL | Status: DC
Start: 2014-09-10 — End: 2015-02-12

## 2014-09-10 NOTE — Progress Notes (Signed)
[facility] [address] [place] [address] [phone]  Date:  09/10/2014   Name:  Heidi [name]   DOB:  1950/06/22   MRN:  [id number]  PCP:  Bufford Spikes, DO    Chief Complaint: No chief complaint on file.   History of Present Illness:  Heidi [name] is a 64 y.o. very pleasant female patient who presents with the following:  Here today for a 6 month follow--up visit. Last seen in January;  Lab Results  Component Value Date   HGBA1C 5.6 01/20/2014   At that time her DM was well controlled with diet  She had her reclast infusion in April- done annually for osteoporosis   She sees OBG for her well woman care Colonoscopy UTD. She gets her flu shot through her husband's job    She ate breakfast at 8am and also had some food at 11 am  She plans a trip to [place] in December- [place] be in [place]   Patient Active Problem List   Diagnosis Date Noted  . Special screening for malignant neoplasms, colon 12/10/2012  . Lymphocytosis (symptomatic)   . Disorder of bone and cartilage, unspecified   . Type II or unspecified type diabetes mellitus without mention of complication, uncontrolled   . Reflux esophagitis   . Anemia, unspecified   . Other and unspecified hyperlipidemia   . Lymphocytosis 01/16/2012  . Idiopathic eosinophilia 04/05/2011  . NONSPECIFIC ABNORMAL RESULTS LIVR FUNCTION STUDY 08/15/2008  . ANEMIA, HX OF 08/15/2008    Past Medical History  Diagnosis Date  . Allergy   . Lymphocytosis (symptomatic)   . Disorder of bone and cartilage, unspecified   . Eosinophilia   . Type II or unspecified type diabetes mellitus without mention of complication, uncontrolled   . Unspecified disorder of liver   . Pain in joint, shoulder region   . Reflux esophagitis   . Anemia, unspecified   . Other and unspecified hyperlipidemia   . Allergic rhinitis due to pollen   . Other abnormal blood chemistry   . Umbilical hernia without mention of  obstruction or gangrene     Past Surgical History  Procedure Laterality Date  . Cesarean section      Social History  Substance Use Topics  . Smoking status: Never Smoker   . Smokeless tobacco: Never Used  . Alcohol Use: No    No family history on file.  No Known Allergies  Medication list has been reviewed and updated.  Current Outpatient Prescriptions on File Prior to Visit  Medication Sig Dispense Refill  . alendronate (FOSAMAX) 70 MG tablet Take 70 mg by mouth once a week. Take with a full glass of water on an empty stomach. Rx'ed by GYN    . aspirin EC 81 MG tablet Take 81 mg by mouth daily.    . calcium-vitamin D (OSCAL WITH D) 500-200 MG-UNIT per tablet Take 1 tablet by mouth daily.    . cetirizine (ZYRTEC) 5 MG tablet Take 5 mg by mouth daily as needed for allergies.     . Multiple Vitamin (MULTIVITAMIN) tablet Take 1 tablet by mouth daily.     No current facility-administered medications on file prior to visit.    Review of Systems:  As per HPI- otherwise negative.   Physical Examination: There were no vitals filed for this visit. There were no vitals filed for this visit. There is no weight on file to calculate BMI. Ideal Body Weight:  BP 120/70 mmHg  Pulse 60  Temp(Src) 98 F (36.7 C) (Oral)  Resp 16  Ht  (1.549 m)  Wt 116 lb 12.8 oz (52.98 kg)  BMI 22.08 kg/m2  SpO2 100%  GEN: WDWN, NAD, Non-toxic, A & O x 3, normal weight, looks well HEENT: Atraumatic, Normocephalic. Neck supple. No masses, No LAD.  Bilateral TM wnl, oropharynx normal.  PEERL,EOMI.   Ears and Nose: No external deformity. CV: RRR, No M/G/R. No JVD. No thrill. No extra heart sounds. PULM: CTA B, no wheezes, crackles, rhonchi. No retractions. No resp. distress. No accessory muscle use. EXTR: No c/c/e NEURO Normal gait.  PSYCH: Normally interactive. Conversant. Not depressed or anxious appearing.  Calm demeanor.    Assessment and Plan: Diabetes mellitus type 2,  controlled - Plan: Comprehensive metabolic panel, Hemoglobin A1c  Screening examination for infectious disease - Plan: Hepatitis C antibody  Screening for deficiency anemia - Plan: CBC  Hyperlipidemia - Plan: Comprehensive metabolic panel, Lipid panel  Immunization due  Travel advice encounter - Plan: doxycycline (VIBRAMYCIN) 100 MG capsule, typhoid (VIVOTIF) DR capsule  Labs as above She is planning a trip to [place]- discussed needed immunizations and medications rx for vivotif, doxycycline for malaria prophylaxis See patient instructions for more details.     Signed Abbe Amsterdam, MD

## 2014-09-10 NOTE — Patient Instructions (Addendum)
Good to see you today- I will be in touch with your labs asap.  We did your one- time Hep C screening today- this is recommended for everyone  You could also get a shingles vaccine at any time at your drug store- gave you a paper rx for this today   As a diabetic, there are several things you can do to monitor your condition and maintain your health.  1. Check your feet daily for any skin breakdown 2. Exercise and keep track of your diet 3. Let us know before you run out of your medications 4. Get your annual flu shot, and ask if you need a pneumonia shot 5. Ask if you are up to date on your labs; you should have an A1c every 6 months, a urine protein test annually, and a cholesterol test annually.  Your doctor may decide to do labs more often if indicated 6. Take off your shoes and socks at each visit.  Be sure your doctor examines your feet.   7. Ask about your blood pressure.  Your goal is 130/ 80 or less 8. Get an annual eye exam.  Please ask your ophthalmologist to send [place] your report 9. Keep up with your dental cleanings and exams.     Take the typhoid oral vaccine over the next month or so- take it every other day for 4 doses (8 days) The doxycycline rx is to prevent malaria- start it 1-2 days prior to arriving in country, continue for 28 days once you are home I would recommend that you contact the travel clinic at the local Health Department if you do not have documentation of your most recent yellow fever vaccine Also please try to find your hepatitis vaccines so I can make sure you are all up to date

## 2014-09-11 ENCOUNTER — Encounter: Payer: Self-pay | Admitting: Family Medicine

## 2014-09-11 LAB — HEMOGLOBIN A1C
HEMOGLOBIN A1C: 5.8 % — AB (ref <5.7)
MEAN PLASMA GLUCOSE: 120 mg/dL — AB (ref <117)

## 2014-09-11 LAB — HEPATITIS C ANTIBODY: HCV AB: NEGATIVE

## 2014-11-17 IMAGING — CT CT HEAD W/O CM
2 series · 15 of 30 positions shown, 19 images · non-contrast
Comparison: CT brain scan of 12/16/2003

CLINICAL DATA: Dizziness, nausea and vomiting for 2 days

EXAM:
CT HEAD WITHOUT CONTRAST
TECHNIQUE: Contiguous axial images were obtained from the base of the skull
through the vertex without intravenous contrast.

[Series 2: head w/o · axial · non-contrast · 0.49mm/px · z∈[+24,+146]mm · 13 of 28 slices shown, 17 images]
[im 2/28  brain]
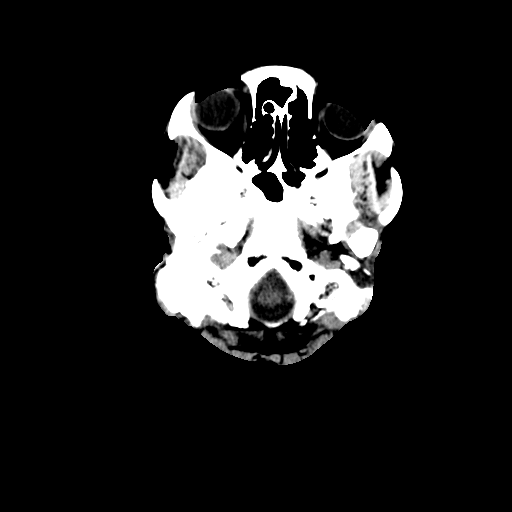
[im 2/28  bone]
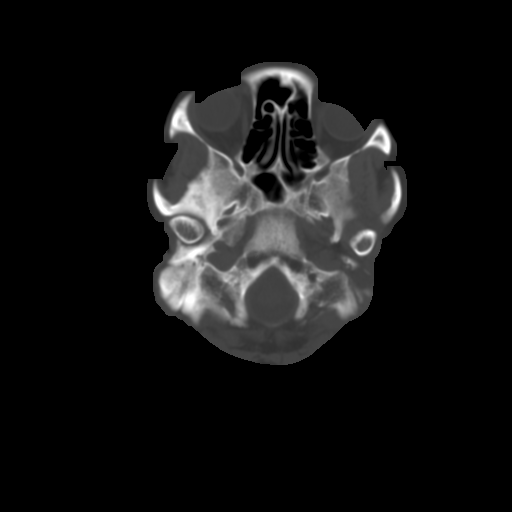
[im 4/28  brain]
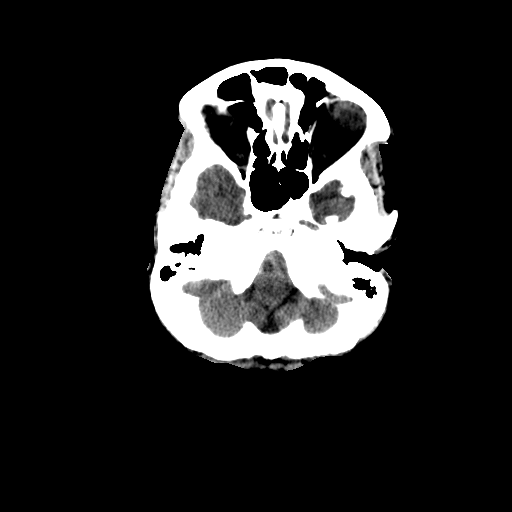
[im 6/28  brain]
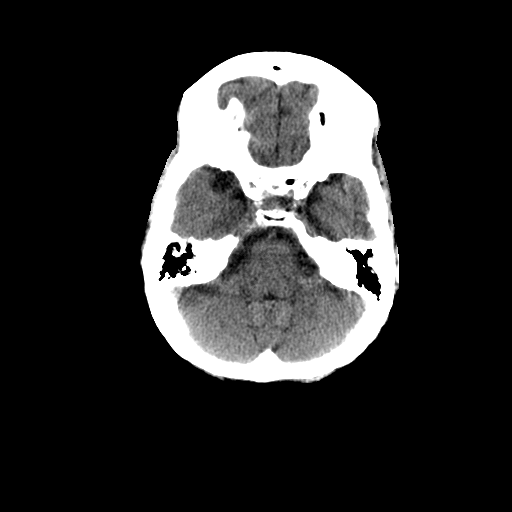
[im 8/28  brain]
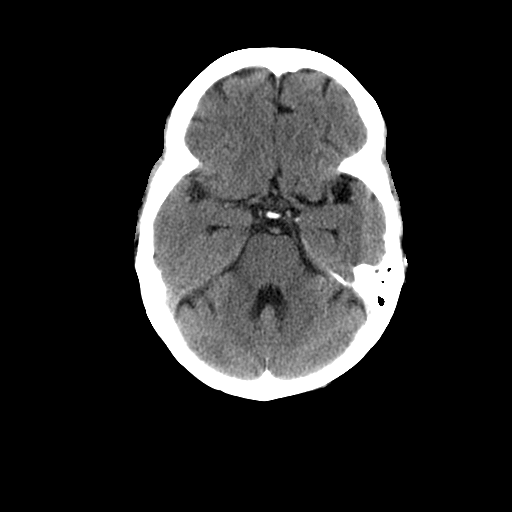
[im 10/28  brain]
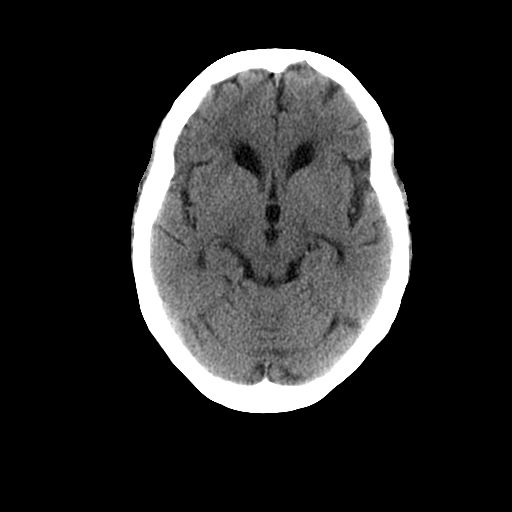
[im 10/28  bone]
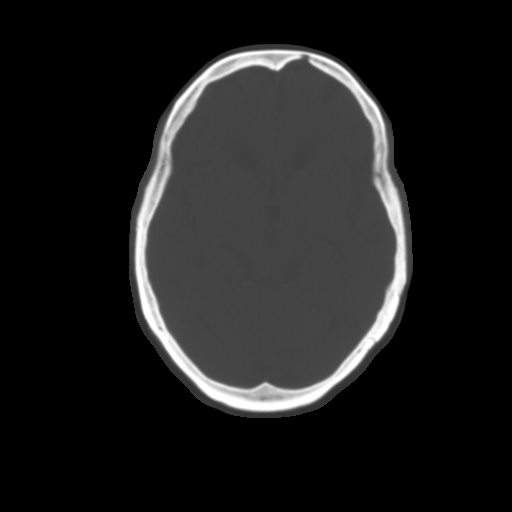
[im 12/28  brain]
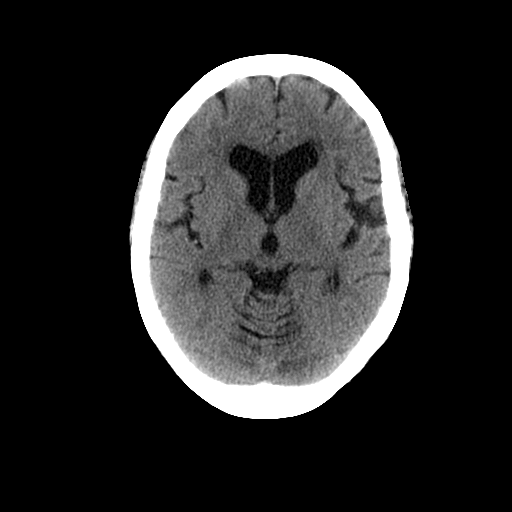
[im 14/28  brain]
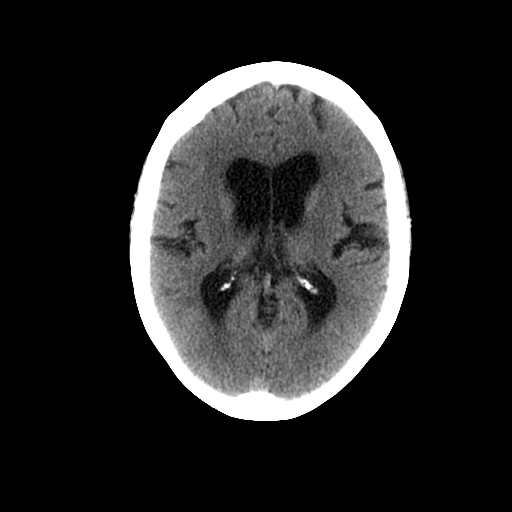
[im 16/28  brain]
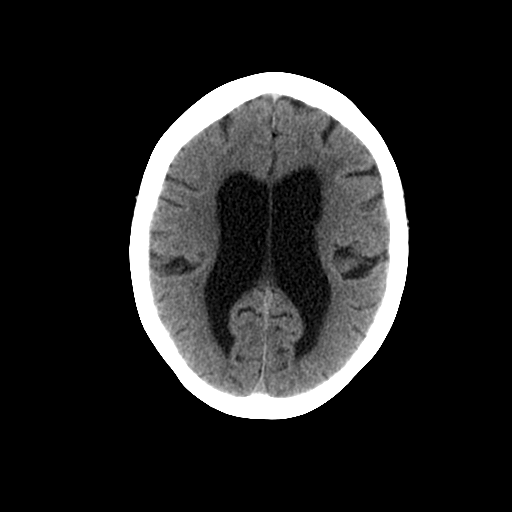
[im 18/28  brain]
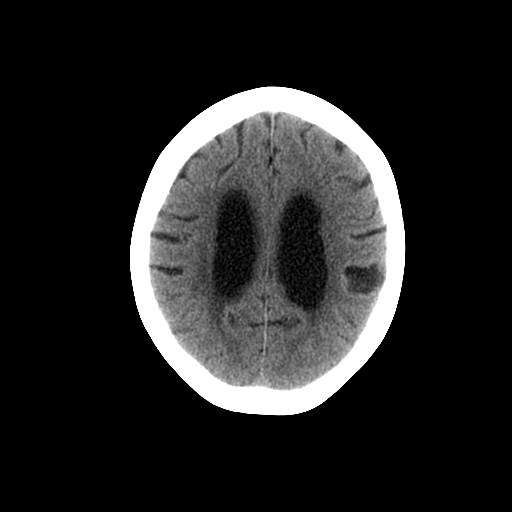
[im 18/28  bone]
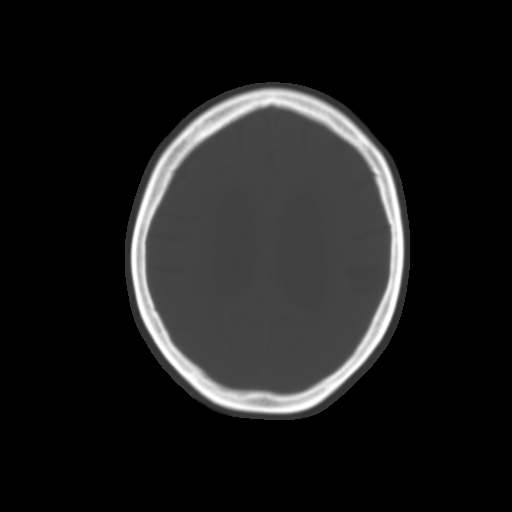
[im 20/28  brain]
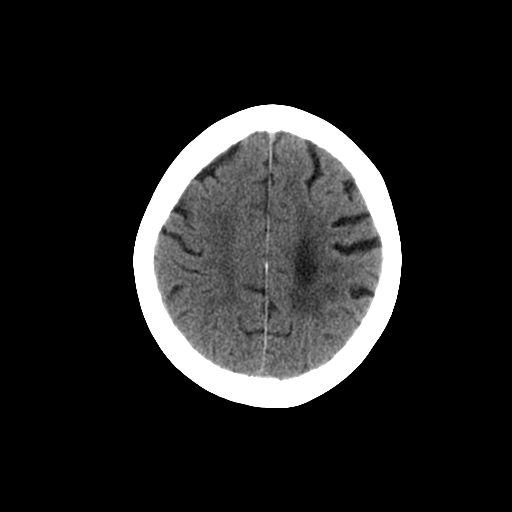
[im 22/28  brain]
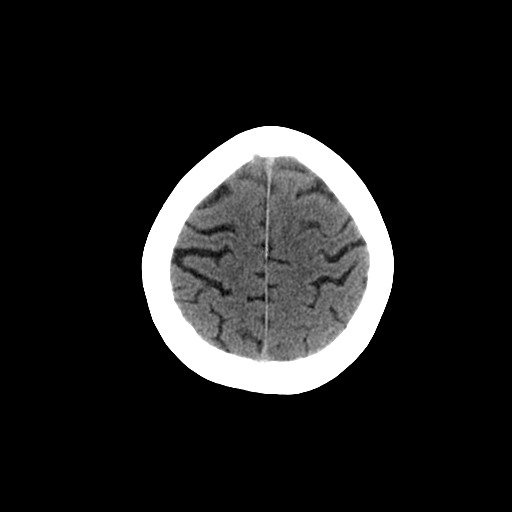
[im 24/28  brain]
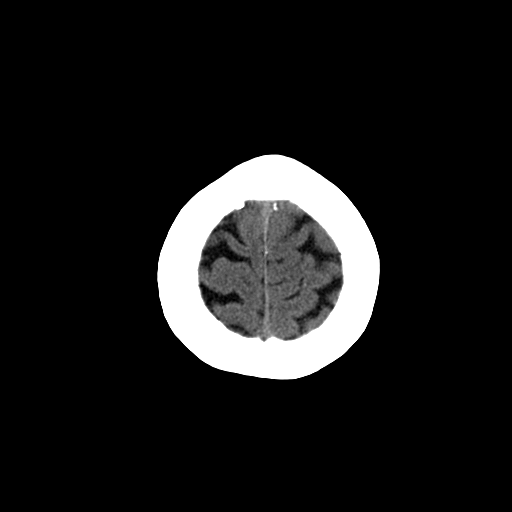
[im 26/28  brain]
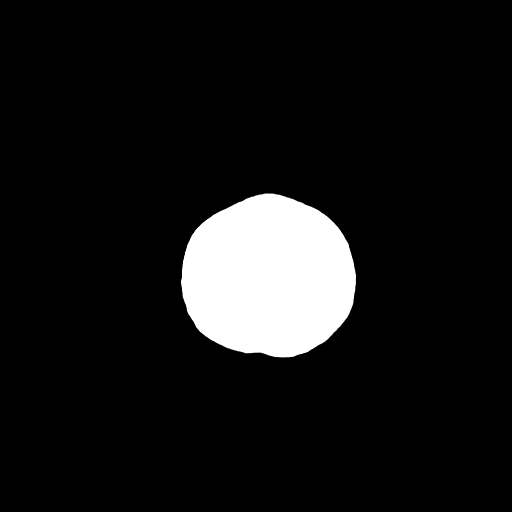
[im 26/28  bone]
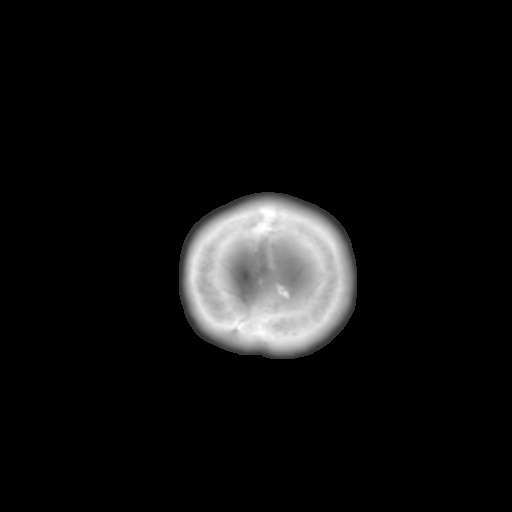

[Series 3: head bone · axial · 0.49mm/px · z∈[+24,+44]mm · 2 of 28 slices shown]
[im 2/28  bone]
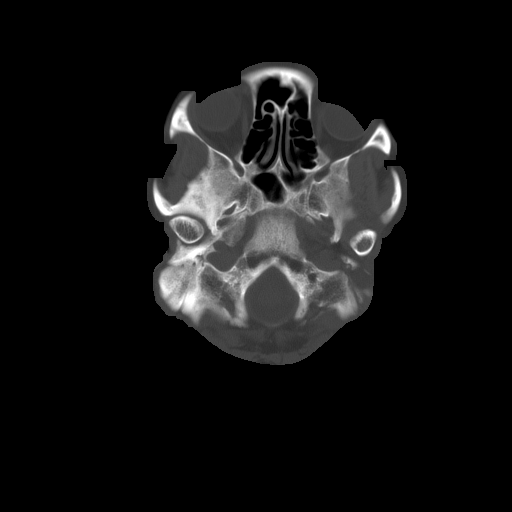
[im 6/28  bone]
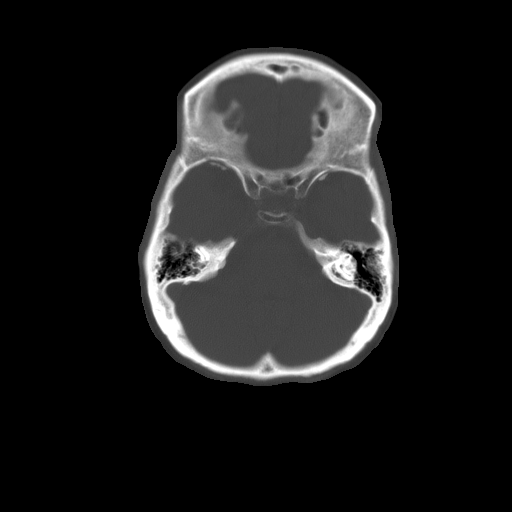

[15 of 30 positions shown; findings below may reference images not displayed]

FINDINGS: The ventricular system is diffusely prominent, slightly more dilated
than on the prior CT with the anterior horns measuring 45 mm in
diameter compared to 41 mm previously. Also, this dilatation appears
to be disproportionate relative to the cortical atrophy. Is there
any clinical suspicion of normal pressure hydrocephalus? The septum
remains midline in position. Mild cortical and cerebellar atrophy is
present. As noted previously there is moderate small vessel ischemic
change within the periventricular white matter. No hemorrhage, mass
lesion, or acute infarction is seen. On bone window images, no acute
calvarial abnormality is seen. The paranasal sinuses which are
visualized appear pneumatized.
IMPRESSION: 1. Ventricular dilatation is again noted, slightly disproportionate
to the degree of diffuse cortical atrophy. Is there any clinical
suspicion of normal pressure hydrocephalus?
2. Atrophy and moderate small vessel ischemic change.

## 2015-02-12 ENCOUNTER — Ambulatory Visit (INDEPENDENT_AMBULATORY_CARE_PROVIDER_SITE_OTHER): Payer: BC Managed Care – PPO | Admitting: Urgent Care

## 2015-02-12 VITALS — BP 112/80 | HR 61 | Temp 97.2°F | Resp 16 | Ht 60.5 in | Wt 117.4 lb

## 2015-02-12 DIAGNOSIS — R140 Abdominal distension (gaseous): Secondary | ICD-10-CM | POA: Diagnosis not present

## 2015-02-12 DIAGNOSIS — R112 Nausea with vomiting, unspecified: Secondary | ICD-10-CM

## 2015-02-12 DIAGNOSIS — Z8719 Personal history of other diseases of the digestive system: Secondary | ICD-10-CM | POA: Diagnosis not present

## 2015-02-12 NOTE — Progress Notes (Signed)
    MRN: [id number] DOB: 07-07-1950  Subjective:   Heidi [name] is a 65 y.o. female presenting for chief complaint of Other; Abdominal Pain; and Emesis  Reports 1 day history of nausea with vomiting x1, bloating for the past 2-3 days. Has tried Zofran with significant relief. For her bloating she has used Tums with significant relief as well. Denies fever, belly pain, diarrhea, chest pain, cough, body aches, headache. Patient has a history of reflux esophagitis, does not take anything consistently for this, does not try to avoid GERD causing foods.  Heidi [name] has a current medication list which includes the following prescription(s): aspirin ec, calcium-vitamin d, cetirizine, and multivitamin. Also has No Known Allergies.  Heidi [name]  has a past medical history of Allergy; Lymphocytosis (symptomatic); Disorder of bone and cartilage, unspecified; Eosinophilia; Type II or unspecified type diabetes mellitus without mention of complication, uncontrolled; Unspecified disorder of liver; Pain in joint, shoulder region; Reflux esophagitis; Anemia, unspecified; Other and unspecified hyperlipidemia; Allergic rhinitis due to pollen; Other abnormal blood chemistry; and Umbilical hernia without mention of obstruction or gangrene. Also  has past surgical history that includes Cesarean section.  Objective:   Vitals: BP 112/80 mmHg  Pulse 61  Temp(Src) 97.2 F (36.2 C)  Resp 16  Ht 5' 0.5" (1.537 m)  Wt 117 lb 6.4 oz (53.252 kg)  BMI 22.54 kg/m2  SpO2 98%  Physical Exam  Constitutional: She is oriented to person, place, and time. She appears well-developed and well-nourished.  HENT:  Mouth/Throat: Oropharynx is clear and moist.  Cardiovascular: Normal rate, regular rhythm and intact distal pulses.  Exam reveals no gallop and no friction rub.   No murmur heard. Pulmonary/Chest: No respiratory distress. She has no wheezes. She has no rales.  Abdominal: Soft. Bowel sounds are normal. She exhibits no  distension and no mass. There is no tenderness.  Neurological: She is alert and oriented to person, place, and time.  Skin: Skin is warm and dry.   Assessment and Plan :   1. Nausea and vomiting, vomiting of unspecified type 2. Bloating 3. History of esophageal reflux - Discussed differential with patient, she is to start Pepcid, read through visit summary about GERD. She declined further work up today but agreed to rtc in 2 weeks if Pepcid is not helping, consider blood work and/or imaging as appropriate at that time.  Heidi Bamberg, PA-C [facility] [phone] 02/12/2015 1:05 PM

## 2015-02-12 NOTE — Patient Instructions (Addendum)
Food Choices for Gastroesophageal Reflux Disease, Adult When you have gastroesophageal reflux disease (GERD), the foods you eat and your eating habits are very important. Choosing the right foods can help ease the discomfort of GERD. WHAT GENERAL GUIDELINES DO I NEED TO FOLLOW?  Choose fruits, vegetables, whole grains, low-fat dairy products, and low-fat meat, fish, and poultry.  Limit fats such as oils, salad dressings, butter, nuts, and avocado.  Keep a food diary to identify foods that cause symptoms.  Avoid foods that cause reflux. These may be different for different people.  Eat frequent small meals instead of three large meals each day.  Eat your meals slowly, in a relaxed setting.  Limit fried foods.  Cook foods using methods other than frying.  Avoid drinking alcohol.  Avoid drinking large amounts of liquids with your meals.  Avoid bending over or lying down until 2-3 hours after eating. WHAT FOODS ARE NOT RECOMMENDED? The following are some foods and drinks that may worsen your symptoms: Vegetables Tomatoes. Tomato juice. Tomato and spaghetti sauce. Chili peppers. Onion and garlic. Horseradish. Fruits Oranges, grapefruit, and lemon (fruit and juice). Meats High-fat meats, fish, and poultry. This includes hot dogs, ribs, ham, sausage, salami, and bacon. Dairy Whole milk and chocolate milk. Sour cream. Cream. Butter. Ice cream. Cream cheese.  Beverages Coffee and tea, with or without caffeine. Carbonated beverages or energy drinks. Condiments Hot sauce. Barbecue sauce.  Sweets/Desserts Chocolate and cocoa. Donuts. Peppermint and spearmint. Fats and Oils High-fat foods, including [place] fries and potato chips. Other Vinegar. Strong spices, such as black pepper, white pepper, red pepper, cayenne, curry powder, cloves, ginger, and chili powder. The items listed above may not be a complete list of foods and beverages to avoid. Contact your dietitian for more  information.   This information is not intended to replace advice given to you by your health care provider. Make sure you discuss any questions you have with your health care provider.   Document Released: 12/20/2004 Document Revised: 01/10/2014 Document Reviewed: 10/24/2012 Elsevier Interactive Patient Education 2016 Elsevier Inc.    Famotidine tablets or gelcaps What is this medicine? FAMOTIDINE (fa MOE ti deen) is a type of antihistamine that blocks the release of stomach acid. It is used to treat stomach or intestinal ulcers. It can also relieve heartburn from acid reflux. This medicine may be used for other purposes; ask your health care provider or pharmacist if you have questions. What should I tell my health care provider before I take this medicine? They need to know if you have any of these conditions: -kidney or liver disease -trouble swallowing -an unusual or allergic reaction to famotidine, other medicines, foods, dyes, or preservatives -pregnant or trying to get pregnant -breast-feeding How should I use this medicine? Take this medicine by mouth with a glass of water. Follow the directions on the prescription label. If you only take this medicine once a day, take it at bedtime. Take your doses at regular intervals. Do not take your medicine more often than directed. Talk to your pediatrician regarding the use of this medicine in children. Special care may be needed. Overdosage: If you think you have taken too much of this medicine contact a poison control center or emergency room at once. NOTE: This medicine is only for you. Do not share this medicine with others. What if I miss a dose? If you miss a dose, take it as soon as you can. If it is almost time for your next dose, take  only that dose. Do not take double or extra doses. What may interact with this medicine? -delavirdine -itraconazole -ketoconazole This list may not describe all possible interactions. Give your  health care provider a list of all the medicines, herbs, non-prescription drugs, or dietary supplements you use. Also tell them if you smoke, drink alcohol, or use illegal drugs. Some items may interact with your medicine. What should I watch for while using this medicine? Tell your doctor or health care professional if your condition does not start to get better or if it gets worse. Finish the full course of tablets prescribed, even if you feel better. Do not take with aspirin, ibuprofen or other antiinflammatory medicines. These can make your condition worse. Do not smoke cigarettes or drink alcohol. These cause irritation in your stomach and can increase the time it will take for ulcers to heal. If you get black, tarry stools or vomit up what looks like coffee grounds, call your doctor or health care professional at once. You may have a bleeding ulcer. What side effects may I notice from receiving this medicine? Side effects that you should report to your doctor or health care professional as soon as possible: -agitation, nervousness -confusion -hallucinations -skin rash, itching Side effects that usually do not require medical attention (report to your doctor or health care professional if they continue or are bothersome): -constipation -diarrhea -dizziness -headache This list may not describe all possible side effects. Call your doctor for medical advice about side effects. You may report side effects to FDA at 1-800-FDA-1088. Where should I keep my medicine? Keep out of the reach of children. Store at room temperature between 15 and 30 degrees C (59 and 86 degrees F). Do not freeze. Throw away any unused medicine after the expiration date. NOTE: This sheet is a summary. It may not cover all possible information. If you have questions about this medicine, talk to your doctor, pharmacist, or health care provider.    2016, Elsevier/Gold Standard. (2012-04-11 14:45:49)

## 2015-02-13 ENCOUNTER — Ambulatory Visit (INDEPENDENT_AMBULATORY_CARE_PROVIDER_SITE_OTHER): Payer: BC Managed Care – PPO | Admitting: Physician Assistant

## 2015-02-13 VITALS — BP 128/80 | HR 67 | Temp 98.3°F | Resp 20 | Ht 61.0 in | Wt 117.4 lb

## 2015-02-13 DIAGNOSIS — A084 Viral intestinal infection, unspecified: Secondary | ICD-10-CM

## 2015-02-13 DIAGNOSIS — G43A0 Cyclical vomiting, not intractable: Secondary | ICD-10-CM | POA: Diagnosis not present

## 2015-02-13 DIAGNOSIS — E860 Dehydration: Secondary | ICD-10-CM | POA: Diagnosis not present

## 2015-02-13 DIAGNOSIS — R1115 Cyclical vomiting syndrome unrelated to migraine: Secondary | ICD-10-CM

## 2015-02-13 LAB — POCT CBC
Granulocyte percent: 35.9 %G — AB (ref 37–80)
HEMATOCRIT: 37.8 % (ref 37.7–47.9)
HEMOGLOBIN: 13 g/dL (ref 12.2–16.2)
Lymph, poc: 2 (ref 0.6–3.4)
MCH: 28.1 pg (ref 27–31.2)
MCHC: 34.4 g/dL (ref 31.8–35.4)
MCV: 81.8 fL (ref 80–97)
MID (CBC): 0.3 (ref 0–0.9)
MPV: 8.1 fL (ref 0–99.8)
POC GRANULOCYTE: 1.3 — AB (ref 2–6.9)
POC LYMPH PERCENT: 56.2 %L — AB (ref 10–50)
POC MID %: 7.9 % (ref 0–12)
Platelet Count, POC: 226 10*3/uL (ref 142–424)
RBC: 4.63 M/uL (ref 4.04–5.48)
RDW, POC: 12.5 %
WBC: 3.5 10*3/uL — AB (ref 4.6–10.2)

## 2015-02-13 LAB — POCT URINALYSIS DIP (MANUAL ENTRY)
BILIRUBIN UA: NEGATIVE
GLUCOSE UA: NEGATIVE
Leukocytes, UA: NEGATIVE
Nitrite, UA: NEGATIVE
Protein Ur, POC: 30 — AB
SPEC GRAV UA: 1.02
UROBILINOGEN UA: 1
pH, UA: 5.5

## 2015-02-13 LAB — GLUCOSE, POCT (MANUAL RESULT ENTRY): POC Glucose: 86 mg/dl (ref 70–99)

## 2015-02-13 NOTE — Progress Notes (Signed)
02/13/2015 6:56 PM   DOB: Feb 08, 1950 / MRN: [id number]  SUBJECTIVE:  Heidi [name] is a 65 y.o. female presenting for 3 days of nausea and emesis.  Associates anorexia and mild dizziness with standing. She is stooling normally and denies melena and BRBPR.  Denies fever, chills and abdominal pain.  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  She has tried H2 blocker and Zofran with some relief.   She has No Known Allergies.   She  has a past medical history of Allergy; Lymphocytosis (symptomatic); Disorder of bone and cartilage, unspecified; Eosinophilia; Type II or unspecified type diabetes mellitus without mention of complication, uncontrolled; Unspecified disorder of liver; Pain in joint, shoulder region; Reflux esophagitis; Anemia, unspecified; Other and unspecified hyperlipidemia; Allergic rhinitis due to pollen; Other abnormal blood chemistry; and Umbilical hernia without mention of obstruction or gangrene.    She  reports that she has never smoked. She has never used smokeless tobacco. She reports that she does not drink alcohol or use illicit drugs. She  reports that she currently engages in sexual activity and has had female partners. The patient  has past surgical history that includes Cesarean section.  Her family history is not on file.  Review of Systems  Constitutional: Negative for fever and chills.  Eyes: Negative for blurred vision.  Respiratory: Negative for cough and shortness of breath.   Cardiovascular: Negative for chest pain.  Gastrointestinal: Positive for nausea and vomiting. Negative for abdominal pain.  Genitourinary: Negative for dysuria, urgency and frequency.  Musculoskeletal: Negative for myalgias.  Skin: Negative for rash.  Neurological: Negative for dizziness, tingling and headaches.  Psychiatric/Behavioral: Negative for depression. The patient is not nervous/anxious.     Problem list and medications reviewed and updated by myself where  necessary, and exist elsewhere in the encounter.   OBJECTIVE:  BP 128/80 mmHg  Pulse 67  Temp(Src) 98.3 F (36.8 C) (Oral)  Resp 20  Ht  (1.549 m)  Wt 117 lb 6.4 oz (53.252 kg)  BMI 22.19 kg/m2  SpO2 97%  Physical Exam  Constitutional: She is oriented to person, place, and time. She appears well-nourished. No distress.  Eyes: EOM are normal. Pupils are equal, round, and reactive to light.  Cardiovascular: Normal rate, regular rhythm, normal heart sounds and intact distal pulses.   Pulmonary/Chest: Effort normal. No respiratory distress. She has no wheezes. She has no rales. She exhibits no tenderness.  Abdominal: Soft. Bowel sounds are normal. She exhibits no distension.  Neurological: She is alert and oriented to person, place, and time. No cranial nerve deficit. Gait normal.  Skin: Skin is dry. She is not diaphoretic.  Psychiatric: She has a normal mood and affect.  Vitals reviewed.   Results for orders placed or performed in visit on 02/13/15 (from the past 72 hour(s))  POCT CBC     Status: Abnormal   Collection Time: 02/13/15  6:32 PM  Result Value Ref Range   WBC 3.5 (A) 4.6 - 10.2 K/uL   Lymph, poc 2.0 0.6 - 3.4   POC LYMPH PERCENT 56.2 (A) 10 - 50 %L   MID (cbc) 0.3 0 - 0.9   POC MID % 7.9 0 - 12 %M   POC Granulocyte 1.3 (A) 2 - 6.9   Granulocyte percent 35.9 (A) 37 - 80 %G   RBC 4.63 4.04 - 5.48 M/uL   Hemoglobin 13.0 12.2 - 16.2 g/dL   HCT, POC 64.4 03.4 - 47.9 %  MCV 81.8 80 - 97 fL   MCH, POC 28.1 27 - 31.2 pg   MCHC 34.4 31.8 - 35.4 g/dL   RDW, POC 09.8 %   Platelet Count, POC 226 142 - 424 K/uL   MPV 8.1 0 - 99.8 fL  POCT glucose (manual entry)     Status: None   Collection Time: 02/13/15  6:32 PM  Result Value Ref Range   POC Glucose 86 70 - 99 mg/dl  POCT urinalysis dipstick     Status: Abnormal   Collection Time: 02/13/15  6:32 PM  Result Value Ref Range   Color, UA yellow yellow   Clarity, UA cloudy (A) clear   Glucose, UA negative  negative   Bilirubin, UA negative negative   Ketones, POC UA small (15) (A) negative   Spec Grav, UA 1.020    Blood, UA trace-lysed (A) negative   pH, UA 5.5    Protein Ur, POC =30 (A) negative   Urobilinogen, UA 1.0    Nitrite, UA Negative Negative   Leukocytes, UA Negative Negative   No results found.  ASSESSMENT AND PLAN  [name] was seen today for nausea and emesis.  Diagnoses and all orders for this visit:  Non-intractable cyclical vomiting with nausea:  -     POCT CBC -     POCT glucose (manual entry) -     POCT urinalysis dipstick -     COMPLETE METABOLIC PANEL WITH GFR -     Lipase  Viral gastroenteritis: Evidenced by lymphocyte proliferation. Advised BRAT diet for now and to push fluids. Will see her back in 5 or so days if she remains symptomatic.   Dehydration: 1 liter normal saline with good symptomatic improvement.      The patient was advised to call or return to clinic if she does not see an improvement in symptoms or to seek the care of the closest emergency department if she worsens with the above plan.   Deliah Boston, MHS, PA-C [facility] 02/13/2015 6:56 PM

## 2015-02-13 NOTE — Patient Instructions (Signed)

## 2015-02-14 ENCOUNTER — Telehealth: Payer: Self-pay | Admitting: Internal Medicine

## 2015-02-14 LAB — COMPLETE METABOLIC PANEL WITH GFR
ALBUMIN: 4.2 g/dL (ref 3.6–5.1)
ALK PHOS: 65 U/L (ref 33–130)
ALT: 24 U/L (ref 6–29)
AST: 31 U/L (ref 10–35)
BILIRUBIN TOTAL: 0.6 mg/dL (ref 0.2–1.2)
BUN: 12 mg/dL (ref 7–25)
CALCIUM: 8.6 mg/dL (ref 8.6–10.4)
CO2: 23 mmol/L (ref 20–31)
Chloride: 85 mmol/L — ABNORMAL LOW (ref 98–110)
Creat: 0.92 mg/dL (ref 0.50–0.99)
GFR, EST NON AFRICAN AMERICAN: 66 mL/min (ref >=60)
GFR, Est African American: 77 mL/min (ref >=60)
GLUCOSE: 81 mg/dL (ref 65–99)
Potassium: 4.5 mmol/L (ref 3.5–5.3)
Sodium: 113 mmol/L — CL (ref 135–146)
TOTAL PROTEIN: 7.8 g/dL (ref 6.1–8.1)

## 2015-02-14 LAB — LIPASE: LIPASE: 21 U/L (ref 7–60)

## 2015-02-14 NOTE — Telephone Encounter (Signed)
Results for orders placed or performed in visit on 02/13/15  COMPLETE METABOLIC PANEL WITH GFR  Result Value Ref Range   Sodium 113 (LL) 135 - 146 mmol/L   Potassium 4.5 3.5 - 5.3 mmol/L   Chloride 85 (L) 98 - 110 mmol/L   CO2 23 20 - 31 mmol/L   Glucose, Bld 81 65 - 99 mg/dL   BUN 12 7 - 25 mg/dL   Creat 8.11 9.14 - 7.82 mg/dL   Total Bilirubin 0.6 0.2 - 1.2 mg/dL   Alkaline Phosphatase 65 33 - 130 U/L   AST 31 10 - 35 U/L   ALT 24 6 - 29 U/L   Total Protein 7.8 6.1 - 8.1 g/dL   Albumin 4.2 3.6 - 5.1 g/dL   Calcium 8.6 8.6 - 95.6 mg/dL   GFR, Est African American 77 >=60 mL/min   GFR, Est Non African American 66 >=60 mL/min  Lipase  Result Value Ref Range   Lipase 21 7 - 60 U/L  POCT CBC  Result Value Ref Range   WBC 3.5 (A) 4.6 - 10.2 K/uL   Lymph, poc 2.0 0.6 - 3.4   POC LYMPH PERCENT 56.2 (A) 10 - 50 %L   MID (cbc) 0.3 0 - 0.9   POC MID % 7.9 0 - 12 %M   POC Granulocyte 1.3 (A) 2 - 6.9   Granulocyte percent 35.9 (A) 37 - 80 %G   RBC 4.63 4.04 - 5.48 M/uL   Hemoglobin 13.0 12.2 - 16.2 g/dL   HCT, POC 21.3 08.6 - 47.9 %   MCV 81.8 80 - 97 fL   MCH, POC 28.1 27 - 31.2 pg   MCHC 34.4 31.8 - 35.4 g/dL   RDW, POC 57.8 %   Platelet Count, POC 226 142 - 424 K/uL   MPV 8.1 0 - 99.8 fL  POCT glucose (manual entry)  Result Value Ref Range   POC Glucose 86 70 - 99 mg/dl  POCT urinalysis dipstick  Result Value Ref Range   Color, UA yellow yellow   Clarity, UA cloudy (A) clear   Glucose, UA negative negative   Bilirubin, UA negative negative   Ketones, POC UA small (15) (A) negative   Spec Grav, UA 1.020    Blood, UA trace-lysed (A) negative   pH, UA 5.5    Protein Ur, POC =30 (A) negative   Urobilinogen, UA 1.0    Nitrite, UA Negative Negative   Leukocytes, UA Negative Negative   Heidi [name] has already spoken to her--stable --lab repeat tomorrow

## 2015-02-15 ENCOUNTER — Ambulatory Visit (INDEPENDENT_AMBULATORY_CARE_PROVIDER_SITE_OTHER): Payer: BC Managed Care – PPO | Admitting: Physician Assistant

## 2015-02-15 ENCOUNTER — Other Ambulatory Visit: Payer: Self-pay | Admitting: Physician Assistant

## 2015-02-15 ENCOUNTER — Telehealth: Payer: Self-pay | Admitting: Internal Medicine

## 2015-02-15 VITALS — BP 134/80 | HR 69 | Temp 98.0°F | Resp 19 | Ht 61.0 in | Wt 115.8 lb

## 2015-02-15 DIAGNOSIS — E871 Hypo-osmolality and hyponatremia: Secondary | ICD-10-CM | POA: Diagnosis not present

## 2015-02-15 LAB — COMPLETE METABOLIC PANEL WITH GFR
ALT: 28 U/L (ref 6–29)
AST: 33 U/L (ref 10–35)
Albumin: 3.9 g/dL (ref 3.6–5.1)
Alkaline Phosphatase: 80 U/L (ref 33–130)
BILIRUBIN TOTAL: 0.3 mg/dL (ref 0.2–1.2)
BUN: 10 mg/dL (ref 7–25)
CALCIUM: 8.2 mg/dL — AB (ref 8.6–10.4)
CHLORIDE: 95 mmol/L — AB (ref 98–110)
CO2: 27 mmol/L (ref 20–31)
CREATININE: 0.92 mg/dL (ref 0.50–0.99)
GFR, EST AFRICAN AMERICAN: 77 mL/min (ref >=60)
GFR, EST NON AFRICAN AMERICAN: 66 mL/min (ref >=60)
Glucose, Bld: 82 mg/dL (ref 65–99)
Potassium: 3.7 mmol/L (ref 3.5–5.3)
Sodium: 127 mmol/L — ABNORMAL LOW (ref 135–146)
Total Protein: 7.4 g/dL (ref 6.1–8.1)

## 2015-02-15 LAB — OSMOLALITY, URINE: Osmolality, Ur: 546 mOsm/kg (ref 390–1090)

## 2015-02-15 LAB — POCT CBC
GRANULOCYTE PERCENT: 27.9 % — AB (ref 37–80)
HEMATOCRIT: 36.8 % — AB (ref 37.7–47.9)
Hemoglobin: 12.6 g/dL (ref 12.2–16.2)
LYMPH, POC: 2.9 (ref 0.6–3.4)
MCH, POC: 27.6 pg (ref 27–31.2)
MCHC: 34.2 g/dL (ref 31.8–35.4)
MCV: 80.7 fL (ref 80–97)
MID (cbc): 0.3 (ref 0–0.9)
MPV: 8.4 fL (ref 0–99.8)
POC GRANULOCYTE: 1.2 — AB (ref 2–6.9)
POC LYMPH %: 66 % — AB (ref 10–50)
POC MID %: 6.1 % (ref 0–12)
Platelet Count, POC: 233 10*3/uL (ref 142–424)
RBC: 4.56 M/uL (ref 4.04–5.48)
RDW, POC: 12.8 %
WBC: 4.4 10*3/uL — AB (ref 4.6–10.2)

## 2015-02-15 LAB — OSMOLALITY: OSMOLALITY: 269 mosm/kg — AB (ref 275–300)

## 2015-02-15 NOTE — Progress Notes (Signed)
02/15/2015 11:55 AM   DOB: [birth year] / MRN: [id number]  SUBJECTIVE:  Heidi [name] is a 64 y.o. female presenting for follow up of an abnormal sodium value drawn two days ago.  This was drawn for the eval of nausea thought to be abdominal in etiology. She was called at the time the Na came back to me and reported feeling much better.  She feels very well today.   She has No Known Allergies.   She  has a past medical history of Allergy; Lymphocytosis (symptomatic); Disorder of bone and cartilage, unspecified; Eosinophilia; Type II or unspecified type diabetes mellitus without mention of complication, uncontrolled; Unspecified disorder of liver; Pain in joint, shoulder region; Reflux esophagitis; Anemia, unspecified; Other and unspecified hyperlipidemia; Allergic rhinitis due to pollen; Other abnormal blood chemistry; and Umbilical hernia without mention of obstruction or gangrene.    She  reports that she has never smoked. She has never used smokeless tobacco. She reports that she does not drink alcohol or use illicit drugs. She  reports that she currently engages in sexual activity and has had female partners. The patient  has past surgical history that includes Cesarean section.  Her family history is not on file.  ROS  Problem list and medications reviewed and updated by myself where necessary, and exist elsewhere in the encounter.   OBJECTIVE:  BP 134/80 mmHg  Pulse 69  Temp(Src) 98 F (36.7 C) (Oral)  Resp 19  Ht _0  (1.549 m)  Wt 115 lb 12.8 oz (52.527 kg)  BMI 21.89 kg/m2  SpO2 95%  Physical Exam  Constitutional: She appears well-developed.  HENT:  Mouth/Throat: No oropharyngeal exudate.  Eyes: EOM are normal. Pupils are equal, round, and reactive to light.  Cardiovascular: Normal rate and regular rhythm.   Pulmonary/Chest: Effort normal and breath sounds normal.  Abdominal: Soft. Bowel sounds are normal.  Musculoskeletal: Normal range of motion.  Skin: Skin is warm.  No rash noted. She is not diaphoretic.  Psychiatric: She has a normal mood and affect.    Results for orders placed or performed in visit on 02/13/15 (from the past 72 hour(s))  COMPLETE METABOLIC PANEL WITH GFR     Status: Abnormal   Collection Time: 02/13/15  6:22 PM  Result Value Ref Range   Sodium 113 (LL) 135 - 146 mmol/L    Comment: Result repeated and verified.   Potassium 4.5 3.5 - 5.3 mmol/L   Chloride 85 (L) 98 - 110 mmol/L   CO2 23 20 - 31 mmol/L   Glucose, Bld 81 65 - 99 mg/dL   BUN 12 7 - 25 mg/dL   Creat 0.92 0.50 - 0.99 mg/dL   Total Bilirubin 0.6 0.2 - 1.2 mg/dL   Alkaline Phosphatase 65 33 - 130 U/L   AST 31 10 - 35 U/L   ALT 24 6 - 29 U/L   Total Protein 7.8 6.1 - 8.1 g/dL   Albumin 4.2 3.6 - 5.1 g/dL   Calcium 8.6 8.6 - 10.4 mg/dL   GFR, Est African American 77 >=60 mL/min   GFR, Est Non African American 66 >=60 mL/min    Comment:   The estimated GFR is a calculation valid for adults (>=69 years old) that uses the CKD-EPI algorithm to adjust for age and sex. It is   not to be used for children, pregnant women, hospitalized patients,    patients on dialysis, or with rapidly changing kidney function. According to the NKDEP, eGFR >89 is normal,  60-89 shows mild impairment, 30-59 shows moderate impairment, 15-29 shows severe impairment and <15 is ESRD.     Lipase     Status: None   Collection Time: 02/13/15  6:22 PM  Result Value Ref Range   Lipase 21 7 - 60 U/L  POCT CBC     Status: Abnormal   Collection Time: 02/13/15  6:32 PM  Result Value Ref Range   WBC 3.5 (A) 4.6 - 10.2 K/uL   Lymph, poc 2.0 0.6 - 3.4   POC LYMPH PERCENT 56.2 (A) 10 - 50 %L   MID (cbc) 0.3 0 - 0.9   POC MID % 7.9 0 - 12 %M   POC Granulocyte 1.3 (A) 2 - 6.9   Granulocyte percent 35.9 (A) 37 - 80 %G   RBC 4.63 4.04 - 5.48 M/uL   Hemoglobin 13.0 12.2 - 16.2 g/dL   HCT, POC 37.8 37.7 - 47.9 %   MCV 81.8 80 - 97 fL   MCH, POC 28.1 27 - 31.2 pg   MCHC 34.4 31.8 - 35.4 g/dL   RDW,  POC 12.5 %   Platelet Count, POC 226 142 - 424 K/uL   MPV 8.1 0 - 99.8 fL  POCT glucose (manual entry)     Status: None   Collection Time: 02/13/15  6:32 PM  Result Value Ref Range   POC Glucose 86 70 - 99 mg/dl  POCT urinalysis dipstick     Status: Abnormal   Collection Time: 02/13/15  6:32 PM  Result Value Ref Range   Color, UA yellow yellow   Clarity, UA cloudy (A) clear   Glucose, UA negative negative   Bilirubin, UA negative negative   Ketones, POC UA small (15) (A) negative   Spec Grav, UA 1.020    Blood, UA trace-lysed (A) negative   pH, UA 5.5    Protein Ur, POC =30 (A) negative   Urobilinogen, UA 1.0    Nitrite, UA Negative Negative   Leukocytes, UA Negative Negative    No results found.  ASSESSMENT AND PLAN  [name] was seen today for follow-up.  Diagnoses and all orders for this visit:  Hyponatremia: She is asymptomatic today.  Will check osmolalities and CMPGFR.  Advised that if her sodium continues to be a problem then it going to the hospital would be prudent for sodium replenishment and quick and effective work up.   -     COMPLETE METABOLIC PANEL WITH GFR -     POCT CBC -     Osmolality, urine -     Osmolality -     Cancel: TSH -     Cancel: ACTH    The patient was advised to call or return to clinic if she does not see an improvement in symptoms or to seek the care of the closest emergency department if she worsens with the above plan.   Philis Fendt, MHS, PA-C [facility] 02/15/2015 11:55 AM

## 2015-02-16 LAB — PATHOLOGIST SMEAR REVIEW

## 2015-02-16 NOTE — Telephone Encounter (Signed)
Na better

## 2015-03-02 ENCOUNTER — Other Ambulatory Visit: Payer: Self-pay | Admitting: Family Medicine

## 2015-03-02 ENCOUNTER — Encounter (HOSPITAL_COMMUNITY): Payer: Self-pay | Admitting: Emergency Medicine

## 2015-03-02 ENCOUNTER — Ambulatory Visit (INDEPENDENT_AMBULATORY_CARE_PROVIDER_SITE_OTHER): Payer: BC Managed Care – PPO | Admitting: Family Medicine

## 2015-03-02 VITALS — BP 104/70 | HR 80 | Temp 98.0°F | Resp 16 | Ht 61.0 in | Wt 116.8 lb

## 2015-03-02 DIAGNOSIS — R799 Abnormal finding of blood chemistry, unspecified: Secondary | ICD-10-CM | POA: Diagnosis present

## 2015-03-02 DIAGNOSIS — Z8719 Personal history of other diseases of the digestive system: Secondary | ICD-10-CM | POA: Diagnosis not present

## 2015-03-02 DIAGNOSIS — Z7982 Long term (current) use of aspirin: Secondary | ICD-10-CM | POA: Insufficient documentation

## 2015-03-02 DIAGNOSIS — Z862 Personal history of diseases of the blood and blood-forming organs and certain disorders involving the immune mechanism: Secondary | ICD-10-CM | POA: Insufficient documentation

## 2015-03-02 DIAGNOSIS — Z8739 Personal history of other diseases of the musculoskeletal system and connective tissue: Secondary | ICD-10-CM | POA: Diagnosis not present

## 2015-03-02 DIAGNOSIS — R110 Nausea: Secondary | ICD-10-CM

## 2015-03-02 DIAGNOSIS — Z8709 Personal history of other diseases of the respiratory system: Secondary | ICD-10-CM | POA: Insufficient documentation

## 2015-03-02 DIAGNOSIS — E871 Hypo-osmolality and hyponatremia: Secondary | ICD-10-CM | POA: Diagnosis not present

## 2015-03-02 DIAGNOSIS — E119 Type 2 diabetes mellitus without complications: Secondary | ICD-10-CM | POA: Insufficient documentation

## 2015-03-02 DIAGNOSIS — Z79899 Other long term (current) drug therapy: Secondary | ICD-10-CM | POA: Diagnosis not present

## 2015-03-02 LAB — COMPLETE METABOLIC PANEL WITH GFR
ALT: 21 U/L (ref 6–29)
AST: 22 U/L (ref 10–35)
Albumin: 4 g/dL (ref 3.6–5.1)
Alkaline Phosphatase: 65 U/L (ref 33–130)
BUN: 8 mg/dL (ref 7–25)
CHLORIDE: 96 mmol/L — AB (ref 98–110)
CO2: 24 mmol/L (ref 20–31)
CREATININE: 0.77 mg/dL (ref 0.50–0.99)
Calcium: 9 mg/dL (ref 8.6–10.4)
GFR, Est African American: >89 mL/min (ref >=60)
GFR, Est Non African American: 82 mL/min (ref >=60)
GLUCOSE: 86 mg/dL (ref 65–99)
Potassium: 4.3 mmol/L (ref 3.5–5.3)
SODIUM: 127 mmol/L — AB (ref 135–146)
Total Bilirubin: 0.3 mg/dL (ref 0.2–1.2)
Total Protein: 7.6 g/dL (ref 6.1–8.1)

## 2015-03-02 LAB — COMPREHENSIVE METABOLIC PANEL
ALBUMIN: 3.5 g/dL (ref 3.5–5.0)
ALK PHOS: 64 U/L (ref 38–126)
ALT: 22 U/L (ref 14–54)
ANION GAP: 9 (ref 5–15)
AST: 27 U/L (ref 15–41)
BILIRUBIN TOTAL: 0.3 mg/dL (ref 0.3–1.2)
BUN: 7 mg/dL (ref 6–20)
CALCIUM: 8.6 mg/dL — AB (ref 8.9–10.3)
CO2: 22 mmol/L (ref 22–32)
Chloride: 94 mmol/L — ABNORMAL LOW (ref 101–111)
Creatinine, Ser: 0.75 mg/dL (ref 0.44–1.00)
GFR calc non Af Amer: >60 mL/min (ref >60)
GLUCOSE: 90 mg/dL (ref 65–99)
Potassium: 4.2 mmol/L (ref 3.5–5.1)
Sodium: 125 mmol/L — ABNORMAL LOW (ref 135–145)
TOTAL PROTEIN: 7 g/dL (ref 6.5–8.1)

## 2015-03-02 LAB — CBC
HCT: 31.7 % — ABNORMAL LOW (ref 36.0–46.0)
HEMOGLOBIN: 11.1 g/dL — AB (ref 12.0–15.0)
MCH: 27.6 pg (ref 26.0–34.0)
MCHC: 35 g/dL (ref 30.0–36.0)
MCV: 78.9 fL (ref 78.0–100.0)
Platelets: 257 10*3/uL (ref 150–400)
RBC: 4.02 MIL/uL (ref 3.87–5.11)
RDW: 12.2 % (ref 11.5–15.5)
WBC: 5.7 10*3/uL (ref 4.0–10.5)

## 2015-03-02 MED ORDER — ONDANSETRON HCL 4 MG PO TABS: 4.0000 mg | ORAL_TABLET | Freq: Three times a day (TID) | ORAL | Status: DC | PRN

## 2015-03-02 NOTE — Progress Notes (Signed)
   HPI  Patient presents today here with persistent nausea with request for referral to GI.  Patient explains that she was seen earlier in February with nausea and vomiting and found to have a low sodium. She was seen again 2 days later and her sodium had corrected drastically.  Since that time she's had some intermittent and persistent nausea but no vomiting. She's been using Zofran off and on to control the nausea. She has not had another episode of vomiting since that initial episode.  She denies any constipation, diarrhea, melena, hematochezia.  She's had a colonoscopy previouslywhich was normal. She has no new medications or medications that are not listed in the computer. She is tolerating foods and fluids normally right now but does have some intermittent nausea.   PMH: Smoking status noted ROS: Per HPI  Objective: BP 104/70 mmHg  Pulse 80  Temp(Src) 98 F (36.7 C) (Oral)  Resp 16  Ht  (1.549 m)  Wt 116 lb 12.8 oz (52.98 kg)  BMI 22.08 kg/m2  SpO2 98% Gen: NAD, alert, cooperative with exam HEENT: NCAT, EOMI, PERRL CV: RRR, good S1/S2, no murmur Resp: CTABL, no wheezes, non-labored Abd: SNTND, BS present, no guarding or organomegaly Ext: No edema, warm Neuro: Alert and oriented  Assessment and plan:  # persistent nausea, hyponatremia Hyponatremia on the last check about 2 weeks ago had corrected drastically on its own. Repeating CMP and osmolality today. Her nausea is intermittent but has continued without explanation, she has an appointment with GI already, she asked for referral for insurance purposes. She's tolerating fluids, she appears well-hydrated Reasons to return or seek emergency medical care reviewed, return to clinic as needed    Orders Placed This Encounter  Procedures  . COMPLETE METABOLIC PANEL WITH GFR  . Osmolality  . Ambulatory referral to Gastroenterology    Referral Priority:  Routine    Referral Type:  Consultation    Referral  Reason:  Specialty Services Required    Number of Visits Requested:  1    Meds ordered this encounter  Medications  . ondansetron (ZOFRAN) 4 MG tablet    Sig: Take 1 tablet (4 mg total) by mouth every 8 (eight) hours as needed for nausea or vomiting.    Dispense:  20 tablet    Refill:  0    Murtis Sink, MD [facility] 03/02/2015, 11:45 AM

## 2015-03-02 NOTE — ED Notes (Signed)
Patient here with follow-up from hyponatremia complications. States onset of nausea and headache starting earlier this month. Went to [facility] and was found to have NA+ @ 113, IV was administered at that time. Recheck of NA+ on 2/22 was 127. Today patient continues to have headache and general malaise. Denies nausea at this time.

## 2015-03-02 NOTE — Patient Instructions (Signed)
Great to meet you!  Please come back with any concerns  We will call or send a letter with your results  A referral for GI has been placed, be sure to keep your appointment  Hyponatremia Hyponatremia is when the amount of salt (sodium) in your blood is too low. When sodium levels are low, your cells absorb extra water and they swell. The swelling happens throughout the body, but it mostly affects the brain. CAUSES This condition may be caused by:  Heart, kidney, or liver problems.  Thyroid problems.  Adrenal gland problems.  Metabolic conditions, such as syndrome of inappropriate antidiuretic hormone (SIADH).  Severe vomiting and diarrhea.  Certain medicines or illegal drugs.  Dehydration.  Drinking too much water.  Eating a diet that is low in sodium.  Large burns on your body.  Sweating. RISK FACTORS This condition is more likely to develop in people who:  Have long-term (chronic) kidney disease.  Have heart failure.  Have a medical condition that causes frequent or excessive diarrhea.  Have metabolic conditions, such as Addison disease or SIADH.  Take certain medicines that affect the sodium and fluid balance in the blood. Some of these medicine types include:  Diuretics.  NSAIDs.  Some opioid pain medicines.  Some antidepressants.  Some seizure prevention medicines. SYMPTOMS  Symptoms of this condition include:  Nausea and vomiting.  Confusion.  Lethargy.  Agitation.  Headache.  Seizures.  Unconsciousness.  Appetite loss.  Muscle weakness and cramping.  Feeling weak or light-headed.  Having a rapid heart rate.  Fainting, in severe cases. DIAGNOSIS This condition is diagnosed with a medical history and physical exam. You will also have other tests, including:  Blood tests.  Urine tests. TREATMENT Treatment for this condition depends on the cause. Treatment may include:  Fluids given through an IV tube that is inserted into  one of your veins.  Medicines to correct the sodium imbalance. If medicines are causing the condition, the medicines will need to be adjusted.  Limiting water or fluid intake to get the correct sodium balance. HOME CARE INSTRUCTIONS  Take medicines only as directed by your health care provider. Many medicines can make this condition worse. Talk with your health care provider about any medicines that you are currently taking.  Carefully follow a recommended diet as directed by your health care provider.  Carefully follow instructions from your health care provider about fluid restrictions.  Keep all follow-up visits as directed by your health care provider. This is important.  Do not drink alcohol. SEEK MEDICAL CARE IF:  You develop worsening nausea, fatigue, headache, confusion, or weakness.  Your symptoms go away and then return.  You have problems following the recommended diet. SEEK IMMEDIATE MEDICAL CARE IF:  You have a seizure.  You faint.  You have ongoing diarrhea or vomiting.   This information is not intended to replace advice given to you by your health care provider. Make sure you discuss any questions you have with your health care provider.   Document Released: 12/10/2001 Document Revised: 05/06/2014 Document Reviewed: 01/09/2014 Elsevier Interactive Patient Education [employer]

## 2015-03-03 ENCOUNTER — Other Ambulatory Visit: Payer: Self-pay | Admitting: Family Medicine

## 2015-03-03 ENCOUNTER — Emergency Department (HOSPITAL_COMMUNITY)
Admission: EM | Admit: 2015-03-03 | Discharge: 2015-03-03 | Disposition: A | Discharge status: Home / Self Care | Payer: BC Managed Care – PPO | Attending: Emergency Medicine | Admitting: Emergency Medicine

## 2015-03-03 DIAGNOSIS — R110 Nausea: Secondary | ICD-10-CM

## 2015-03-03 DIAGNOSIS — E871 Hypo-osmolality and hyponatremia: Secondary | ICD-10-CM

## 2015-03-03 LAB — URINALYSIS, ROUTINE W REFLEX MICROSCOPIC
BILIRUBIN URINE: NEGATIVE
GLUCOSE, UA: NEGATIVE mg/dL
Hgb urine dipstick: NEGATIVE
KETONES UR: NEGATIVE mg/dL
Leukocytes, UA: NEGATIVE
NITRITE: NEGATIVE
PH: 7 (ref 5.0–8.0)
Protein, ur: NEGATIVE mg/dL
Specific Gravity, Urine: <1.005 — ABNORMAL LOW (ref 1.005–1.030)

## 2015-03-03 LAB — SODIUM, URINE, RANDOM: Sodium, Ur: 63 mmol/L

## 2015-03-03 LAB — OSMOLALITY: OSMOLALITY: 266 mosm/kg — AB (ref 275–300)

## 2015-03-03 LAB — CREATININE, URINE, RANDOM: Creatinine, Urine: 14.91 mg/dL

## 2015-03-03 MED ORDER — SODIUM CHLORIDE 0.9 % IV BOLUS (SEPSIS)
1000.0000 mL | Freq: Once | INTRAVENOUS | Status: AC
  Administered 2015-03-03: 1000 mL via INTRAVENOUS

## 2015-03-03 MED ORDER — ONDANSETRON HCL 4 MG/2ML IJ SOLN
4.0000 mg | Freq: Once | INTRAMUSCULAR | Status: DC
  Filled 2015-03-03: qty 2

## 2015-03-03 NOTE — ED Provider Notes (Signed)
CSN: [id number]     Arrival date & time 03/02/15  2057 History   By signing my name below, I, Heidi [name], attest that this documentation has been prepared under the direction and in the presence of [name], MD.  Electronically Signed: Arlan [name], ED Scribe. 03/03/2015. 12:47 AM.   Chief Complaint  Patient presents with  . Abnormal Lab   The history is provided by the patient. No language interpreter was used.    HPI Comments: Heidi [name] is a 65 y.o. female with a PMHx of DM and anemia who presents to the Emergency Department here for abnormal labs this evening. Pt is here for follow up for hyponatremia complications as sodium readings were as low as 113 2 weeks ago. However, on 2/22 sodium levels improved to 127. Pt was evaluated by Urgent Care today with stable Na. She now reports ongoing nausea, dizziness, generalized weakness.  Also reports mild headache. 2 out of 10. OTC Tylenol attempted prior to arrival with improvement for HA. No recent fever, chills, abdominal pain, vomiting, diarrhea, chest pain, or shortness of breath.  Denies any vision changes, weakness, numbness, tingling.  PCP: Bufford Spikes, DO    Past Medical History  Diagnosis Date  . Allergy   . Lymphocytosis (symptomatic)   . Disorder of bone and cartilage, unspecified   . Eosinophilia   . Type II or unspecified type diabetes mellitus without mention of complication, uncontrolled   . Unspecified disorder of liver   . Pain in joint, shoulder region   . Reflux esophagitis   . Anemia, unspecified   . Other and unspecified hyperlipidemia   . Allergic rhinitis due to pollen   . Other abnormal blood chemistry   . Umbilical hernia without mention of obstruction or gangrene    Past Surgical History  Procedure Laterality Date  . Cesarean section     History reviewed. No pertinent family history. Social History  Substance Use Topics  . Smoking status: Never Smoker   . Smokeless tobacco: Never Used   . Alcohol Use: No   OB History    No data available     Review of Systems  Constitutional: Negative for fever and chills.  Respiratory: Negative for cough and shortness of breath.   Cardiovascular: Negative for chest pain.  Gastrointestinal: Positive for nausea. Negative for vomiting and abdominal pain.  Neurological: Positive for dizziness and weakness.  Psychiatric/Behavioral: Positive for confusion.  All other systems reviewed and are negative.     Allergies  Review of patient's allergies indicates no known allergies.  Home Medications   Prior to Admission medications   Medication Sig Start Date End Date Taking? Authorizing Provider  aspirin EC 81 MG tablet Take 81 mg by mouth daily.   Yes Historical Provider, MD  calcium-vitamin D (OSCAL WITH D) 500-200 MG-UNIT per tablet Take 1 tablet by mouth daily.   Yes Historical Provider, MD  diphenhydramine-acetaminophen (TYLENOL PM) 25-500 MG TABS tablet Take 1 tablet by mouth at bedtime as needed (for sleep).   Yes Historical Provider, MD  Multiple Vitamin (MULTIVITAMIN) tablet Take 1 tablet by mouth daily.   Yes Historical Provider, MD  ondansetron (ZOFRAN) 4 MG tablet Take 1 tablet (4 mg total) by mouth every 8 (eight) hours as needed for nausea or vomiting. 03/02/15  Yes Elenora Gamma, MD   Triage Vitals: BP 111/69 mmHg  Pulse 59  Temp(Src) 98.1 F (36.7 C) (Oral)  Resp 16  SpO2 100%   Physical Exam  Constitutional: She is oriented to person, place, and time. No distress.  HENT:  Head: Normocephalic and atraumatic.  Mouth/Throat: Oropharynx is clear and moist.  Eyes: EOM are normal. Pupils are equal, round, and reactive to light.  Cardiovascular: Normal rate, regular rhythm and normal heart sounds.   No murmur heard. Pulmonary/Chest: Effort normal and breath sounds normal. No respiratory distress. She has no wheezes.  Abdominal: Soft. Bowel sounds are normal. There is no tenderness. There is no rebound and no  guarding.  Musculoskeletal: She exhibits no edema.  Neurological: She is alert and oriented to person, place, and time.  Cranial nerves II through XII intact, 5 out of 5 strength in all 4 extremities, no dysmetria to finger-nose-finger  Skin: Skin is warm and dry.  Psychiatric: She has a normal mood and affect.  Nursing note and vitals reviewed.   ED Course  Procedures (including critical care time)  DIAGNOSTIC STUDIES: Oxygen Saturation is 99% on RA, Normal by my interpretation.    COORDINATION OF CARE: 12:33 AM- Will give fluids. Will order urinalysis and blood work. Discussed treatment plan with pt at bedside and pt agreed to plan.     Labs Review Labs Reviewed  COMPREHENSIVE METABOLIC PANEL - Abnormal; Notable for the following:    Sodium 125 (*)    Chloride 94 (*)    Calcium 8.6 (*)    All other components within normal limits  CBC - Abnormal; Notable for the following:    Hemoglobin 11.1 (*)    HCT 31.7 (*)    All other components within normal limits  URINALYSIS, ROUTINE W REFLEX MICROSCOPIC (NOT AT [facility]) - Abnormal; Notable for the following:    Color, Urine STRAW (*)    Specific Gravity, Urine <1.005 (*)    All other components within normal limits  SODIUM, URINE, RANDOM  CREATININE, URINE, RANDOM    Imaging Review No results found. I have personally reviewed and evaluated these images and lab results as part of my medical decision-making.   EKG Interpretation None      MDM   Final diagnoses:  Hyponatremia  Nausea    Patient presents with persistent hyponatremia. She reports generalized weakness, dizziness, and mild headache. Symptoms have been ongoing since early February. She was seen at that time at urgent care where her sodium was 113. At that time she had vomiting and abdominal pain. The vomiting and abdominal pain have improved but she has persistent nausea and generalized weakness. She is nontoxic on exam. She appears well-hydrated. She is  nonfocal. Sodium here is 125 and stable from prior repeat sodiums at urgent care. Her other lab workup is reassuring.  She is not orthostatic. She has no reproducible abdominal tenderness. No focal findings. She is on time pump inhibitor for possible reflux symptoms. She has GI follow-up at the end of March.  Patient was given fluids. She reports improvement and states she feels better. At this time there is no indication for emergent imaging. Given persistent nausea, agree with gastroenterology evaluation. If she has return of vomiting or abdominal pain, she needs to be reevaluated immediately. This as discussed with the patient and her family.  After history, exam, and medical workup I feel the patient has been appropriately medically screened and is safe for discharge home. Pertinent diagnoses were discussed with the patient. Patient was given return precautions.  I personally performed the services described in this documentation, which was scribed in my presence. The recorded information has been reviewed and is  accurate.   [name], MD 03/03/15 [phone]

## 2015-03-03 NOTE — Discharge Instructions (Signed)
Hyponatremia °Hyponatremia is when the amount of salt (sodium) in your blood is too low. When sodium levels are low, your cells absorb extra water and they swell. The swelling happens throughout the body, but it mostly affects the brain. °CAUSES °This condition may be caused by: °· Heart, kidney, or liver problems. °· Thyroid problems. °· Adrenal gland problems. °· Metabolic conditions, such as syndrome of inappropriate antidiuretic hormone (SIADH). °· Severe vomiting and diarrhea. °· Certain medicines or illegal drugs. °· Dehydration. °· Drinking too much water. °· Eating a diet that is low in sodium. °· Large burns on your body. °· Sweating. °RISK FACTORS °This condition is more likely to develop in people who: °· Have long-term (chronic) kidney disease. °· Have heart failure. °· Have a medical condition that causes frequent or excessive diarrhea. °· Have metabolic conditions, such as Addison disease or SIADH. °· Take certain medicines that affect the sodium and fluid balance in the blood. Some of these medicine types include: °¨ Diuretics. °¨ NSAIDs. °¨ Some opioid pain medicines. °¨ Some antidepressants. °¨ Some seizure prevention medicines. °SYMPTOMS  °Symptoms of this condition include: °· Nausea and vomiting. °· Confusion. °· Lethargy. °· Agitation. °· Headache. °· Seizures. °· Unconsciousness. °· Appetite loss. °· Muscle weakness and cramping. °· Feeling weak or light-headed. °· Having a rapid heart rate. °· Fainting, in severe cases. °DIAGNOSIS °This condition is diagnosed with a medical history and physical exam. You will also have other tests, including: °· Blood tests. °· Urine tests. °TREATMENT °Treatment for this condition depends on the cause. Treatment may include: °· Fluids given through an IV tube that is inserted into one of your veins. °· Medicines to correct the sodium imbalance. If medicines are causing the condition, the medicines will need to be adjusted. °· Limiting water or fluid intake to  get the correct sodium balance. °HOME CARE INSTRUCTIONS °· Take medicines only as directed by your health care provider. Many medicines can make this condition worse. Talk with your health care provider about any medicines that you are currently taking. °· Carefully follow a recommended diet as directed by your health care provider. °· Carefully follow instructions from your health care provider about fluid restrictions. °· Keep all follow-up visits as directed by your health care provider. This is important. °· Do not drink alcohol. °SEEK MEDICAL CARE IF: °· You develop worsening nausea, fatigue, headache, confusion, or weakness. °· Your symptoms go away and then return. °· You have problems following the recommended diet. °SEEK IMMEDIATE MEDICAL CARE IF: °· You have a seizure. °· You faint. °· You have ongoing diarrhea or vomiting. °  °This information is not intended to replace advice given to you by your health care provider. Make sure you discuss any questions you have with your health care provider. °  °Document Released: 12/10/2001 Document Revised: 05/06/2014 Document Reviewed: 01/09/2014 °Elsevier Interactive Patient Education ©2016 [employer] ° ° ° °

## 2015-03-04 ENCOUNTER — Other Ambulatory Visit: Payer: Self-pay | Admitting: Family Medicine

## 2015-03-04 ENCOUNTER — Ambulatory Visit (INDEPENDENT_AMBULATORY_CARE_PROVIDER_SITE_OTHER): Payer: BC Managed Care – PPO | Admitting: Family Medicine

## 2015-03-04 ENCOUNTER — Encounter: Payer: Self-pay | Admitting: Family Medicine

## 2015-03-04 VITALS — BP 108/68 | HR 63 | Ht 61.5 in | Wt 115.5 lb

## 2015-03-04 DIAGNOSIS — E119 Type 2 diabetes mellitus without complications: Secondary | ICD-10-CM | POA: Diagnosis not present

## 2015-03-04 DIAGNOSIS — E871 Hypo-osmolality and hyponatremia: Secondary | ICD-10-CM

## 2015-03-04 LAB — BASIC METABOLIC PANEL
BUN: 7 mg/dL (ref 6–23)
CALCIUM: 9 mg/dL (ref 8.4–10.5)
CO2: 27 meq/L (ref 19–32)
CREATININE: 0.88 mg/dL (ref 0.40–1.20)
Chloride: 95 mEq/L — ABNORMAL LOW (ref 96–112)
GFR: 83.22 mL/min (ref >60.00)
Glucose, Bld: 67 mg/dL — ABNORMAL LOW (ref 70–99)
Potassium: 3.3 mEq/L — ABNORMAL LOW (ref 3.5–5.1)
Sodium: 127 mEq/L — ABNORMAL LOW (ref 135–145)

## 2015-03-04 LAB — CORTISOL: Cortisol, Plasma: 4.5 ug/dL

## 2015-03-04 LAB — TSH: TSH: 1.87 m[IU]/L

## 2015-03-04 LAB — T4, FREE: FREE T4: 1 ng/dL (ref 0.8–1.8)

## 2015-03-04 LAB — HEMOGLOBIN A1C: HEMOGLOBIN A1C: 5.9 % (ref 4.6–6.5)

## 2015-03-04 NOTE — Patient Instructions (Signed)
I will give you a call when I get your labs in today I suspect that you have SIADH- this is a relatively common cause of low sodium Reduce your water intake to a liter a day- some of this can be gatorade.    We will plan to have you see nephrology asap to help [place] manage this issue If you have any worsening please let me know!

## 2015-03-04 NOTE — Progress Notes (Addendum)
Healthcare at [facility] [address] [place], [place] [address] [phone]  Date:  03/04/2015   Name:  Heidi [name]   DOB:  10/17/1950   MRN:  [id number]  PCP:  Bufford Spikes, DO    Chief Complaint: Follow-up   History of Present Illness:  Heidi [name] is a 65 y.o. very pleasant female patient who presents with the following:  Pt with history of DM2, anemia. She got sick with GI symptoms in early Feb, was seen at [facility] and was noted o have significant hyponatremia down to 113.  Improved to 127, then back to 125 yesterday in the ER.  She went to the ER yesterday because she had a new sx- headache- and her daughter got concerned.  She was treated with IVF and feels better now  She thinks she had low Na about 10 years ago due to C diff diarrhea.  Otherwise no history of hyponatremia that they can recall  She is not on any diuretic or other medication that should cause hyponatremia.   She has not vomited in several weeks She does not have any diarrhea  Her daughter has noted that she seems less steady on her feet, she has not been thinking quite as well as usual; however there have been no falls or evidence of severe cognitive dysfunction  She has already had a TSH, urine sodium and urine olmolality.  So far her labs suggest SIADH Also her daughter mentions a CT head that her mom had in 31:  1. Ventricular dilatation is again noted, slightly disproportionate to the degree of diffuse cortical atrophy. Is there any clinical suspicion of normal pressure hydrocephalus? 2. Atrophy and moderate small vessel ischemic change.  Following this CT she was seen by neurology.  They discussed an MRI but did not pursue this as her sx had resolved  Lab Results  Component Value Date   HGBA1C 5.8* 09/10/2014    Patient Active Problem List   Diagnosis Date Noted  . Hyponatremia 03/03/2015  . Special screening for malignant neoplasms,  colon 12/10/2012  . Lymphocytosis (symptomatic)   . Disorder of bone and cartilage, unspecified   . Type II or unspecified type diabetes mellitus without mention of complication, uncontrolled   . Reflux esophagitis   . Anemia, unspecified   . Other and unspecified hyperlipidemia   . Lymphocytosis 01/16/2012  . Idiopathic eosinophilia 04/05/2011  . NONSPECIFIC ABNORMAL RESULTS LIVR FUNCTION STUDY 08/15/2008  . ANEMIA, HX OF 08/15/2008    Past Medical History  Diagnosis Date  . Allergy   . Lymphocytosis (symptomatic)   . Disorder of bone and cartilage, unspecified   . Eosinophilia   . Type II or unspecified type diabetes mellitus without mention of complication, uncontrolled   . Unspecified disorder of liver   . Pain in joint, shoulder region   . Reflux esophagitis   . Anemia, unspecified   . Other and unspecified hyperlipidemia   . Allergic rhinitis due to pollen   . Other abnormal blood chemistry   . Umbilical hernia without mention of obstruction or gangrene     Past Surgical History  Procedure Laterality Date  . Cesarean section      Social History  Substance Use Topics  . Smoking status: Never Smoker   . Smokeless tobacco: Never Used  . Alcohol Use: No    No family history on file.  No Known Allergies  Medication list has been reviewed and  updated.  Current Outpatient Prescriptions on File Prior to Visit  Medication Sig Dispense Refill  . aspirin EC 81 MG tablet Take 81 mg by mouth daily.    . calcium-vitamin D (OSCAL WITH D) 500-200 MG-UNIT per tablet Take 1 tablet by mouth daily.    . diphenhydramine-acetaminophen (TYLENOL PM) 25-500 MG TABS tablet Take 1 tablet by mouth at bedtime as needed (for sleep).    . Multiple Vitamin (MULTIVITAMIN) tablet Take 1 tablet by mouth daily.    . ondansetron (ZOFRAN) 4 MG tablet Take 1 tablet (4 mg total) by mouth every 8 (eight) hours as needed for nausea or vomiting. 20 tablet 0   No current facility-administered  medications on file prior to visit.    Review of Systems:  As per HPI- otherwise negative.   Physical Examination: Filed Vitals:   03/04/15 0857  BP: 108/68  Pulse: 63   Filed Vitals:   03/04/15 0857  Height: 5' 1.5" (1.562 m)  Weight: 115 lb 8 oz (52.39 kg)   Body mass index is 21.47 kg/(m^2). Ideal Body Weight: Weight in (lb) to have BMI = 25: 134.2  GEN: WDWN, NAD, Non-toxic, A & O x 3, slim build, looks well HEENT: Atraumatic, Normocephalic. Neck supple. No masses, No LAD.  Bilateral TM wnl, oropharynx normal.  PEERL,EOMI.   Ears and Nose: No external deformity. CV: RRR, No M/G/R. No JVD. No thrill. No extra heart sounds. PULM: CTA B, no wheezes, crackles, rhonchi. No retractions. No resp. distress. No accessory muscle use. ABD: S, NT, ND, +BS. No rebound. No HSM. EXTR: No c/c/e NEURO Normal gait. Normal movement of all extremities.   PSYCH: Normally interactive. Conversant. Not depressed or anxious appearing.  Calm demeanor.  No sign of edema in her face, belly or LE  Assessment and Plan: Hyponatremia - Plan: Basic metabolic panel, Cortisol, Ambulatory referral to Nephrology, Basic metabolic panel  Controlled type 2 diabetes mellitus without complication, without long-term current use of insulin (HCC) - Plan: Hemoglobin A1C   Here today to recheck hyponatremia.  Discussed in detail with pt and her daughter.  Hyponatremia can have many causes, one of the most common being SIADH.  This is our hypothesis for now- I will order a couple of other labs today and we will refer to nephrology.  Advised her to limit free water to a liter or less and to intake salt as desired.    Signed Abbe Amsterdam, MD  Received labs from today-  Results for orders placed or performed in visit on 03/04/15  Basic metabolic panel  Result Value Ref Range   Sodium 127 (L) 135 - 145 mEq/L   Potassium 3.3 (L) 3.5 - 5.1 mEq/L   Chloride 95 (L) 96 - 112 mEq/L   CO2 27 19 - 32 mEq/L   Glucose,  Bld 67 (L) 70 - 99 mg/dL   BUN 7 6 - 23 mg/dL   Creatinine, Ser 4.54 0.40 - 1.20 mg/dL   Calcium 9.0 8.4 - 09.8 mg/dL   GFR 11.91 >47.82 mL/min  Cortisol  Result Value Ref Range   Cortisol, Plasma 4.5 ug/dL  Hemoglobin N5A  Result Value Ref Range   Hgb A1c MFr Bld 5.9 4.6 - 6.5 %   Sodium is a bit better.  Her glucose is certainly not high.   Called pt to discuss.   She will come in for labs only in 48 hours and we will refer to nephrology asap

## 2015-03-06 ENCOUNTER — Other Ambulatory Visit (INDEPENDENT_AMBULATORY_CARE_PROVIDER_SITE_OTHER): Payer: BC Managed Care – PPO

## 2015-03-06 DIAGNOSIS — E871 Hypo-osmolality and hyponatremia: Secondary | ICD-10-CM

## 2015-03-06 LAB — BASIC METABOLIC PANEL
BUN: 7 mg/dL (ref 6–23)
CALCIUM: 9 mg/dL (ref 8.4–10.5)
CO2: 29 mEq/L (ref 19–32)
Chloride: 96 mEq/L (ref 96–112)
Creatinine, Ser: 0.86 mg/dL (ref 0.40–1.20)
GFR: 85.46 mL/min (ref >60.00)
Glucose, Bld: 62 mg/dL — ABNORMAL LOW (ref 70–99)
Potassium: 3.9 mEq/L (ref 3.5–5.1)
SODIUM: 129 meq/L — AB (ref 135–145)

## 2015-03-08 LAB — T4, FREE: Free T4: 1 ng/dL (ref 0.8–1.8)

## 2015-03-08 LAB — TSH: TSH: 1.93 mIU/L

## 2015-03-09 ENCOUNTER — Encounter: Payer: Self-pay | Admitting: Family Medicine

## 2015-03-09 ENCOUNTER — Ambulatory Visit: Payer: BC Managed Care – PPO | Admitting: Internal Medicine

## 2015-03-11 ENCOUNTER — Encounter: Payer: Self-pay | Admitting: Family Medicine

## 2015-03-11 ENCOUNTER — Other Ambulatory Visit: Payer: Self-pay | Admitting: Family Medicine

## 2015-03-11 DIAGNOSIS — E871 Hypo-osmolality and hyponatremia: Secondary | ICD-10-CM

## 2015-03-11 NOTE — Progress Notes (Signed)
Called to check on her- she has a nephrology appt in 2 weeks.  I don't want her to go that long without us checking her sodium- she will come by for a BMP lab visit only in the next week

## 2015-03-12 ENCOUNTER — Other Ambulatory Visit (INDEPENDENT_AMBULATORY_CARE_PROVIDER_SITE_OTHER): Payer: BC Managed Care – PPO

## 2015-03-12 DIAGNOSIS — E871 Hypo-osmolality and hyponatremia: Secondary | ICD-10-CM

## 2015-03-12 LAB — BASIC METABOLIC PANEL
BUN: 9 mg/dL (ref 6–23)
CHLORIDE: 106 meq/L (ref 96–112)
CO2: 28 mEq/L (ref 19–32)
Calcium: 8.8 mg/dL (ref 8.4–10.5)
Creatinine, Ser: 0.9 mg/dL (ref 0.40–1.20)
GFR: 81.09 mL/min (ref >60.00)
GLUCOSE: 73 mg/dL (ref 70–99)
POTASSIUM: 3.5 meq/L (ref 3.5–5.1)
SODIUM: 140 meq/L (ref 135–145)

## 2015-03-31 ENCOUNTER — Ambulatory Visit: Payer: BC Managed Care – PPO | Admitting: Gastroenterology

## 2015-04-10 ENCOUNTER — Telehealth: Payer: Self-pay | Admitting: Internal Medicine

## 2015-04-10 NOTE — Telephone Encounter (Signed)
Patient is due for QUALITY MEASURES, our records show patients [facility] been [facility].. Did patient leave practice?? I called and left a message for patient to call back

## 2015-04-20 ENCOUNTER — Ambulatory Visit (INDEPENDENT_AMBULATORY_CARE_PROVIDER_SITE_OTHER): Payer: BC Managed Care – PPO | Admitting: Family Medicine

## 2015-04-20 ENCOUNTER — Encounter: Payer: Self-pay | Admitting: Family Medicine

## 2015-04-20 VITALS — BP 127/72 | HR 64 | Temp 97.8°F | Ht 61.0 in | Wt 120.4 lb

## 2015-04-20 DIAGNOSIS — R112 Nausea with vomiting, unspecified: Secondary | ICD-10-CM | POA: Diagnosis not present

## 2015-04-20 DIAGNOSIS — E871 Hypo-osmolality and hyponatremia: Secondary | ICD-10-CM | POA: Diagnosis not present

## 2015-04-20 LAB — COMPREHENSIVE METABOLIC PANEL
ALBUMIN: 4 g/dL (ref 3.5–5.2)
ALT: 29 U/L (ref 0–35)
AST: 26 U/L (ref 0–37)
Alkaline Phosphatase: 69 U/L (ref 39–117)
BUN: 13 mg/dL (ref 6–23)
CHLORIDE: 106 meq/L (ref 96–112)
CO2: 29 mEq/L (ref 19–32)
CREATININE: 0.96 mg/dL (ref 0.40–1.20)
Calcium: 9.4 mg/dL (ref 8.4–10.5)
GFR: 75.24 mL/min (ref >60.00)
GLUCOSE: 70 mg/dL (ref 70–99)
Potassium: 4.7 mEq/L (ref 3.5–5.1)
SODIUM: 141 meq/L (ref 135–145)
TOTAL PROTEIN: 7.9 g/dL (ref 6.0–8.3)
Total Bilirubin: 0.3 mg/dL (ref 0.2–1.2)

## 2015-04-20 NOTE — Progress Notes (Signed)
Pre visit review using our clinic review tool, if applicable. No additional management support is needed unless otherwise documented below in the visit note. 

## 2015-04-20 NOTE — Progress Notes (Signed)
[facility] [address], [address] [place], [place] [address] [phone] [phone]  Date:  04/20/2015   Name:  Heidi [name]   DOB:  04/29/1950   MRN:  [id number]  PCP:  Bufford SpikesEED, TIFFANY, DO    Chief Complaint: Vomiting   History of Present Illness:  Heidi [name] is a 65 y.o. very pleasant female patient who presents with the following:  Last seen by myself about 6 weeks ago. History of hyponatremiaa, diet controlledDM2. She had some vomiting in February and ended up getting treated with fluids at the ER to resolve her hyponatremia.   I referred her to nephrology in March - I do have a note from them but unfortunately we only have 2 pages scanned so I do not have the AandP.  However the pt reports that she was told that she was ok and that she just needed to just drink less water. Admits that she was drinking a lot of water every day as she thought it was good for her health.   Today is Monday- she vomited once on Saturday- brought up everything she ate that day. Did not have any belly pain. She does not have any current nausea or belly pain.  Vomited just once  She is not aware of any suspicious foods, no diarrhea.  Normal stools currently  She has done well since she got sick in February and has not been bothered by chronic stomach trouble.   Recent A1c looked fine at 5.9  At this time she is feeling back to normal again. However she and her husband were concerned and decided to make sure her sodium was ok  Patient Active Problem List   Diagnosis Date Noted  . Hyponatremia 03/03/2015  . Special screening for malignant neoplasms, colon 12/10/2012  . Lymphocytosis (symptomatic)   . Disorder of bone and cartilage, unspecified   . Type II or unspecified type diabetes mellitus without mention of complication, uncontrolled   . Reflux esophagitis   . Anemia, unspecified   . Other and unspecified hyperlipidemia   . Lymphocytosis 01/16/2012   . Idiopathic eosinophilia 04/05/2011  . NONSPECIFIC ABNORMAL RESULTS LIVR FUNCTION STUDY 08/15/2008  . ANEMIA, HX OF 08/15/2008    Past Medical History  Diagnosis Date  . Allergy   . Lymphocytosis (symptomatic)   . Disorder of bone and cartilage, unspecified   . Eosinophilia   . Type II or unspecified type diabetes mellitus without mention of complication, uncontrolled   . Unspecified disorder of liver   . Pain in joint, shoulder region   . Reflux esophagitis   . Anemia, unspecified   . Other and unspecified hyperlipidemia   . Allergic rhinitis due to pollen   . Other abnormal blood chemistry   . Umbilical hernia without mention of obstruction or gangrene     Past Surgical History  Procedure Laterality Date  . Cesarean section      Social History  Substance Use Topics  . Smoking status: Never Smoker   . Smokeless tobacco: Never Used  . Alcohol Use: No    No family history on file.  No Known Allergies  Medication list has been reviewed and updated.  Current Outpatient Prescriptions on File Prior to Visit  Medication Sig Dispense Refill  . aspirin EC 81 MG tablet Take 81 mg by mouth daily.    . calcium-vitamin D (OSCAL WITH D) 500-200 MG-UNIT per tablet Take 1 tablet by mouth daily.    .Marland Kitchen  Multiple Vitamin (MULTIVITAMIN) tablet Take 1 tablet by mouth daily.    . ondansetron (ZOFRAN) 4 MG tablet Take 1 tablet (4 mg total) by mouth every 8 (eight) hours as needed for nausea or vomiting. 20 tablet 0  . diphenhydramine-acetaminophen (TYLENOL PM) 25-500 MG TABS tablet Take 1 tablet by mouth at bedtime as needed (for sleep). Reported on 04/20/2015     No current facility-administered medications on file prior to visit.    Review of Systems:  As per HPI- otherwise negative.   Physical Examination: Filed Vitals:   04/20/15 1140  BP: 127/72  Pulse: 64  Temp: 97.8 F (36.6 C)    GEN: WDWN, NAD, Non-toxic, A & O x 3, looks well, here today with her husband HEENT:  Atraumatic, Normocephalic. Neck supple. No masses, No LAD. Ears and Nose: No external deformity. CV: RRR, No M/G/R. No JVD. No thrill. No extra heart sounds. PULM: CTA B, no wheezes, crackles, rhonchi. No retractions. No resp. distress. No accessory muscle use. ABD: S, NT, ND, +BS. No rebound. No HSM.  Benign belly exam EXTR: No c/c/e NEURO Normal gait.  PSYCH: Normally interactive. Conversant. Not depressed or anxious appearing.  Calm demeanor.    Assessment and Plan: Hyponatremia syndrome - Plan: Comprehensive metabolic panel  Nausea and vomiting, vomiting of unspecified type - Plan: Comprehensive metabolic panel  History of hyponatremia thought due to excessive water intake, got sick in the past after vomiting.  Will check her levels today but suspect all will be well.  Asked her to keep me posted and let me know if she has any further vomiting or any pain. Will be in touch with her labs   Signed Abbe Amsterdam, MD

## 2015-04-20 NOTE — Patient Instructions (Signed)
Your recent A1c showed that your diabetes is under good control I will check your sodium level today and make sure all is well As long as you do not continue to vomit I would not worry, but let me know if the vomiting return or if you have belly pain

## 2015-04-28 ENCOUNTER — Other Ambulatory Visit: Payer: Self-pay | Admitting: Obstetrics and Gynecology

## 2015-05-12 ENCOUNTER — Encounter (HOSPITAL_COMMUNITY): Payer: Self-pay

## 2015-05-12 ENCOUNTER — Ambulatory Visit (HOSPITAL_COMMUNITY)
Admission: RE | Admit: 2015-05-12 | Discharge: 2015-05-12 | Disposition: A | Discharge status: Home / Self Care | Payer: BC Managed Care – PPO | Source: Ambulatory Visit | Attending: Obstetrics and Gynecology | Admitting: Obstetrics and Gynecology

## 2015-05-12 DIAGNOSIS — M810 Age-related osteoporosis without current pathological fracture: Secondary | ICD-10-CM | POA: Insufficient documentation

## 2015-05-12 MED ORDER — ZOLEDRONIC ACID 5 MG/100ML IV SOLN
5.0000 mg | Freq: Once | INTRAVENOUS | Status: AC
  Administered 2015-05-12: 5 mg via INTRAVENOUS
  Filled 2015-05-12: qty 100

## 2015-05-12 MED ORDER — SODIUM CHLORIDE 0.9 % IV SOLN
Freq: Once | INTRAVENOUS | Status: AC
  Administered 2015-05-12: 14:00:00 via INTRAVENOUS

## 2015-05-12 NOTE — Discharge Instructions (Signed)

## 2015-06-08 ENCOUNTER — Ambulatory Visit (INDEPENDENT_AMBULATORY_CARE_PROVIDER_SITE_OTHER): Payer: BC Managed Care – PPO | Admitting: Family Medicine

## 2015-06-08 ENCOUNTER — Encounter: Payer: Self-pay | Admitting: Family Medicine

## 2015-06-08 VITALS — BP 105/70 | HR 62 | Temp 98.2°F | Ht 61.0 in | Wt 123.6 lb

## 2015-06-08 DIAGNOSIS — E871 Hypo-osmolality and hyponatremia: Secondary | ICD-10-CM

## 2015-06-08 DIAGNOSIS — R110 Nausea: Secondary | ICD-10-CM

## 2015-06-08 MED ORDER — SUCRALFATE 1 G PO TABS: 1.0000 g | ORAL_TABLET | Freq: Three times a day (TID) | ORAL | Status: DC

## 2015-06-08 NOTE — Patient Instructions (Signed)
We will check for any cause of stomach ulcer and will also check your sodium level today Continue to use zofran as needed for nausea  We will also try adding carafate for stomach acid- take it with meals and at bedtime for 10 days

## 2015-06-08 NOTE — Progress Notes (Signed)
[facility] [address] [place], [place] [address] [phone]  Date:  06/08/2015   Name:  Heidi [name]   DOB:  1950-04-26   MRN:  [id number]  PCP:  Abbe Amsterdam, MD    Chief Complaint: Hyponatremia   History of Present Illness:  Heidi [name] is a 65 y.o. very pleasant female patient who presents with the following:  History of hyponatremia and diet controlled DM.   She had hyponatremia requiring fluids which was treated in the ER in February of this year.   Last seen 6 weeks ago because she had vomited and was concerned about her sodium level- it was ok at that time She has felt lightheaded when she gets up and walks, she felt nauseated but did not vomit.  She did use some zofran over the last few days She has noted sx for about 2 weeks days now.  No headache, no diarrhea She is eating ok No abd pain She is mostly worried about checking her sodium, but also wonders about frequent nausea.  She wonders if this might be related to stomach acid She notes normal BM. No blood or other stool changes  No new medicines  She did have a colonoscopy 2 years ago- normal  Never had endoscopy She started on pepcid 6 months ago- she is not sure if this is really helping her or not  Lab Results  Component Value Date   HGBA1C 5.9 03/04/2015   BP Readings from Last 3 Encounters:  06/08/15 95/61  05/12/15 112/72  04/20/15 127/72   Wt Readings from Last 3 Encounters:  06/08/15 123 lb 9.6 oz (56.065 kg)  05/12/15 123 lb 6.4 oz (55.974 kg)  04/20/15 120 lb 6.4 oz (54.613 kg)     Patient Active Problem List   Diagnosis Date Noted  . Hyponatremia 03/03/2015  . Special screening for malignant neoplasms, colon 12/10/2012  . Lymphocytosis (symptomatic)   . Disorder of bone and cartilage, unspecified   . Type II or unspecified type diabetes mellitus without mention of complication, uncontrolled   . Reflux esophagitis    . Anemia, unspecified   . Other and unspecified hyperlipidemia   . Lymphocytosis 01/16/2012  . Idiopathic eosinophilia 04/05/2011  . NONSPECIFIC ABNORMAL RESULTS LIVR FUNCTION STUDY 08/15/2008  . ANEMIA, HX OF 08/15/2008    Past Medical History  Diagnosis Date  . Allergy   . Lymphocytosis (symptomatic)   . Disorder of bone and cartilage, unspecified   . Eosinophilia   . Type II or unspecified type diabetes mellitus without mention of complication, uncontrolled   . Unspecified disorder of liver   . Pain in joint, shoulder region   . Reflux esophagitis   . Anemia, unspecified   . Other and unspecified hyperlipidemia   . Allergic rhinitis due to pollen   . Other abnormal blood chemistry   . Umbilical hernia without mention of obstruction or gangrene     Past Surgical History  Procedure Laterality Date  . Cesarean section      Social History  Substance Use Topics  . Smoking status: Never Smoker   . Smokeless tobacco: Never Used  . Alcohol Use: No    No family history on file.  No Known Allergies  Medication list has been reviewed and updated.  Current Outpatient Prescriptions on File Prior to Visit  Medication Sig Dispense Refill  . aspirin EC 81 MG tablet Take 81 mg by mouth daily.    Marland Kitchen  calcium-vitamin D (OSCAL WITH D) 500-200 MG-UNIT per tablet Take 1 tablet by mouth daily.    . cetirizine (ZYRTEC) 10 MG tablet Take 10 mg by mouth daily.    . famotidine (PEPCID) 10 MG tablet Take 10 mg by mouth at bedtime.    . Multiple Vitamin (MULTIVITAMIN) tablet Take 1 tablet by mouth daily.    . ondansetron (ZOFRAN) 4 MG tablet Take 1 tablet (4 mg total) by mouth every 8 (eight) hours as needed for nausea or vomiting. 20 tablet 0  . XIIDRA 5 % SOLN Place into both eyes 2 (two) times daily.     No current facility-administered medications on file prior to visit.    Review of Systems:  As per HPI- otherwise negative.   Physical Examination: Filed Vitals:   06/08/15  1436  BP: 95/61  Pulse: 62  Temp: 98.2 F (36.8 C)   Filed Vitals:   06/08/15 1436  Height: 5\' 1"  (1.549 m)  Weight: 123 lb 9.6 oz (56.065 kg)   Body mass index is 23.37 kg/(m^2). Ideal Body Weight: Weight in (lb) to have BMI = 25: 132  GEN: WDWN, NAD, Non-toxic, A & O x 3, slim build, looks well HEENT: Atraumatic, Normocephalic. Neck supple. No masses, No LAD.  Bilateral TM wnl, oropharynx normal.  PEERL,EOMI.   Ears and Nose: No external deformity. CV: RRR, No M/G/R. No JVD. No thrill. No extra heart sounds. PULM: CTA B, no wheezes, crackles, rhonchi. No retractions. No resp. distress. No accessory muscle use. ABD: S, NT, ND, +BS. No rebound. No HSM.  Benign belly EXTR: No c/c/e NEURO Normal gait.  PSYCH: Normally interactive. Conversant. Not depressed or anxious appearing.  Calm demeanor.   Orthostatic VS for the past 24 hrs:  BP- Lying Pulse- Lying BP- Sitting Pulse- Sitting BP- Standing at 0 minutes Pulse- Standing at 0 minutes  06/08/15 1506 102/72 mmHg 71 114/80 mmHg 70 112/82 mmHg 61      Assessment and Plan: Nausea without vomiting - Plan: H. pylori breath test, CBC, sucralfate (CARAFATE) 1 g tablet  Hyponatremia - Plan: Comprehensive metabolic panel  Check sodium level today as she is concerned about recurrent hyponatremia She is on a PPI so there is a chance of false negative but will check H pylori as she is here in the office today Add carafate to her regiment for 10 days  Will plan further follow- up pending labs. Meds ordered this encounter  Medications  . prednisoLONE acetate (PRED FORTE) 1 % ophthalmic suspension    Sig: Place 1 drop into the right eye daily.   . sucralfate (CARAFATE) 1 g tablet    Sig: Take 1 tablet (1 g total) by mouth 4 (four) times daily -  with meals and at bedtime.    Dispense:  40 tablet    Refill:  0     Signed Abbe AmsterdamJessica Copland, MD

## 2015-06-08 NOTE — Progress Notes (Signed)
Pre visit review using our clinic review tool, if applicable. No additional management support is needed unless otherwise documented below in the visit note. 

## 2015-06-09 LAB — CBC
HCT: 35.4 % — ABNORMAL LOW (ref 36.0–46.0)
Hemoglobin: 11.6 g/dL — ABNORMAL LOW (ref 12.0–15.0)
MCHC: 32.8 g/dL (ref 30.0–36.0)
MCV: 84.9 fl (ref 78.0–100.0)
PLATELETS: 206 10*3/uL (ref 150.0–400.0)
RBC: 4.17 Mil/uL (ref 3.87–5.11)
RDW: 12.9 % (ref 11.5–15.5)
WBC: 7.2 10*3/uL (ref 4.0–10.5)

## 2015-06-09 LAB — COMPREHENSIVE METABOLIC PANEL
ALT: 37 U/L — ABNORMAL HIGH (ref 0–35)
AST: 37 U/L (ref 0–37)
Albumin: 3.8 g/dL (ref 3.5–5.2)
Alkaline Phosphatase: 75 U/L (ref 39–117)
BUN: 14 mg/dL (ref 6–23)
CALCIUM: 9.1 mg/dL (ref 8.4–10.5)
CHLORIDE: 96 meq/L (ref 96–112)
CO2: 29 meq/L (ref 19–32)
CREATININE: 0.9 mg/dL (ref 0.40–1.20)
GFR: 81.02 mL/min (ref >60.00)
Glucose, Bld: 78 mg/dL (ref 70–99)
Potassium: 4.3 mEq/L (ref 3.5–5.1)
Sodium: 129 mEq/L — ABNORMAL LOW (ref 135–145)
Total Bilirubin: 0.3 mg/dL (ref 0.2–1.2)
Total Protein: 7.7 g/dL (ref 6.0–8.3)

## 2015-06-09 LAB — H. PYLORI BREATH TEST: H. pylori Breath Test: NOT DETECTED

## 2015-06-10 NOTE — Addendum Note (Signed)
Addended by: Abbe AmsterdamOPLAND, Talon Witting C on: 06/10/2015 10:14 PM   Modules accepted: Orders

## 2015-06-11 ENCOUNTER — Telehealth: Payer: Self-pay | Admitting: Emergency Medicine

## 2015-06-11 NOTE — Telephone Encounter (Signed)
Tried to contact pt to schedule lab appt for 06/12/15 for repeat BMP.Left message for pt to call back to schedule appt.

## 2015-06-12 ENCOUNTER — Encounter: Payer: Self-pay | Admitting: Family Medicine

## 2015-06-12 ENCOUNTER — Other Ambulatory Visit (INDEPENDENT_AMBULATORY_CARE_PROVIDER_SITE_OTHER): Payer: BC Managed Care – PPO

## 2015-06-12 ENCOUNTER — Telehealth: Payer: Self-pay | Admitting: Emergency Medicine

## 2015-06-12 DIAGNOSIS — E871 Hypo-osmolality and hyponatremia: Secondary | ICD-10-CM | POA: Diagnosis not present

## 2015-06-12 LAB — BASIC METABOLIC PANEL
BUN: 9 mg/dL (ref 6–23)
CALCIUM: 8.4 mg/dL (ref 8.4–10.5)
CO2: 30 meq/L (ref 19–32)
Chloride: 103 mEq/L (ref 96–112)
Creatinine, Ser: 0.85 mg/dL (ref 0.40–1.20)
GFR: 86.55 mL/min (ref >60.00)
Glucose, Bld: 67 mg/dL — ABNORMAL LOW (ref 70–99)
Potassium: 3.8 mEq/L (ref 3.5–5.1)
SODIUM: 136 meq/L (ref 135–145)

## 2015-06-12 NOTE — Telephone Encounter (Signed)
2nd attempt to reach pt to come in today, Friday 06/12/15 for repeat BMP. Left message for pt to call back to schedule lab appointment for today if possible.

## 2015-07-13 ENCOUNTER — Ambulatory Visit (INDEPENDENT_AMBULATORY_CARE_PROVIDER_SITE_OTHER): Payer: BC Managed Care – PPO | Admitting: Family Medicine

## 2015-07-13 ENCOUNTER — Encounter: Payer: Self-pay | Admitting: Family Medicine

## 2015-07-13 VITALS — BP 144/67 | HR 68 | Temp 97.9°F | Ht 61.0 in | Wt 123.4 lb

## 2015-07-13 DIAGNOSIS — R110 Nausea: Secondary | ICD-10-CM | POA: Diagnosis not present

## 2015-07-13 DIAGNOSIS — E871 Hypo-osmolality and hyponatremia: Secondary | ICD-10-CM

## 2015-07-13 LAB — BASIC METABOLIC PANEL
BUN: 10 mg/dL (ref 6–23)
CHLORIDE: 105 meq/L (ref 96–112)
CO2: 29 mEq/L (ref 19–32)
Calcium: 9.4 mg/dL (ref 8.4–10.5)
Creatinine, Ser: 0.9 mg/dL (ref 0.40–1.20)
GFR: 81 mL/min (ref >60.00)
Glucose, Bld: 72 mg/dL (ref 70–99)
POTASSIUM: 3.8 meq/L (ref 3.5–5.1)
SODIUM: 137 meq/L (ref 135–145)

## 2015-07-13 MED ORDER — SUCRALFATE 1 G PO TABS: 1.0000 g | ORAL_TABLET | Freq: Three times a day (TID) | ORAL | Status: DC

## 2015-07-13 NOTE — Patient Instructions (Signed)
You may use the carafate if needed for nausea We will check your sodium today and I'll be in touch with you asap. I placed a standing order to check your BMP every month if you like.  Assuming your sodium is normal I think you can taper off the gatorade and drink water again, but drink only when you feel thirsty. If you wish to continue some gatorade I think that is also ok

## 2015-07-13 NOTE — Progress Notes (Signed)
[facility] [address], [address] [place], [place] [address] [phone] [phone]  Date:  07/13/2015   Name:  Heidi Cruzminata Kearley   DOB:  10/04/1950   MRN:  [id number]  PCP:  Abbe AmsterdamOPLAND,JESSICA, MD    Chief Complaint: Follow-up   History of Present Illness:  Heidi [name] is a 65 y.o. very pleasant female patient who presents with the following:  Here today to recheck her hyponatremia.  In February she was treated in the ER for hyponatremia; thought to be due to GI illness, this was the first time she ever had low sodium that we know of. .  She was seen here again about one month ago with concern about possible hyponatremia, and also frequent nausea.  We had planned to have her see GI about this symptom- she has an appt scheduled for next month.  She had a negative h pylori and did 10 days of carafate- this seems to have really helped her stomach sx   Her lightheadedness is much better since her has been drinking gatorade but her daughter wonders if she needs to be drinking too much and can she stop using this. Her daughter is an OBG in [place] and is here with her today She is drinking gatorade still- she will drink 40 oz a day  She is retiring from [employer] this month and is pleased about the free time she will have although she will miss her job  BP Readings from Last 3 Encounters:  07/13/15 144/67  06/08/15 105/70  05/12/15 112/72   Wt Readings from Last 3 Encounters:  07/13/15 123 lb 6.4 oz (55.974 kg)  06/08/15 123 lb 9.6 oz (56.065 kg)  05/12/15 123 lb 6.4 oz (55.974 kg)    Appointment on 06/12/2015  Component Date Value Ref Range Status  . Sodium 06/12/2015 136  135 - 145 mEq/L Final  . Potassium 06/12/2015 3.8  3.5 - 5.1 mEq/L Final  . Chloride 06/12/2015 103  96 - 112 mEq/L Final  . CO2 06/12/2015 30  19 - 32 mEq/L Final  . Glucose, Bld 06/12/2015 67* 70 - 99 mg/dL Final  . BUN 29/52/841306/09/2015 9  6 - 23 mg/dL Final  .  Creatinine, Ser 06/12/2015 0.85  0.40 - 1.20 mg/dL Final  . Calcium 24/40/102706/09/2015 8.4  8.4 - 10.5 mg/dL Final  . GFR 25/36/644006/09/2015 86.55  >60.00 mL/min Final     Patient Active Problem List   Diagnosis Date Noted  . Hyponatremia 03/03/2015  . Special screening for malignant neoplasms, colon 12/10/2012  . Lymphocytosis (symptomatic)   . Disorder of bone and cartilage, unspecified   . Type II or unspecified type diabetes mellitus without mention of complication, uncontrolled   . Reflux esophagitis   . Anemia, unspecified   . Other and unspecified hyperlipidemia   . Lymphocytosis 01/16/2012  . Idiopathic eosinophilia 04/05/2011  . NONSPECIFIC ABNORMAL RESULTS LIVR FUNCTION STUDY 08/15/2008  . ANEMIA, HX OF 08/15/2008    Past Medical History  Diagnosis Date  . Allergy   . Lymphocytosis (symptomatic)   . Disorder of bone and cartilage, unspecified   . Eosinophilia   . Type II or unspecified type diabetes mellitus without mention of complication, uncontrolled   . Unspecified disorder of liver   . Pain in joint, shoulder region   . Reflux esophagitis   . Anemia, unspecified   . Other and unspecified hyperlipidemia   . Allergic rhinitis due to pollen   .  Other abnormal blood chemistry   . Umbilical hernia without mention of obstruction or gangrene     Past Surgical History  Procedure Laterality Date  . Cesarean section      Social History  Substance Use Topics  . Smoking status: Never Smoker   . Smokeless tobacco: Never Used  . Alcohol Use: No    No family history on file.  No Known Allergies  Medication list has been reviewed and updated.  Current Outpatient Prescriptions on File Prior to Visit  Medication Sig Dispense Refill  . aspirin EC 81 MG tablet Take 81 mg by mouth daily.    . calcium-vitamin D (OSCAL WITH D) 500-200 MG-UNIT per tablet Take 1 tablet by mouth daily.    . cetirizine (ZYRTEC) 10 MG tablet Take 10 mg by mouth daily.    . Multiple Vitamin  (MULTIVITAMIN) tablet Take 1 tablet by mouth daily.    . ondansetron (ZOFRAN) 4 MG tablet Take 1 tablet (4 mg total) by mouth every 8 (eight) hours as needed for nausea or vomiting. 20 tablet 0  . XIIDRA 5 % SOLN Place into both eyes 2 (two) times daily.     No current facility-administered medications on file prior to visit.    Review of systems As per HPI- otherwise negative.    Physical Examination: Filed Vitals:   07/13/15 1016  BP: 144/67  Pulse: 68  Temp: 97.9 F (36.6 C)   Filed Vitals:   07/13/15 1016  Height:  (1.549 m)  Weight: 123 lb 6.4 oz (55.974 kg)   Body mass index is 23.33 kg/(m^2). Ideal Body Weight: Weight in (lb) to have BMI = 25: 132  GEN: WDWN, NAD, Non-toxic, A & O x 3, looks well, normal weight HEENT: Atraumatic, Normocephalic. Neck supple. No masses, No LAD. Ears and Nose: No external deformity. CV: RRR, No M/G/R. No JVD. No thrill. No extra heart sounds. PULM: CTA B, no wheezes, crackles, rhonchi. No retractions. No resp. distress. No accessory muscle use. ABD: S, NT, ND. No rebound. No HSM.  Benign belly EXTR: No c/c/e NEURO Normal gait.  PSYCH: Normally interactive. Conversant. Not depressed or anxious appearing.  Calm demeanor.    Assessment and Plan: Hyponatremia - Plan: Basic metabolic panel  Nausea without vomiting - Plan: sucralfate (CARAFATE) 1 g tablet  Concern about hyponatremia today. Will check her level for her, and also placed a standing order for recheck once a month if she likes.  Assuming Na is normal she will taper off the gatorade but not drink excessive water- drink only if thirsty Refilled her carafate to use if needed while we await her GI appt   If she continues to have sodium concerns we wonder if she actually has SIADH  Signed Abbe Amsterdam, MD

## 2015-07-13 NOTE — Progress Notes (Signed)
Pre visit review using our clinic review tool, if applicable. No additional management support is needed unless otherwise documented below in the visit note. 

## 2015-08-13 ENCOUNTER — Other Ambulatory Visit (INDEPENDENT_AMBULATORY_CARE_PROVIDER_SITE_OTHER): Payer: BC Managed Care – PPO

## 2015-08-13 DIAGNOSIS — E871 Hypo-osmolality and hyponatremia: Secondary | ICD-10-CM

## 2015-08-13 LAB — BASIC METABOLIC PANEL
BUN: 14 mg/dL (ref 6–23)
CHLORIDE: 105 meq/L (ref 96–112)
CO2: 31 meq/L (ref 19–32)
CREATININE: 0.97 mg/dL (ref 0.40–1.20)
Calcium: 9.6 mg/dL (ref 8.4–10.5)
GFR: 74.27 mL/min (ref >60.00)
Glucose, Bld: 74 mg/dL (ref 70–99)
POTASSIUM: 4.5 meq/L (ref 3.5–5.1)
Sodium: 139 mEq/L (ref 135–145)

## 2015-08-20 ENCOUNTER — Ambulatory Visit (INDEPENDENT_AMBULATORY_CARE_PROVIDER_SITE_OTHER): Payer: BC Managed Care – PPO | Admitting: Gastroenterology

## 2015-08-20 ENCOUNTER — Other Ambulatory Visit (INDEPENDENT_AMBULATORY_CARE_PROVIDER_SITE_OTHER): Payer: BC Managed Care – PPO

## 2015-08-20 ENCOUNTER — Encounter: Payer: Self-pay | Admitting: Gastroenterology

## 2015-08-20 VITALS — BP 110/60 | HR 72 | Ht 61.0 in | Wt 122.6 lb

## 2015-08-20 DIAGNOSIS — E871 Hypo-osmolality and hyponatremia: Secondary | ICD-10-CM

## 2015-08-20 DIAGNOSIS — R112 Nausea with vomiting, unspecified: Secondary | ICD-10-CM

## 2015-08-20 LAB — SODIUM: Sodium: 140 mEq/L (ref 135–145)

## 2015-08-20 NOTE — Patient Instructions (Addendum)
You have been scheduled for an endoscopy. Please follow written instructions given to you at your visit today. If you use inhalers (even only as needed), please bring them with you on the day of your procedure. Your physician has requested that you go to www.startemmi.com and enter the access code given to you at your visit today. This web site gives a general overview about your procedure. However, you should still follow specific instructions given to you by our office regarding your preparation for the procedure.   Go to the basement for labs today   Follow up as needed

## 2015-08-20 NOTE — Progress Notes (Signed)
Heidi [name]    [id number]    06/17/1950  Primary Care Physician:COPLAND,JESSICA, MD  Referring Physician: Elenora GammaSamuel L Bradshaw, MD [address] [place], [place] [id number]  Chief complaint: Nausea and vomiting  HPI: 65 year old woman previously followed by Dr. Arlyce DiceKaplan is here for evaluation of intermittent nausea and vomiting for the past few months. Patient reports her symptoms started early this year and she was in the ER in February where she was noted to have severe hyponatremia. She thinks her symptoms of nausea and vomiting are worse whenever her sodium level goes down. She did have low serum osmolality with normal urine osmolality. She has no other specific symptoms. Denies any dysphagia, odynophagia, change in bowel habits, constipation, diarrhea, abdominal pain, melena or bright red blood per rectum. She has been able to maintain normal sodium level past few weeks and denies any vomiting though she continues to have intermittent nausea controlled with when necessary Zofran. She is also taking Carafate as needed. Last colonoscopy in February 2015 was normal.    Outpatient Encounter Prescriptions as of 08/20/2015  Medication Sig  . calcium-vitamin D (OSCAL WITH D) 500-200 MG-UNIT per tablet Take 1 tablet by mouth daily.  . cetirizine (ZYRTEC) 10 MG tablet Take 10 mg by mouth daily.  . COD LIVER OIL PO Take 1 capsule by mouth daily.  . Multiple Vitamin (MULTIVITAMIN) tablet Take 1 tablet by mouth daily.  . ondansetron (ZOFRAN) 4 MG tablet Take 1 tablet (4 mg total) by mouth every 8 (eight) hours as needed for nausea or vomiting.  . sucralfate (CARAFATE) 1 g tablet Take 1 tablet (1 g total) by mouth 4 (four) times daily -  with meals and at bedtime. Use as needed  . XIIDRA 5 % SOLN Place into both eyes 2 (two) times daily.  . [DISCONTINUED] aspirin EC 81 MG tablet Take 81 mg by mouth daily.   No facility-administered encounter medications on file as of 08/20/2015.      Allergies as of 08/20/2015  . (No Known Allergies)    Past Medical History:  Diagnosis Date  . Allergic rhinitis due to pollen   . Allergy   . Anemia, unspecified   . Disorder of bone and cartilage, unspecified   . Eosinophilia   . Lymphocytosis (symptomatic)   . Other abnormal blood chemistry   . Other and unspecified hyperlipidemia   . Pain in joint, shoulder region   . Reflux esophagitis   . Type II or unspecified type diabetes mellitus without mention of complication, uncontrolled   . Umbilical hernia without mention of obstruction or gangrene   . Unspecified disorder of liver     Past Surgical History:  Procedure Laterality Date  . CESAREAN SECTION      Family History  Problem Relation Age of Onset  . Pancreatic cancer Neg Hx   . Colon cancer Neg Hx   . Esophageal cancer Neg Hx     Social History   Social History  . Marital status: Married    Spouse name: Eleanora NeighborBampia  . Number of children: 4  . Years of education: BSN   Occupational History  . RN [employer]   Social History Main Topics  . Smoking status: Never Smoker  . Smokeless tobacco: Never Used  . Alcohol use No  . Drug use: No  . Sexual activity: Yes    Partners: Male   Other Topics Concern  . Not on file  Social History Narrative   Married   Never smoked   Alcohol none   Exercise walk 2-3 times week               Review of systems: Review of Systems  Constitutional: Negative for fever and chills.  HENT: Negative.   Eyes: Negative for blurred vision.  Respiratory: Negative for cough, shortness of breath and wheezing.   Cardiovascular: Negative for chest pain and palpitations.  Gastrointestinal: as per HPI Genitourinary: Negative for dysuria, urgency, frequency and hematuria.  Musculoskeletal: Negative for myalgias, back pain and joint pain.  Skin: Negative for itching and rash.  Neurological: Negative for dizziness, tremors, focal weakness, seizures and loss of  consciousness.  Endo/Heme/Allergies: Positive for environmental allergies.  Psychiatric/Behavioral: Negative for depression, suicidal ideas and hallucinations.  All other systems reviewed and are negative.   Physical Exam: Vitals:   08/20/15 0918  BP: 110/60  Pulse: 72   Gen:      No acute distress HEENT:  EOMI, sclera anicteric Neck:     No masses; no thyromegaly Lungs:    Clear to auscultation bilaterally; normal respiratory effort CV:         Regular rate and rhythm; no murmurs Abd:      + bowel sounds; soft, non-tender; no palpable masses, no distension Ext:    No edema; adequate peripheral perfusion Skin:      Warm and dry; no rash Neuro: alert and oriented x 3 Psych: normal mood and affect  Data Reviewed:  Reviewed chart in epic   Assessment and Plan/Recommendations:  65 year old female here for evaluation of intermittent nausea and vomiting for the past 6 months. She was noted to have severe hyponatremia intermittently in the past few months associated with low serum osmolality. She has normal plasma cortisol level, urine osmolality, TSH, A1c and H. pylori negative Workup so far is unrevealing for etiology of hyponatremia, could be related to GI loss and dehydration in the setting of persistent nausea and vomiting Schedule for EGD for evaluation of persistent nausea and vomiting We'll recheck serum sodium, urine sodium, serum and urine osmolality Encourage patient to maintain hydration, use Zofran as needed to prevent nausea Continue H2 blocker and Carafate as needed  25 minutes was spent face-to-face with the patient. Greater than 50% of the time used for counseling as well as treatment plan and follow-up. She had multiple questions which were answered to her satisfaction    K. Scherry RanVeena Mickie Kozikowski , MD [phone] Mon-Fri 8a-5p [phone] after 5p, weekends, holidays  CC: Elenora GammaBradshaw, Samuel L, MD

## 2015-08-21 ENCOUNTER — Ambulatory Visit (AMBULATORY_SURGERY_CENTER): Payer: BC Managed Care – PPO | Admitting: Gastroenterology

## 2015-08-21 ENCOUNTER — Encounter: Payer: Self-pay | Admitting: Gastroenterology

## 2015-08-21 VITALS — BP 106/64 | HR 59 | Temp 97.7°F | Resp 22 | Ht 61.0 in | Wt 122.0 lb

## 2015-08-21 DIAGNOSIS — R112 Nausea with vomiting, unspecified: Secondary | ICD-10-CM

## 2015-08-21 DIAGNOSIS — K319 Disease of stomach and duodenum, unspecified: Secondary | ICD-10-CM | POA: Diagnosis not present

## 2015-08-21 LAB — OSMOLALITY, URINE: Osmolality, Ur: 652 mOsm/kg (ref 50–1200)

## 2015-08-21 LAB — OSMOLALITY: OSMOLALITY: 296 mosm/kg (ref 278–305)

## 2015-08-21 LAB — SODIUM, URINE, RANDOM: Sodium, Ur: 106 mmol/L (ref 28–272)

## 2015-08-21 MED ORDER — SODIUM CHLORIDE 0.9 % IV SOLN: 500.0000 mL | INTRAVENOUS | Status: DC

## 2015-08-21 NOTE — Progress Notes (Signed)
Procedure ends, awaiting space availability in recovery. Transferred, subsequently, to to recovery, report to Home DepotBrown, RN, VSS.

## 2015-08-21 NOTE — Op Note (Signed)
[facility] Patient Name: Heidi [name] Procedure Date: 08/21/2015 10:52 AM MRN: [id number] Endoscopist: Napoleon Form , MD Age: 65 Referring MD:  Date of Birth: 1950/06/18 Gender: Female Account #: [id number] Procedure:                Upper GI endoscopy Indications:              Persistent vomiting of unknown cause Medicines:                Monitored Anesthesia Care Procedure:                Pre-Anesthesia Assessment:                           - Prior to the procedure, a History and Physical                            was performed, and patient medications and                            allergies were reviewed. The patient's tolerance of                            previous anesthesia was also reviewed. The risks                            and benefits of the procedure and the sedation                            options and risks were discussed with the patient.                            All questions were answered, and informed consent                            was obtained. Prior Anticoagulants: The patient has                            taken no previous anticoagulant or antiplatelet                            agents. ASA Grade Assessment: II - A patient with                            mild systemic disease. After reviewing the risks                            and benefits, the patient was deemed in                            satisfactory condition to undergo the procedure.                           After obtaining informed consent, the endoscope was  passed under direct vision. Throughout the                            procedure, the patient's blood pressure, pulse, and                            oxygen saturations were monitored continuously. The                            Model GIF-[id number]) scope was introduced                            through the mouth, and advanced to the second part                            of  duodenum. The upper GI endoscopy was                            accomplished without difficulty. The patient                            tolerated the procedure well. Scope In: Scope Out: Findings:                 The esophagus was normal.                           The Z-line was regular and was found 32 cm from the                            incisors.                           Patchy mildly erythematous mucosa without bleeding                            was found in the prepyloric region of the stomach.                            Biopsies were taken with a cold forceps for                            histology.                           The examined duodenum was normal. Complications:            No immediate complications. Estimated Blood Loss:     Estimated blood loss was minimal. Impression:               - Normal esophagus.                           - Z-line regular, 32 cm from the incisors.                           - Erythematous mucosa  in the prepyloric region of                            the stomach. Biopsied.                           - Normal examined duodenum. Recommendation:           - Resume previous diet.                           - Continue present medications.                           - Await pathology results.                           - No ibuprofen, naproxen, or other non-steroidal                            anti-inflammatory drugs. Napoleon FormKavitha V. Odeal Welden, MD 08/21/2015 11:03:15 AM This report has been signed electronically.

## 2015-08-21 NOTE — Patient Instructions (Signed)
YOU HAD AN ENDOSCOPIC PROCEDURE TODAY AT THE [facility]:   Refer to the procedure report that was given to you for any specific questions about what was found during the examination.  If the procedure report does not answer your questions, please call your gastroenterologist to clarify.  If you requested that your care partner not be given the details of your procedure findings, then the procedure report has been included in a sealed envelope for you to review at your convenience later.  YOU SHOULD EXPECT: Some feelings of bloating in the abdomen. Passage of more gas than usual.  Walking can help get rid of the air that was put into your GI tract during the procedure and reduce the bloating. If you had a lower endoscopy (such as a colonoscopy or flexible sigmoidoscopy) you may notice spotting of blood in your stool or on the toilet paper. If you underwent a bowel prep for your procedure, you may not have a normal bowel movement for a few days.  Please Note:  You might notice some irritation and congestion in your nose or some drainage.  This is from the oxygen used during your procedure.  There is no need for concern and it should clear up in a day or so.  SYMPTOMS TO REPORT IMMEDIATELY:   Following upper endoscopy (EGD)  Vomiting of blood or coffee ground material  New chest pain or pain under the shoulder blades  Painful or persistently difficult swallowing  New shortness of breath  Fever of 100F or higher  Black, tarry-looking stools  For urgent or emergent issues, a gastroenterologist can be reached at any hour by calling [phone].   DIET:  We do recommend a small meal at first, but then you may proceed to your regular diet.  Drink plenty of fluids but you should avoid alcoholic beverages for 24 hours.  ACTIVITY:  You should plan to take it easy for the rest of today and you should NOT DRIVE or use heavy machinery until tomorrow (because of the sedation medicines used  during the test).    FOLLOW UP: Our staff will call the number listed on your records the next business day following your procedure to check on you and address any questions or concerns that you may have regarding the information given to you following your procedure. If we do not reach you, we will leave a message.  However, if you are feeling well and you are not experiencing any problems, there is no need to return our call.  We will assume that you have returned to your regular daily activities without incident.  If any biopsies were taken you will be contacted by phone or by letter within the next 1-3 weeks.  Please call us at [phone] if you have not heard about the biopsies in 3 weeks.    SIGNATURES/CONFIDENTIALITY: You and/or your care partner have signed paperwork which will be entered into your electronic medical record.  These signatures attest to the fact that that the information above on your After Visit Summary has been reviewed and is understood.  Full responsibility of the confidentiality of this discharge information lies with you and/or your care-partner. 

## 2015-08-21 NOTE — Progress Notes (Signed)
Called to room to assist during endoscopic procedure.  Patient ID and intended procedure confirmed with present staff. Received instructions for my participation in the procedure from the performing physician.  

## 2015-08-24 ENCOUNTER — Telehealth: Payer: Self-pay | Admitting: *Deleted

## 2015-08-24 NOTE — Telephone Encounter (Signed)
  Follow up Call-  Call back number 08/21/2015 02/06/2013  Post procedure Call Back phone  # [phone]  Permission to leave phone message Yes Yes  Some recent data might be hidden     Patient questions:  Do you have a fever, pain , or abdominal swelling? No. Pain Score  0 *  Have you tolerated food without any problems? Yes.    Have you been able to return to your normal activities? Yes.    Do you have any questions about your discharge instructions: Diet   No. Medications  No. Follow up visit  No.  Do you have questions or concerns about your Care? No.  Actions: * If pain score is 4 or above: No action needed, pain <4.  Spoke to husband

## 2015-08-29 ENCOUNTER — Encounter: Payer: Self-pay | Admitting: Gastroenterology

## 2015-09-14 ENCOUNTER — Other Ambulatory Visit (INDEPENDENT_AMBULATORY_CARE_PROVIDER_SITE_OTHER): Payer: BC Managed Care – PPO

## 2015-09-14 ENCOUNTER — Other Ambulatory Visit: Payer: Self-pay | Admitting: Family Medicine

## 2015-09-14 DIAGNOSIS — E871 Hypo-osmolality and hyponatremia: Secondary | ICD-10-CM

## 2015-09-14 LAB — BASIC METABOLIC PANEL
BUN: 11 mg/dL (ref 6–23)
CALCIUM: 9.1 mg/dL (ref 8.4–10.5)
CHLORIDE: 104 meq/L (ref 96–112)
CO2: 29 meq/L (ref 19–32)
CREATININE: 0.86 mg/dL (ref 0.40–1.20)
GFR: 85.32 mL/min (ref >60.00)
Glucose, Bld: 82 mg/dL (ref 70–99)
Potassium: 4 mEq/L (ref 3.5–5.1)
SODIUM: 137 meq/L (ref 135–145)

## 2015-10-08 ENCOUNTER — Encounter: Payer: Self-pay | Admitting: Family Medicine

## 2015-11-08 ENCOUNTER — Encounter: Payer: Self-pay | Admitting: Family Medicine

## 2015-12-01 ENCOUNTER — Ambulatory Visit (INDEPENDENT_AMBULATORY_CARE_PROVIDER_SITE_OTHER): Payer: BC Managed Care – PPO | Admitting: Physician Assistant

## 2015-12-01 ENCOUNTER — Encounter: Payer: Self-pay | Admitting: Physician Assistant

## 2015-12-01 VITALS — BP 118/64 | HR 64 | Temp 97.9°F | Resp 14 | Ht 61.0 in | Wt 124.0 lb

## 2015-12-01 DIAGNOSIS — J020 Streptococcal pharyngitis: Secondary | ICD-10-CM

## 2015-12-01 LAB — POCT RAPID STREP A (OFFICE): Rapid Strep A Screen: POSITIVE — AB

## 2015-12-01 MED ORDER — AMOXICILLIN 500 MG PO CAPS: 500.0000 mg | ORAL_CAPSULE | Freq: Two times a day (BID) | ORAL | 0 refills | Status: DC

## 2015-12-01 NOTE — Progress Notes (Signed)
Pre visit review using our clinic review tool, if applicable. No additional management support is needed unless otherwise documented below in the visit note. 

## 2015-12-01 NOTE — Patient Instructions (Signed)
Please start the Amoxicillin and take as directed with food. Stay well hydrated and get plenty of rest.  Tylenol if needed for fever or throat pain. Place a humidifier in the bedroom.  After being on about 4-5 days of antibiotic, change out your toothbrush for a new one.  You are technically contagious for 24 hours after starting the medication. Limit exposure to the elderly, infants and those who are very sick.   Follow-up if symptoms are not resolving.   Strep Throat Strep throat is a bacterial infection of the throat. Your health care provider may call the infection tonsillitis or pharyngitis, depending on whether there is swelling in the tonsils or at the back of the throat. Strep throat is most common during the cold months of the year in children who are 445-615 years of age, but it can happen during any season in people of any age. This infection is spread from person to person (contagious) through coughing, sneezing, or close contact. What are the causes? Strep throat is caused by the bacteria called Streptococcus pyogenes. What increases the risk? This condition is more likely to develop in:  People who spend time in crowded places where the infection can spread easily.  People who have close contact with someone who has strep throat. What are the signs or symptoms? Symptoms of this condition include:  Fever or chills.  Redness, swelling, or pain in the tonsils or throat.  Pain or difficulty when swallowing.  White or yellow spots on the tonsils or throat.  Swollen, tender glands in the neck or under the jaw.  Red rash all over the body (rare). How is this diagnosed? This condition is diagnosed by performing a rapid strep test or by taking a swab of your throat (throat culture test). Results from a rapid strep test are usually ready in a few minutes, but throat culture test results are available after one or two days. How is this treated? This condition is treated with  antibiotic medicine. Follow these instructions at home: Medicines  Take over-the-counter and prescription medicines only as told by your health care provider.  Take your antibiotic as told by your health care provider. Do not stop taking the antibiotic even if you start to feel better.  Have family members who also have a sore throat or fever tested for strep throat. They may need antibiotics if they have the strep infection. Eating and drinking  Do not share food, drinking cups, or personal items that could cause the infection to spread to other people.  If swallowing is difficult, try eating soft foods until your sore throat feels better.  Drink enough fluid to keep your urine clear or pale yellow. General instructions  Gargle with a salt-water mixture 3-4 times per day or as needed. To make a salt-water mixture, completely dissolve -1 tsp of salt in 1 cup of warm water.  Make sure that all household members wash their hands well.  Get plenty of rest.  Stay home from school or work until you have been taking antibiotics for 24 hours.  Keep all follow-up visits as told by your health care provider. This is important. Contact a health care provider if:  The glands in your neck continue to get bigger.  You develop a rash, cough, or earache.  You cough up a thick liquid that is green, yellow-brown, or bloody.  You have pain or discomfort that does not get better with medicine.  Your problems seem to be getting worse  rather than better.  You have a fever. Get help right away if:  You have new symptoms, such as vomiting, severe headache, stiff or painful neck, chest pain, or shortness of breath.  You have severe throat pain, drooling, or changes in your voice.  You have swelling of the neck, or the skin on the neck becomes red and tender.  You have signs of dehydration, such as fatigue, dry mouth, and decreased urination.  You become increasingly sleepy, or you cannot  wake up completely.  Your joints become red or painful. This information is not intended to replace advice given to you by your health care provider. Make sure you discuss any questions you have with your health care provider. Document Released: 12/18/1999 Document Revised: 08/19/2015 Document Reviewed: 04/14/2014 Elsevier Interactive Patient Education  2017 [employer]

## 2015-12-01 NOTE — Progress Notes (Signed)
Patient presents to clinic today c/o 2 days of bilateral sore throat with swollen and tender glands of neck. Denies fever, chills, cough or chest congestion. Notes sinus pressure with some PND. Denies ear pressure or pain. Denies recent travel or sick contact but is around children often. Has no taken anything for symptoms. Notes symptoms much improved today compared to yesterday but she wanted assessment as she is [place] to [place] going [place].   Past Medical History:  Diagnosis Date  . Allergic rhinitis due to pollen   . Allergy   . Anemia, unspecified   . Blood transfusion without reported diagnosis    1979  . Disorder of bone and cartilage, unspecified   . Eosinophilia   . Lymphocytosis (symptomatic)   . Osteoporosis   . Other abnormal blood chemistry   . Other and unspecified hyperlipidemia   . Pain in joint, shoulder region   . Reflux esophagitis   . Type II or unspecified type diabetes mellitus without mention of complication, uncontrolled   . Umbilical hernia without mention of obstruction or gangrene   . Unspecified disorder of liver     Current Outpatient Prescriptions on File Prior to Visit  Medication Sig Dispense Refill  . calcium-vitamin D (OSCAL WITH D) 500-200 MG-UNIT per tablet Take 1 tablet by mouth daily.    . cetirizine (ZYRTEC) 10 MG tablet Take 10 mg by mouth daily.    . COD LIVER OIL PO Take 1 capsule by mouth daily.    . Multiple Vitamin (MULTIVITAMIN) tablet Take 1 tablet by mouth daily.    . ondansetron (ZOFRAN) 4 MG tablet Take 1 tablet (4 mg total) by mouth every 8 (eight) hours as needed for nausea or vomiting. 20 tablet 0  . sucralfate (CARAFATE) 1 g tablet Take 1 tablet (1 g total) by mouth 4 (four) times daily -  with meals and at bedtime. Use as needed 40 tablet 1  . XIIDRA 5 % SOLN Place into both eyes 2 (two) times daily.     Current Facility-Administered Medications on File Prior to Visit  Medication Dose Route Frequency  Provider Last Rate Last Dose  . 0.9 %  sodium chloride infusion  500 mL Intravenous Continuous Heidi FormKavitha V Nandigam, MD        No Known Allergies  Family History  Problem Relation Age of Onset  . Pancreatic cancer Neg Hx   . Colon cancer Neg Hx   . Esophageal cancer Neg Hx   . Stomach cancer Neg Hx     Social History   Social History  . Marital status: Married    Spouse name: Heidi [name]  . Number of children: 4  . Years of education: BSN   Occupational History  . RN [employer]   Social History Main Topics  . Smoking status: Never Smoker  . Smokeless tobacco: Never Used  . Alcohol use No  . Drug use: No  . Sexual activity: Yes    Partners: Male   Other Topics Concern  . None   Social History Narrative   Married   Never smoked   Alcohol none   Exercise walk 2-3 times week            Review of Systems - See HPI.  All other ROS are negative.  BP 118/64   Pulse 64   Temp 97.9 F (36.6 C) (Oral)   Resp 14   Ht 5\' 1"  (1.549 m)  Wt 124 lb (56.2 kg)   SpO2 98%   BMI 23.43 kg/m   Physical Exam  Constitutional: She is oriented to person, place, and time and well-developed, well-nourished, and in no distress.  HENT:  Head: Normocephalic and atraumatic.  Right Ear: Tympanic membrane and external ear normal.  Left Ear: Tympanic membrane and external ear normal.  Nose: Nose normal.  Mouth/Throat: Uvula is midline. Oropharyngeal exudate and posterior oropharyngeal erythema present. No posterior oropharyngeal edema.  Eyes: Conjunctivae are normal.  Neck: Neck supple.  Cardiovascular: Normal rate, regular rhythm, normal heart sounds and intact distal pulses.   Pulmonary/Chest: Effort normal and breath sounds normal. No respiratory distress. She has no wheezes. She has no rales. She exhibits no tenderness.  Lymphadenopathy:    She has cervical adenopathy.  Neurological: She is alert and oriented to person, place, and time.  Skin: Skin is warm and dry. No  rash noted.  Psychiatric: Affect normal.  Vitals reviewed.   Recent Results (from the past 2160 hour(s))  Basic metabolic panel     Status: None   Collection Time: 09/14/15  9:52 AM  Result Value Ref Range   Sodium 137 135 - 145 mEq/L   Potassium 4.0 3.5 - 5.1 mEq/L   Chloride 104 96 - 112 mEq/L   CO2 29 19 - 32 mEq/L   Glucose, Bld 82 70 - 99 mg/dL   BUN 11 6 - 23 mg/dL   Creatinine, Ser 0.980.86 0.40 - 1.20 mg/dL   Calcium 9.1 8.4 - 11.910.5 mg/dL   GFR 14.7885.32 >29.56>60.00 mL/min    Assessment/Plan: 1. Streptococcal sore throat Rapid strep +. Rx Amoxicillin 500 mg BID x 10 days. Supportive measures and OTC medications reviewed with patient. FU discussed. - POCT rapid strep A - amoxicillin (AMOXIL) 500 MG capsule; Take 1 capsule (500 mg total) by mouth 2 (two) times daily.  Dispense: 20 capsule; Refill: 0   Heidi [name], Heidi [name], [place]

## 2016-02-08 ENCOUNTER — Ambulatory Visit (INDEPENDENT_AMBULATORY_CARE_PROVIDER_SITE_OTHER): Payer: BC Managed Care – PPO | Admitting: Family Medicine

## 2016-02-08 VITALS — BP 119/83 | HR 76 | Temp 98.0°F | Ht 61.0 in | Wt 123.4 lb

## 2016-02-08 DIAGNOSIS — E871 Hypo-osmolality and hyponatremia: Secondary | ICD-10-CM

## 2016-02-08 DIAGNOSIS — E119 Type 2 diabetes mellitus without complications: Secondary | ICD-10-CM | POA: Diagnosis not present

## 2016-02-08 DIAGNOSIS — R5383 Other fatigue: Secondary | ICD-10-CM | POA: Diagnosis not present

## 2016-02-08 LAB — POC URINALSYSI DIPSTICK (AUTOMATED)
BILIRUBIN UA: NEGATIVE
Blood, UA: NEGATIVE
GLUCOSE UA: NEGATIVE
Ketones, UA: NEGATIVE
LEUKOCYTES UA: NEGATIVE
NITRITE UA: NEGATIVE
PH UA: 6
Protein, UA: NEGATIVE
Spec Grav, UA: >=1.03
UROBILINOGEN UA: 0.2

## 2016-02-08 LAB — COMPREHENSIVE METABOLIC PANEL
ALBUMIN: 3.9 g/dL (ref 3.5–5.2)
ALT: 40 U/L — ABNORMAL HIGH (ref 0–35)
AST: 39 U/L — ABNORMAL HIGH (ref 0–37)
Alkaline Phosphatase: 79 U/L (ref 39–117)
BUN: 10 mg/dL (ref 6–23)
CHLORIDE: 106 meq/L (ref 96–112)
CO2: 28 meq/L (ref 19–32)
Calcium: 9.1 mg/dL (ref 8.4–10.5)
Creatinine, Ser: 0.83 mg/dL (ref 0.40–1.20)
GFR: 88.77 mL/min (ref >60.00)
Glucose, Bld: 90 mg/dL (ref 70–99)
POTASSIUM: 4.6 meq/L (ref 3.5–5.1)
SODIUM: 140 meq/L (ref 135–145)
Total Bilirubin: 0.3 mg/dL (ref 0.2–1.2)
Total Protein: 7.9 g/dL (ref 6.0–8.3)

## 2016-02-08 LAB — CBC
HEMATOCRIT: 37.2 % (ref 36.0–46.0)
HEMOGLOBIN: 12.2 g/dL (ref 12.0–15.0)
MCHC: 32.8 g/dL (ref 30.0–36.0)
MCV: 85.7 fl (ref 78.0–100.0)
Platelets: 206 10*3/uL (ref 150.0–400.0)
RBC: 4.34 Mil/uL (ref 3.87–5.11)
RDW: 13.2 % (ref 11.5–15.5)
WBC: 5.5 10*3/uL (ref 4.0–10.5)

## 2016-02-08 LAB — MICROALBUMIN / CREATININE URINE RATIO
Creatinine,U: 194.9 mg/dL
MICROALB/CREAT RATIO: 0.4 mg/g (ref 0.0–30.0)
Microalb, Ur: <0.7 mg/dL (ref 0.0–1.9)

## 2016-02-08 LAB — HEMOGLOBIN A1C: Hgb A1c MFr Bld: 5.8 % (ref 4.6–6.5)

## 2016-02-08 LAB — TSH: TSH: 1.32 u[IU]/mL (ref 0.35–4.50)

## 2016-02-08 NOTE — Patient Instructions (Signed)
We will look at your labs today including your sodium and I will be in touch with your asap

## 2016-02-08 NOTE — Progress Notes (Signed)
[facility] [address], [address] [place], [place] [address] [phone]  Date:  02/08/2016   Name:  Heidi [name]   DOB:  07/14/1950   MRN:  [id number]  PCP:  Abbe Amsterdam, MD    Chief Complaint: Fatigue (Hx of low sodium, fatigue and trouble falling and staying asleep x 3 days. )   History of Present Illness:  Heidi [name] is a 66 y.o. very pleasant female patient who presents with the following:  From [place].  Has been here for the last 28 years continuously.  Having weakness (tired feeling), not sleeping at all for the last 3 days.  Feeling tired, but just can't sleep.  Thanks that her sodium is low as it has been in the past.  No SOB, CP, palpitations.  Denies any change in stress level, change in diet.  To keep her sodium up, she had increased the sodium in her diet and drinking Gatorade.  Denies vomting or diarrhea.  Last vomited 01-09-2016.  During the last week has drank 1 bottle of gatorade and increased salt.  Denies tremors.  Head feels dizzy.  Urine output is the same.  She states that she has decreased her fluid intake, because of her sodium.  Urine is yellow.  Last saw nephrology on 03-24-2015. They felt that her hyponatremia was just due to excessive water intake associated with a GI illness  She has not noted any HA, ST, cough, fever.  She did throw up once a few weeks ago due to eating airplane food  Patient Active Problem List   Diagnosis Date Noted  . Hyponatremia 03/03/2015  . Special screening for malignant neoplasms, colon 12/10/2012  . Lymphocytosis (symptomatic)   . Disorder of bone and cartilage, unspecified   . Type II or unspecified type diabetes mellitus without mention of complication, uncontrolled   . Reflux esophagitis   . Anemia, unspecified   . Other and unspecified hyperlipidemia   . Lymphocytosis 01/16/2012  . Idiopathic eosinophilia 04/05/2011  . NONSPECIFIC ABNORMAL RESULTS LIVR  FUNCTION STUDY 08/15/2008  . ANEMIA, HX OF 08/15/2008    Past Medical History:  Diagnosis Date  . Allergic rhinitis due to pollen   . Allergy   . Anemia, unspecified   . Blood transfusion without reported diagnosis    1979  . Disorder of bone and cartilage, unspecified   . Eosinophilia   . Lymphocytosis (symptomatic)   . Osteoporosis   . Other abnormal blood chemistry   . Other and unspecified hyperlipidemia   . Pain in joint, shoulder region   . Reflux esophagitis   . Type II or unspecified type diabetes mellitus without mention of complication, uncontrolled   . Umbilical hernia without mention of obstruction or gangrene   . Unspecified disorder of liver     Past Surgical History:  Procedure Laterality Date  . CESAREAN SECTION    . COLONOSCOPY  2015   Dr. Arlyce Dice, normal exam    Social History  Substance Use Topics  . Smoking status: Never Smoker  . Smokeless tobacco: Never Used  . Alcohol use No    Family History  Problem Relation Age of Onset  . Pancreatic cancer Neg Hx   . Colon cancer Neg Hx   . Esophageal cancer Neg Hx   . Stomach cancer Neg Hx     No Known Allergies  Medication list has been reviewed and updated.  Current Outpatient Prescriptions on File Prior to  Visit  Medication Sig Dispense Refill  . calcium-vitamin D (OSCAL WITH D) 500-200 MG-UNIT per tablet Take 1 tablet by mouth daily.    . cetirizine (ZYRTEC) 10 MG tablet Take 10 mg by mouth daily.    . COD LIVER OIL PO Take 1 capsule by mouth daily.    . Multiple Vitamin (MULTIVITAMIN) tablet Take 1 tablet by mouth daily.    . ondansetron (ZOFRAN) 4 MG tablet Take 1 tablet (4 mg total) by mouth every 8 (eight) hours as needed for nausea or vomiting. 20 tablet 0  . sucralfate (CARAFATE) 1 g tablet Take 1 tablet (1 g total) by mouth 4 (four) times daily -  with meals and at bedtime. Use as needed 40 tablet 1  . XIIDRA 5 % SOLN Place into both eyes 2 (two) times daily.     Current  Facility-Administered Medications on File Prior to Visit  Medication Dose Route Frequency Provider Last Rate Last Dose  . 0.9 %  sodium chloride infusion  500 mL Intravenous Continuous Marsa ArisKavitha Nandigam V, MD        Review of Systems:  As per HPI- otherwise negative. No fever, chills, cough, ST, body aches.  Does not feel like she is sick or has the flu  Physical Examination: Vitals:   02/08/16 0937  BP: 119/83  Pulse: 76  Temp: 98 F (36.7 C)   Vitals:   02/08/16 0937  Weight: 123 lb 6.4 oz (56 kg)  Height: 5\' 1"  (1.549 m)   Body mass index is 23.32 kg/m. Ideal Body Weight: Weight in (lb) to have BMI = 25: 132  GEN: WDWN, NAD, Non-toxic, A & O x 3, normal weight, looks well HEENT: Atraumatic, Normocephalic. Neck supple. No masses, No LAD.  Bilateral TM wnl, oropharynx normal.  PEERL,EOMI.   Ears and Nose: No external deformity. CV: RRR, No M/G/R. No JVD. No thrill. No extra heart sounds. PULM: CTA B, no wheezes, crackles, rhonchi. No retractions. No resp. distress. No accessory muscle use. ABD: S, NT, ND EXTR: No c/c/e NEURO Normal gait.  PSYCH: Normally interactive. Conversant. Not depressed or anxious appearing.  Calm demeanor.   Results for orders placed or performed in visit on 02/08/16  POCT Urinalysis Dipstick (Automated)  Result Value Ref Range   Color, UA yellow    Clarity, UA clear    Glucose, UA neg    Bilirubin, UA neg    Ketones, UA neg    Spec Grav, UA >=1.030    Blood, UA neg    pH, UA 6.0    Protein, UA neg    Urobilinogen, UA 0.2    Nitrite, UA neg    Leukocytes, UA Negative Negative    Assessment and Plan: Hyponatremia - Plan: Comprehensive metabolic panel, POCT Urinalysis Dipstick (Automated), CANCELED: POCT urinalysis dipstick  Fatigue, unspecified type - Plan: CBC, TSH  Diet-controlled diabetes mellitus (HCC) - Plan: Hemoglobin A1c, Urine Microalbumin w/creat. ratio  Here today with vague malaise- suspicion of hyponatremia which she has  had in the past Labs pending- will follow- up with her asap  She also has diet controlled diabetes/ borderline DM.  Will check her A1c and microalbumin today  Signed Abbe AmsterdamJessica Copland, MD

## 2016-02-08 NOTE — Progress Notes (Signed)
Pre visit review using our clinic review tool, if applicable. No additional management support is needed unless otherwise documented below in the visit note. 

## 2016-02-09 ENCOUNTER — Encounter: Payer: Self-pay | Admitting: Family Medicine

## 2016-03-21 ENCOUNTER — Encounter: Payer: Self-pay | Admitting: Family Medicine

## 2016-03-21 ENCOUNTER — Ambulatory Visit (INDEPENDENT_AMBULATORY_CARE_PROVIDER_SITE_OTHER): Payer: BC Managed Care – PPO | Admitting: Family Medicine

## 2016-03-21 VITALS — BP 114/64 | HR 66 | Temp 97.8°F | Ht 61.0 in | Wt 120.4 lb

## 2016-03-21 DIAGNOSIS — F5101 Primary insomnia: Secondary | ICD-10-CM

## 2016-03-21 DIAGNOSIS — Z0000 Encounter for general adult medical examination without abnormal findings: Secondary | ICD-10-CM | POA: Diagnosis not present

## 2016-03-21 NOTE — Progress Notes (Signed)
zPre visit review using our clinic tool,if applicable. No additional management support is needed unless otherwise documented below in the visit note.

## 2016-03-21 NOTE — Patient Instructions (Signed)
It was great to see you today!  Let's plan to check your cholesterol and A1c in a few months. Otherwise your recent labs look fine  For sleep, I would recommend that you try doxylamine.  This is available OTC and is often marketed for sleep.  A 1/2 tablet may be enough for you However if this does not help please let me know!  Health Maintenance for Postmenopausal Women Menopause is a normal process in which your reproductive ability comes to an end. This process happens gradually over a span of months to years, usually between the ages of 71 and 47. Menopause is complete when you have missed 12 consecutive menstrual periods. It is important to talk with your health care provider about some of the most common conditions that affect postmenopausal women, such as heart disease, cancer, and bone loss (osteoporosis). Adopting a healthy lifestyle and getting preventive care can help to promote your health and wellness. Those actions can also lower your chances of developing some of these common conditions. What should I know about menopause? During menopause, you may experience a number of symptoms, such as:  Moderate-to-severe hot flashes.  Night sweats.  Decrease in sex drive.  Mood swings.  Headaches.  Tiredness.  Irritability.  Memory problems.  Insomnia. Choosing to treat or not to treat menopausal changes is an individual decision that you make with your health care provider. What should I know about hormone replacement therapy and supplements? Hormone therapy products are effective for treating symptoms that are associated with menopause, such as hot flashes and night sweats. Hormone replacement carries certain risks, especially as you become older. If you are thinking about using estrogen or estrogen with progestin treatments, discuss the benefits and risks with your health care provider. What should I know about heart disease and stroke? Heart disease, heart attack, and stroke  become more likely as you age. This may be due, in part, to the hormonal changes that your body experiences during menopause. These can affect how your body processes dietary fats, triglycerides, and cholesterol. Heart attack and stroke are both medical emergencies. There are many things that you can do to help prevent heart disease and stroke:  Have your blood pressure checked at least every 1-2 years. High blood pressure causes heart disease and increases the risk of stroke.  If you are 33-57 years old, ask your health care provider if you should take aspirin to prevent a heart attack or a stroke.  Do not use any tobacco products, including cigarettes, chewing tobacco, or electronic cigarettes. If you need help quitting, ask your health care provider.  It is important to eat a healthy diet and maintain a healthy weight.  Be sure to include plenty of vegetables, fruits, low-fat dairy products, and lean protein.  Avoid eating foods that are high in solid fats, added sugars, or salt (sodium).  Get regular exercise. This is one of the most important things that you can do for your health.  Try to exercise for at least 150 minutes each week. The type of exercise that you do should increase your heart rate and make you sweat. This is known as moderate-intensity exercise.  Try to do strengthening exercises at least twice each week. Do these in addition to the moderate-intensity exercise.  Know your numbers.Ask your health care provider to check your cholesterol and your blood glucose. Continue to have your blood tested as directed by your health care provider. What should I know about cancer screening? There are  several types of cancer. Take the following steps to reduce your risk and to catch any cancer development as early as possible. Breast Cancer  Practice breast self-awareness.  This means understanding how your breasts normally appear and feel.  It also means doing regular breast  self-exams. Let your health care provider know about any changes, no matter how small.  If you are 94 or older, have a clinician do a breast exam (clinical breast exam or CBE) every year. Depending on your age, family history, and medical history, it may be recommended that you also have a yearly breast X-ray (mammogram).  If you have a family history of breast cancer, talk with your health care provider about genetic screening.  If you are at high risk for breast cancer, talk with your health care provider about having an MRI and a mammogram every year.  Breast cancer (BRCA) gene test is recommended for women who have family members with BRCA-related cancers. Results of the assessment will determine the need for genetic counseling and BRCA1 and for BRCA2 testing. BRCA-related cancers include these types:  Breast. This occurs in males or females.  Ovarian.  Tubal. This may also be called fallopian tube cancer.  Cancer of the abdominal or pelvic lining (peritoneal cancer).  Prostate.  Pancreatic. Cervical, Uterine, and Ovarian Cancer  Your health care provider may recommend that you be screened regularly for cancer of the pelvic organs. These include your ovaries, uterus, and vagina. This screening involves a pelvic exam, which includes checking for microscopic changes to the surface of your cervix (Pap test).  For women ages 21-65, health care providers may recommend a pelvic exam and a Pap test every three years. For women ages 79-65, they may recommend the Pap test and pelvic exam, combined with testing for human papilloma virus (HPV), every five years. Some types of HPV increase your risk of cervical cancer. Testing for HPV may also be done on women of any age who have unclear Pap test results.  Other health care providers may not recommend any screening for nonpregnant women who are considered low risk for pelvic cancer and have no symptoms. Ask your health care provider if a screening  pelvic exam is right for you.  If you have had past treatment for cervical cancer or a condition that could lead to cancer, you need Pap tests and screening for cancer for at least 20 years after your treatment. If Pap tests have been discontinued for you, your risk factors (such as having a new sexual partner) need to be reassessed to determine if you should start having screenings again. Some women have medical problems that increase the chance of getting cervical cancer. In these cases, your health care provider may recommend that you have screening and Pap tests more often.  If you have a family history of uterine cancer or ovarian cancer, talk with your health care provider about genetic screening.  If you have vaginal bleeding after reaching menopause, tell your health care provider.  There are currently no reliable tests available to screen for ovarian cancer. Lung Cancer  Lung cancer screening is recommended for adults 49-30 years old who are at high risk for lung cancer because of a history of smoking. A yearly low-dose CT scan of the lungs is recommended if you:  Currently smoke.  Have a history of at least 30 pack-years of smoking and you currently smoke or have quit within the past 15 years. A pack-year is smoking an average of  one pack of cigarettes per day for one year. Yearly screening should:  Continue until it has been 15 years since you quit.  Stop if you develop a health problem that would prevent you from having lung cancer treatment. Colorectal Cancer  This type of cancer can be detected and can often be prevented.  Routine colorectal cancer screening usually begins at age 65 and continues through age 39.  If you have risk factors for colon cancer, your health care provider may recommend that you be screened at an earlier age.  If you have a family history of colorectal cancer, talk with your health care provider about genetic screening.  Your health care provider  may also recommend using home test kits to check for hidden blood in your stool.  A small camera at the end of a tube can be used to examine your colon directly (sigmoidoscopy or colonoscopy). This is done to check for the earliest forms of colorectal cancer.  Direct examination of the colon should be repeated every 5-10 years until age 38. However, if early forms of precancerous polyps or small growths are found or if you have a family history or genetic risk for colorectal cancer, you may need to be screened more often. Skin Cancer  Check your skin from head to toe regularly.  Monitor any moles. Be sure to tell your health care provider:  About any new moles or changes in moles, especially if there is a change in a mole's shape or color.  If you have a mole that is larger than the size of a pencil eraser.  If any of your family members has a history of skin cancer, especially at a young age, talk with your health care provider about genetic screening.  Always use sunscreen. Apply sunscreen liberally and repeatedly throughout the day.  Whenever you are outside, protect yourself by wearing long sleeves, pants, a wide-brimmed hat, and sunglasses. What should I know about osteoporosis? Osteoporosis is a condition in which bone destruction happens more quickly than new bone creation. After menopause, you may be at an increased risk for osteoporosis. To help prevent osteoporosis or the bone fractures that can happen because of osteoporosis, the following is recommended:  If you are 61-27 years old, get at least 1,000 mg of calcium and at least 600 mg of vitamin D per day.  If you are older than age 10 but younger than age 55, get at least 1,200 mg of calcium and at least 600 mg of vitamin D per day.  If you are older than age 30, get at least 1,200 mg of calcium and at least 800 mg of vitamin D per day. Smoking and excessive alcohol intake increase the risk of osteoporosis. Eat foods that are  rich in calcium and vitamin D, and do weight-bearing exercises several times each week as directed by your health care provider. What should I know about how menopause affects my mental health? Depression may occur at any age, but it is more common as you become older. Common symptoms of depression include:  Low or sad mood.  Changes in sleep patterns.  Changes in appetite or eating patterns.  Feeling an overall lack of motivation or enjoyment of activities that you previously enjoyed.  Frequent crying spells. Talk with your health care provider if you think that you are experiencing depression. What should I know about immunizations? It is important that you get and maintain your immunizations. These include:  Tetanus, diphtheria, and pertussis (Tdap)  booster vaccine.  Influenza every year before the flu season begins.  Pneumonia vaccine.  Shingles vaccine. Your health care provider may also recommend other immunizations. This information is not intended to replace advice given to you by your health care provider. Make sure you discuss any questions you have with your health care provider. Document Released: 02/11/2005 Document Revised: 07/10/2015 Document Reviewed: 09/23/2014 Elsevier Interactive Patient Education  2017 [employer].

## 2016-03-21 NOTE — Progress Notes (Signed)
[facility] [address], [address] [place], [place] [address] [phone] [id number]  Date:  03/21/2016   Name:  Heidi [name]   DOB:  May 30, 1950   MRN:  [id number]  PCP:  Abbe Amsterdam, MD    Chief Complaint: Annual Exam (Problems sleeping)   History of Present Illness:  Heidi [name] is a 66 y.o. very pleasant female patient who presents with the following:  Here today for a CPE History of DM, hyponatremia, borderline DM controlled with diet Pap done about 3 years ago- I don't have report in epic so unsure if she had HPV included  She sees OBG- has an appt later on this month for her mammo and pap if indicated.    She is not fasting- had a few crackers this am only   She has concern of not sleeping.  She notes that when she tries to settle down at night she will feel like her mind is racing and she cannot get to sleep.  During the day she feels fine- she does not feel anxious or depressed.   She is staying with her daughter and her 4 children- however things are quiet at night and the living situation is good for her overall   This is not the first time she had difficulty sleeping- in the past this has gone away with taking zyrtec.  However she has tried zyrtec and it did not work this time   She is a never smoker For exercise she enjoys walking for 45 minutes a day, and she also goes to the gym a few times a week as she is able   She did have an episode of hyponatremia about a year ago- this has resolved but she is often nervous about it coming back She is post- menopausal  Colonoscopy is UTD BP Readings from Last 3 Encounters:  03/21/16 114/64  02/08/16 119/83  12/01/15 118/64   Wt Readings from Last 3 Encounters:  03/21/16 120 lb 6.4 oz (54.6 kg)  02/08/16 123 lb 6.4 oz (56 kg)  12/01/15 124 lb (56.2 kg)     Lab Results  Component Value Date   HGBA1C 5.8 02/08/2016     Patient Active Problem List   Diagnosis  Date Noted  . Hyponatremia 03/03/2015  . Special screening for malignant neoplasms, colon 12/10/2012  . Lymphocytosis (symptomatic)   . Disorder of bone and cartilage, unspecified   . Type II or unspecified type diabetes mellitus without mention of complication, uncontrolled   . Reflux esophagitis   . Anemia, unspecified   . Other and unspecified hyperlipidemia   . Lymphocytosis 01/16/2012  . Idiopathic eosinophilia 04/05/2011  . NONSPECIFIC ABNORMAL RESULTS LIVR FUNCTION STUDY 08/15/2008  . ANEMIA, HX OF 08/15/2008    Past Medical History:  Diagnosis Date  . Allergic rhinitis due to pollen   . Allergy   . Anemia, unspecified   . Blood transfusion without reported diagnosis    1979  . Disorder of bone and cartilage, unspecified   . Eosinophilia   . Lymphocytosis (symptomatic)   . Osteoporosis   . Other abnormal blood chemistry   . Other and unspecified hyperlipidemia   . Pain in joint, shoulder region   . Reflux esophagitis   . Type II or unspecified type diabetes mellitus without mention of complication, uncontrolled   . Umbilical hernia without mention of obstruction or gangrene   . Unspecified disorder of liver     Past  Surgical History:  Procedure Laterality Date  . CESAREAN SECTION    . COLONOSCOPY  2015   Dr. Arlyce DiceKaplan, normal exam    Social History  Substance Use Topics  . Smoking status: Never Smoker  . Smokeless tobacco: Never Used  . Alcohol use No    Family History  Problem Relation Age of Onset  . Pancreatic cancer Neg Hx   . Colon cancer Neg Hx   . Esophageal cancer Neg Hx   . Stomach cancer Neg Hx     No Known Allergies  Medication list has been reviewed and updated.  Current Outpatient Prescriptions on File Prior to Visit  Medication Sig Dispense Refill  . calcium-vitamin D (OSCAL WITH D) 500-200 MG-UNIT per tablet Take 1 tablet by mouth daily.    . cetirizine (ZYRTEC) 10 MG tablet Take 10 mg by mouth daily.    . COD LIVER OIL PO Take 1  capsule by mouth daily.    . Multiple Vitamin (MULTIVITAMIN) tablet Take 1 tablet by mouth daily.    . ondansetron (ZOFRAN) 4 MG tablet Take 1 tablet (4 mg total) by mouth every 8 (eight) hours as needed for nausea or vomiting. 20 tablet 0  . sucralfate (CARAFATE) 1 g tablet Take 1 tablet (1 g total) by mouth 4 (four) times daily -  with meals and at bedtime. Use as needed 40 tablet 1  . XIIDRA 5 % SOLN Place into both eyes 2 (two) times daily.     Current Facility-Administered Medications on File Prior to Visit  Medication Dose Route Frequency Provider Last Rate Last Dose  . 0.9 %  sodium chloride infusion  500 mL Intravenous Continuous Marsa ArisKavitha Nandigam V, MD        Review of Systems:  As per HPI- otherwise negative. No CP or SOB with exercise Weight is stable Energy level is normal   Physical Examination: Vitals:   03/21/16 1025  BP: 114/64  Pulse: 66  Temp: 97.8 F (36.6 C)   Vitals:   03/21/16 1025  Weight: 120 lb 6.4 oz (54.6 kg)  Height: 5\' 1"  (1.549 m)   Body mass index is 22.75 kg/m. Ideal Body Weight: Weight in (lb) to have BMI = 25: 132  GEN: WDWN, NAD, Non-toxic, A & O x 3, normal weight, looks quite well HEENT: Atraumatic, Normocephalic. Neck supple. No masses, No LAD.  Bilateral TM wnl, oropharynx normal.  PEERL,EOMI.   Ears and Nose: No external deformity. CV: RRR, No M/G/R. No JVD. No thrill. No extra heart sounds. PULM: CTA B, no wheezes, crackles, rhonchi. No retractions. No resp. distress. No accessory muscle use. ABD: S, NT, ND EXTR: No c/c/e NEURO Normal gait.  PSYCH: Normally interactive. Conversant. Not depressed or anxious appearing.  Calm demeanor.    Assessment and Plan: Physical exam  Primary insomnia  Here today for a CPE and to discuss insomnia.  Considered trazodone for her but this is associated with hyponatremia- choose to try doxylamine OTC first instead.  She will let me knw if not helpful Given information about health maintenance  for her age Continue exercise and maintain healthy weight  Seeing GYN soon She will come in for a cholesterol and a1c in 4-6 months    Signed Abbe AmsterdamJessica Donica Derouin, MD

## 2016-04-27 ENCOUNTER — Encounter: Payer: Self-pay | Admitting: Family Medicine

## 2016-05-03 ENCOUNTER — Encounter: Payer: Self-pay | Admitting: Family Medicine

## 2016-05-03 ENCOUNTER — Telehealth: Payer: Self-pay | Admitting: Family Medicine

## 2016-05-03 DIAGNOSIS — Z7184 Encounter for health counseling related to travel: Secondary | ICD-10-CM

## 2016-05-03 NOTE — Telephone Encounter (Signed)
Caller name: Ntuma Relationship to patient: Daughter Can be reached: [phone] Pharmacy:  Reason for call: Patient's daughter called to request a referral to [facility]. States provider has done before.

## 2016-05-08 ENCOUNTER — Encounter: Payer: Self-pay | Admitting: Family Medicine

## 2016-07-19 ENCOUNTER — Encounter (HOSPITAL_COMMUNITY): Payer: Self-pay

## 2016-07-19 ENCOUNTER — Ambulatory Visit (HOSPITAL_COMMUNITY)
Admission: RE | Admit: 2016-07-19 | Discharge: 2016-07-19 | Disposition: A | Discharge status: Home / Self Care | Payer: BC Managed Care – PPO | Source: Ambulatory Visit | Attending: Obstetrics | Admitting: Obstetrics

## 2016-07-19 DIAGNOSIS — M810 Age-related osteoporosis without current pathological fracture: Secondary | ICD-10-CM | POA: Diagnosis present

## 2016-07-19 MED ORDER — SODIUM CHLORIDE 0.9 % IV SOLN
INTRAVENOUS | Status: DC
  Administered 2016-07-19: 09:00:00 via INTRAVENOUS

## 2016-07-19 MED ORDER — ZOLEDRONIC ACID 5 MG/100ML IV SOLN
5.0000 mg | Freq: Once | INTRAVENOUS | Status: AC
  Administered 2016-07-19: 5 mg via INTRAVENOUS
  Filled 2016-07-19: qty 100

## 2016-07-19 NOTE — Discharge Instructions (Signed)
Drink  Fluids/water as tolerated over the next 72 hours Tylenol or ibuprofen if needed for aches and pains next few days Continue Calcium and Vit D as directed by your MD    Reclast Zoledronic Acid injection (Paget's Disease, Osteoporosis) What is this medicine? ZOLEDRONIC ACID (ZOE le dron ik AS id) lowers the amount of calcium loss from bone. It is used to treat Paget's disease and osteoporosis in women. This medicine may be used for other purposes; ask your health care provider or pharmacist if you have questions. COMMON BRAND NAME(S): Reclast, Zometa What should I tell my health care provider before I take this medicine? They need to know if you have any of these conditions: -aspirin-sensitive asthma -cancer, especially if you are receiving medicines used to treat cancer -dental disease or wear dentures -infection -kidney disease -low levels of calcium in the blood -past surgery on the parathyroid gland or intestines -receiving corticosteroids like dexamethasone or prednisone -an unusual or allergic reaction to zoledronic acid, other medicines, foods, dyes, or preservatives -pregnant or trying to get pregnant -breast-feeding How should I use this medicine? This medicine is for infusion into a vein. It is given by a health care professional in a hospital or clinic setting. Talk to your pediatrician regarding the use of this medicine in children. This medicine is not approved for use in children. Overdosage: If you think you have taken too much of this medicine contact a poison control center or emergency room at once. NOTE: This medicine is only for you. Do not share this medicine with others. What if I miss a dose? It is important not to miss your dose. Call your doctor or health care professional if you are unable to keep an appointment. What may interact with this medicine? -certain antibiotics given by injection -NSAIDs, medicines for pain and inflammation, like ibuprofen  or naproxen -some diuretics like bumetanide, furosemide -teriparatide This list may not describe all possible interactions. Give your health care provider a list of all the medicines, herbs, non-prescription drugs, or dietary supplements you use. Also tell them if you smoke, drink alcohol, or use illegal drugs. Some items may interact with your medicine. What should I watch for while using this medicine? Visit your doctor or health care professional for regular checkups. It may be some time before you see the benefit from this medicine. Do not stop taking your medicine unless your doctor tells you to. Your doctor may order blood tests or other tests to see how you are doing. Women should inform their doctor if they wish to become pregnant or think they might be pregnant. There is a potential for serious side effects to an unborn child. Talk to your health care professional or pharmacist for more information. You should make sure that you get enough calcium and vitamin D while you are taking this medicine. Discuss the foods you eat and the vitamins you take with your health care professional. Some people who take this medicine have severe bone, joint, and/or muscle pain. This medicine may also increase your risk for jaw problems or a broken thigh bone. Tell your doctor right away if you have severe pain in your jaw, bones, joints, or muscles. Tell your doctor if you have any pain that does not go away or that gets worse. Tell your dentist and dental surgeon that you are taking this medicine. You should not have major dental surgery while on this medicine. See your dentist to have a dental exam and fix  any dental problems before starting this medicine. Take good care of your teeth while on this medicine. Make sure you see your dentist for regular follow-up appointments. What side effects may I notice from receiving this medicine? Side effects that you should report to your doctor or health care professional  as soon as possible: -allergic reactions like skin rash, itching or hives, swelling of the face, lips, or tongue -anxiety, confusion, or depression -breathing problems -changes in vision -eye pain -feeling faint or lightheaded, falls -jaw pain, especially after dental work -mouth sores -muscle cramps, stiffness, or weakness -redness, blistering, peeling or loosening of the skin, including inside the mouth -trouble passing urine or change in the amount of urine Side effects that usually do not require medical attention (report to your doctor or health care professional if they continue or are bothersome): -bone, joint, or muscle pain -constipation -diarrhea -fever -hair loss -irritation at site where injected -loss of appetite -nausea, vomiting -stomach upset -trouble sleeping -trouble swallowing -weak or tired This list may not describe all possible side effects. Call your doctor for medical advice about side effects. You may report side effects to FDA at 1-800-FDA-1088. Where should I keep my medicine? This drug is given in a hospital or clinic and will not be stored at home. NOTE: This sheet is a summary. It may not cover all possible information. If you have questions about this medicine, talk to your doctor, pharmacist, or health care provider.  2018 Elsevier/Gold Standard (2013-05-18 14:19:57)

## 2016-09-05 NOTE — Progress Notes (Addendum)
[facility] at [facility] [address], [address] [place], [place] [address] [phone]  Date:  09/07/2016   Name:  Heidi [name]   DOB:  Dec 15, 1950   MRN:  [id number]  PCP:  Pearline Cables, MD    Chief Complaint: Shoulder Pain (Left)   History of Present Illness:  Heidi [name] is a 66 y.o. very pleasant female patient who presents with the following:  History of diet controlled DM, hyponatremia, hyperlipidemia.  Last seen by myself about 6 months ago for her CPE Here today with concern of LEFT shoulder pain for about one week.   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  She has not done any new or different activities at home  She has had shoulder issues in the past- was told that she had bursitis No chest pain or SOB No fever, chills, vomiting She has used some tylenol which does help She notes more pain when she tries to use the shoulder during the day  She is due for an A1c, flu shot and pneumovax today Her pap is done per OBG However she gets her flu shot at [facility] for free- will have it done there Will also check her BMP due to history of hyponatremia and lipids today   Lab Results  Component Value Date   HGBA1C 5.8 02/08/2016   She ate just a little oatmeal so far today  Patient Active Problem List   Diagnosis Date Noted  . Hyponatremia 03/03/2015  . Special screening for malignant neoplasms, colon 12/10/2012  . Lymphocytosis (symptomatic)   . Disorder of bone and cartilage, unspecified   . Type II or unspecified type diabetes mellitus without mention of complication, uncontrolled   . Reflux esophagitis   . Anemia, unspecified   . Other and unspecified hyperlipidemia   . Lymphocytosis 01/16/2012  . Idiopathic eosinophilia 04/05/2011  . NONSPECIFIC ABNORMAL RESULTS LIVR FUNCTION STUDY 08/15/2008   . ANEMIA, HX OF 08/15/2008    Past Medical History:  Diagnosis Date  . Allergic rhinitis due to pollen   . Allergy   . Anemia, unspecified   . Blood transfusion without reported diagnosis    1979  . Disorder of bone and cartilage, unspecified   . Eosinophilia   . Lymphocytosis (symptomatic)   . Osteoporosis   . Other abnormal blood chemistry   . Other and unspecified hyperlipidemia   . Pain in joint, shoulder region   . Reflux esophagitis   . Type II or unspecified type diabetes mellitus without mention of complication, uncontrolled   . Umbilical hernia without mention of obstruction or gangrene   . Unspecified disorder of liver     Past Surgical History:  Procedure Laterality Date  . CESAREAN SECTION    . COLONOSCOPY  2015   Dr. Arlyce Dice, normal exam    Social History  Substance Use Topics  . Smoking status: Never Smoker  . Smokeless tobacco: Never Used  . Alcohol use No    Family History  Problem Relation Age of Onset  . Pancreatic cancer Neg Hx   . Colon cancer Neg Hx   . Esophageal cancer Neg Hx   . Stomach cancer Neg Hx     No Known Allergies  Medication list has been reviewed and updated.  Current Outpatient Prescriptions  on File Prior to Visit  Medication Sig Dispense Refill  . calcium-vitamin D (OSCAL WITH D) 500-200 MG-UNIT per tablet Take 1 tablet by mouth daily.    . cetirizine (ZYRTEC) 10 MG tablet Take 10 mg by mouth daily.    . COD LIVER OIL PO Take 1 capsule by mouth daily.    . Multiple Vitamin (MULTIVITAMIN) tablet Take 1 tablet by mouth daily.    . ondansetron (ZOFRAN) 4 MG tablet Take 1 tablet (4 mg total) by mouth every 8 (eight) hours as needed for nausea or vomiting. 20 tablet 0  . sucralfate (CARAFATE) 1 g tablet Take 1 tablet (1 g total) by mouth 4 (four) times daily -  with meals and at bedtime. Use as needed 40 tablet 1  . XIIDRA 5 % SOLN Place into both eyes 2 (two) times daily.     Current Facility-Administered Medications on File  Prior to Visit  Medication Dose Route Frequency Provider Last Rate Last Dose  . 0.9 %  sodium chloride infusion  500 mL Intravenous Continuous Nandigam, Eleonore ChiquitoKavitha V, MD        Review of Systems:  As per HPI- otherwise negative.   Physical Examination: Vitals:   09/07/16 0911  BP: 122/79  Pulse: 74  Temp: 97.7 F (36.5 C)  SpO2: 99%   Vitals:   09/07/16 0911  Weight: 130 lb 3.2 oz (59.1 kg)  Height: 5\' 1"  (1.549 m)   Body mass index is 24.6 kg/m. Ideal Body Weight: Weight in (lb) to have BMI = 25: 132  GEN: WDWN, NAD, Non-toxic, A & O x 3, normal weight, looks well HEENT: Atraumatic, Normocephalic. Neck supple. No masses, No LAD.  Bilateral TM wnl, oropharynx normal.  PEERL,EOMI.   No neck pain, normal cervical ROM Left shoulder: she has tenderness over the anterior RCT insertion. Pain with internal rotation and with empty can testing.  Otherwise normal strength and ROM Normal BUE DTR Ears and Nose: No external deformity. CV: RRR, No M/G/R. No JVD. No thrill. No extra heart sounds. PULM: CTA B, no wheezes, crackles, rhonchi. No retractions. No resp. distress. No accessory muscle use. EXTR: No c/c/e NEURO Normal gait.  PSYCH: Normally interactive. Conversant. Not depressed or anxious appearing.  Calm demeanor.    Assessment and Plan: Acute pain of left shoulder - Plan: meloxicam (MOBIC) 7.5 MG tablet  Primary insomnia  Hyponatremia syndrome - Plan: Basic metabolic panel  Controlled type 2 diabetes mellitus without complication, without long-term current use of insulin (HCC) - Plan: Hemoglobin A1c  Mixed hyperlipidemia - Plan: Lipid panel  Immunization due - Plan: Pneumococcal polysaccharide vaccine 23-valent greater than or equal to 2yo subcutaneous/IM, CANCELED: Flu Vaccine QUAD 36+ mos IM  Pneumovax today mobic to use prn for shoulder Given hand- out regarding shoulder injuries  https://orthoinfo.aaos.org/globalassets/pdfs/2017-rehab_shoulder.pdf  She will  work on a few of the exercises listed and use mobic as needed If not better in a couple of week she will let me know and I will plan to refer to PT  See patient instructions for more details.     Signed Abbe AmsterdamJessica Copland, MD  Received her labs  Results for orders placed or performed in visit on 09/07/16  Hemoglobin A1c  Result Value Ref Range   Hgb A1c MFr Bld 5.8 4.6 - 6.5 %  Lipid panel  Result Value Ref Range   Cholesterol 228 (H) 0 - 200 mg/dL   Triglycerides 16.182.0 0.0 - 149.0 mg/dL   HDL 09.6052.30 >45.40>39.00 mg/dL  VLDL 16.4 0.0 - 40.0 mg/dL   LDL Cholesterol 161 (H) 0 - 99 mg/dL   Total CHOL/HDL Ratio 4    NonHDL 175.46   Basic metabolic panel  Result Value Ref Range   Sodium 139 135 - 145 mEq/L   Potassium 4.1 3.5 - 5.1 mEq/L   Chloride 105 96 - 112 mEq/L   CO2 26 19 - 32 mEq/L   Glucose, Bld 87 70 - 99 mg/dL   BUN 13 6 - 23 mg/dL   Creatinine, Ser 0.96 0.40 - 1.20 mg/dL   Calcium 9.3 8.4 - 04.5 mg/dL   GFR 40.98 >11.91 mL/min

## 2016-09-07 ENCOUNTER — Ambulatory Visit (INDEPENDENT_AMBULATORY_CARE_PROVIDER_SITE_OTHER): Payer: BC Managed Care – PPO | Admitting: Family Medicine

## 2016-09-07 ENCOUNTER — Encounter: Payer: Self-pay | Admitting: Family Medicine

## 2016-09-07 VITALS — BP 122/79 | HR 74 | Temp 97.7°F | Ht 61.0 in | Wt 130.2 lb

## 2016-09-07 DIAGNOSIS — F5101 Primary insomnia: Secondary | ICD-10-CM

## 2016-09-07 DIAGNOSIS — E871 Hypo-osmolality and hyponatremia: Secondary | ICD-10-CM | POA: Diagnosis not present

## 2016-09-07 DIAGNOSIS — M25512 Pain in left shoulder: Secondary | ICD-10-CM | POA: Diagnosis not present

## 2016-09-07 DIAGNOSIS — Z23 Encounter for immunization: Secondary | ICD-10-CM

## 2016-09-07 DIAGNOSIS — E119 Type 2 diabetes mellitus without complications: Secondary | ICD-10-CM

## 2016-09-07 DIAGNOSIS — E782 Mixed hyperlipidemia: Secondary | ICD-10-CM | POA: Diagnosis not present

## 2016-09-07 LAB — BASIC METABOLIC PANEL
BUN: 13 mg/dL (ref 6–23)
CALCIUM: 9.3 mg/dL (ref 8.4–10.5)
CO2: 26 mEq/L (ref 19–32)
Chloride: 105 mEq/L (ref 96–112)
Creatinine, Ser: 0.95 mg/dL (ref 0.40–1.20)
GFR: 75.82 mL/min (ref >60.00)
Glucose, Bld: 87 mg/dL (ref 70–99)
Potassium: 4.1 mEq/L (ref 3.5–5.1)
SODIUM: 139 meq/L (ref 135–145)

## 2016-09-07 LAB — LIPID PANEL
Cholesterol: 228 mg/dL — ABNORMAL HIGH (ref 0–200)
HDL: 52.3 mg/dL (ref >39.00)
LDL Cholesterol: 159 mg/dL — ABNORMAL HIGH (ref 0–99)
NONHDL: 175.46
Total CHOL/HDL Ratio: 4
Triglycerides: 82 mg/dL (ref 0.0–149.0)
VLDL: 16.4 mg/dL (ref 0.0–40.0)

## 2016-09-07 LAB — HEMOGLOBIN A1C: HEMOGLOBIN A1C: 5.8 % (ref 4.6–6.5)

## 2016-09-07 MED ORDER — MELOXICAM 7.5 MG PO TABS: 7.5000 mg | ORAL_TABLET | Freq: Every day | ORAL | 0 refills | Status: DC

## 2016-09-07 NOTE — Patient Instructions (Signed)
It was very nice to see you today!  You got your pneumovax 23 pneumonia shot today Do be sure to get a flu shot this fall Use the meloxicam once a day as needed for your shoulder Also, please work on some of the exercises that I gave you in the handout today If your shoulder is not feeling better in about 2 weeks please let me know, and I will refer you to physical therapy

## 2016-09-09 ENCOUNTER — Encounter: Payer: Self-pay | Admitting: Family Medicine

## 2016-09-22 ENCOUNTER — Encounter: Payer: Self-pay | Admitting: Family Medicine

## 2016-09-22 ENCOUNTER — Ambulatory Visit (INDEPENDENT_AMBULATORY_CARE_PROVIDER_SITE_OTHER): Payer: BC Managed Care – PPO | Admitting: Family Medicine

## 2016-09-22 VITALS — BP 139/81 | HR 71 | Temp 98.0°F | Ht 61.0 in | Wt 131.4 lb

## 2016-09-22 DIAGNOSIS — F5101 Primary insomnia: Secondary | ICD-10-CM

## 2016-09-22 DIAGNOSIS — N8110 Cystocele, unspecified: Secondary | ICD-10-CM

## 2016-09-22 DIAGNOSIS — N814 Uterovaginal prolapse, unspecified: Secondary | ICD-10-CM | POA: Diagnosis not present

## 2016-09-22 MED ORDER — TRAZODONE HCL 50 MG PO TABS: 25.0000 mg | ORAL_TABLET | Freq: Every evening | ORAL | 6 refills | Status: DC | PRN

## 2016-09-22 NOTE — Progress Notes (Signed)
[facility] at [facility] [address] [place], [place] [address] [phone]  Date:  09/22/2016   Name:  Kevionna Heffler   DOB:  1950-08-28   MRN:  [id number]  PCP:  Pearline Cables, MD    Chief Complaint: Insomnia (c/o trouble falling and staying asleep x 1 yr. Medication was working but has stopped working over the past 2 weeks. )   History of Present Illness:  Maressa Apollo is a 66 y.o. very pleasant female patient who presents with the following:  Here today to discuss issues with sleep This is a long- standing issue for her She notes that she has not slept well in the last 3 days.  She has laid in bed but is not really sure if she slept at all.  She is feeling quite tired!  She had been using Unisom and it was working well for her until just recently.  She had been using this for the last 6 months  She notes that she tries to relax when she goes to bed,but she cannot get her mind to slow down and rest  She feels tired and would like to sleep. She does not feel like she is manic.   Also, she has noted that her uterus seems to be protruding from her vagina for the last 3-6 months. She is not having any pain from this area, no bleeding She is a pt of Dr. Ernestina Penna.for GYN care but has not contacted her as of yet   We have not used any other sleep medications for her in the past- never used any rx medications  Patient Active Problem List   Diagnosis Date Noted  . Hyponatremia 03/03/2015  . Special screening for malignant neoplasms, colon 12/10/2012  . Lymphocytosis (symptomatic)   . Disorder of bone and cartilage, unspecified   . Type II or unspecified type diabetes mellitus without mention of complication, uncontrolled   . Reflux esophagitis   . Anemia, unspecified   . Other and unspecified hyperlipidemia   . Lymphocytosis 01/16/2012  . Idiopathic eosinophilia 04/05/2011  . NONSPECIFIC ABNORMAL RESULTS LIVR FUNCTION  STUDY 08/15/2008  . ANEMIA, HX OF 08/15/2008    Past Medical History:  Diagnosis Date  . Allergic rhinitis due to pollen   . Allergy   . Anemia, unspecified   . Blood transfusion without reported diagnosis    1979  . Disorder of bone and cartilage, unspecified   . Eosinophilia   . Lymphocytosis (symptomatic)   . Osteoporosis   . Other abnormal blood chemistry   . Other and unspecified hyperlipidemia   . Pain in joint, shoulder region   . Reflux esophagitis   . Type II or unspecified type diabetes mellitus without mention of complication, uncontrolled   . Umbilical hernia without mention of obstruction or gangrene   . Unspecified disorder of liver     Past Surgical History:  Procedure Laterality Date  . CESAREAN SECTION    . COLONOSCOPY  2015   Dr. Arlyce Dice, normal exam    Social History  Substance Use Topics  . Smoking status: Never Smoker  . Smokeless tobacco: Never Used  . Alcohol use No    Family History  Problem Relation Age of Onset  . Pancreatic cancer Neg Hx   . Colon cancer Neg Hx   . Esophageal cancer Neg Hx   . Stomach cancer Neg Hx     No Known Allergies  Medication list  has been reviewed and updated.  Current Outpatient Prescriptions on File Prior to Visit  Medication Sig Dispense Refill  . calcium-vitamin D (OSCAL WITH D) 500-200 MG-UNIT per tablet Take 1 tablet by mouth daily.    . cetirizine (ZYRTEC) 10 MG tablet Take 10 mg by mouth daily.    . COD LIVER OIL PO Take 1 capsule by mouth daily.    . meloxicam (MOBIC) 7.5 MG tablet Take 1 tablet (7.5 mg total) by mouth daily. Use as needed for shoulder pain 30 tablet 0  . Multiple Vitamin (MULTIVITAMIN) tablet Take 1 tablet by mouth daily.    . ondansetron (ZOFRAN) 4 MG tablet Take 1 tablet (4 mg total) by mouth every 8 (eight) hours as needed for nausea or vomiting. 20 tablet 0  . sucralfate (CARAFATE) 1 g tablet Take 1 tablet (1 g total) by mouth 4 (four) times daily -  with meals and at bedtime.  Use as needed 40 tablet 1  . XIIDRA 5 % SOLN Place into both eyes 2 (two) times daily.     Current Facility-Administered Medications on File Prior to Visit  Medication Dose Route Frequency Provider Last Rate Last Dose  . 0.9 %  sodium chloride infusion  500 mL Intravenous Continuous Nandigam, Eleonore Chiquito, MD        Review of Systems:  As per HPI- otherwise negative. No vaginal bleeding She has suffered from insomnia for about 10 years- she reports that when she was younger she slept well   Physical Examination: Vitals:   09/22/16 1403  BP: 139/81  Pulse: 71  Temp: 98 F (36.7 C)  SpO2: 100%   Vitals:   09/22/16 1403  Weight: 131 lb 6.4 oz (59.6 kg)  Height:  (1.549 m)   Body mass index is 24.83 kg/m. Ideal Body Weight: Weight in (lb) to have BMI = 25: 132  GEN: WDWN, NAD, Non-toxic, A & O x 3, looks well HEENT: Atraumatic, Normocephalic. Neck supple. No masses, No LAD. Ears and Nose: No external deformity. CV: RRR, No M/G/R. No JVD. No thrill. No extra heart sounds. PULM: CTA B, no wheezes, crackles, rhonchi. No retractions. No resp. distress. No accessory muscle use. ABD: S, NT, ND EXTR: No c/c/e NEURO Normal gait.  PSYCH: Normally interactive. Conversant. Not depressed or anxious appearing.  Calm demeanor.  GU: she has minimal prolapse of vaginal tissues with valsalva while supine. Pt reports that this is generally worse if she is sitting (like on the toilet) or standing   Assessment and Plan: Primary insomnia - Plan: traZODone (DESYREL) 50 MG tablet  Uterine prolapse  Long standing insomnia Will have her try trazodone for sleep- she will let me know if not helpful. She has been resting in bed the last 3 nights and I suspect she has indeed been sleeping at least some She will contact her OBG about her uterine prolapse- she plans to see her soon and does not have any other questions about this currently    Signed Abbe Amsterdam, MD

## 2016-09-22 NOTE — Patient Instructions (Signed)
Let's have you try trazodone at bedtime as needed for insomnia. If this does not help please let me know!   Please schedule a visit with Dr. Ernestina Penna about your uterine prolapse.   Take care!

## 2016-09-24 ENCOUNTER — Encounter (HOSPITAL_COMMUNITY): Payer: Self-pay

## 2016-09-24 ENCOUNTER — Inpatient Hospital Stay (HOSPITAL_COMMUNITY)
Admission: AD | Admit: 2016-09-24 | Discharge: 2016-09-24 | Disposition: A | Discharge status: Home / Self Care | Payer: BC Managed Care – PPO | Source: Ambulatory Visit | Attending: Obstetrics | Admitting: Obstetrics

## 2016-09-24 DIAGNOSIS — R51 Headache: Secondary | ICD-10-CM | POA: Insufficient documentation

## 2016-09-24 DIAGNOSIS — N814 Uterovaginal prolapse, unspecified: Secondary | ICD-10-CM | POA: Insufficient documentation

## 2016-09-24 DIAGNOSIS — R102 Pelvic and perineal pain: Secondary | ICD-10-CM | POA: Diagnosis present

## 2016-09-24 DIAGNOSIS — R519 Headache, unspecified: Secondary | ICD-10-CM

## 2016-09-24 HISTORY — DX: Headache, unspecified: R51.9

## 2016-09-24 HISTORY — DX: Headache: R51

## 2016-09-24 LAB — WET PREP, GENITAL
Clue Cells Wet Prep HPF POC: NONE SEEN
Sperm: NONE SEEN
Trich, Wet Prep: NONE SEEN
YEAST WET PREP: NONE SEEN

## 2016-09-24 MED ORDER — IBUPROFEN 800 MG PO TABS: 800.0000 mg | ORAL_TABLET | Freq: Three times a day (TID) | ORAL | 0 refills | Status: DC | PRN

## 2016-09-24 MED ORDER — IBUPROFEN 800 MG PO TABS
800.0000 mg | ORAL_TABLET | Freq: Once | ORAL | Status: AC
  Administered 2016-09-24: 800 mg via ORAL
  Filled 2016-09-24: qty 1

## 2016-09-24 NOTE — MAU Note (Signed)
Pt reports she was diagnosed with a prolapsed uterus on Thursday and was referred to dr Ernestina Penna, however it is worse today.

## 2016-09-24 NOTE — MAU Provider Note (Signed)
History     CSN: [id number]  Arrival date and time: 09/24/16 1059   First Provider Initiated Contact with Patient 09/24/16 1131      Chief Complaint  Patient presents with  . Pelvic Pain   HPI  Ms. Heidi [name] is a 66 y.o. [id number] non-pregnant female presenting to MAU with complaints of "uterine prolapse that is worsening". She is feeling "lots of pelvic pressure". She has c/o of this same problem for 3-4 months.  She was seen by her PCP for it.  Her PCP told her she needed to be seen by her GYN provider - Dr. Ernestina Penna.  She has not called [facility] yet to be evaluated for uterine prolapse.  The patient states "it just comes all the way out when I am lying down".  She is still sexually active; "not frequently".  Her last SI was unprotected 2 wks ago. She also has a complaint of a persistent H/A since this AM.  She is requesting something to her headache.  Past Medical History:  Diagnosis Date  . Allergic rhinitis due to pollen   . Allergy   . Anemia, unspecified   . Blood transfusion without reported diagnosis    1979  . Disorder of bone and cartilage, unspecified   . Eosinophilia   . Lymphocytosis (symptomatic)   . Osteoporosis   . Other abnormal blood chemistry   . Other and unspecified hyperlipidemia   . Pain in joint, shoulder region   . Reflux esophagitis   . Type II or unspecified type diabetes mellitus without mention of complication, uncontrolled   . Umbilical hernia without mention of obstruction or gangrene   . Unspecified disorder of liver     Past Surgical History:  Procedure Laterality Date  . CESAREAN SECTION    . COLONOSCOPY  2015   Dr. Arlyce Dice, normal exam    Family History  Problem Relation Age of Onset  . Pancreatic cancer Neg Hx   . Colon cancer Neg Hx   . Esophageal cancer Neg Hx   . Stomach cancer Neg Hx     Social History  Substance Use Topics  . Smoking status: Never Smoker  . Smokeless tobacco: Never Used  . Alcohol use No    Allergies:  No Known Allergies  Prescriptions Prior to Admission  Medication Sig Dispense Refill Last Dose  . calcium-vitamin D (OSCAL WITH D) 500-200 MG-UNIT per tablet Take 1 tablet by mouth daily.   09/23/2016 at Unknown time  . cetirizine (ZYRTEC) 10 MG tablet Take 10 mg by mouth daily.   09/23/2016 at Unknown time  . COD LIVER OIL PO Take 1 capsule by mouth daily.   09/23/2016 at Unknown time  . meloxicam (MOBIC) 7.5 MG tablet Take 1 tablet (7.5 mg total) by mouth daily. Use as needed for shoulder pain (Patient taking differently: Take 7.5 mg by mouth daily as needed for pain. Use as needed for shoulder pain) 30 tablet 0 Past Week at Unknown time  . Multiple Vitamin (MULTIVITAMIN) tablet Take 1 tablet by mouth daily.   09/23/2016 at Unknown time  . traZODone (DESYREL) 50 MG tablet Take 0.5-1 tablets (25-50 mg total) by mouth at bedtime as needed for sleep. 30 tablet 6 09/23/2016 at Unknown time  . XIIDRA 5 % SOLN Place 1 drop into both eyes daily.    09/24/2016 at Unknown time  . sucralfate (CARAFATE) 1 g tablet Take 1 tablet (1 g total) by mouth 4 (four) times daily -  with meals  and at bedtime. Use as needed (Patient not taking: Reported on 09/24/2016) 40 tablet 1 Not Taking at Unknown time    Review of Systems  Constitutional: Negative.   HENT: Negative.   Eyes: Negative.   Respiratory: Negative.   Cardiovascular: Negative.   Gastrointestinal: Negative.   Endocrine: Negative.   Genitourinary: Positive for pelvic pain (pressure). Negative for vaginal bleeding, vaginal discharge and vaginal pain.  Musculoskeletal: Negative.   Skin: Negative.   Allergic/Immunologic: Negative.   Neurological: Positive for headaches.  Hematological: Negative.   Psychiatric/Behavioral: Negative.    Physical Exam   Blood pressure 132/77, pulse 68, temperature 97.7 F (36.5 C), temperature source Oral, resp. rate 17, height  (1.549 m), weight 58.1 kg (128 lb), SpO2 99 %.  Physical Exam  Nursing note and vitals  reviewed. Constitutional: She is oriented to person, place, and time. She appears well-developed and well-nourished.  HENT:  Head: Normocephalic.  Eyes: Pupils are equal, round, and reactive to light.  Neck: Normal range of motion.  Cardiovascular: Normal rate, regular rhythm and normal heart sounds.   Respiratory: Effort normal and breath sounds normal.  GI: Soft. Bowel sounds are normal.  Genitourinary:  Genitourinary Comments: Uterus: small, non-tender, cx: smooth, pink, no lesions, os stenotic, scant amt of clear vaginal d/c, closed/firm, no CMT or friability, no adnexal tenderness; large stool felt through posterior wall of vagina - unable to manipulate stool with bimanual exam // patient up to bedside for Valsalva maneuver resulting in prolapse of cerivx down to introitus; no protrusion of cervix from introitus.  Able to easily move inward with two gloved fingers  Musculoskeletal: Normal range of motion.  Neurological: She is alert and oriented to person, place, and time.  Skin: Skin is warm and dry.  Psychiatric: She has a normal mood and affect. Her behavior is normal. Judgment and thought content normal.    MAU Course  Procedures  MDM CCUA Wet Prep GC/CT Ibuprofen 800 mg po -- improving H/A  *Consult with Dr. Ernestina Penna @ 1250 - notified of patient's complaints, assessments, lab results, recommended tx plan have pt stand at bedside and monitor for prolapse while she does a Valsava, if prolapse seen at that time have pt push the prolapse back up inside with 2 fingers; pt should call [facility] Monday to schedule appt - ok to d/c home  Results for orders placed or performed during the hospital encounter of 09/24/16 (from the past 24 hour(s))  Wet prep, genital     Status: Abnormal   Collection Time: 09/24/16 11:40 AM  Result Value Ref Range   Yeast Wet Prep HPF POC NONE SEEN NONE SEEN   Trich, Wet Prep NONE SEEN NONE SEEN   Clue Cells Wet Prep HPF POC NONE SEEN NONE SEEN   WBC, Wet  Prep HPF POC FEW (A) NONE SEEN   Sperm NONE SEEN     Assessment and Plan  Uterine prolapse - Uterine prolapse information given -  When your uterus prolapses out, push the prolapse back inside with 2 fingers - Call [facility] Monday to schedule appt  Acute nonintractable headache, unspecified headache type - Rx for Ibuprofen 800 mg every 8 hr prn pain  Discharge home Patient verbalized an understanding of the plan of care and agrees.   Raelyn Mora, MSN, CNM 09/24/2016, 11:48 AM

## 2016-09-24 NOTE — MAU Note (Signed)
Notified Dr. Ernestina Penna patient here for prolapsed uterus, MD requested patient be evaluated by [facility] provider in MAU.

## 2016-09-26 ENCOUNTER — Other Ambulatory Visit: Payer: Self-pay | Admitting: Obstetrics

## 2016-09-26 LAB — GC/CHLAMYDIA PROBE AMP (~~LOC~~) NOT AT ARMC
CHLAMYDIA, DNA PROBE: NEGATIVE
Neisseria Gonorrhea: NEGATIVE

## 2016-10-02 ENCOUNTER — Encounter: Payer: Self-pay | Admitting: Family Medicine

## 2016-10-14 ENCOUNTER — Encounter: Payer: Self-pay | Admitting: Family Medicine

## 2016-12-01 ENCOUNTER — Encounter: Payer: Self-pay | Admitting: Medical

## 2016-12-01 ENCOUNTER — Ambulatory Visit (INDEPENDENT_AMBULATORY_CARE_PROVIDER_SITE_OTHER): Payer: BC Managed Care – PPO | Admitting: Medical

## 2016-12-01 VITALS — BP 120/82 | HR 68 | Temp 97.7°F | Resp 16 | Wt 132.0 lb

## 2016-12-01 DIAGNOSIS — R52 Pain, unspecified: Secondary | ICD-10-CM | POA: Diagnosis not present

## 2016-12-01 DIAGNOSIS — J029 Acute pharyngitis, unspecified: Secondary | ICD-10-CM | POA: Diagnosis not present

## 2016-12-01 LAB — POCT RAPID STREP A (OFFICE): Rapid Strep A Screen: POSITIVE — AB

## 2016-12-01 LAB — POCT INFLUENZA A/B
INFLUENZA B, POC: NEGATIVE
Influenza A, POC: NEGATIVE

## 2016-12-01 MED ORDER — AMOXICILLIN 875 MG PO TABS: 875.0000 mg | ORAL_TABLET | Freq: Two times a day (BID) | ORAL | 0 refills | Status: DC

## 2016-12-01 NOTE — Patient Instructions (Addendum)
Your strep test was positive. I am prescribing  Amoxicillin antibiotic.   Rest hydrate, tylenol for fever and warm salt water gargles.   Symptoms should clear completely by 10 days. If any residual st could repeat rapid strep and even send out culture post treatment. Might ask kids in house if they have sore throats or fever. If so then have them evaluated and tested.   Follow up in 7 days or as needed.

## 2016-12-01 NOTE — Progress Notes (Signed)
Subjective:    Patient ID: Heidi [name], female    DOB: 06/04/1950, 66 y.o.   MRN: [id number]  HPI  Pt has sore throat for 2 days.  Associated symptom.  Body aches-yes Fever- no Chills-no HA-mild ha Neck symptoms-no Lymph node enlargement-mild Rash-no Painful swallowing-yes Recent strep contact-not definite but 4 children in house.     Review of Systems  Constitutional: Negative for chills, fatigue and fever.  HENT: Positive for sore throat. Negative for congestion, nosebleeds, sinus pressure and sinus pain.   Respiratory: Negative for cough, chest tightness, shortness of breath and wheezing.   Cardiovascular: Negative for chest pain and palpitations.  Gastrointestinal: Negative for abdominal pain.  Musculoskeletal: Positive for arthralgias and myalgias. Negative for neck pain and neck stiffness.  Skin: Negative for rash.  Neurological: Negative for dizziness and headaches.  Hematological: Positive for adenopathy.  Psychiatric/Behavioral: Negative for behavioral problems, confusion and suicidal ideas. The patient is not nervous/anxious.    Past Medical History:  Diagnosis Date  . Allergic rhinitis due to pollen   . Allergy   . Anemia, unspecified   . Blood transfusion without reported diagnosis    1979  . Disorder of bone and cartilage, unspecified   . Eosinophilia   . Headache 09/24/2016  . Lymphocytosis (symptomatic)   . Osteoporosis   . Other abnormal blood chemistry   . Other and unspecified hyperlipidemia   . Pain in joint, shoulder region   . Reflux esophagitis   . Type II or unspecified type diabetes mellitus without mention of complication, uncontrolled   . Umbilical hernia without mention of obstruction or gangrene   . Unspecified disorder of liver      Social History   Socioeconomic History  . Marital status: Married    Spouse name: Eleanora NeighborBampia  . Number of children: 4  . Years of education: BSN  . Highest education level: Not on file  Social  Needs  . Financial resource strain: Not on file  . Food insecurity - worry: Not on file  . Food insecurity - inability: Not on file  . Transportation needs - medical: Not on file  . Transportation needs - non-medical: Not on file  Occupational History  . Occupation: Teacher, adult educationN    Employer: [employer]  Tobacco Use  . Smoking status: Never Smoker  . Smokeless tobacco: Never Used  Substance and Sexual Activity  . Alcohol use: No  . Drug use: No  . Sexual activity: Yes    Partners: Male  Other Topics Concern  . Not on file  Social History Narrative   Married   Never smoked   Alcohol none   Exercise walk 2-3 times week             Past Surgical History:  Procedure Laterality Date  . CESAREAN SECTION    . COLONOSCOPY  2015   Dr. Arlyce DiceKaplan, normal exam    Family History  Problem Relation Age of Onset  . Pancreatic cancer Neg Hx   . Colon cancer Neg Hx   . Esophageal cancer Neg Hx   . Stomach cancer Neg Hx     No Known Allergies  Current Outpatient Medications on File Prior to Visit  Medication Sig Dispense Refill  . calcium-vitamin D (OSCAL WITH D) 500-200 MG-UNIT per tablet Take 1 tablet by mouth daily.    . cetirizine (ZYRTEC) 10 MG tablet Take 10 mg by mouth daily.    . COD LIVER OIL PO Take 1 capsule by  mouth daily.    Marland Kitchen. ibuprofen (ADVIL,MOTRIN) 800 MG tablet Take 1 tablet (800 mg total) by mouth every 8 (eight) hours as needed for moderate pain. 21 tablet 0  . meloxicam (MOBIC) 7.5 MG tablet Take 1 tablet (7.5 mg total) by mouth daily. Use as needed for shoulder pain (Patient taking differently: Take 7.5 mg by mouth daily as needed for pain. Use as needed for shoulder pain) 30 tablet 0  . Multiple Vitamin (MULTIVITAMIN) tablet Take 1 tablet by mouth daily.    . traZODone (DESYREL) 50 MG tablet Take 0.5-1 tablets (25-50 mg total) by mouth at bedtime as needed for sleep. 30 tablet 6  . XIIDRA 5 % SOLN Place 1 drop into both eyes daily.      No current  facility-administered medications on file prior to visit.     BP 120/82   Pulse 68   Temp 97.7 F (36.5 C) (Oral)   Resp 16   Wt 132 lb (59.9 kg)   SpO2 97%   BMI 24.94 kg/m       Objective:   Physical Exam   General  Mental Status - Alert. General Appearance - Well groomed. Not in acute distress.  Skin Rashes- No Rashes.  HEENT Head- Normal. Ear Auditory Canal - Left- Normal. Right - Normal.Tympanic Membrane- Left- Normal. Right- Normal. Eye Sclera/Conjunctiva- Left- Normal. Right- Normal. Nose & Sinuses Nasal Mucosa- Left-  Not  Boggy and Congested. Right-  Not  Boggy and  Congested.Bilateral maxillary and frontal sinus pressure. Mouth & Throat Lips: Upper Lip- Normal: no dryness, cracking, pallor, cyanosis, or vesicular eruption. Lower Lip-Normal: no dryness, cracking, pallor, cyanosis or vesicular eruption. Buccal Mucosa- Bilateral- No Aphthous ulcers. Oropharynx- No Discharge or Erythema. Tonsils: Characteristics- Bilateral- moderate  Erythema or Congestion. Size/Enlargement-  Bilateral 1+- No enlargement. Discharge- bilateral-None.  Neck Neck- Supple. No Masses. Enlarged submandibular nodes.   Chest and Lung Exam Auscultation: Breath Sounds:-Clear even and unlabored.  Cardiovascular Auscultation:Rythm- Regular, rate and rhythm. Murmurs & Other Heart Sounds:Ausculatation of the heart reveal- No Murmurs.  Lymphatic Head & Neck General Head & Neck Lymphatics: Bilateral: Description- see neck exam      Assessment & Plan:  Your strep test was positive. I am prescribing  Amoxicillin antibiotic.   Rest hydrate, tylenol for fever and warm salt water gargles.   Symptoms should clear completely by 10 days. If any residual st could repeat rapid strep and even send out culture post treatment. Might ask kids in house if they have sorethroats or fever. If so then have them evaluated and tested.   Follow up in 7 days or as needed.    Taiz Bickle, Ramon DredgeEdward, PA-C

## 2016-12-15 ENCOUNTER — Encounter: Payer: Self-pay | Admitting: Medical

## 2016-12-15 ENCOUNTER — Telehealth: Payer: Self-pay | Admitting: Family Medicine

## 2016-12-15 ENCOUNTER — Ambulatory Visit (INDEPENDENT_AMBULATORY_CARE_PROVIDER_SITE_OTHER): Payer: BC Managed Care – PPO | Admitting: Medical

## 2016-12-15 VITALS — BP 119/68 | HR 67 | Temp 97.8°F | Resp 16 | Wt 130.8 lb

## 2016-12-15 DIAGNOSIS — J029 Acute pharyngitis, unspecified: Secondary | ICD-10-CM

## 2016-12-15 DIAGNOSIS — J020 Streptococcal pharyngitis: Secondary | ICD-10-CM

## 2016-12-15 MED ORDER — CLINDAMYCIN HCL 150 MG PO CAPS: 150.0000 mg | ORAL_CAPSULE | Freq: Three times a day (TID) | ORAL | 0 refills | Status: DC

## 2016-12-15 MED ORDER — PENICILLIN V POTASSIUM 500 MG PO TABS: 500.0000 mg | ORAL_TABLET | Freq: Two times a day (BID) | ORAL | 0 refills | Status: DC

## 2016-12-15 NOTE — Progress Notes (Signed)
Subjective:    Patient ID: Heidi [name], female    DOB: 05/25/1950, 66 y.o.   MRN: [id number]  HPI  Pt in for follow up. She states feels better from sore throat. Some faint st on swallowing and mild redness. No bodyaches, no chills, no fever and no fatigue   Pt did take full course of antibiotic for 10 days.  But her symptoms are to some degree still present.  At time pt was diagnosed she did have 2 of her grandchildren who had strep. She watches them. Both of them were treated for strep with antibiotic. They are feeling fine.   Review of Systems  Constitutional: Negative for chills, fatigue and fever.  HENT: Positive for sore throat. Negative for congestion, ear pain, sinus pressure, sinus pain, tinnitus and trouble swallowing.        Faint st.  Respiratory: Negative for cough, chest tightness, shortness of breath and wheezing.   Cardiovascular: Negative for chest pain and palpitations.  Gastrointestinal: Negative for abdominal pain.  Musculoskeletal: Negative for arthralgias, back pain and myalgias.  Skin: Negative for rash.  Neurological: Negative for dizziness, light-headedness and headaches.  Hematological: Positive for adenopathy. Does not bruise/bleed easily.  Psychiatric/Behavioral: Negative for behavioral problems.   Past Medical History:  Diagnosis Date  . Allergic rhinitis due to pollen   . Allergy   . Anemia, unspecified   . Blood transfusion without reported diagnosis    1979  . Disorder of bone and cartilage, unspecified   . Eosinophilia   . Headache 09/24/2016  . Lymphocytosis (symptomatic)   . Osteoporosis   . Other abnormal blood chemistry   . Other and unspecified hyperlipidemia   . Pain in joint, shoulder region   . Reflux esophagitis   . Type II or unspecified type diabetes mellitus without mention of complication, uncontrolled   . Umbilical hernia without mention of obstruction or gangrene   . Unspecified disorder of liver      Social History    Socioeconomic History  . Marital status: Married    Spouse name: Eleanora NeighborBampia  . Number of children: 4  . Years of education: BSN  . Highest education level: Not on file  Social Needs  . Financial resource strain: Not on file  . Food insecurity - worry: Not on file  . Food insecurity - inability: Not on file  . Transportation needs - medical: Not on file  . Transportation needs - non-medical: Not on file  Occupational History  . Occupation: Teacher, adult educationN    Employer: [employer]  Tobacco Use  . Smoking status: Never Smoker  . Smokeless tobacco: Never Used  Substance and Sexual Activity  . Alcohol use: No  . Drug use: No  . Sexual activity: Yes    Partners: Male  Other Topics Concern  . Not on file  Social History Narrative   Married   Never smoked   Alcohol none   Exercise walk 2-3 times week             Past Surgical History:  Procedure Laterality Date  . CESAREAN SECTION    . COLONOSCOPY  2015   Dr. Arlyce DiceKaplan, normal exam    Family History  Problem Relation Age of Onset  . Pancreatic cancer Neg Hx   . Colon cancer Neg Hx   . Esophageal cancer Neg Hx   . Stomach cancer Neg Hx     No Known Allergies  Current Outpatient Medications on File Prior to Visit  Medication Sig Dispense Refill  . calcium-vitamin D (OSCAL WITH D) 500-200 MG-UNIT per tablet Take 1 tablet by mouth daily.    . cetirizine (ZYRTEC) 10 MG tablet Take 10 mg by mouth daily.    . COD LIVER OIL PO Take 1 capsule by mouth daily.    Marland Kitchen. ibuprofen (ADVIL,MOTRIN) 800 MG tablet Take 1 tablet (800 mg total) by mouth every 8 (eight) hours as needed for moderate pain. 21 tablet 0  . meloxicam (MOBIC) 7.5 MG tablet Take 1 tablet (7.5 mg total) by mouth daily. Use as needed for shoulder pain (Patient taking differently: Take 7.5 mg by mouth daily as needed for pain. Use as needed for shoulder pain) 30 tablet 0  . Multiple Vitamin (MULTIVITAMIN) tablet Take 1 tablet by mouth daily.    . traZODone (DESYREL) 50  MG tablet Take 0.5-1 tablets (25-50 mg total) by mouth at bedtime as needed for sleep. 30 tablet 6  . XIIDRA 5 % SOLN Place 1 drop into both eyes daily.      No current facility-administered medications on file prior to visit.     BP 119/68   Pulse 67   Temp 97.8 F (36.6 C) (Oral)   Resp 16   Wt 130 lb 12.8 oz (59.3 kg)   SpO2 99%   BMI 24.71 kg/m        Objective:   Physical Exam  General  Mental Status - Alert. General Appearance - Well groomed. Not in acute distress.  Skin Rashes- No Rashes.  HEENT Head- Normal. Ear Auditory Canal - Left- Normal. Right - Normal.Tympanic Membrane- Left- Normal. Right- Normal. Eye Sclera/Conjunctiva- Left- Normal. Right- Normal. Nose & Sinuses Nasal Mucosa- Left-  Boggy and Congested. Right-  Boggy and  Congested.Bilateral no maxillary and no frontal sinus pressure. Mouth & Throat Lips: Upper Lip- Normal: no dryness, cracking, pallor, cyanosis, or vesicular eruption. Lower Lip-Normal: no dryness, cracking, pallor, cyanosis or vesicular eruption. Buccal Mucosa- Bilateral- No Aphthous ulcers. Oropharynx- No Discharge or Erythema. Tonsils: Characteristics- Bilateral- Mild  Erythema or Congestion. Size/Enlargement- Bilateral- No enlargement. Discharge- bilateral-None.  Neck Neck- Supple. No Masses.   Chest and Lung Exam Auscultation: Breath Sounds:-Clear even and unlabored.  Cardiovascular Auscultation:Rythm- Regular, rate and rhythm. Murmurs & Other Heart Sounds:Ausculatation of the heart reveal- No Murmurs.  Lymphatic Head & Neck General Head & Neck Lymphatics: Bilateral: Description- Mild swelling of left submandibular lymph node.       Assessment & Plan:  Your rapid strep test was positive.  This is unusual after a course of amoxicillin.  It has been about 2 weeks so it is possible you had re-exposure or this could represent lingering prior infection.  Overall your symptomatically much improved.  At this time I will  give you clindamycin antibiotic.  I do want you to take the full course and follow-up for recheck of your throat.  At that time we will do rapid strep test and if that test is negative will make sure send out negative as well.    Follow-up in 10 days or as needed.  Information regarding probiotics was given.  At the very end we did have discussion about her history of C. difficile in the past.  In addition to eating foods with probiotics, I did ask her to get probiotic tablets over-the-counter.  If she has any loose watery stools I asked her to notify me and to stop the antibiotic at that point until further advisement.  Patient expressed understanding  Tarynn Garling,  Percell Miller, PA-C

## 2016-12-15 NOTE — Telephone Encounter (Signed)
Called pt- she was treated for strep about 2 weeks ago with amoxicillin however she came in today and her strep was positive again We are not sure if this is resistance or a re-infection.  However she is worried about taking clindamycin as it did give her C diff a few years ago. I agree that we will avoid this medication if possible Will try penicillin for her instead-  Will call this in, and she will see me in about 2 weeks to recheck her throat

## 2016-12-15 NOTE — Telephone Encounter (Signed)
Copied from CRM [id number]. Topic: Inquiry >> Dec 15, 2016  3:29 PM Alexander [name], Heidi B wrote: Reason for CRM: pt's daughter called b/c she wanted to know if the antibiotic could be changed b/c the clindamycin she was given shes had c-diff before from it, contact daughter to advise

## 2016-12-15 NOTE — Patient Instructions (Addendum)
Your rapid strep test was positive.  This is unusual after a course of amoxicillin.  It has been about 2 weeks so it is possible you had re-exposure or this could represent lingering prior infection.  Overall your symptomatically much improved.  At this time I will give you clindamycin antibiotic.  I do want you to take the full course and follow-up for recheck of your throat.  At that time we will do rapid strep test and if that test is negative will make sure send out negative as well.    Follow-up in 10 days or as needed.   Please make sure you are eating probiotic rich foods or you could also get probiotics over-the-counter.  Would recommend this while taking antibiotic.  Probiotics What are probiotics? Probiotics are the good bacteria and yeasts that live in your body and keep you and your digestive system healthy. Probiotics also help your body's defense (immune) system and protect your body against bad bacterial growth. Certain foods contain probiotics, such as yogurt. Probiotics can also be purchased as a supplement. As with any supplement or drug, it is important to discuss its use with your health care provider. What affects the balance of bacteria in my body? The balance of bacteria in your body can be affected by:  Antibiotic medicines. Antibiotics are sometimes necessary to treat infection. Unfortunately, they may kill good or friendly bacteria in your body as well as the bad bacteria. This may lead to stomach problems like diarrhea, gas, and cramping.  Disease. Some conditions are the result of an overgrowth of bad bacteria, yeasts, parasites, or fungi. These conditions include: ? Infectious diarrhea. ? Stomach and respiratory infections. ? Skin infections. ? Irritable bowel syndrome (IBS). ? Inflammatory bowel diseases. ? Ulcer due to Helicobacter pylori (H. pylori) infection. ? Tooth decay and periodontal disease. ? Vaginal infections.  Stress and poor diet may also lower  the good bacteria in your body. What type of probiotic is right for me? Probiotics are available over the counter at your local pharmacy, health food, or grocery store. They come in many different forms, combinations of strains, and dosing strengths. Some may need to be refrigerated. Always read the label for storage and usage instructions. Specific strains have been shown to be more effective for certain conditions. Ask your health care provider what option is best for you. Why would I need probiotics? There are many reasons your health care provider might recommend a probiotic supplement, including:  Diarrhea.  Constipation.  IBS.  Respiratory infections.  Yeast infections.  Acne, eczema, and other skin conditions.  Frequent urinary tract infections (UTIs).  Are there side effects of probiotics? Some people experience mild side effects when taking probiotics. Side effects are usually temporary and may include:  Gas.  Bloating.  Cramping.  Rarely, serious side effects, such as infection or immune system changes, may occur. What else do I need to know about probiotics?  There are many different strains of probiotics. Certain strains may be more effective depending on your condition. Probiotics are available in varying doses. Ask your health care provider which probiotic you should use and how often.  If you are taking probiotics along with antibiotics, it is generally recommended to wait at least 2 hours between taking the antibiotic and taking the probiotic. For more information: [facility] http://potts.com/http://nccam.nih.gov/ This information is not intended to replace advice given to you by your health care provider. Make sure you discuss any questions  you have with your health care provider. Document Released: 07/17/2013 Document Revised: 11/17/2015 Document Reviewed: 03/19/2013 Elsevier Interactive Patient Education  2017 [employer]

## 2016-12-30 ENCOUNTER — Telehealth: Payer: Self-pay | Admitting: Medical

## 2016-12-30 ENCOUNTER — Encounter: Payer: Self-pay | Admitting: Medical

## 2016-12-30 ENCOUNTER — Ambulatory Visit (INDEPENDENT_AMBULATORY_CARE_PROVIDER_SITE_OTHER): Payer: BC Managed Care – PPO | Admitting: Medical

## 2016-12-30 VITALS — BP 121/63 | HR 66 | Temp 98.0°F | Resp 16 | Ht 61.0 in | Wt 132.0 lb

## 2016-12-30 DIAGNOSIS — J020 Streptococcal pharyngitis: Secondary | ICD-10-CM | POA: Diagnosis not present

## 2016-12-30 DIAGNOSIS — J029 Acute pharyngitis, unspecified: Secondary | ICD-10-CM

## 2016-12-30 LAB — POCT RAPID STREP A (OFFICE): Rapid Strep A Screen: POSITIVE — AB

## 2016-12-30 MED ORDER — CEFDINIR 300 MG PO CAPS: 300.0000 mg | ORAL_CAPSULE | Freq: Two times a day (BID) | ORAL | 0 refills | Status: DC

## 2016-12-30 NOTE — Patient Instructions (Addendum)
Most recently treated for strep with concern for recurrent infection vs resistance.(I think stronger antibiotic needed in light of persisting infection post veetids)  Since you have history of c dif in past I am cautious  in having to write stronger antibiotic and I do think clindamycin would be antibiotic of choice. You describe not wanting to be on this antibiotic so I will send note to your pcp to see which antibiotic she would prefer.  I will update you when I get word   Follow up in 7-10 days or as needed

## 2016-12-30 NOTE — Progress Notes (Signed)
Subjective:    Patient ID: Heidi [name], female    DOB: 08/11/1950, 66 y.o.   MRN: [id number]  HPI  Pt in 5 days after finishing amoxicillin. I had wanted to try clindamycin but pt was worried about side effects.  So pt took veetids twice daily for full course after expressing to her pcp concern regarding pt c dificile history in past.   Pt felt like her throat pain never went away completely.   No fever, no chills or body aches.   Pt when she swallows has level 4/10 pain.  Note this is her third visit with + strep. First was on amoxicillin. Then took veetids.  Review of Systems  Constitutional: Negative for chills, fatigue and fever.  HENT: Positive for sore throat. Negative for congestion, sinus pressure, sinus pain, tinnitus and trouble swallowing.        Submandibular nodes mild swollen.  Respiratory: Negative for cough, chest tightness, shortness of breath and wheezing.   Cardiovascular: Negative for chest pain.  Gastrointestinal: Negative for abdominal pain, constipation, diarrhea and nausea.  Musculoskeletal: Negative for back pain, gait problem, myalgias and neck pain.  Skin: Negative for rash.  Psychiatric/Behavioral: Negative for behavioral problems and confusion.   Past Medical History:  Diagnosis Date  . Allergic rhinitis due to pollen   . Allergy   . Anemia, unspecified   . Blood transfusion without reported diagnosis    1979  . Disorder of bone and cartilage, unspecified   . Eosinophilia   . Headache 09/24/2016  . Lymphocytosis (symptomatic)   . Osteoporosis   . Other abnormal blood chemistry   . Other and unspecified hyperlipidemia   . Pain in joint, shoulder region   . Reflux esophagitis   . Type II or unspecified type diabetes mellitus without mention of complication, uncontrolled   . Umbilical hernia without mention of obstruction or gangrene   . Unspecified disorder of liver      Social History   Socioeconomic History  . Marital status:  Married    Spouse name: Eleanora NeighborBampia  . Number of children: 4  . Years of education: BSN  . Highest education level: Not on file  Social Needs  . Financial resource strain: Not on file  . Food insecurity - worry: Not on file  . Food insecurity - inability: Not on file  . Transportation needs - medical: Not on file  . Transportation needs - non-medical: Not on file  Occupational History  . Occupation: Teacher, adult educationN    Employer: [employer]  Tobacco Use  . Smoking status: Never Smoker  . Smokeless tobacco: Never Used  Substance and Sexual Activity  . Alcohol use: No  . Drug use: No  . Sexual activity: Yes    Partners: Male  Other Topics Concern  . Not on file  Social History Narrative   Married   Never smoked   Alcohol none   Exercise walk 2-3 times week             Past Surgical History:  Procedure Laterality Date  . CESAREAN SECTION    . COLONOSCOPY  2015   Dr. Arlyce DiceKaplan, normal exam    Family History  Problem Relation Age of Onset  . Pancreatic cancer Neg Hx   . Colon cancer Neg Hx   . Esophageal cancer Neg Hx   . Stomach cancer Neg Hx     No Known Allergies  Current Outpatient Medications on File Prior to Visit  Medication Sig Dispense  Refill  . calcium-vitamin D (OSCAL WITH D) 500-200 MG-UNIT per tablet Take 1 tablet by mouth daily.    . cetirizine (ZYRTEC) 10 MG tablet Take 10 mg by mouth daily.    . COD LIVER OIL PO Take 1 capsule by mouth daily.    Marland Kitchen. ibuprofen (ADVIL,MOTRIN) 800 MG tablet Take 1 tablet (800 mg total) by mouth every 8 (eight) hours as needed for moderate pain. 21 tablet 0  . meloxicam (MOBIC) 7.5 MG tablet Take 1 tablet (7.5 mg total) by mouth daily. Use as needed for shoulder pain (Patient taking differently: Take 7.5 mg by mouth daily as needed for pain. Use as needed for shoulder pain) 30 tablet 0  . Multiple Vitamin (MULTIVITAMIN) tablet Take 1 tablet by mouth daily.    . penicillin v potassium (VEETID) 500 MG tablet Take 1 tablet (500 mg  total) by mouth 2 (two) times daily. 20 tablet 0  . traZODone (DESYREL) 50 MG tablet Take 0.5-1 tablets (25-50 mg total) by mouth at bedtime as needed for sleep. 30 tablet 6  . XIIDRA 5 % SOLN Place 1 drop into both eyes daily.      No current facility-administered medications on file prior to visit.     BP 121/63 (BP Location: Right Arm, Patient Position: Sitting, Cuff Size: Small)   Pulse 66   Temp 98 F (36.7 C) (Oral)   Resp 16   Ht 5\' 1"  (1.549 m)   Wt 132 lb (59.9 kg)   SpO2 100%   BMI 24.94 kg/m       Objective:   Physical Exam  General  Mental Status - Alert. General Appearance - Well groomed. Not in acute distress.  Skin Rashes- No Rashes.  HEENT Head- Normal. Ear Auditory Canal - Left- Normal. Right - Normal.Tympanic Membrane- Left- Normal. Right- Normal. Eye Sclera/Conjunctiva- Left- Normal. Right- Normal. Nose & Sinuses Nasal Mucosa- Left-  Boggy and Congested. Right-  Boggy and  Congested.Bilateral faint  maxillary and  Faint  frontal sinus pressure. Mouth & Throat Lips: Upper Lip- Normal: no dryness, cracking, pallor, cyanosis, or vesicular eruption. Lower Lip-Normal: no dryness, cracking, pallor, cyanosis or vesicular eruption. Buccal Mucosa- Bilateral- No Aphthous ulcers. Oropharynx- No Discharge or Erythema. Tonsils: Characteristics- Bilateral- moderate Erythema or Congestion. Size/Enlargement- Bilateral- No enlargement. Discharge- bilateral-None.  Neck Neck- Supple. No Masses.   Chest and Lung Exam Auscultation: Breath Sounds:-Clear even and unlabored.  Cardiovascular Auscultation:Rythm- Regular, rate and rhythm. Murmurs & Other Heart Sounds:Ausculatation of the heart reveal- No Murmurs.  Lymphatic Head & Neck General Head & Neck Lymphatics: Bilateral: Description- mild-moderate submandibular nodes swollen.       Assessment & Plan:  Most recently treated for strep with concern for recurrent infection vs resistance.(I think stronger  antibiotic needed in light of persisting infection)  Since you have history of c dif in past I am cautious having to write stronger antibiotic and I do think clindamycin would be antibiotic of choice. You describe not wanting to be on this antibiotic so I will send note to your pcp to see which antibiotic she would prefer.  I will update you when I get word   Follow up in 7-10 days or as needed

## 2016-12-30 NOTE — Telephone Encounter (Signed)
I did call  patient and explained to her that we prescribe Cefdnir antibiotic.  Discussed this with Dr. Patsy Lageropland.  Also explained to pt that we would do a send out throat culture and if test comes  back positive for strep  then we can call the lab and have them do sensitivity studies.  Our lab to investigate this for me.  Unfortunately I was very busy today and had the opportunity to call her back at 430.  She lives near [place] and I was thinking she might not make it in time before we closed.  So I explained to her to come in on Monday asked to speak to myself.  Then I could get the medical assistant working with me today do a send out throat culture.

## 2017-01-02 ENCOUNTER — Other Ambulatory Visit: Payer: BC Managed Care – PPO

## 2017-01-02 DIAGNOSIS — J029 Acute pharyngitis, unspecified: Secondary | ICD-10-CM

## 2017-01-02 NOTE — Addendum Note (Signed)
Addended by: Orlene OchRENCE, Norvil Martensen N on: 01/02/2017 09:33 AM   Modules accepted: Orders

## 2017-01-04 LAB — CULTURE, GROUP A STREP
MICRO NUMBER:: 81463642
SPECIMEN QUALITY: ADEQUATE

## 2017-01-16 ENCOUNTER — Ambulatory Visit (INDEPENDENT_AMBULATORY_CARE_PROVIDER_SITE_OTHER): Payer: BC Managed Care – PPO | Admitting: Medical

## 2017-01-16 ENCOUNTER — Encounter: Payer: Self-pay | Admitting: Medical

## 2017-01-16 VITALS — BP 123/83 | HR 73 | Temp 97.7°F | Resp 16 | Ht 61.0 in | Wt 130.4 lb

## 2017-01-16 DIAGNOSIS — Z8709 Personal history of other diseases of the respiratory system: Secondary | ICD-10-CM

## 2017-01-16 DIAGNOSIS — R591 Generalized enlarged lymph nodes: Secondary | ICD-10-CM

## 2017-01-16 LAB — CBC WITH DIFFERENTIAL/PLATELET
Basophils Absolute: 0.1 10*3/uL (ref 0.0–0.1)
Basophils Relative: 2.1 % (ref 0.0–3.0)
EOS PCT: 3.2 % (ref 0.0–5.0)
Eosinophils Absolute: 0.2 10*3/uL (ref 0.0–0.7)
HCT: 36.7 % (ref 36.0–46.0)
HEMOGLOBIN: 12.1 g/dL (ref 12.0–15.0)
LYMPHS ABS: 3.3 10*3/uL (ref 0.7–4.0)
Lymphocytes Relative: 67 % — ABNORMAL HIGH (ref 12.0–46.0)
MCHC: 32.9 g/dL (ref 30.0–36.0)
MCV: 87 fl (ref 78.0–100.0)
MONO ABS: 0.4 10*3/uL (ref 0.1–1.0)
Monocytes Relative: 7.8 % (ref 3.0–12.0)
NEUTROS PCT: 19.9 % — AB (ref 43.0–77.0)
Neutro Abs: 1 10*3/uL — ABNORMAL LOW (ref 1.4–7.7)
Platelets: 233 10*3/uL (ref 150.0–400.0)
RBC: 4.22 Mil/uL (ref 3.87–5.11)
RDW: 13.4 % (ref 11.5–15.5)
WBC: 4.9 10*3/uL (ref 4.0–10.5)

## 2017-01-16 NOTE — Patient Instructions (Signed)
Your recent strep test on the 31st came back negative.  This was shortly after starting on Cefdnir started.  You also report feeling well with no pain swallowing.  You only describe feeling that the lymph nodes are still swollen but not as much as previously.  The lymph node swelling is likely due to to the persistent/or recurrent strep infections recently.  I do think it would be a good idea to go ahead and do a CBC today to look at infection fighting cell count and to go ahead and refer you to ENT but that would not be stat referral as an appointment within a month or so would allow some time for lymph nodes swelling to decrease in size if your swelling associated with recent strep infection.  Follow-up as needed.

## 2017-01-16 NOTE — Progress Notes (Signed)
Subjective:    Patient ID: Heidi [name], female    DOB: 03/31/1950, 67 y.o.   MRN: [id number]  HPI  Pt in for follow up.  Pt throat culture came back negative. Pt did start omnicef.  Pt throat culture came back negative on 01-02-2017. She had perssisting strep positive despite treatment. And some st symptoms since late November. But now feeling overall better. See ROS described below.  Pt did not want to use clindamycin. So on last visit follow up  cefdnir written. Now only has slight swelling submandibular area like something in that region.    Review of Systems  Constitutional: Negative for chills, fatigue and fever.  HENT: Negative for congestion, ear discharge, ear pain, nosebleeds, sinus pressure, sinus pain, sneezing and trouble swallowing.   Respiratory: Negative for cough, chest tightness, shortness of breath and wheezing.   Cardiovascular: Negative for chest pain and palpitations.  Gastrointestinal: Negative for abdominal pain, blood in stool, constipation, diarrhea, nausea and vomiting.  Neurological: Negative for dizziness, speech difficulty, weakness, numbness and headaches.  Hematological: Positive for adenopathy. Does not bruise/bleed easily.  Psychiatric/Behavioral: Negative for behavioral problems and confusion.    Past Medical History:  Diagnosis Date  . Allergic rhinitis due to pollen   . Allergy   . Anemia, unspecified   . Blood transfusion without reported diagnosis    1979  . Disorder of bone and cartilage, unspecified   . Eosinophilia   . Headache 09/24/2016  . Lymphocytosis (symptomatic)   . Osteoporosis   . Other abnormal blood chemistry   . Other and unspecified hyperlipidemia   . Pain in joint, shoulder region   . Reflux esophagitis   . Type II or unspecified type diabetes mellitus without mention of complication, uncontrolled   . Umbilical hernia without mention of obstruction or gangrene   . Unspecified disorder of liver      Social  History   Socioeconomic History  . Marital status: Married    Spouse name: Eleanora NeighborBampia  . Number of children: 4  . Years of education: BSN  . Highest education level: Not on file  Social Needs  . Financial resource strain: Not on file  . Food insecurity - worry: Not on file  . Food insecurity - inability: Not on file  . Transportation needs - medical: Not on file  . Transportation needs - non-medical: Not on file  Occupational History  . Occupation: Teacher, adult educationN    Employer: [employer]  Tobacco Use  . Smoking status: Never Smoker  . Smokeless tobacco: Never Used  Substance and Sexual Activity  . Alcohol use: No  . Drug use: No  . Sexual activity: Yes    Partners: Male  Other Topics Concern  . Not on file  Social History Narrative   Married   Never smoked   Alcohol none   Exercise walk 2-3 times week             Past Surgical History:  Procedure Laterality Date  . CESAREAN SECTION    . COLONOSCOPY  2015   Dr. Arlyce DiceKaplan, normal exam    Family History  Problem Relation Age of Onset  . Pancreatic cancer Neg Hx   . Colon cancer Neg Hx   . Esophageal cancer Neg Hx   . Stomach cancer Neg Hx     No Known Allergies  Current Outpatient Medications on File Prior to Visit  Medication Sig Dispense Refill  . calcium-vitamin D (OSCAL WITH D) 500-200 MG-UNIT per  tablet Take 1 tablet by mouth daily.    . cefdinir (OMNICEF) 300 MG capsule Take 1 capsule (300 mg total) by mouth 2 (two) times daily. 20 capsule 0  . cetirizine (ZYRTEC) 10 MG tablet Take 10 mg by mouth daily.    . COD LIVER OIL PO Take 1 capsule by mouth daily.    Marland Kitchen ibuprofen (ADVIL,MOTRIN) 800 MG tablet Take 1 tablet (800 mg total) by mouth every 8 (eight) hours as needed for moderate pain. 21 tablet 0  . meloxicam (MOBIC) 7.5 MG tablet Take 1 tablet (7.5 mg total) by mouth daily. Use as needed for shoulder pain (Patient taking differently: Take 7.5 mg by mouth daily as needed for pain. Use as needed for shoulder  pain) 30 tablet 0  . Multiple Vitamin (MULTIVITAMIN) tablet Take 1 tablet by mouth daily.    . penicillin v potassium (VEETID) 500 MG tablet Take 1 tablet (500 mg total) by mouth 2 (two) times daily. 20 tablet 0  . traZODone (DESYREL) 50 MG tablet Take 0.5-1 tablets (25-50 mg total) by mouth at bedtime as needed for sleep. 30 tablet 6  . XIIDRA 5 % SOLN Place 1 drop into both eyes daily.      No current facility-administered medications on file prior to visit.     BP 123/83   Pulse 73   Temp 97.7 F (36.5 C) (Oral)   Resp 16   Ht 5\' 1"  (1.549 m)   Wt 130 lb 6.4 oz (59.1 kg)   SpO2 100%   BMI 24.64 kg/m       Objective:   Physical Exam  General  Mental Status - Alert. General Appearance - Well groomed. Not in acute distress.  Skin Rashes- No Rashes.  HEENT Head- Normal. Ear Auditory Canal - Left- Normal. Right - Normal.Tympanic Membrane- Left- Normal. Right- Normal. Eye Sclera/Conjunctiva- Left- Normal. Right- Normal. Nose & Sinuses Nasal Mucosa- Left-  Boggy and Congested. Right-  Boggy and  Congested.Bilateral no  maxillary and  No frontal sinus pressure. Mouth & Throat Lips: Upper Lip- Normal: no dryness, cracking, pallor, cyanosis, or vesicular eruption. Lower Lip-Normal: no dryness, cracking, pallor, cyanosis or vesicular eruption. Buccal Mucosa- Bilateral- No Aphthous ulcers. Oropharynx- No Discharge or Erythema. Tonsils: Characteristics- Bilateral- No Erythema. Size/Enlargement- Bilateral- No enlargement. Discharge- bilateral-None.  Neck Neck- Supple. No Masses.   Chest and Lung Exam Auscultation: Breath Sounds:-Clear even and unlabored.  Cardiovascular Auscultation:Rythm- Regular, rate and rhythm. Murmurs & Other Heart Sounds:Ausculatation of the heart reveal- No Murmurs.  Lymphatic Head & Neck General Head & Neck Lymphatics: Bilateral: Description- moderate enlarged but less swollen that last visit. Left side more than right side.           Assessment & Plan:  Your recent strep test on the 31st came back negative.  This was shortly after starting on Cefdnir started.  You also report feeling well with no pain swallowing.  You only describe feeling that the lymph nodes are still swollen but not as much as previously.  The lymph node swelling is likely due to to the persistent/or recurrent strep infections recently.  I do think it would be a good idea to go ahead and do a CBC today to look at infection fighting cell count and to go ahead and refer you to ENT but that would not be stat referral as an appointment within a month or so would allow some time for lymph nodes swelling to decrease in size if your swelling associated  with recent strep infection.  Follow-up as needed.  Dhairya Corales, Ramon Dredge, PA-C

## 2017-01-18 ENCOUNTER — Telehealth: Payer: Self-pay

## 2017-01-18 NOTE — Telephone Encounter (Signed)
Talked to patient about results, she does not have ENT appointment yet. She will call for appointment with us one wk after ENT appointment. CBC mailed to patient.

## 2017-01-18 NOTE — Telephone Encounter (Signed)
-----   Message from Esperanza RichtersEdward Saguier, PA-C sent at 01/17/2017  6:31 PM EST ----- Total WBC infection fighting cells were not elevated.  No anemia found.  You did have some decrease in other infection fighting cells such as slight decrease of neutrophils and increase of lymphocytes.  I do want you to keep appointment with ENT and let them feel your lymph nodes)they may do further tests.  Also would ask you get a copy of your CBC report and take it to the ENT appointment.  When you find out your appointment date with ENT, I would like for you to make appointment with Dr. Patsy Lageropland or myself 1 week after ENT appointment.  Would reevaluate the lymph nodes in the neck at that time and also make sure ENT send note to our office. I do think node swelling associated with strep but will be cautious and follow ent evaluation.

## 2017-01-21 ENCOUNTER — Other Ambulatory Visit: Payer: Self-pay | Admitting: Otolaryngology

## 2017-01-21 DIAGNOSIS — R591 Generalized enlarged lymph nodes: Secondary | ICD-10-CM

## 2017-02-14 ENCOUNTER — Encounter: Payer: Self-pay | Admitting: Family Medicine

## 2017-02-14 ENCOUNTER — Encounter: Payer: Self-pay | Admitting: Medical

## 2017-03-01 ENCOUNTER — Other Ambulatory Visit: Payer: Medicare Other

## 2017-03-10 ENCOUNTER — Encounter: Payer: Self-pay | Admitting: Medical

## 2017-03-10 ENCOUNTER — Ambulatory Visit: Payer: BC Managed Care – PPO | Admitting: Medical

## 2017-03-10 VITALS — BP 138/81 | HR 72 | Temp 97.8°F | Resp 16 | Ht 61.0 in | Wt 133.0 lb

## 2017-03-10 DIAGNOSIS — R5383 Other fatigue: Secondary | ICD-10-CM

## 2017-03-10 DIAGNOSIS — R110 Nausea: Secondary | ICD-10-CM | POA: Diagnosis not present

## 2017-03-10 DIAGNOSIS — R059 Cough, unspecified: Secondary | ICD-10-CM

## 2017-03-10 DIAGNOSIS — R05 Cough: Secondary | ICD-10-CM

## 2017-03-10 DIAGNOSIS — M791 Myalgia, unspecified site: Secondary | ICD-10-CM | POA: Diagnosis not present

## 2017-03-10 LAB — POC INFLUENZA A&B (BINAX/QUICKVUE)
INFLUENZA A, POC: NEGATIVE
INFLUENZA B, POC: NEGATIVE

## 2017-03-10 MED ORDER — ONDANSETRON 8 MG PO TBDP: 8.0000 mg | ORAL_TABLET | Freq: Three times a day (TID) | ORAL | 0 refills | Status: DC | PRN

## 2017-03-10 MED ORDER — OSELTAMIVIR PHOSPHATE 75 MG PO CAPS: 75.0000 mg | ORAL_CAPSULE | Freq: Two times a day (BID) | ORAL | 0 refills | Status: DC

## 2017-03-10 NOTE — Progress Notes (Signed)
Subjective:    Patient ID: Heidi [name], female    DOB: [birth year], 67 y.o.   MRN: [id number]  HPI  Pt in states just yesterday morning she got nausea yesterday and some today as well. She vomited one time today. Pt tried expired zofran but she needs new rx. Pt had no diarrhea. No known contacts. No recent antibiotics in past 2 weeks. Pt does report some body aches. Occasional rare cough. Pt states fatigue since this morning.  Went to bed early last night.   Pt does not the achiness in her body is progessively getting worse.  Pt states got flu vaccine this September.  Review of Systems  Constitutional: Positive for fatigue. Negative for chills and fever.  HENT: Negative for congestion, ear pain, facial swelling, mouth sores, postnasal drip, rhinorrhea, sinus pressure, sinus pain and sore throat.   Respiratory: Positive for cough. Negative for chest tightness, shortness of breath and wheezing.   Cardiovascular: Negative for chest pain and palpitations.  Gastrointestinal: Positive for abdominal pain. Negative for blood in stool, constipation, diarrhea and nausea.       Early this am upset stomach. But non presently on exam  Genitourinary: Negative for difficulty urinating, dysuria, flank pain, frequency, hematuria, menstrual problem, pelvic pain and urgency.  Musculoskeletal: Negative for back pain, gait problem, neck pain and neck stiffness.  Skin: Negative for rash.  Neurological: Negative for dizziness, speech difficulty, weakness, numbness and headaches.  Hematological: Negative for adenopathy. Does not bruise/bleed easily.  Psychiatric/Behavioral: Negative for behavioral problems, hallucinations and self-injury. The patient is not nervous/anxious.    Past Medical History:  Diagnosis Date  . Allergic rhinitis due to pollen   . Allergy   . Anemia, unspecified   . Blood transfusion without reported diagnosis    1979  . Disorder of bone and cartilage, unspecified   . Eosinophilia    . Headache 09/24/2016  . Lymphocytosis (symptomatic)   . Osteoporosis   . Other abnormal blood chemistry   . Other and unspecified hyperlipidemia   . Pain in joint, shoulder region   . Reflux esophagitis   . Type II or unspecified type diabetes mellitus without mention of complication, uncontrolled   . Umbilical hernia without mention of obstruction or gangrene   . Unspecified disorder of liver      Social History   Socioeconomic History  . Marital status: Married    Spouse name: Eleanora Neighbor  . Number of children: 4  . Years of education: BSN  . Highest education level: Not on file  Social Needs  . Financial resource strain: Not on file  . Food insecurity - worry: Not on file  . Food insecurity - inability: Not on file  . Transportation needs - medical: Not on file  . Transportation needs - non-medical: Not on file  Occupational History  . Occupation: Teacher, adult education: [employer]  Tobacco Use  . Smoking status: Never Smoker  . Smokeless tobacco: Never Used  Substance and Sexual Activity  . Alcohol use: No  . Drug use: No  . Sexual activity: Yes    Partners: Male  Other Topics Concern  . Not on file  Social History Narrative   Married   Never smoked   Alcohol none   Exercise walk 2-3 times week             Past Surgical History:  Procedure Laterality Date  . CESAREAN SECTION    . COLONOSCOPY  2015  Dr. Arlyce Dice, normal exam    Family History  Problem Relation Age of Onset  . Pancreatic cancer Neg Hx   . Colon cancer Neg Hx   . Esophageal cancer Neg Hx   . Stomach cancer Neg Hx     Allergies  Allergen Reactions  . Ciprofloxacin Anaphylaxis    Current Outpatient Medications on File Prior to Visit  Medication Sig Dispense Refill  . calcium-vitamin D (OSCAL WITH D) 500-200 MG-UNIT per tablet Take 1 tablet by mouth daily.    . cefdinir (OMNICEF) 300 MG capsule Take 1 capsule (300 mg total) by mouth 2 (two) times daily. 20 capsule 0  .  cetirizine (ZYRTEC) 10 MG tablet Take 10 mg by mouth daily.    . COD LIVER OIL PO Take 1 capsule by mouth daily.    Marland Kitchen ibuprofen (ADVIL,MOTRIN) 800 MG tablet Take 1 tablet (800 mg total) by mouth every 8 (eight) hours as needed for moderate pain. 21 tablet 0  . meloxicam (MOBIC) 7.5 MG tablet Take 1 tablet (7.5 mg total) by mouth daily. Use as needed for shoulder pain (Patient taking differently: Take 7.5 mg by mouth daily as needed for pain. Use as needed for shoulder pain) 30 tablet 0  . Multiple Vitamin (MULTIVITAMIN) tablet Take 1 tablet by mouth daily.    . penicillin v potassium (VEETID) 500 MG tablet Take 1 tablet (500 mg total) by mouth 2 (two) times daily. 20 tablet 0  . traZODone (DESYREL) 50 MG tablet Take 0.5-1 tablets (25-50 mg total) by mouth at bedtime as needed for sleep. 30 tablet 6  . XIIDRA 5 % SOLN Place 1 drop into both eyes daily.      No current facility-administered medications on file prior to visit.     BP 138/81   Pulse 72   Temp 97.8 F (36.6 C) (Oral)   Resp 16   Ht 5\' 1"  (1.549 m)   Wt 133 lb (60.3 kg)   SpO2 100%   BMI 25.13 kg/m       Objective:   Physical Exam  General  Mental Status - Alert. General Appearance - Well groomed. Not in acute distress.  Skin Rashes- No Rashes.  HEENT Head- Normal. Ear Auditory Canal - Left- Normal. Right - Normal.Tympanic Membrane- Left- Normal. Right- Normal. Eye Sclera/Conjunctiva- Left- Normal. Right- Normal. Nose & Sinuses Nasal Mucosa- Left-  Not Boggy and Congested. Right-  Not Boggy and  Congested.Bilateral no  maxillary and no  frontal sinus pressure. Mouth & Throat Lips: Upper Lip- Normal: no dryness, cracking, pallor, cyanosis, or vesicular eruption. Lower Lip-Normal: no dryness, cracking, pallor, cyanosis or vesicular eruption. Buccal Mucosa- Bilateral- No Aphthous ulcers. Oropharynx- No Discharge or Erythema. Tonsils: Characteristics- Bilateral- No Erythema or Congestion. Size/Enlargement-  Bilateral- No enlargement. Discharge- bilateral-None.  Neck Neck- Supple. No Masses.   Chest and Lung Exam Auscultation: Breath Sounds:-Clear even and unlabored.  Cardiovascular Auscultation:Rythm- Regular, rate and rhythm. Murmurs & Other Heart Sounds:Ausculatation of the heart reveal- No Murmurs.  Lymphatic Head & Neck General Head & Neck Lymphatics: Bilateral: Description- No Localized lymphadenopathy.   Abdomen- soft,nontender, non distended positive bowel sounds.       Assessment & Plan:  You have early onset illness that appears viral syndrome-like.  You may have early flu although your rapid test come back negative.  Note sometimes rapid strep can be falsely negative.  Over the weekend I want you to make sure you are well hydrated and eat bland foods.  Scription  of Zofran sent to your pharmacy.  For your body aches you can alternate Tylenol and ibuprofen.  If over the next 24 hours you have worse signs and symptoms indicating probable flu then please start Tamiflu.  I am making this available as a printed prescription use over the weekend.  I consider getting some labs today but then decided against since your vitals were stable and he had no abdomen tenderness on exam.  However if your signs and symptoms worsen over the weekend then recommend ED evaluation.  Also would asked that you update us on how you are feeling on Monday.  Note also that if you do start to get diarrhea with the above symptoms then recommend Imodium AD over-the-counter.  Follow-up in 7 days or as needed.  Esperanza RichtersEdward Keiston Manley, PA-C

## 2017-03-10 NOTE — Patient Instructions (Addendum)
You have early onset illness that appears viral syndrome-like.  You may have early flu although your rapid test come back negative.  Note sometimes rapid strep can be falsely negative.  Over the weekend I want you to make sure you are well hydrated and eat bland foods.  Scription of Zofran sent to your pharmacy.  For your body aches you can alternate Tylenol and ibuprofen.  If over the next 24 hours you have worse signs and symptoms indicating probable flu then please start Tamiflu.  I am making this available as a printed prescription use over the weekend.  I consider getting some labs today but then decided against since your vitals were stable and he had no abdomen tenderness on exam.  However if your signs and symptoms worsen over the weekend then recommend ED evaluation.  Also would asked that you update us on how you are feeling on Monday.  Note also that if you do start to get diarrhea with the above symptoms then recommend Imodium AD over-the-counter.  Follow-up in 7 days or as needed.

## 2017-03-18 ENCOUNTER — Other Ambulatory Visit: Payer: Self-pay | Admitting: Family Medicine

## 2017-03-21 NOTE — Progress Notes (Addendum)
[facility] [address], [address] [place], [place] [address] [phone]  Date:  03/22/2017   Name:  Heidi [name]   DOB:  11/29/1950   MRN:  [id number]  PCP:  Pearline Cables, MD    Chief Complaint: Follow-up (c/o trouble remembering and moving more slowly than usual, trouble staying asleep. )   History of Present Illness:  Heidi [name] is a 67 y.o. very pleasant female patient who presents with the following:  Following up today with concern of memory loss Her husband is with her and contributes to the history,  I also spoke with her daughter on the phone   History of well controlled (diet only) DM, hyponatremia, anemia Lab Results  Component Value Date   HGBA1C 5.8 09/07/2016   I last saw her in September of last year  They have had concerns about her mental state over the last 6 months or so Her husband notes that she seems to be moving more slowly than usual, she may feel off balance Spoke with her daughter on the phone today as well  She went into a coma following the birth of her youngest child, who is now 73 yo. It was a difficult delivery that took place in [place], ended up with an emergency c section.  She lost a lot of blood and was in the hospital for 3 months, was in a coma for a few days right after the delivery.  Since then they have wondered if she might have some residual deficits from this time However she was able to obtain her BSN, and has overall recovered well  There have been some changes in the family recently- this has been stressful They feel like she is not sleeping very well recently which is not helping with her memory  She does have occasional vomiting- this did occur over the last weekend   She did have a CT of her brain back in 2015 which showed dilation of the ventricles. She was sent to neuro, saw Dr. Marjory Lies.  Nothing further done. She did not have an MRI CT  11/15: IMPRESSION: 1. Ventricular dilatation is again noted, slightly disproportionate to the degree of diffuse cortical atrophy. Is there any clinical suspicion of normal pressure hydrocephalus? 2. Atrophy and moderate small vessel ischemic change  Pt has noted that she feels dizzy, she is not sleeping well, she feels tired a lot of the time No seizure No headaches No tremor noted  No slurred speech of difficulty swallowing No facial drooping  Her husband notes that she seems to have a harder time remembering  BP Readings from Last 3 Encounters:  03/22/17 110/74  03/10/17 138/81  01/16/17 123/83   Wt Readings from Last 3 Encounters:  03/22/17 131 lb 6.4 oz (59.6 kg)  03/10/17 133 lb (60.3 kg)  01/16/17 130 lb 6.4 oz (59.1 kg)     Patient Active Problem List   Diagnosis Date Noted  . Uterine prolapse 09/24/2016  . Headache 09/24/2016  . Hyponatremia 03/03/2015  . Special screening for malignant neoplasms, colon 12/10/2012  . Lymphocytosis (symptomatic)   . Disorder of bone and cartilage, unspecified   . Type II or unspecified type diabetes mellitus without mention of complication, uncontrolled   . Reflux esophagitis   . Anemia, unspecified   . Other and unspecified hyperlipidemia   . Lymphocytosis 01/16/2012  . Idiopathic eosinophilia 04/05/2011  . NONSPECIFIC ABNORMAL RESULTS LIVR FUNCTION STUDY  08/15/2008  . ANEMIA, HX OF 08/15/2008    Past Medical History:  Diagnosis Date  . Allergic rhinitis due to pollen   . Allergy   . Anemia, unspecified   . Blood transfusion without reported diagnosis    1979  . Disorder of bone and cartilage, unspecified   . Eosinophilia   . Headache 09/24/2016  . Lymphocytosis (symptomatic)   . Osteoporosis   . Other abnormal blood chemistry   . Other and unspecified hyperlipidemia   . Pain in joint, shoulder region   . Reflux esophagitis   . Type II or unspecified type diabetes mellitus without mention of complication,  uncontrolled   . Umbilical hernia without mention of obstruction or gangrene   . Unspecified disorder of liver     Past Surgical History:  Procedure Laterality Date  . CESAREAN SECTION    . COLONOSCOPY  2015   Dr. Arlyce Dice, normal exam    Social History   Tobacco Use  . Smoking status: Never Smoker  . Smokeless tobacco: Never Used  Substance Use Topics  . Alcohol use: No  . Drug use: No    Family History  Problem Relation Age of Onset  . Pancreatic cancer Neg Hx   . Colon cancer Neg Hx   . Esophageal cancer Neg Hx   . Stomach cancer Neg Hx     Allergies  Allergen Reactions  . Ciprofloxacin Anaphylaxis    Medication list has been reviewed and updated.  Current Outpatient Medications on File Prior to Visit  Medication Sig Dispense Refill  . calcium-vitamin D (OSCAL WITH D) 500-200 MG-UNIT per tablet Take 1 tablet by mouth daily.    . cefdinir (OMNICEF) 300 MG capsule Take 1 capsule (300 mg total) by mouth 2 (two) times daily. 20 capsule 0  . cetirizine (ZYRTEC) 10 MG tablet Take 10 mg by mouth daily.    . COD LIVER OIL PO Take 1 capsule by mouth daily.    Marland Kitchen ibuprofen (ADVIL,MOTRIN) 800 MG tablet Take 1 tablet (800 mg total) by mouth every 8 (eight) hours as needed for moderate pain. 21 tablet 0  . meloxicam (MOBIC) 7.5 MG tablet Take 1 tablet (7.5 mg total) by mouth daily. Use as needed for shoulder pain (Patient taking differently: Take 7.5 mg by mouth daily as needed for pain. Use as needed for shoulder pain) 30 tablet 0  . Multiple Vitamin (MULTIVITAMIN) tablet Take 1 tablet by mouth daily.    . ondansetron (ZOFRAN ODT) 8 MG disintegrating tablet Take 1 tablet (8 mg total) by mouth every 8 (eight) hours as needed for nausea or vomiting. 20 tablet 0  . oseltamivir (TAMIFLU) 75 MG capsule Take 1 capsule (75 mg total) by mouth 2 (two) times daily. 10 capsule 0  . penicillin v potassium (VEETID) 500 MG tablet Take 1 tablet (500 mg total) by mouth 2 (two) times daily. 20  tablet 0  . traZODone (DESYREL) 50 MG tablet Take 0.5-1 tablets (25-50 mg total) by mouth at bedtime as needed for sleep. 30 tablet 6  . XIIDRA 5 % SOLN Place 1 drop into both eyes daily.      No current facility-administered medications on file prior to visit.     Review of Systems:  As per HPI- otherwise negative. No fever or chills  BP Readings from Last 3 Encounters:  03/22/17 110/74  03/10/17 138/81  01/16/17 123/83     Physical Examination: Vitals:   03/22/17 1205  BP: 110/74  Pulse: 69  Temp: 97.6 F (36.4 C)  SpO2: 99%   Vitals:   03/22/17 1205  Weight: 131 lb 6.4 oz (59.6 kg)  Height:  (1.549 m)   Body mass index is 24.83 kg/m. Ideal Body Weight: Weight in (lb) to have BMI = 25: 132  GEN: WDWN, NAD, Non-toxic, A & O x 3, looks well HEENT: Atraumatic, Normocephalic. Neck supple. No masses, No LAD.  Bilateral TM wnl, oropharynx normal.  PEERL,EOMI.   Ears and Nose: No external deformity. CV: RRR, No M/G/R. No JVD. No thrill. No extra heart sounds. PULM: CTA B, no wheezes, crackles, rhonchi. No retractions. No resp. distress. No accessory muscle use. ABD: S, NT, ND EXTR: No c/c/e NEURO walking more slowly than is normal for her, narrow based and unsteady gait Romberg normal Normal strength of all extremities and normal DTR She is not able to keep both hands coordinated with RAM testing  PSYCH: Normally interactive. Conversant. Not depressed or anxious appearing.  Calm demeanor.    Assessment and Plan: Hyponatremia - Plan: MR Brain Wo Contrast  Memory loss - Plan: TSH  Controlled type 2 diabetes mellitus without complication, without long-term current use of insulin (HCC) - Plan: CBC, Comprehensive metabolic panel, Hemoglobin A1c  Mixed hyperlipidemia  Other symptoms and signs involving the nervous system - Plan: MR Brain Wo Contrast  Gait dyspraxia - Plan: MR Brain Wo Contrast  Here today with some neurological changes that her family has  noted over the last several months- not acute but certainly concerning  Will obtain labs as above Ordered MRI of the brain- she did have concern for possible normal pressure hydrocephalus on CT back in 2015  Signed Abbe Amsterdam, MD  Received her labs 3/22- message to pt Minimal anemia, had a colonoscopy in 2015   Your blood counts are ok.  Minimal anemia- not enough to cause symptoms and baseline for you.  Thyroid is normal A1c continues to be in the pre-diabetes range, no cause for concern Your sodium is a bit low again- this could potentially be contributing to your symptoms.  Please try increasing your dietary salt intake, and reduce how much water you are drinking.  I will order a repeat BMP- please come in for lab only in about one week.  I will watch for your MRI!  Results for orders placed or performed in visit on 03/22/17  CBC  Result Value Ref Range   WBC 4.6 4.0 - 10.5 K/uL   RBC 4.15 3.87 - 5.11 Mil/uL   Platelets 257.0 150.0 - 400.0 K/uL   Hemoglobin 11.9 (L) 12.0 - 15.0 g/dL   HCT 40.9 (L) 81.1 - 91.4 %   MCV 84.6 78.0 - 100.0 fl   MCHC 33.8 30.0 - 36.0 g/dL   RDW 78.2 95.6 - 21.3 %  Comprehensive metabolic panel  Result Value Ref Range   Sodium 132 (L) 135 - 145 mEq/L   Potassium 4.5 3.5 - 5.1 mEq/L   Chloride 98 96 - 112 mEq/L   CO2 27 19 - 32 mEq/L   Glucose, Bld 90 70 - 99 mg/dL   BUN 9 6 - 23 mg/dL   Creatinine, Ser 0.86 0.40 - 1.20 mg/dL   Total Bilirubin 0.1 repeated (L) 0.2 - 1.2 mg/dL   Alkaline Phosphatase 54 39 - 117 U/L   AST 24 0 - 37 U/L   ALT 20 0 - 35 U/L   Total Protein 7.5 6.0 - 8.3 g/dL   Albumin 4.3 3.5 -  5.2 g/dL   Calcium 9.2 8.4 - 21.3 mg/dL   GFR 08.65 >78.46 mL/min  TSH  Result Value Ref Range   TSH 1.21 0.35 - 4.50 uIU/mL  Hemoglobin A1c  Result Value Ref Range   Hgb A1c MFr Bld 6.2 4.6 - 6.5 %

## 2017-03-22 ENCOUNTER — Ambulatory Visit: Payer: BC Managed Care – PPO | Admitting: Family Medicine

## 2017-03-22 ENCOUNTER — Encounter: Payer: Self-pay | Admitting: Family Medicine

## 2017-03-22 VITALS — BP 110/74 | HR 69 | Temp 97.6°F | Ht 61.0 in | Wt 131.4 lb

## 2017-03-22 DIAGNOSIS — E871 Hypo-osmolality and hyponatremia: Secondary | ICD-10-CM | POA: Diagnosis not present

## 2017-03-22 DIAGNOSIS — R29818 Other symptoms and signs involving the nervous system: Secondary | ICD-10-CM

## 2017-03-22 DIAGNOSIS — R413 Other amnesia: Secondary | ICD-10-CM | POA: Diagnosis not present

## 2017-03-22 DIAGNOSIS — E782 Mixed hyperlipidemia: Secondary | ICD-10-CM

## 2017-03-22 DIAGNOSIS — E119 Type 2 diabetes mellitus without complications: Secondary | ICD-10-CM | POA: Diagnosis not present

## 2017-03-22 DIAGNOSIS — R278 Other lack of coordination: Secondary | ICD-10-CM

## 2017-03-22 LAB — CBC
HCT: 35.1 % — ABNORMAL LOW (ref 36.0–46.0)
HEMOGLOBIN: 11.9 g/dL — AB (ref 12.0–15.0)
MCHC: 33.8 g/dL (ref 30.0–36.0)
MCV: 84.6 fl (ref 78.0–100.0)
PLATELETS: 257 10*3/uL (ref 150.0–400.0)
RBC: 4.15 Mil/uL (ref 3.87–5.11)
RDW: 12.5 % (ref 11.5–15.5)
WBC: 4.6 10*3/uL (ref 4.0–10.5)

## 2017-03-22 LAB — TSH: TSH: 1.21 u[IU]/mL (ref 0.35–4.50)

## 2017-03-22 LAB — HEMOGLOBIN A1C: HEMOGLOBIN A1C: 6.2 % (ref 4.6–6.5)

## 2017-03-22 NOTE — Patient Instructions (Signed)
I am getting labs for you today, and we will set up an MRI of your brain.  I will be in touch with your results asap

## 2017-03-23 LAB — COMPREHENSIVE METABOLIC PANEL
ALT: 20 U/L (ref 0–35)
AST: 24 U/L (ref 0–37)
Albumin: 4.3 g/dL (ref 3.5–5.2)
Alkaline Phosphatase: 54 U/L (ref 39–117)
BUN: 9 mg/dL (ref 6–23)
CALCIUM: 9.2 mg/dL (ref 8.4–10.5)
CO2: 27 meq/L (ref 19–32)
Chloride: 98 mEq/L (ref 96–112)
Creatinine, Ser: 0.87 mg/dL (ref 0.40–1.20)
GFR: 83.79 mL/min (ref >60.00)
Glucose, Bld: 90 mg/dL (ref 70–99)
Potassium: 4.5 mEq/L (ref 3.5–5.1)
Sodium: 132 mEq/L — ABNORMAL LOW (ref 135–145)
Total Protein: 7.5 g/dL (ref 6.0–8.3)

## 2017-03-24 ENCOUNTER — Encounter: Payer: Self-pay | Admitting: Family Medicine

## 2017-03-24 NOTE — Addendum Note (Signed)
Addended by: Abbe AmsterdamOPLAND, Wataru Mccowen C on: 03/24/2017 06:40 AM   Modules accepted: Orders

## 2017-04-05 ENCOUNTER — Telehealth: Payer: Self-pay | Admitting: Family Medicine

## 2017-04-05 ENCOUNTER — Encounter: Payer: Self-pay | Admitting: Family Medicine

## 2017-04-05 NOTE — Telephone Encounter (Signed)
Called [facility] again as I did not get called back - they gave me approval number of [id number]  Will pass along to referrals so she can get scheduled. Message to pt as well

## 2017-04-28 ENCOUNTER — Telehealth: Payer: Self-pay | Admitting: Family Medicine

## 2017-04-28 NOTE — Telephone Encounter (Unsigned)
Copied from CRM [id number]. Topic: Quick Communication - See Telephone Encounter >> Apr 28, 2017  4:56 PM Floria RavelingStovall, Shana A wrote: CRM for notification. See Telephone encounter for: 04/28/17.  Pt daughter called in and stated that they wanted to have the MRI done   [facility]   Address: [address], [place], [place] [id number] Phone: [phone]

## 2017-05-01 ENCOUNTER — Encounter (INDEPENDENT_AMBULATORY_CARE_PROVIDER_SITE_OTHER): Payer: Self-pay

## 2017-05-02 ENCOUNTER — Telehealth: Payer: Self-pay | Admitting: Family Medicine

## 2017-05-02 NOTE — Telephone Encounter (Signed)
Copied from CRM [id number]. Topic: Quick Communication - See Telephone Encounter >> May 02, 2017  8:09 AM Eston Mould B wrote: CRM for notification. See Telephone encounter for: 05/02/17. Theretha from [facility] called to say the  PTs MRI order is missing pts birthday and providers electronic signature, pt is scheduled for today .  Theretha's number is [phone]

## 2017-05-04 ENCOUNTER — Other Ambulatory Visit: Payer: Self-pay | Admitting: Family Medicine

## 2017-05-04 DIAGNOSIS — R110 Nausea: Secondary | ICD-10-CM

## 2017-05-24 ENCOUNTER — Encounter: Payer: Self-pay | Admitting: Family Medicine

## 2017-05-24 DIAGNOSIS — R413 Other amnesia: Secondary | ICD-10-CM

## 2017-05-26 NOTE — Addendum Note (Signed)
Addended by: Abbe Amsterdam C on: 05/26/2017 01:58 PM   Modules accepted: Orders

## 2017-06-13 ENCOUNTER — Encounter: Payer: Self-pay | Admitting: Diagnostic Neuroimaging

## 2017-06-13 ENCOUNTER — Ambulatory Visit: Payer: BC Managed Care – PPO | Admitting: Diagnostic Neuroimaging

## 2017-06-13 VITALS — BP 127/79 | HR 60 | Ht 61.0 in | Wt 131.2 lb

## 2017-06-13 DIAGNOSIS — R413 Other amnesia: Secondary | ICD-10-CM | POA: Diagnosis not present

## 2017-06-13 DIAGNOSIS — F0390 Unspecified dementia without behavioral disturbance: Secondary | ICD-10-CM

## 2017-06-13 DIAGNOSIS — R269 Unspecified abnormalities of gait and mobility: Secondary | ICD-10-CM

## 2017-06-13 NOTE — Patient Instructions (Signed)
-   consider lumbar puncture (large volume tap)  - consider memantine 10mg  at bedtime; increase to twice a day after 1-2 weeks  - consider dementia / memory loss research studies  - safety / supervision issues reviewed  - caregiver resources provided  - caution with driving and finances

## 2017-06-13 NOTE — Progress Notes (Signed)
[facility]  PATIENT: Heidi [name] DOB: 06-29-1950  REFERRING CLINICIAN: J Copeland HISTORY FROM: patient  REASON FOR VISIT: new consult    HISTORICAL  CHIEF COMPLAINT:  Chief Complaint  Patient presents with  . Memory Loss    rm 7, new problem, husband- Bampia  MMSE 21    HISTORY OF PRESENT ILLNESS:   UPDATE (06/13/17, VRP): 67 year old female with progressive short term memory loss and gait diff x 1-2 years. Also retired ~ 2 yrs ago, due to age. Had MRI brain and this shows atrophy and ventriculomegaly. No alleviating or aggravating factors. No clear loss of ADLs and ability to be independent outside of home, but patient no longer driving, so not known how she would do independently. Some slowness of walking and physical movements.   UPDATE 12/02/13: Since last visit, continues to do well. No more dizziness. Only some intermittent ringing in ears, mild. Overall, feels well.  PRIOR HPI (10/18/13): 67 year old female with diabetes and hypercholesterolemia, here for evaluation of dizziness and vertigo. 10/15/13 in the evening patient had sudden onset of room spinning sensation with nausea, sweating, vomiting. No headache, vision changes, pain. No numbness in the hands or feet. No weakness. No recent infections, trauma, change in medication. Patient went to urgent care on 10/16/13, had blood work and CT scan ordered, which were unremarkable. Patient symptoms improved yesterday and today are significantly better. Has been taking Zofran as needed for nausea.   REVIEW OF SYSTEMS: Full 14 system review of systems performed and negative except: only for dizziness ringing in ears spinning sensation.  ALLERGIES: Allergies  Allergen Reactions  . Ciprofloxacin Anaphylaxis    HOME MEDICATIONS: Outpatient Medications Prior to Visit  Medication Sig Dispense Refill  . calcium-vitamin D (OSCAL WITH D) 500-200 MG-UNIT per tablet Take 1 tablet by mouth daily.    .  cetirizine (ZYRTEC) 10 MG tablet Take 10 mg by mouth daily.    . COD LIVER OIL PO Take 1 capsule by mouth daily.    Marland Kitchen ibuprofen (ADVIL,MOTRIN) 800 MG tablet Take 1 tablet (800 mg total) by mouth every 8 (eight) hours as needed for moderate pain. 21 tablet 0  . meloxicam (MOBIC) 7.5 MG tablet Take 1 tablet (7.5 mg total) by mouth daily. Use as needed for shoulder pain (Patient taking differently: Take 7.5 mg by mouth daily as needed for pain. Use as needed for shoulder pain) 30 tablet 0  . Multiple Vitamin (MULTIVITAMIN) tablet Take 1 tablet by mouth daily.    . ondansetron (ZOFRAN ODT) 8 MG disintegrating tablet Take 1 tablet (8 mg total) by mouth every 8 (eight) hours as needed for nausea or vomiting. 20 tablet 0  . traZODone (DESYREL) 50 MG tablet Take 0.5-1 tablets (25-50 mg total) by mouth at bedtime as needed for sleep. 30 tablet 6  . XIIDRA 5 % SOLN Place 1 drop into both eyes daily.     . cefdinir (OMNICEF) 300 MG capsule Take 1 capsule (300 mg total) by mouth 2 (two) times daily. 20 capsule 0  . oseltamivir (TAMIFLU) 75 MG capsule Take 1 capsule (75 mg total) by mouth 2 (two) times daily. 10 capsule 0  . penicillin v potassium (VEETID) 500 MG tablet Take 1 tablet (500 mg total) by mouth 2 (two) times daily. 20 tablet 0  . sucralfate (CARAFATE) 1 g tablet TAKE ONE TABLET BY MOUTH 4 TIMES DAILY WITH MEALS AND AT BEDTIME. USE AS NEEDED. 40 tablet 1   No facility-administered  medications prior to visit.     PAST MEDICAL HISTORY: Past Medical History:  Diagnosis Date  . Allergic rhinitis due to pollen   . Allergy   . Anemia, unspecified   . Blood transfusion without reported diagnosis    1979  . Disorder of bone and cartilage, unspecified   . Eosinophilia   . Headache 09/24/2016  . Lymphocytosis (symptomatic)   . Osteoporosis   . Other abnormal blood chemistry   . Other and unspecified hyperlipidemia   . Pain in joint, shoulder region   . Reflux esophagitis   . Type II or  unspecified type diabetes mellitus without mention of complication, uncontrolled   . Umbilical hernia without mention of obstruction or gangrene   . Unspecified disorder of liver     PAST SURGICAL HISTORY: Past Surgical History:  Procedure Laterality Date  . CATARACT EXTRACTION, BILATERAL    . CESAREAN SECTION    . COLONOSCOPY  2015   Dr. Arlyce Dice, normal exam    FAMILY HISTORY: Family History  Problem Relation Age of Onset  . Pancreatic cancer Neg Hx   . Colon cancer Neg Hx   . Esophageal cancer Neg Hx   . Stomach cancer Neg Hx     SOCIAL HISTORY:  Social History   Socioeconomic History  . Marital status: Married    Spouse name: Eleanora Neighbor  . Number of children: 4  . Years of education: BSN  . Highest education level: Not on file  Occupational History  . Occupation: Teacher, adult education: [employer]  Social Needs  . Financial resource strain: Not on file  . Food insecurity:    Worry: Not on file    Inability: Not on file  . Transportation needs:    Medical: Not on file    Non-medical: Not on file  Tobacco Use  . Smoking status: Never Smoker  . Smokeless tobacco: Never Used  Substance and Sexual Activity  . Alcohol use: No  . Drug use: No  . Sexual activity: Yes    Partners: Male  Lifestyle  . Physical activity:    Days per week: Not on file    Minutes per session: Not on file  . Stress: Not on file  Relationships  . Social connections:    Talks on phone: Not on file    Gets together: Not on file    Attends religious service: Not on file    Active member of club or organization: Not on file    Attends meetings of clubs or organizations: Not on file    Relationship status: Not on file  . Intimate partner violence:    Fear of current or ex partner: Not on file    Emotionally abused: Not on file    Physically abused: Not on file    Forced sexual activity: Not on file  Other Topics Concern  . Not on file  Social History Narrative   Married    Never smoked   Alcohol none   Exercise walk 2-3 times week   Children 4   BS Nursing , retired           PHYSICAL EXAM  GENERAL EXAM/CONSTITUTIONAL: Vitals:  Vitals:   06/13/17 1413  BP: 127/79  Pulse: 60  Weight: 131 lb 3.2 oz (59.5 kg)  Height: 5\' 1"  (1.549 m)     Patient is in no distress; well developed, nourished and groomed; neck is supple  CARDIOVASCULAR:  Examination of carotid arteries is  normal; no carotid bruits  Regular rate and rhythm, no murmurs  Examination of peripheral vascular system by observation and palpation is normal  EYES:  Ophthalmoscopic exam of optic discs and posterior segments is normal; no papilledema or hemorrhages  MUSCULOSKELETAL:  Gait, strength, tone, movements noted in Neurologic exam below  NEUROLOGIC: MENTAL STATUS:  MMSE - Mini Mental State Exam 06/13/2017  Orientation to time 4  Orientation to Place 4  Registration 3  Attention/ Calculation 2  Recall 1  Language- name 2 objects 2  Language- repeat 0  Language- follow 3 step command 3  Language- read & follow direction 1  Write a sentence 1  Copy design 0  Total score 21    awake, alert, oriented to person, place and time  recent and remote memory intact  normal attention and concentration  language fluent, comprehension intact, naming intact,   fund of knowledge appropriate  CRANIAL NERVE:   2nd - no papilledema on fundoscopic exam  2nd, 3rd, 4th, 6th - pupils equal and reactive to light, visual fields full to confrontation, extraocular muscles intact, no nystagmus  5th - facial sensation symmetric  7th - facial strength symmetric  8th - hearing intact  9th - palate elevates symmetrically, uvula midline  11th - shoulder shrug symmetric  12th - tongue protrusion midline  MOTOR:   normal bulk and tone, full strength in the BUE, BLE  SENSORY:   normal and symmetric to light touch, temperature, vibration  COORDINATION:    finger-nose-finger, fine finger movements SLOW  REFLEXES:   deep tendon reflexes present and symmetric  GAIT/STATION:   narrow based gait; SLOW MOVEMENTS; EN BLOC TURNING     DIAGNOSTIC DATA (LABS, IMAGING, TESTING) - I reviewed patient records, labs, notes, testing and imaging myself where available.  Lab Results  Component Value Date   WBC 4.6 03/22/2017   HGB 11.9 (L) 03/22/2017   HCT 35.1 (L) 03/22/2017   MCV 84.6 03/22/2017   PLT 257.0 03/22/2017      Component Value Date/Time   NA 132 (L) 03/22/2017 1303   NA 140 06/25/2013 0846   NA 138 11/19/2012 1251   K 4.5 03/22/2017 1303   K 3.8 11/19/2012 1251   CL 98 03/22/2017 1303   CL 108 (H) 04/23/2012 1302   CO2 27 03/22/2017 1303   CO2 27 11/19/2012 1251   GLUCOSE 90 03/22/2017 1303   GLUCOSE 80 11/19/2012 1251   GLUCOSE 88 04/23/2012 1302   BUN 9 03/22/2017 1303   BUN 14 06/25/2013 0846   BUN 13.7 11/19/2012 1251   CREATININE 0.87 03/22/2017 1303   CREATININE 0.77 03/02/2015 1145   CREATININE 0.9 11/19/2012 1251   CALCIUM 9.2 03/22/2017 1303   CALCIUM 9.2 11/19/2012 1251   PROT 7.5 03/22/2017 1303   PROT 7.4 06/25/2013 0846   PROT 7.7 11/19/2012 1251   ALBUMIN 4.3 03/22/2017 1303   ALBUMIN 4.3 06/25/2013 0846   ALBUMIN 3.8 11/19/2012 1251   AST 24 03/22/2017 1303   AST 23 11/19/2012 1251   ALT 20 03/22/2017 1303   ALT 17 11/19/2012 1251   ALKPHOS 54 03/22/2017 1303   ALKPHOS 73 11/19/2012 1251   BILITOT 0.1 repeated (L) 03/22/2017 1303   BILITOT 0.33 11/19/2012 1251   GFRNONAA >60 03/02/2015 2145   GFRNONAA 82 03/02/2015 1145   GFRAA >60 03/02/2015 2145   GFRAA >89 03/02/2015 1145   Lab Results  Component Value Date   CHOL 228 (H) 09/07/2016   HDL 52.30 09/07/2016  LDLCALC 159 (H) 09/07/2016   TRIG 82.0 09/07/2016   CHOLHDL 4 09/07/2016   Lab Results  Component Value Date   HGBA1C 6.2 03/22/2017   No results found for: VITAMINB12 Lab Results  Component Value Date   TSH 1.21  03/22/2017    10/17/13 CT HEAD [I reviewed images myself and agree with interpretation. -VRP]  1. Ventricular dilatation is again noted, slightly disproportionate to the degree of diffuse cortical atrophy. Is there any clinical suspicion of normal pressure hydrocephalus?  2. Atrophy and moderate small vessel ischemic change.  05/03/17 MRI brain [report only] 1.Moderate ventricular dilatation out of proportion to the degree of cerebral atrophy. This has been described on previous CTs of the head. This likely represents central atrophy but normal pressure hydrocephalus is not excluded. Please correlate for signs and symptoms of normal pressure hydrocephalus. 2. Chronic microvascular ischemic changes in the periventricular white matter.  3.No acute intracranial abnormality.    ASSESSMENT AND PLAN  67 y.o. year old female here with sudden positional vertigo on 10/15/13, now significantly improving.   Now with memory loss, gait diff and confusion since 2017.    Ddx: MCI, dementia, NPH  1. Memory loss      PLAN:  - offered lumbar puncture (large volume tap) with pre / post PT evaluation; they will think about it  - consider memantine 10mg  at bedtime; increase to twice a day after 1-2 weeks  - consider dementia / memory loss research studies  - safety / supervision issues reviewed  - caregiver resources provided  - caution with driving and finances  Return in about 6 months (around 12/13/2017).    Suanne Marker, MD 06/13/2017, 2:38 PM Certified in Neurology, Neurophysiology and Neuroimaging  [facility] [address], [address] [place], [place] [address] [phone]

## 2017-06-14 MED ORDER — MEMANTINE HCL 10 MG PO TABS: 10.0000 mg | ORAL_TABLET | Freq: Two times a day (BID) | ORAL | 12 refills | Status: DC

## 2017-06-22 ENCOUNTER — Ambulatory Visit: Payer: BC Managed Care – PPO | Admitting: Family Medicine

## 2017-06-22 ENCOUNTER — Encounter: Payer: Self-pay | Admitting: Diagnostic Neuroimaging

## 2017-06-22 ENCOUNTER — Ambulatory Visit: Payer: Self-pay | Admitting: Family Medicine

## 2017-06-22 VITALS — BP 112/82 | HR 58 | Temp 98.4°F | Ht 61.0 in | Wt 128.8 lb

## 2017-06-22 DIAGNOSIS — E871 Hypo-osmolality and hyponatremia: Secondary | ICD-10-CM | POA: Diagnosis not present

## 2017-06-22 DIAGNOSIS — E119 Type 2 diabetes mellitus without complications: Secondary | ICD-10-CM | POA: Diagnosis not present

## 2017-06-22 DIAGNOSIS — R278 Other lack of coordination: Secondary | ICD-10-CM

## 2017-06-22 DIAGNOSIS — R413 Other amnesia: Secondary | ICD-10-CM

## 2017-06-22 DIAGNOSIS — R2689 Other abnormalities of gait and mobility: Secondary | ICD-10-CM | POA: Diagnosis not present

## 2017-06-22 LAB — BASIC METABOLIC PANEL
BUN: 13 mg/dL (ref 6–23)
CALCIUM: 9.3 mg/dL (ref 8.4–10.5)
CO2: 27 meq/L (ref 19–32)
Chloride: 92 mEq/L — ABNORMAL LOW (ref 96–112)
Creatinine, Ser: 1.03 mg/dL (ref 0.40–1.20)
GFR: 68.9 mL/min (ref >60.00)
Glucose, Bld: 100 mg/dL — ABNORMAL HIGH (ref 70–99)
POTASSIUM: 4.7 meq/L (ref 3.5–5.1)
SODIUM: 124 meq/L — AB (ref 135–145)

## 2017-06-22 LAB — POCT URINALYSIS DIP (MANUAL ENTRY)
Bilirubin, UA: NEGATIVE
Glucose, UA: NEGATIVE mg/dL
Ketones, POC UA: NEGATIVE mg/dL
Leukocytes, UA: NEGATIVE
Nitrite, UA: NEGATIVE
Protein Ur, POC: NEGATIVE mg/dL
RBC UA: NEGATIVE
SPEC GRAV UA: 1.025 (ref 1.010–1.025)
UROBILINOGEN UA: 0.2 U/dL
pH, UA: 6 (ref 5.0–8.0)

## 2017-06-22 LAB — MICROALBUMIN / CREATININE URINE RATIO
CREATININE, U: 101.5 mg/dL
MICROALB/CREAT RATIO: 0.7 mg/g (ref 0.0–30.0)
Microalb, Ur: <0.7 mg/dL (ref 0.0–1.9)

## 2017-06-22 LAB — HEMOGLOBIN A1C: Hgb A1c MFr Bld: 6 % (ref 4.6–6.5)

## 2017-06-22 NOTE — Patient Instructions (Signed)
It was good to see you today-  I am sorry that you are having a hard time with your balance Please let me know if anything is changing or getting worse  I would suggest a low, stable shoe and a cane to help prevent falls I will be in touch with your other labs asap

## 2017-06-22 NOTE — Progress Notes (Signed)
[facility] [address], [address] [place], [place] [phone] [phone]  Date:  06/22/2017   Name:  Heidi [name] Westby   DOB:  05/31/1950   MRN:  [id number]  PCP:  Pearline Cablesopland, Jessica C, MD    Chief Complaint: Fall (Pt reports falling last night and having general body weakness x 1 week. )   History of Present Illness:  Heidi [name] Kawa is a 67 y.o. very pleasant female patient who presents with the following:   History of diet controlled DM  Lab Results  Component Value Date   HGBA1C 6.0 06/22/2017    Here today with "generalized weakness," that she has noted for the last 2 -3weeks or so Yesterday she was sitting on the edge of the bed, she was getting up but fell back onto the bed.  She did not get hurt thankfully as she fell onto the bed She felt dizzy - her husband reports that she has felt dizzy off and on for several months.  Also, her family has been worried about gait changes and memory loss for a couple of years approx.  However her sx have been worse over the last 2 weeks  They did see Dr. Jessica PriestPenumlli on 6/11- they are [facility] as we are worried about normal pressure hydrocephalus. [facility] and [facility] has called her but they are working on getting back in touch to make an appt  He also did comment on her gait at last visit  No fever or chills No belly pain She notes decreased appetite  No headaches  No CP or SOB  Results for orders placed or performed in visit on 06/22/17  Basic metabolic panel  Result Value Ref Range   Sodium 124 (L) 135 - 145 mEq/L   Potassium 4.7 3.5 - 5.1 mEq/L   Chloride 92 (L) 96 - 112 mEq/L   CO2 27 19 - 32 mEq/L   Glucose, Bld 100 (H) 70 - 99 mg/dL   BUN 13 6 - 23 mg/dL   Creatinine, Ser 6.961.03 0.40 - 1.20 mg/dL   Calcium 9.3 8.4 - 29.510.5 mg/dL   GFR 28.4168.90 >32.44>60.00 mL/min  Hemoglobin A1c  Result Value Ref Range   Hgb A1c MFr Bld 6.0 4.6 - 6.5 %  Microalbumin /  creatinine urine ratio  Result Value Ref Range   Microalb, Ur <0.7 0.0 - 1.9 mg/dL   Creatinine,U 010.2101.5 mg/dL   Microalb Creat Ratio 0.7 0.0 - 30.0 mg/g  POCT urinalysis dipstick  Result Value Ref Range   Color, UA yellow yellow   Clarity, UA clear clear   Glucose, UA negative negative mg/dL   Bilirubin, UA negative negative   Ketones, POC UA negative negative mg/dL   Spec Grav, UA 7.2531.025 6.6441.010 - 1.025   Blood, UA negative negative   pH, UA 6.0 5.0 - 8.0   Protein Ur, POC negative negative mg/dL   Urobilinogen, UA 0.2 0.2 or 1.0 E.U./dL   Nitrite, UA Negative Negative   Leukocytes, UA Negative Negative     Patient Active Problem List   Diagnosis Date Noted  . Uterine prolapse 09/24/2016  . Headache 09/24/2016  . Hyponatremia 03/03/2015  . Special screening for malignant neoplasms, colon 12/10/2012  . Lymphocytosis (symptomatic)   . Disorder of bone and cartilage, unspecified   . Type II or unspecified type diabetes mellitus without mention of complication, uncontrolled   . Reflux esophagitis   .  Anemia, unspecified   . Other and unspecified hyperlipidemia   . Lymphocytosis 01/16/2012  . Idiopathic eosinophilia 04/05/2011  . NONSPECIFIC ABNORMAL RESULTS LIVR FUNCTION STUDY 08/15/2008  . ANEMIA, HX OF 08/15/2008    Past Medical History:  Diagnosis Date  . Allergic rhinitis due to pollen   . Allergy   . Anemia, unspecified   . Blood transfusion without reported diagnosis    1979  . Disorder of bone and cartilage, unspecified   . Eosinophilia   . Headache 09/24/2016  . Lymphocytosis (symptomatic)   . Osteoporosis   . Other abnormal blood chemistry   . Other and unspecified hyperlipidemia   . Pain in joint, shoulder region   . Reflux esophagitis   . Type II or unspecified type diabetes mellitus without mention of complication, uncontrolled   . Umbilical hernia without mention of obstruction or gangrene   . Unspecified disorder of liver     Past Surgical History:   Procedure Laterality Date  . CATARACT EXTRACTION, BILATERAL    . CESAREAN SECTION    . COLONOSCOPY  2015   Dr. Arlyce Dice, normal exam    Social History   Tobacco Use  . Smoking status: Never Smoker  . Smokeless tobacco: Never Used  Substance Use Topics  . Alcohol use: No  . Drug use: No    Family History  Problem Relation Age of Onset  . Pancreatic cancer Neg Hx   . Colon cancer Neg Hx   . Esophageal cancer Neg Hx   . Stomach cancer Neg Hx     Allergies  Allergen Reactions  . Ciprofloxacin Anaphylaxis    Medication list has been reviewed and updated.  Current Outpatient Medications on File Prior to Visit  Medication Sig Dispense Refill  . calcium-vitamin D (OSCAL WITH D) 500-200 MG-UNIT per tablet Take 1 tablet by mouth daily.    . cetirizine (ZYRTEC) 10 MG tablet Take 10 mg by mouth daily.    . COD LIVER OIL PO Take 1 capsule by mouth daily.    Marland Kitchen ibuprofen (ADVIL,MOTRIN) 800 MG tablet Take 1 tablet (800 mg total) by mouth every 8 (eight) hours as needed for moderate pain. 21 tablet 0  . meloxicam (MOBIC) 7.5 MG tablet Take 1 tablet (7.5 mg total) by mouth daily. Use as needed for shoulder pain (Patient taking differently: Take 7.5 mg by mouth daily as needed for pain. Use as needed for shoulder pain) 30 tablet 0  . Multiple Vitamin (MULTIVITAMIN) tablet Take 1 tablet by mouth daily.    . ondansetron (ZOFRAN ODT) 8 MG disintegrating tablet Take 1 tablet (8 mg total) by mouth every 8 (eight) hours as needed for nausea or vomiting. 20 tablet 0  . traZODone (DESYREL) 50 MG tablet Take 0.5-1 tablets (25-50 mg total) by mouth at bedtime as needed for sleep. 30 tablet 6  . XIIDRA 5 % SOLN Place 1 drop into both eyes daily.     . memantine (NAMENDA) 10 MG tablet Take 1 tablet (10 mg total) by mouth 2 (two) times daily. (Patient not taking: Reported on 06/22/2017) 60 tablet 12   No current facility-administered medications on file prior to visit.     Review of Systems:  As per  HPI- otherwise negative.   Physical Examination: Vitals:   06/22/17 1312  BP: 112/82  Pulse: (!) 58  Temp: 98.4 F (36.9 C)  SpO2: 98%   Vitals:   06/22/17 1312  Weight: 128 lb 12.8 oz (58.4 kg)  Height: 5'  1" (1.549 m)   Body mass index is 24.34 kg/m. Ideal Body Weight: Weight in (lb) to have BMI = 25: 132  GEN: WDWN, NAD, Non-toxic, A & O x 3, appears well, quiet affect HEENT: Atraumatic, Normocephalic. Neck supple. No masses, No LAD. Ears and Nose: No external deformity. CV: RRR, No M/G/R. No JVD. No thrill. No extra heart sounds. PULM: CTA B, no wheezes, crackles, rhonchi. No retractions. No resp. distress. No accessory muscle use. ABD: S, NT, ND, +BS. No rebound. No HSM. EXTR: No c/c/e NEURO gait is not grossly abnormal, but she does have to reach out to the walls for balance on occasion and takes her husband's arm  PSYCH: quiet but will answer a direct questiont. Not depressed or anxious appearing.  Calm demeanor.  She is wearing slip on type shoes with a low heel and does not have a cane   Pulse Readings from Last 3 Encounters:  06/22/17 (!) 58  06/13/17 60  03/22/17 69    Assessment and Plan: Controlled type 2 diabetes mellitus without complication, without long-term current use of insulin (HCC) - Plan: Basic metabolic panel, Hemoglobin A1c, Microalbumin / creatinine urine ratio  Poor balance - Plan: Urine Culture, POCT urinalysis dipstick  Memory loss  Gait dyspraxia  Hyponatremia  Here today with mental status change, poor balance and a fall yesterday- this is not really new, but may be worse than before.  She has a history of hyponatremia so will check her sodium, other labs, rule out UTI She is being evaluated by neurology and they are referring her to a tertiary care center for the next steps She is not in distress at this time, will obtain labs and follow-up closely  Signed Abbe Amsterdam, MD  Received her sodium this evening- low again. Will  call pt in the am and discuss- need to fluid restrict, Stop trazodone, increase salt, close lab follow-up 6/21- Called pt's husband, did not reach but LMOM. Sodium is low again, fluid restrict, stop trazodone Them spoke with Dr. Lowell Guitar, her nephrologist. Between her sx and her sodium level admission may be prudent.  Called pt and spoke with her directly, advised her to go to the ER at [facility] or [facility] and explain that she was sent due to low sodium.  She states understanding and will call her husband to come and get her.  I was not able to reach him directly at this time    Results for orders placed or performed in visit on 06/22/17  Basic metabolic panel  Result Value Ref Range   Sodium 124 (L) 135 - 145 mEq/L   Potassium 4.7 3.5 - 5.1 mEq/L   Chloride 92 (L) 96 - 112 mEq/L   CO2 27 19 - 32 mEq/L   Glucose, Bld 100 (H) 70 - 99 mg/dL   BUN 13 6 - 23 mg/dL   Creatinine, Ser 4.09 0.40 - 1.20 mg/dL   Calcium 9.3 8.4 - 81.1 mg/dL   GFR 91.47 >82.95 mL/min  Hemoglobin A1c  Result Value Ref Range   Hgb A1c MFr Bld 6.0 4.6 - 6.5 %  Microalbumin / creatinine urine ratio  Result Value Ref Range   Microalb, Ur <0.7 0.0 - 1.9 mg/dL   Creatinine,U 621.3 mg/dL   Microalb Creat Ratio 0.7 0.0 - 30.0 mg/g  POCT urinalysis dipstick  Result Value Ref Range   Color, UA yellow yellow   Clarity, UA clear clear   Glucose, UA negative negative mg/dL   Bilirubin,  UA negative negative   Ketones, POC UA negative negative mg/dL   Spec Grav, UA 9.147 8.295 - 1.025   Blood, UA negative negative   pH, UA 6.0 5.0 - 8.0   Protein Ur, POC negative negative mg/dL   Urobilinogen, UA 0.2 0.2 or 1.0 E.U./dL   Nitrite, UA Negative Negative   Leukocytes, UA Negative Negative   Called

## 2017-06-22 NOTE — Telephone Encounter (Signed)
Pt's daughter (who was not with pt) calling to report that her mother had "slid out of bed" and was concerned that she may have a UTI or is dehydration. Per daughter pt is voiding and did not sustain any injuries as a result. Pt refused ED and UCC last night. Per daughter pt was alert this am. Called both numbers given to me to call who is with pt. Left messages to both numbers [phone] then called daughter back and left message. Appointment made with PCP today at 1:15 pm.

## 2017-06-23 ENCOUNTER — Telehealth: Payer: Self-pay | Admitting: Family Medicine

## 2017-06-23 ENCOUNTER — Emergency Department (HOSPITAL_COMMUNITY): Payer: BC Managed Care – PPO

## 2017-06-23 ENCOUNTER — Observation Stay (HOSPITAL_COMMUNITY): Payer: BC Managed Care – PPO

## 2017-06-23 ENCOUNTER — Encounter (HOSPITAL_COMMUNITY): Payer: Self-pay | Admitting: Emergency Medicine

## 2017-06-23 ENCOUNTER — Other Ambulatory Visit: Payer: Self-pay

## 2017-06-23 ENCOUNTER — Observation Stay (HOSPITAL_COMMUNITY)
Admission: EM | Admit: 2017-06-23 | Discharge: 2017-06-25 | Disposition: A | Discharge status: Home / Self Care | Payer: BC Managed Care – PPO | Attending: Internal Medicine | Admitting: Internal Medicine

## 2017-06-23 DIAGNOSIS — E785 Hyperlipidemia, unspecified: Secondary | ICD-10-CM | POA: Diagnosis not present

## 2017-06-23 DIAGNOSIS — Z9842 Cataract extraction status, left eye: Secondary | ICD-10-CM | POA: Diagnosis not present

## 2017-06-23 DIAGNOSIS — R269 Unspecified abnormalities of gait and mobility: Secondary | ICD-10-CM

## 2017-06-23 DIAGNOSIS — R51 Headache: Secondary | ICD-10-CM | POA: Insufficient documentation

## 2017-06-23 DIAGNOSIS — E119 Type 2 diabetes mellitus without complications: Secondary | ICD-10-CM | POA: Diagnosis not present

## 2017-06-23 DIAGNOSIS — F329 Major depressive disorder, single episode, unspecified: Secondary | ICD-10-CM | POA: Diagnosis not present

## 2017-06-23 DIAGNOSIS — D72820 Lymphocytosis (symptomatic): Secondary | ICD-10-CM | POA: Insufficient documentation

## 2017-06-23 DIAGNOSIS — Z79899 Other long term (current) drug therapy: Secondary | ICD-10-CM | POA: Insufficient documentation

## 2017-06-23 DIAGNOSIS — E876 Hypokalemia: Secondary | ICD-10-CM | POA: Insufficient documentation

## 2017-06-23 DIAGNOSIS — D649 Anemia, unspecified: Secondary | ICD-10-CM | POA: Diagnosis not present

## 2017-06-23 DIAGNOSIS — R296 Repeated falls: Secondary | ICD-10-CM | POA: Diagnosis not present

## 2017-06-23 DIAGNOSIS — G9389 Other specified disorders of brain: Secondary | ICD-10-CM | POA: Diagnosis not present

## 2017-06-23 DIAGNOSIS — Z9841 Cataract extraction status, right eye: Secondary | ICD-10-CM | POA: Diagnosis not present

## 2017-06-23 DIAGNOSIS — F0390 Unspecified dementia without behavioral disturbance: Secondary | ICD-10-CM | POA: Insufficient documentation

## 2017-06-23 DIAGNOSIS — R413 Other amnesia: Secondary | ICD-10-CM | POA: Diagnosis not present

## 2017-06-23 DIAGNOSIS — J301 Allergic rhinitis due to pollen: Secondary | ICD-10-CM | POA: Diagnosis not present

## 2017-06-23 DIAGNOSIS — M810 Age-related osteoporosis without current pathological fracture: Secondary | ICD-10-CM | POA: Diagnosis not present

## 2017-06-23 DIAGNOSIS — Z961 Presence of intraocular lens: Secondary | ICD-10-CM | POA: Insufficient documentation

## 2017-06-23 DIAGNOSIS — E871 Hypo-osmolality and hyponatremia: Principal | ICD-10-CM | POA: Diagnosis present

## 2017-06-23 DIAGNOSIS — M25519 Pain in unspecified shoulder: Secondary | ICD-10-CM | POA: Diagnosis not present

## 2017-06-23 DIAGNOSIS — K210 Gastro-esophageal reflux disease with esophagitis: Secondary | ICD-10-CM | POA: Insufficient documentation

## 2017-06-23 DIAGNOSIS — Z881 Allergy status to other antibiotic agents status: Secondary | ICD-10-CM | POA: Diagnosis not present

## 2017-06-23 LAB — BASIC METABOLIC PANEL
Anion gap: 6 (ref 5–15)
BUN: 9 mg/dL (ref 6–20)
CO2: 25 mmol/L (ref 22–32)
Calcium: 8.9 mg/dL (ref 8.9–10.3)
Chloride: 97 mmol/L — ABNORMAL LOW (ref 101–111)
Creatinine, Ser: 0.99 mg/dL (ref 0.44–1.00)
GFR calc Af Amer: >60 mL/min (ref >60)
GFR calc non Af Amer: 58 mL/min — ABNORMAL LOW (ref >60)
Glucose, Bld: 91 mg/dL (ref 65–99)
Potassium: 4.2 mmol/L (ref 3.5–5.1)
Sodium: 128 mmol/L — ABNORMAL LOW (ref 135–145)

## 2017-06-23 LAB — CBC WITH DIFFERENTIAL/PLATELET
Basophils Absolute: 0.1 10*3/uL (ref 0.0–0.1)
Basophils Relative: 1 %
Eosinophils Absolute: 0.1 10*3/uL (ref 0.0–0.7)
Eosinophils Relative: 2 %
HCT: 33 % — ABNORMAL LOW (ref 36.0–46.0)
Hemoglobin: 11.1 g/dL — ABNORMAL LOW (ref 12.0–15.0)
Lymphocytes Relative: 71 %
Lymphs Abs: 3.5 10*3/uL (ref 0.7–4.0)
MCH: 28.2 pg (ref 26.0–34.0)
MCHC: 33.6 g/dL (ref 30.0–36.0)
MCV: 84 fL (ref 78.0–100.0)
Monocytes Absolute: 0.5 10*3/uL (ref 0.1–1.0)
Monocytes Relative: 9 %
Neutro Abs: 0.9 10*3/uL — ABNORMAL LOW (ref 1.7–7.7)
Neutrophils Relative %: 17 %
Platelets: 259 10*3/uL (ref 150–400)
RBC: 3.93 MIL/uL (ref 3.87–5.11)
RDW: 11.9 % (ref 11.5–15.5)
WBC: 5.1 10*3/uL (ref 4.0–10.5)

## 2017-06-23 LAB — URINALYSIS, ROUTINE W REFLEX MICROSCOPIC
Bilirubin Urine: NEGATIVE
Glucose, UA: NEGATIVE mg/dL
Hgb urine dipstick: NEGATIVE
Ketones, ur: NEGATIVE mg/dL
LEUKOCYTES UA: NEGATIVE
Nitrite: NEGATIVE
PH: 7.5 (ref 5.0–8.0)
Protein, ur: NEGATIVE mg/dL
SPECIFIC GRAVITY, URINE: 1.01 (ref 1.005–1.030)

## 2017-06-23 LAB — CORTISOL: Cortisol, Plasma: 3.1 ug/dL

## 2017-06-23 LAB — URINE CULTURE
MICRO NUMBER:: 90739748
Result:: NO GROWTH
SPECIMEN QUALITY:: ADEQUATE

## 2017-06-23 MED ORDER — ONDANSETRON 4 MG PO TBDP
8.0000 mg | ORAL_TABLET | Freq: Three times a day (TID) | ORAL | Status: DC | PRN
Start: 2017-06-23 — End: 2017-06-25
  Administered 2017-06-24: 8 mg via ORAL
  Filled 2017-06-23: qty 2

## 2017-06-23 MED ORDER — SODIUM CHLORIDE 0.9% FLUSH: 3.0000 mL | INTRAVENOUS | Status: DC | PRN

## 2017-06-23 MED ORDER — TRAZODONE HCL 50 MG PO TABS
25.0000 mg | ORAL_TABLET | Freq: Every evening | ORAL | Status: DC | PRN
  Administered 2017-06-24 (×2): 50 mg via ORAL
  Filled 2017-06-23 (×2): qty 1

## 2017-06-23 MED ORDER — SODIUM CHLORIDE 1 G PO TABS
1.0000 g | ORAL_TABLET | Freq: Three times a day (TID) | ORAL | Status: DC
  Administered 2017-06-24 (×2): 1 g via ORAL
  Filled 2017-06-23 (×5): qty 1

## 2017-06-23 MED ORDER — ADULT MULTIVITAMIN W/MINERALS CH
1.0000 | ORAL_TABLET | Freq: Every day | ORAL | Status: DC
  Administered 2017-06-24: 1 via ORAL
  Filled 2017-06-23 (×2): qty 1

## 2017-06-23 MED ORDER — LIFITEGRAST 5 % OP SOLN
1.0000 [drp] | Freq: Every day | OPHTHALMIC | Status: DC
  Administered 2017-06-24: 1 [drp] via OPHTHALMIC

## 2017-06-23 MED ORDER — BISACODYL 5 MG PO TBEC
5.0000 mg | DELAYED_RELEASE_TABLET | Freq: Every day | ORAL | Status: DC | PRN
Start: 2017-06-23 — End: 2017-06-25
  Administered 2017-06-24 (×2): 5 mg via ORAL
  Filled 2017-06-23 (×2): qty 1

## 2017-06-23 MED ORDER — SODIUM CHLORIDE 0.9% FLUSH
3.0000 mL | Freq: Two times a day (BID) | INTRAVENOUS | Status: DC
  Administered 2017-06-24 (×3): 3 mL via INTRAVENOUS

## 2017-06-23 MED ORDER — MEMANTINE HCL 10 MG PO TABS
10.0000 mg | ORAL_TABLET | Freq: Two times a day (BID) | ORAL | Status: DC
  Administered 2017-06-24 (×3): 10 mg via ORAL
  Filled 2017-06-23 (×5): qty 1

## 2017-06-23 MED ORDER — SODIUM CHLORIDE 0.9 % IV SOLN: 250.0000 mL | INTRAVENOUS | Status: DC | PRN

## 2017-06-23 MED ORDER — CALCIUM CARBONATE-VITAMIN D 500-200 MG-UNIT PO TABS
1.0000 | ORAL_TABLET | Freq: Every day | ORAL | Status: DC
  Administered 2017-06-24: 1 via ORAL
  Filled 2017-06-23 (×3): qty 1

## 2017-06-23 NOTE — Telephone Encounter (Signed)
Called available numbers and reached pt.  Her sodium is low- combined with her recent neuro changes she needs to be evaluated in the ER and perhaps admitted if her sodium is no better or worse.    She states understanding and will call her husband now to come and get her.  Asked her to relate to ER staff that she was asked to come due to low sodium

## 2017-06-23 NOTE — ED Notes (Signed)
Patient transported to MRI 

## 2017-06-23 NOTE — H&P (Signed)
History and Physical    Heidi [name] WUJ:[id number] DOB: Sep 05, 1950 DOA: 06/23/2017  PCP: Pearline Cables, MD  Patient coming from: Home  Chief Complaint: Hyponatremia and unsteady gait  HPI: Heidi [name] is a 67 y.o. female with medical history significant of memory loss for over 2 years, unsteady and imbalanced gait with frequent falls over 3 months, chronic problems with hyponatremia sent in by her primary care physician today for sodium level of 128 and confusion along with imbalance.  Patient has been evaluated by neurologist already and has work-up undergoing for possible normal pressure hydrocephalus.  Patient will have to go outside of the [facility] system to have this work-up done.  There is been no fevers.  There is been no nausea vomiting or diarrhea.  There is been no focal neurological deficits.  She denies any vision changes.  She denies any headaches.  She does not really know if her imbalance issues have to do with her getting up and down.  She is never had orthostatics checked.  She has an MRI ordered back in March of this year but it does not appear that it is been done.  Patient is being referred for admission for her hyponatremia as a possible cause of all of her symptoms.  Review of Systems: As per HPI otherwise 10 point review of systems negative.   Past Medical History:  Diagnosis Date  . Allergic rhinitis due to pollen   . Allergy   . Anemia, unspecified   . Blood transfusion without reported diagnosis    1979  . Disorder of bone and cartilage, unspecified   . Eosinophilia   . Headache 09/24/2016  . Lymphocytosis (symptomatic)   . Osteoporosis   . Other abnormal blood chemistry   . Other and unspecified hyperlipidemia   . Pain in joint, shoulder region   . Reflux esophagitis   . Type II or unspecified type diabetes mellitus without mention of complication, uncontrolled   . Umbilical hernia without mention of obstruction or gangrene   . Unspecified disorder of  liver     Past Surgical History:  Procedure Laterality Date  . CATARACT EXTRACTION, BILATERAL    . CESAREAN SECTION    . COLONOSCOPY  2015   Dr. Arlyce Dice, normal exam     reports that she has never smoked. She has never used smokeless tobacco. She reports that she does not drink alcohol or use drugs.  Allergies  Allergen Reactions  . Ciprofloxacin Anaphylaxis    Family states this is not correct and this is not an allergy    Family History  Problem Relation Age of Onset  . Pancreatic cancer Neg Hx   . Colon cancer Neg Hx   . Esophageal cancer Neg Hx   . Stomach cancer Neg Hx     Prior to Admission medications   Medication Sig Start Date End Date Taking? Authorizing Provider  calcium-vitamin D (OSCAL WITH D) 500-200 MG-UNIT per tablet Take 1 tablet by mouth daily.   Yes [provider]  cetirizine (ZYRTEC) 10 MG tablet Take 10 mg by mouth daily.   Yes [provider]  COD LIVER OIL PO Take 1 capsule by mouth daily.   Yes [provider]  Multiple Vitamin (MULTIVITAMIN) tablet Take 1 tablet by mouth daily.   Yes [provider]  ondansetron (ZOFRAN ODT) 8 MG disintegrating tablet Take 1 tablet (8 mg total) by mouth every 8 (eight) hours as needed for nausea or vomiting. 03/10/17  Yes Saguier,  Ramon DredgeEdward, PA-C  traZODone (DESYREL) 50 MG tablet Take 0.5-1 tablets (25-50 mg total) by mouth at bedtime as needed for sleep. 09/22/16  Yes Copland, Gwenlyn FoundJessica C, MD  XIIDRA 5 % SOLN Place 1 drop into both eyes daily.  03/21/15  Yes [provider]  ibuprofen (ADVIL,MOTRIN) 800 MG tablet Take 1 tablet (800 mg total) by mouth every 8 (eight) hours as needed for moderate pain. Patient not taking: Reported on 06/23/2017 09/24/16   Raelyn Moraawson, Rolitta, CNM  meloxicam (MOBIC) 7.5 MG tablet Take 1 tablet (7.5 mg total) by mouth daily. Use as needed for shoulder pain Patient not taking: Reported on 06/23/2017 09/07/16   Copland, Gwenlyn FoundJessica C, MD  memantine (NAMENDA) 10 MG  tablet Take 1 tablet (10 mg total) by mouth 2 (two) times daily. 06/14/17   Suanne MarkerPenumalli, Vikram R, MD    Physical Exam: Vitals:   06/23/17 1815 06/23/17 1845 06/23/17 1948 06/23/17 2015  BP: 135/74 129/73  128/84  Pulse: (!) 59 63  76  Resp: 12 14  18   Temp:      SpO2: 100% 94%  100%  Weight:   58.1 kg (128 lb)   Height:   5\' 1"  (1.549 m)       Constitutional: NAD, calm, comfortable Vitals:   06/23/17 1815 06/23/17 1845 06/23/17 1948 06/23/17 2015  BP: 135/74 129/73  128/84  Pulse: (!) 59 63  76  Resp: 12 14  18   Temp:      SpO2: 100% 94%  100%  Weight:   58.1 kg (128 lb)   Height:   5\' 1"  (1.549 m)    Eyes: PERRL, lids and conjunctivae normal ENMT: Mucous membranes are moist. Posterior pharynx clear of any exudate or lesions.Normal dentition.  Neck: normal, supple, no masses, no thyromegaly Respiratory: clear to auscultation bilaterally, no wheezing, no crackles. Normal respiratory effort. No accessory muscle use.  Cardiovascular: Regular rate and rhythm, no murmurs / rubs / gallops. No extremity edema. 2+ pedal pulses. No carotid bruits.  Abdomen: no tenderness, no masses palpated. No hepatosplenomegaly. Bowel sounds positive.  Musculoskeletal: no clubbing / cyanosis. No joint deformity upper and lower extremities. Good ROM, no contractures. Normal muscle tone.  Skin: no rashes, lesions, ulcers. No induration Neurologic: CN 2-12 grossly intact. Sensation intact, DTR normal. Strength 5/5 in all 4.  Psychiatric: Normal judgment and insight. Alert and oriented x 3. Normal mood.    Labs on Admission: I have personally reviewed following labs and imaging studies  CBC: Recent Labs  Lab 06/23/17 1452  WBC 5.1  NEUTROABS 0.9*  HGB 11.1*  HCT 33.0*  MCV 84.0  PLT 259   Basic Metabolic Panel: Recent Labs  Lab 06/22/17 1409 06/23/17 1452  NA 124* 128*  K 4.7 4.2  CL 92* 97*  CO2 27 25  GLUCOSE 100* 91  BUN 13 9  CREATININE 1.03 0.99  CALCIUM 9.3 8.9    GFR: Estimated Creatinine Clearance: 45.8 mL/min (by C-G formula based on SCr of 0.99 mg/dL). Liver Function Tests: No results for input(s): AST, ALT, ALKPHOS, BILITOT, PROT, ALBUMIN in the last 168 hours. No results for input(s): LIPASE, AMYLASE in the last 168 hours. No results for input(s): AMMONIA in the last 168 hours. Coagulation Profile: No results for input(s): INR, PROTIME in the last 168 hours. Cardiac Enzymes: No results for input(s): CKTOTAL, CKMB, CKMBINDEX, TROPONINI in the last 168 hours. BNP (last 3 results) No results for input(s): PROBNP in the last 8760 hours. HbA1C: Recent Labs  06/22/17 1409  HGBA1C 6.0   CBG: No results for input(s): GLUCAP in the last 168 hours. Lipid Profile: No results for input(s): CHOL, HDL, LDLCALC, TRIG, CHOLHDL, LDLDIRECT in the last 72 hours. Thyroid Function Tests: No results for input(s): TSH, T4TOTAL, FREET4, T3FREE, THYROIDAB in the last 72 hours. Anemia Panel: No results for input(s): VITAMINB12, FOLATE, FERRITIN, TIBC, IRON, RETICCTPCT in the last 72 hours. Urine analysis:    Component Value Date/Time   COLORURINE STRAW (A) 03/03/2015 0104   APPEARANCEUR CLEAR 03/03/2015 0104   LABSPEC <1.005 (L) 03/03/2015 0104   PHURINE 7.0 03/03/2015 0104   GLUCOSEU NEGATIVE 03/03/2015 0104   HGBUR NEGATIVE 03/03/2015 0104   BILIRUBINUR negative 06/22/2017 1348   BILIRUBINUR neg 02/08/2016 1108   KETONESUR negative 06/22/2017 1348   KETONESUR NEGATIVE 03/03/2015 0104   PROTEINUR negative 06/22/2017 1348   PROTEINUR neg 02/08/2016 1108   PROTEINUR NEGATIVE 03/03/2015 0104   UROBILINOGEN 0.2 06/22/2017 1348   NITRITE Negative 06/22/2017 1348   NITRITE neg 02/08/2016 1108   NITRITE NEGATIVE 03/03/2015 0104   LEUKOCYTESUR Negative 06/22/2017 1348   Sepsis Labs: !!!!!!!!!!!!!!!!!!!!!!!!!!!!!!!!!!!!!!!!!!!! @LABRCNTIP (procalcitonin:4,lacticidven:4) ) Recent Results (from the past 240 hour(s))  Urine Culture     Status: None    Collection Time: 06/22/17  2:09 PM  Result Value Ref Range Status   MICRO NUMBER: [id number]  Final   SPECIMEN QUALITY: ADEQUATE  Final   Sample Source NOT GIVEN  Final   STATUS: FINAL  Final   Result: No Growth  Final     Radiological Exams on Admission: Dg Chest 2 View  Result Date: 06/23/2017 CLINICAL DATA:  Hypokalemia and hyponatremia.  Confusion. EXAM: CHEST - 2 VIEW COMPARISON:  August 09, 2011 FINDINGS: There is no edema or consolidation. Heart size and pulmonary vascularity are normal. No adenopathy. There is lower thoracic dextroscoliosis. IMPRESSION: No edema or consolidation. Electronically Signed   By: Bretta Bang III M.D.   On: 06/23/2017 15:55   Ct Head Wo Contrast  Result Date: 06/23/2017 CLINICAL DATA:  Recently having some confusion, stumbling, similar to previous episodes of hyponatremia. No focal deficits or infections. EXAM: CT HEAD WITHOUT CONTRAST TECHNIQUE: Contiguous axial images were obtained from the base of the skull through the vertex without intravenous contrast. COMPARISON:  None. FINDINGS: Brain: No evidence of acute infarction, hemorrhage, extra-axial collection, or mass effect. Ventriculomegaly slightly disproportionate to the degree of cortical atrophy as can be seen with normal pressure hydrocephalus. Generalized cerebral atrophy. Periventricular white matter low attenuation likely secondary to microangiopathy. Vascular: Cerebrovascular atherosclerotic calcifications are noted. Skull: Negative for fracture or focal lesion. Sinuses/Orbits: Visualized portions of the orbits are unremarkable. Visualized portions of the paranasal sinuses and mastoid air cells are unremarkable. Other: None. IMPRESSION: 1. No acute intracranial pathology. 2. Ventriculomegaly slightly disproportionate to the degree of cortical atrophy as can be seen with normal pressure hydrocephalus. 3. Chronic microvascular disease and cerebral atrophy. Electronically Signed   By: Elige Ko    On: 06/23/2017 15:21    Old chart reviewed Case discussed with EDP Discussed with Dr. Otelia Limes with neurology  Assessment/Plan 67 year old female with memory loss for 2 years, gait abnormality for over 3 months comes in with hyponatremia Principal Problem:   Hyponatremia-patient sodium level has been improving with fluid restriction and salt tablets by her primary care physician.  Sodium yesterday was 124 today it is 128.  I do not think that her constellation of symptoms are due to her hyponatremia.  Continue with her fluid restrictions and  salt tablets Observer night repeat sodium level in the morning.   Active Problems:   Memory loss-query whether or not she is got dementia.  Will obtain MRI of the brain.  Continue work-up for normal pressure hydrocephalus as an outpatient.  No need for LP at this time according to Dr. Otelia Limes.  If MRI shows anything significant consult neurology otherwise no further work-up.    Gait abnormality-check orthostatics.  We will also check a random cortisol level.  We will also obtain a physical therapy evaluation.  Also obtain MRI of brain.  Family frustrated as they have not gotten answers and months from any of her doctors.  Her symptoms are unclear as to what is the exact cause.  I explained to the family that she is got work-up undergoing as an outpatient we will observe her overnight but I doubt that we will have her symptom-free at the time of discharge tomorrow.    DVT prophylaxis: SCDs Code Status: Full Family Communication: Husband and daughter Disposition Plan: Tomorrow Consults called: None Admission status: Observation   Meisha Salone A MD [facility]  If 7PM-7AM, please contact night-coverage [email or web address] Password [facility]  06/23/2017, 8:44 PM

## 2017-06-23 NOTE — ED Provider Notes (Signed)
Patient placed in Quick Look pathway, seen and evaluated   Chief Complaint: confusion- low sodium  HPI:   Pt with daughter who reports history of low sodium; unclear etiology but question diet and free water intake. Recently having some confusion, stumbling, similar to previous episodes of hyponatremia. No focal deficits or infections.   ROS: confusion (one)  Physical Exam:   Gen: No distress  Neuro: Awake and Alert  Skin: Warm    Focused Exam: alert and orient to personal and place, not day month or year - no focal deficits noted    Initiation of care has begun. The patient has been counseled on the process, plan, and necessity for staying for the completion/evaluation, and the remainder of the medical screening examination    Eyvonne MechanicHedges, Wandalee Klang, Cordelia Poche-C 06/23/17 1458    Margarita Grizzleay, Danielle, MD 06/24/17 [phone]

## 2017-06-23 NOTE — Telephone Encounter (Signed)
Pt's daughter Terri Skainstuma Jah called to verify if pt needs to go to the hospital. Communicated note by Dr Patsy Lageropland written today that pt does need to go to the hospital for  low sodium. Sodium result given to pt's daughter. Pt's daughter verbalized understanding and will be taking her to the ED.

## 2017-06-23 NOTE — ED Provider Notes (Signed)
[facility] EMERGENCY DEPARTMENT Provider Note   CSN: [id number] Arrival date & time: 06/23/17  1414     History   Chief Complaint Chief Complaint  Patient presents with  . Headache  . Dizziness  . Weakness    HPI Heidi [name] is a 67 y.o. female.  67 year old female with prior history of hyponatremia, dementia, and possible early normal pressure hydrocephalus who presents for evaluation of low sodiums.  Patient was seen yesterday in the clinic by her primary for same complaint.  Sodium at that point was 124.  Patient's sodium recently has been well controlled.  Patient's family reports that the patient at one point had a sodium as low as 110 which required hospitalization.  Patient's family reports that the patient has had slightly increased gait abnormality and confusion over the last 48 to 72 hours.  Patient's family is confused that her recently low sodiums may be the cause of her symptoms.  Of note, patient has been seen by [facility] neurology within the last week for a work-up for possible normal pressure hydrocephalus.  The history is provided by the patient, medical records and a relative.  Weakness  Primary symptoms include loss of balance.  Primary symptoms include no focal weakness, no loss of sensation. This is a recurrent problem. The current episode started more than 2 days ago. The problem has not changed since onset.There was no focality noted. There has been no fever.    Past Medical History:  Diagnosis Date  . Allergic rhinitis due to pollen   . Allergy   . Anemia, unspecified   . Blood transfusion without reported diagnosis    1979  . Disorder of bone and cartilage, unspecified   . Eosinophilia   . Headache 09/24/2016  . Lymphocytosis (symptomatic)   . Osteoporosis   . Other abnormal blood chemistry   . Other and unspecified hyperlipidemia   . Pain in joint, shoulder region   . Reflux esophagitis   . Type II or unspecified type  diabetes mellitus without mention of complication, uncontrolled   . Umbilical hernia without mention of obstruction or gangrene   . Unspecified disorder of liver     Patient Active Problem List   Diagnosis Date Noted  . Memory loss 06/23/2017  . Gait abnormality 06/23/2017  . Uterine prolapse 09/24/2016  . Headache 09/24/2016  . Hyponatremia 03/03/2015  . Special screening for malignant neoplasms, colon 12/10/2012  . Lymphocytosis (symptomatic)   . Disorder of bone and cartilage, unspecified   . Type II or unspecified type diabetes mellitus without mention of complication, uncontrolled   . Reflux esophagitis   . Anemia, unspecified   . Other and unspecified hyperlipidemia   . Lymphocytosis 01/16/2012  . Idiopathic eosinophilia 04/05/2011  . NONSPECIFIC ABNORMAL RESULTS LIVR FUNCTION STUDY 08/15/2008  . ANEMIA, HX OF 08/15/2008    Past Surgical History:  Procedure Laterality Date  . CATARACT EXTRACTION, BILATERAL    . CESAREAN SECTION    . COLONOSCOPY  2015   Dr. Arlyce Dice, normal exam     OB History    Gravida  5   Para  4   Term  4   Preterm      AB  1   Living        SAB      TAB      Ectopic      Multiple      Live Births  4  Home Medications    Prior to Admission medications   Medication Sig Start Date End Date Taking? Authorizing Provider  calcium-vitamin D (OSCAL WITH D) 500-200 MG-UNIT per tablet Take 1 tablet by mouth daily.   Yes [provider]  cetirizine (ZYRTEC) 10 MG tablet Take 10 mg by mouth daily.   Yes [provider]  COD LIVER OIL PO Take 1 capsule by mouth daily.   Yes [provider]  Multiple Vitamin (MULTIVITAMIN) tablet Take 1 tablet by mouth daily.   Yes [provider]  ondansetron (ZOFRAN ODT) 8 MG disintegrating tablet Take 1 tablet (8 mg total) by mouth every 8 (eight) hours as needed for nausea or vomiting. 03/10/17  Yes Saguier, Ramon Dredge, PA-C  traZODone (DESYREL) 50 MG  tablet Take 0.5-1 tablets (25-50 mg total) by mouth at bedtime as needed for sleep. 09/22/16  Yes Copland, Gwenlyn Found, MD  XIIDRA 5 % SOLN Place 1 drop into both eyes daily.  03/21/15  Yes [provider]  ibuprofen (ADVIL,MOTRIN) 800 MG tablet Take 1 tablet (800 mg total) by mouth every 8 (eight) hours as needed for moderate pain. Patient not taking: Reported on 06/23/2017 09/24/16   Raelyn Mora, CNM  meloxicam (MOBIC) 7.5 MG tablet Take 1 tablet (7.5 mg total) by mouth daily. Use as needed for shoulder pain Patient not taking: Reported on 06/23/2017 09/07/16   Copland, Gwenlyn Found, MD  memantine (NAMENDA) 10 MG tablet Take 1 tablet (10 mg total) by mouth 2 (two) times daily. 06/14/17   Penumalli, Glenford Bayley, MD    Family History Family History  Problem Relation Age of Onset  . Pancreatic cancer Neg Hx   . Colon cancer Neg Hx   . Esophageal cancer Neg Hx   . Stomach cancer Neg Hx     Social History Social History   Tobacco Use  . Smoking status: Never Smoker  . Smokeless tobacco: Never Used  Substance Use Topics  . Alcohol use: No  . Drug use: No     Allergies   Ciprofloxacin   Review of Systems Review of Systems  Neurological: Positive for weakness and loss of balance. Negative for focal weakness.  All other systems reviewed and are negative.    Physical Exam Updated Vital Signs BP 128/84   Pulse 76   Temp 97.9 F (36.6 C)   Resp 18   Ht 5\' 1"  (1.549 m)   Wt 58.1 kg (128 lb)   SpO2 100%   BMI 24.19 kg/m   Physical Exam  Constitutional: She is oriented to person, place, and time. She appears well-developed and well-nourished. No distress.  HENT:  Head: Normocephalic and atraumatic.  Mouth/Throat: Oropharynx is clear and moist.  Eyes: Pupils are equal, round, and reactive to light. Conjunctivae and EOM are normal.  Neck: Normal range of motion. Neck supple.  Cardiovascular: Normal rate, regular rhythm and normal heart sounds.  Pulmonary/Chest: Effort  normal and breath sounds normal. No respiratory distress.  Abdominal: Soft. She exhibits no distension. There is no tenderness.  Musculoskeletal: Normal range of motion. She exhibits no edema or deformity.  Neurological: She is alert and oriented to person, place, and time.  Skin: Skin is warm and dry.  Psychiatric: She has a normal mood and affect.  Nursing note and vitals reviewed.    ED Treatments / Results  Labs (all labs ordered are listed, but only abnormal results are displayed) Labs Reviewed  CBC WITH DIFFERENTIAL/PLATELET - Abnormal; Notable for the following components:  Result Value   Hemoglobin 11.1 (*)    HCT 33.0 (*)    Neutro Abs 0.9 (*)    All other components within normal limits  BASIC METABOLIC PANEL - Abnormal; Notable for the following components:   Sodium 128 (*)    Chloride 97 (*)    GFR calc non Af Amer 58 (*)    All other components within normal limits  URINALYSIS, ROUTINE W REFLEX MICROSCOPIC  CORTISOL  BASIC METABOLIC PANEL  CBC    EKG None  Radiology Dg Chest 2 View  Result Date: 06/23/2017 CLINICAL DATA:  Hypokalemia and hyponatremia.  Confusion. EXAM: CHEST - 2 VIEW COMPARISON:  August 09, 2011 FINDINGS: There is no edema or consolidation. Heart size and pulmonary vascularity are normal. No adenopathy. There is lower thoracic dextroscoliosis. IMPRESSION: No edema or consolidation. Electronically Signed   By: Bretta BangWilliam  Woodruff III M.D.   On: 06/23/2017 15:55   Ct Head Wo Contrast  Result Date: 06/23/2017 CLINICAL DATA:  Recently having some confusion, stumbling, similar to previous episodes of hyponatremia. No focal deficits or infections. EXAM: CT HEAD WITHOUT CONTRAST TECHNIQUE: Contiguous axial images were obtained from the base of the skull through the vertex without intravenous contrast. COMPARISON:  None. FINDINGS: Brain: No evidence of acute infarction, hemorrhage, extra-axial collection, or mass effect. Ventriculomegaly slightly  disproportionate to the degree of cortical atrophy as can be seen with normal pressure hydrocephalus. Generalized cerebral atrophy. Periventricular white matter low attenuation likely secondary to microangiopathy. Vascular: Cerebrovascular atherosclerotic calcifications are noted. Skull: Negative for fracture or focal lesion. Sinuses/Orbits: Visualized portions of the orbits are unremarkable. Visualized portions of the paranasal sinuses and mastoid air cells are unremarkable. Other: None. IMPRESSION: 1. No acute intracranial pathology. 2. Ventriculomegaly slightly disproportionate to the degree of cortical atrophy as can be seen with normal pressure hydrocephalus. 3. Chronic microvascular disease and cerebral atrophy. Electronically Signed   By: Elige KoHetal  Patel   On: 06/23/2017 15:21    Procedures Procedures (including critical care time)  Medications Ordered in ED Medications  multivitamin tablet 1 tablet (has no administration in time range)  calcium-vitamin D (OSCAL WITH D) 500-200 MG-UNIT per tablet 1 tablet (has no administration in time range)  Lifitegrast 5 % SOLN 1 drop (has no administration in time range)  traZODone (DESYREL) tablet 25-50 mg (has no administration in time range)  ondansetron (ZOFRAN-ODT) disintegrating tablet 8 mg (has no administration in time range)  memantine (NAMENDA) tablet 10 mg (has no administration in time range)  sodium chloride flush (NS) 0.9 % injection 3 mL (has no administration in time range)  sodium chloride flush (NS) 0.9 % injection 3 mL (has no administration in time range)  0.9 %  sodium chloride infusion (has no administration in time range)  sodium chloride tablet 1 g (has no administration in time range)     Initial Impression / Assessment and Plan / ED Course  I have reviewed the triage vital signs and the nursing notes.  Pertinent labs & imaging results that were available during my care of the patient were reviewed by me and considered in my  medical decision making (see chart for details).     MDM  Screen complete  Patient is presenting for possible symptomatic hyponatremia.  Patient's sodium today is 128.  This is moderately improved from yesterday at 124.  It is unclear whether her symptoms are more from her hyponatremia or from possible normal pressure hydrocephalus.  As discussed with neurology do not feel that  a large volume LP is appropriate at this time.  Patient has already established care with [facility] neurology and her work-up is in progress.  CT head is reviewed with neurology who do not feel that the imaging is fully suggestive of normal pressure hydrocephalus.  Hospitalist service is aware of the case and will evaluate admission patient for admission.  I feel the patient should be observed overnight to ensure that her sodium is continuing to improve with fluid restriction and improved salt intake.  Final Clinical Impressions(s) / ED Diagnoses   Final diagnoses:  Hyponatremia    ED Discharge Orders    None       Wynetta Fines, MD 06/23/17 [phone]

## 2017-06-23 NOTE — ED Triage Notes (Signed)
Pt sent by PCP for low sodium, had blood work done yesterday. Pt has had 2 days of headache, nausea, dizziness, weakness and fatigue. States she can barely walk. Denies chest pain, shortness of breath, abdominal pain. Headache currently 2/10.

## 2017-06-24 DIAGNOSIS — E871 Hypo-osmolality and hyponatremia: Secondary | ICD-10-CM

## 2017-06-24 DIAGNOSIS — R413 Other amnesia: Secondary | ICD-10-CM | POA: Diagnosis not present

## 2017-06-24 DIAGNOSIS — R269 Unspecified abnormalities of gait and mobility: Secondary | ICD-10-CM | POA: Diagnosis not present

## 2017-06-24 LAB — BASIC METABOLIC PANEL
Anion gap: 8 (ref 5–15)
BUN: 7 mg/dL (ref 6–20)
CHLORIDE: 98 mmol/L — AB (ref 101–111)
CO2: 23 mmol/L (ref 22–32)
Calcium: 8.6 mg/dL — ABNORMAL LOW (ref 8.9–10.3)
Creatinine, Ser: 0.94 mg/dL (ref 0.44–1.00)
GFR calc Af Amer: >60 mL/min (ref >60)
GFR calc non Af Amer: >60 mL/min (ref >60)
Glucose, Bld: 81 mg/dL (ref 65–99)
POTASSIUM: 4.1 mmol/L (ref 3.5–5.1)
SODIUM: 129 mmol/L — AB (ref 135–145)

## 2017-06-24 LAB — CBC
HCT: 31.7 % — ABNORMAL LOW (ref 36.0–46.0)
Hemoglobin: 11 g/dL — ABNORMAL LOW (ref 12.0–15.0)
MCH: 28.4 pg (ref 26.0–34.0)
MCHC: 34.7 g/dL (ref 30.0–36.0)
MCV: 81.9 fL (ref 78.0–100.0)
Platelets: 248 10*3/uL (ref 150–400)
RBC: 3.87 MIL/uL (ref 3.87–5.11)
RDW: 11.7 % (ref 11.5–15.5)
WBC: 5.6 10*3/uL (ref 4.0–10.5)

## 2017-06-24 LAB — AMMONIA: Ammonia: 30 umol/L (ref 9–35)

## 2017-06-24 LAB — CORTISOL: Cortisol, Plasma: 6.3 ug/dL

## 2017-06-24 LAB — FOLATE: Folate: 68.7 ng/mL (ref >5.9)

## 2017-06-24 LAB — TSH: TSH: 1.37 u[IU]/mL (ref 0.350–4.500)

## 2017-06-24 LAB — VITAMIN B12: Vitamin B-12: 3853 pg/mL — ABNORMAL HIGH (ref 180–914)

## 2017-06-24 LAB — SODIUM, URINE, RANDOM: Sodium, Ur: 148 mmol/L

## 2017-06-24 LAB — OSMOLALITY: OSMOLALITY: 267 mosm/kg — AB (ref 275–295)

## 2017-06-24 LAB — OSMOLALITY, URINE: OSMOLALITY UR: 560 mosm/kg (ref 300–900)

## 2017-06-24 MED ORDER — FLUOXETINE HCL 10 MG PO CAPS
10.0000 mg | ORAL_CAPSULE | Freq: Every day | ORAL | Status: DC
  Administered 2017-06-24 – 2017-06-25 (×2): 10 mg via ORAL
  Filled 2017-06-24 (×2): qty 1

## 2017-06-24 MED ORDER — COSYNTROPIN 0.25 MG IJ SOLR
0.2500 mg | Freq: Once | INTRAMUSCULAR | Status: AC
  Administered 2017-06-25: 0.25 mg via INTRAVENOUS
  Filled 2017-06-24: qty 0.25

## 2017-06-24 NOTE — Care Management Note (Signed)
Case Management Note  Patient Details  Name: Clarise Cruzminata Hitzeman MRN: [id number] Date of Birth: 02/20/1950  Subjective/Objective:      Pt presented with memory loss and unsteady gait.  Pt referred to outpatient PT at [facility] rehab per PT.              Action/Plan: Referral to OP PT entered and information placed on AVS.  Expected Discharge Date:                  Expected Discharge Plan:  Home/Self Care  In-House Referral:  NA  Discharge planning Services  CM Consult  Post Acute Care Choice:  (Oupatient PT) Choice offered to:  Patient  DME Arranged:  N/A DME Agency:  NA  HH Arranged:  NA HH Agency:  NA  Status of Service:  Completed, signed off  If discussed at Long Length of Stay Meetings, dates discussed:    Additional Comments:  Deveron Furlongshley  Ricardo Schubach, RN 06/24/2017, 1:17 PM

## 2017-06-24 NOTE — Consult Note (Addendum)
NEUROLOGY CONSULT  Reason for Consult:NPH Referring Physician: Dr Rodena Medin  CC: HPI: Heidi [name] is an 67 y.o. female who has baseline dementia, chronic hyponatremia and by report was "recently" diagnosed with NPH. However upon review of brain imaging, this has been a chronic finding as far back as 2005. Current MRI shows does indeed show large, bulging ventricles, there is no transependymal flow and this appears to be stable. Per husband, they have been aware of her enlarged ventricles for a very long time and do not believe that NPH is the reason for her dizziness and fall. He reports no other falls until this past Wed. She was hyponatremic 124 upon admit. Upon exam there is no gait abnormality or magnetic gait. No incontinence of bowel or bladder. She has confusion at times, but this also seems to relate to low Na and underlying memory deficits due to dementia.    Past Medical History Past Medical History:  Diagnosis Date  . Allergic rhinitis due to pollen   . Allergy   . Anemia, unspecified   . Blood transfusion without reported diagnosis    1979  . Disorder of bone and cartilage, unspecified   . Eosinophilia   . Headache 09/24/2016  . Lymphocytosis (symptomatic)   . Osteoporosis   . Other abnormal blood chemistry   . Other and unspecified hyperlipidemia   . Pain in joint, shoulder region   . Reflux esophagitis   . Type II or unspecified type diabetes mellitus without mention of complication, uncontrolled   . Umbilical hernia without mention of obstruction or gangrene   . Unspecified disorder of liver     Past Surgical History Past Surgical History:  Procedure Laterality Date  . CATARACT EXTRACTION, BILATERAL    . CESAREAN SECTION    . COLONOSCOPY  2015   Dr. Arlyce Dice, normal exam    Family History Family History  Problem Relation Age of Onset  . Pancreatic cancer Neg Hx   . Colon cancer Neg Hx   . Esophageal cancer Neg Hx   . Stomach cancer Neg Hx     Social  History    reports that she has never smoked. She has never used smokeless tobacco. She reports that she does not drink alcohol or use drugs.  Allergies Allergies  Allergen Reactions  . Ciprofloxacin Anaphylaxis    Family states this is not correct and this is not an allergy    Home Medications Medications Prior to Admission  Medication Sig Dispense Refill  . calcium-vitamin D (OSCAL WITH D) 500-200 MG-UNIT per tablet Take 1 tablet by mouth daily.    . cetirizine (ZYRTEC) 10 MG tablet Take 10 mg by mouth daily.    . COD LIVER OIL PO Take 1 capsule by mouth daily.    . Multiple Vitamin (MULTIVITAMIN) tablet Take 1 tablet by mouth daily.    . ondansetron (ZOFRAN ODT) 8 MG disintegrating tablet Take 1 tablet (8 mg total) by mouth every 8 (eight) hours as needed for nausea or vomiting. 20 tablet 0  . traZODone (DESYREL) 50 MG tablet Take 0.5-1 tablets (25-50 mg total) by mouth at bedtime as needed for sleep. 30 tablet 6  . XIIDRA 5 % SOLN Place 1 drop into both eyes daily.     Marland Kitchen ibuprofen (ADVIL,MOTRIN) 800 MG tablet Take 1 tablet (800 mg total) by mouth every 8 (eight) hours as needed for moderate pain. (Patient not taking: Reported on 06/23/2017) 21 tablet 0  . meloxicam (MOBIC) 7.5 MG tablet Take  1 tablet (7.5 mg total) by mouth daily. Use as needed for shoulder pain (Patient not taking: Reported on 06/23/2017) 30 tablet 0  . memantine (NAMENDA) 10 MG tablet Take 1 tablet (10 mg total) by mouth 2 (two) times daily. 60 tablet 12    Hospital Medications . calcium-vitamin D  1 tablet Oral Daily  . [START ON 06/25/2017] cosyntropin  0.25 mg Intravenous Once  . FLUoxetine  10 mg Oral Daily  . Lifitegrast  1 drop Both Eyes Daily  . memantine  10 mg Oral BID  . multivitamin with minerals  1 tablet Oral Daily  . sodium chloride flush  3 mL Intravenous Q12H     ROS: History obtained from pt and husband  General ROS: negative for - chills, fatigue, fever, night sweats, weight gain or weight  loss Psychological ROS: negative for - behavioral disorder, hallucinations, memory difficulties, mood swings or suicidal ideation Ophthalmic ROS: negative for - blurry vision, double vision, eye pain or loss of vision ENT ROS: negative for - epistaxis, nasal discharge, oral lesions, sore throat, tinnitus or vertigo Allergy and Immunology ROS: negative for - hives or itchy/watery eyes Hematological and Lymphatic ROS: negative for - bleeding problems, bruising or swollen lymph nodes Endocrine ROS: negative for - galactorrhea, hair pattern changes, polydipsia/polyuria or temperature intolerance Respiratory ROS: negative for - cough, hemoptysis, shortness of breath or wheezing Cardiovascular ROS: negative for - chest pain, dyspnea on exertion, edema or irregular heartbeat Gastrointestinal ROS: negative for - abdominal pain, diarrhea, hematemesis, nausea/vomiting or stool incontinence Genito-Urinary ROS: negative for - dysuria, hematuria, incontinence or urinary frequency/urgency Musculoskeletal ROS: negative for - joint swelling or muscular weakness Neurological ROS: as noted in HPI Dermatological ROS: negative for rash and skin lesion changes   Physical Examination:  Vitals:   06/23/17 2104 06/23/17 2107 06/23/17 2229 06/24/17 0635  BP: 103/70 127/79 (!) 137/92 132/74  Pulse: 67 89 65 65  Resp: 17 15 15 15   Temp:   98.1 F (36.7 C) 98.1 F (36.7 C)  TempSrc:   Oral Oral  SpO2: 100% 100%  100%  Weight:   126 lb 15.8 oz (57.6 kg)   Height:   5\' 1"  (1.549 m)     General - well nourished, no distress Heart - Regular rate and rhythm - no murmer Lungs - Clear to auscultation Abdomen - Soft - non tender Extremities - Distal pulses intact - no edema Skin - Warm and dry  Neurologic Examination:   Mental Status:  Alert, oriented. MME 1/3 at 5 min. She is unable to name more than 5 animals with 4 legs and has difficulty with recall during conversations.  Speech without evidence of  dysarthria or aphasia. Able to follow 3 step commands without difficulty.  Cranial Nerves:  II-bilateral visual fields intact III/IV/VI-Pupils were equal and reacted. Extraocular movements were full.  V/VII-no facial numbness and no facial weakness.  VIII-hearing normal.  X-normal speech and symmetrical palatal movement.  XII-midline tongue extension  Motor: Right : Upper extremity   5/5    Left:     Upper extremity   5/5  Lower extremity   5/5     Lower extremity   5/5 Tone and bulk:normal tone throughout; no atrophy noted Sensory: Intact to light touch in all extremities. Deep Tendon Reflexes: 2/4 throughout Plantars: Downgoing bilaterally  Cerebellar: Normal finger to nose and heel to shin bilaterally. Gait: extensively tested in hall with pt: I observed from behind and in front of pt. There  is no magnetic gait. no hypokinetic gait. There is no narrow or wide base, she is able to walk on toes and heals with good cadence and sequence. She transitions from chair to standing without dizziness.   LABORATORY STUDIES:  Basic Metabolic Panel: Recent Labs  Lab 06/22/17 1409 06/23/17 1452 06/24/17 0506  NA 124* 128* 129*  K 4.7 4.2 4.1  CL 92* 97* 98*  CO2 27 25 23   GLUCOSE 100* 91 81  BUN 13 9 7   CREATININE 1.03 0.99 0.94  CALCIUM 9.3 8.9 8.6*    Liver Function Tests: No results for input(s): AST, ALT, ALKPHOS, BILITOT, PROT, ALBUMIN in the last 168 hours. No results for input(s): LIPASE, AMYLASE in the last 168 hours. No results for input(s): AMMONIA in the last 168 hours.  CBC: Recent Labs  Lab 06/23/17 1452 06/24/17 0506  WBC 5.1 5.6  NEUTROABS 0.9*  --   HGB 11.1* 11.0*  HCT 33.0* 31.7*  MCV 84.0 81.9  PLT 259 248    Cardiac Enzymes: No results for input(s): CKTOTAL, CKMB, CKMBINDEX, TROPONINI in the last 168 hours.  BNP: Invalid input(s): POCBNP  CBG: No results for input(s): GLUCAP in the last 168 hours.  Microbiology:   Coagulation Studies: No  results for input(s): LABPROT, INR in the last 72 hours.  Urinalysis:  Recent Labs  Lab 06/22/17 1348 06/23/17 2110  COLORURINE  --  YELLOW  LABSPEC  --  1.010  PHURINE  --  7.5  GLUCOSEU  --  NEGATIVE  HGBUR  --  NEGATIVE  BILIRUBINUR negative NEGATIVE  KETONESUR negative NEGATIVE  PROTEINUR negative NEGATIVE  UROBILINOGEN 0.2  --   NITRITE Negative NEGATIVE  LEUKOCYTESUR Negative NEGATIVE    Lipid Panel:     Component Value Date/Time   CHOL 228 (H) 09/07/2016 0939   CHOL 220 (H) 06/25/2013 0846   TRIG 82.0 09/07/2016 0939   HDL 52.30 09/07/2016 0939   HDL 65 06/25/2013 0846   CHOLHDL 4 09/07/2016 0939   VLDL 16.4 09/07/2016 0939   LDLCALC 159 (H) 09/07/2016 0939   LDLCALC 145 (H) 06/25/2013 0846    HgbA1C:  Lab Results  Component Value Date   HGBA1C 6.0 06/22/2017    Urine Drug Screen:  No results found for: LABOPIA, COCAINSCRNUR, LABBENZ, AMPHETMU, THCU, LABBARB   Alcohol Level:  No results for input(s): ETH in the last 168 hours.  IMAGING: Dg Chest 2 View  Result Date: 06/23/2017 CLINICAL DATA:  Hypokalemia and hyponatremia.  Confusion. EXAM: CHEST - 2 VIEW COMPARISON:  August 09, 2011 FINDINGS: There is no edema or consolidation. Heart size and pulmonary vascularity are normal. No adenopathy. There is lower thoracic dextroscoliosis. IMPRESSION: No edema or consolidation. Electronically Signed   By: Bretta BangWilliam  Woodruff III M.D.   On: 06/23/2017 15:55   Ct Head Wo Contrast  Result Date: 06/23/2017 CLINICAL DATA:  Recently having some confusion, stumbling, similar to previous episodes of hyponatremia. No focal deficits or infections. EXAM: CT HEAD WITHOUT CONTRAST TECHNIQUE: Contiguous axial images were obtained from the base of the skull through the vertex without intravenous contrast. COMPARISON:  None. FINDINGS: Brain: No evidence of acute infarction, hemorrhage, extra-axial collection, or mass effect. Ventriculomegaly slightly disproportionate to the degree of  cortical atrophy as can be seen with normal pressure hydrocephalus. Generalized cerebral atrophy. Periventricular white matter low attenuation likely secondary to microangiopathy. Vascular: Cerebrovascular atherosclerotic calcifications are noted. Skull: Negative for fracture or focal lesion. Sinuses/Orbits: Visualized portions of the orbits are unremarkable. Visualized portions  of the paranasal sinuses and mastoid air cells are unremarkable. Other: None. IMPRESSION: 1. No acute intracranial pathology. 2. Ventriculomegaly slightly disproportionate to the degree of cortical atrophy as can be seen with normal pressure hydrocephalus. 3. Chronic microvascular disease and cerebral atrophy. Electronically Signed   By: Elige Ko   On: 06/23/2017 15:21   Mr Brain Wo Contrast  Result Date: 06/23/2017 CLINICAL DATA:  Ataxia and frequent falls for 3 months. Memory loss for 2 years. Suspected normal pressure hydrocephalus. EXAM: MRI HEAD WITHOUT CONTRAST TECHNIQUE: Multiplanar, multiecho pulse sequences of the brain and surrounding structures were obtained without intravenous contrast. COMPARISON:  CT HEAD June 23, 2017 and October 17, 2013. FINDINGS: INTRACRANIAL CONTENTS: No reduced diffusion to suggest acute ischemia. No susceptibility artifact to suggest hemorrhage. Stable severe lateral ventriculomegaly with lobulated contour, narrowed callosum angle. Moderate parenchymal brain volume loss. Patchy supratentorial white matter FLAIR T2 hyperintensities. No midline shift or masses. No abnormal extra-axial fluid collections. VASCULAR: Normal major intracranial vascular flow voids present at skull base. SKULL AND UPPER CERVICAL SPINE: No abnormal sellar expansion. No suspicious calvarial bone marrow signal. Craniocervical junction maintained. SINUSES/ORBITS: Mild paranasal sinus mucosal thickening. Mastoid air cells are well aerated.The included ocular globes and orbital contents are non-suspicious. Status post bilateral  ocular lens implants. OTHER: None. IMPRESSION: 1. No acute intracranial process. 2. Redemonstration of probable chronic communicating hydrocephalus. 3. Moderate parenchymal brain volume loss and moderate chronic small vessel ischemic changes. Electronically Signed   By: Awilda Metro M.D.   On: 06/23/2017 22:19     Assessment/Plan: This is a 67yr old lady who has memory loss and dizziness with one fall. She doesn't exhibit any other typical symptoms associated with NPH except for mild cognitive issues, which have been mild and stable for nearly 27yrs.   # Dizziness w/fall- pt reports feeling "whoozy" when she woke up in the night to go to bathroom. I believe this is most likely d/t Na 124 # Chronic hyponatremia- she has no chronic or acute renal issues. She adheres carefully to a water restriction at home. Should consider SIADH. # Ventriculomegaly- is not acute, this appears chronic and stable on MR when compared to [facility] from 2005, 2015. # Dementia- she has had this dx for ~17yr. Stable and no new behavior changes. Family noted increased confusion at time of fall, but is now back to baseline.   Recommendations: Low suspicion for NPH, and LP not appropriate at this time as patient is hyponatremic and will confound testing  PT/OT Slow Na replacement, enc drinks with Na such as Gatorade Continue water restrictions  Discussed with Dr Laurence Slate. Attending neurologist's note to follow   NEUROHOSPITALIST ADDENDUM Seen and examined the patient today. Patient with chronic hyponatremia of unknown etiology presents after a fall. Na 124 at the time. Recently seen by Dr Marjory Lies worsening gait for few weeks after MRI Brain showed ventriculomegaly. Neurology consulted for second opinion. On exam, no clear shuffling gait, patient has urinary urgency but not dribbling. CT head in 2005 also showed ventriculomegaly. Given her low Na, would not perform LP at this time as her exam is confounded.    I have  reviewed the contents of history and physical exam as documented by PA/ARNP/Resident and agree with above documentation.  I have discussed and formulated the above plan as documented. Edits to the note have been made as needed.    Georgiana Spinner Aroor MD [facility] [phone]   If 7pm to 7am, please call on call as listed  on AMION.

## 2017-06-24 NOTE — Progress Notes (Signed)
PROGRESS NOTE                                                                                                                                                                                                             Patient Demographics:    Heidi [name], is a 67 y.o. female, DOB - 30-Sep-1950, WUJ:[id number]  Admit date - 06/23/2017   Admitting Physician Haydee Monica, MD  Outpatient Primary MD for the patient is Copland, Gwenlyn Found, MD  LOS - 0   Chief Complaint  Patient presents with  . Headache  . Dizziness  . Weakness       Brief Narrative   67 y.o. female with medical history significant of memory loss for over 2 years, unsteady and imbalanced gait with frequent falls over 3 months, chronic problems with hyponatremia sent in by her primary care physician today for sodium level of 124, patient was recently seen by neurology as an outpatient for normal pressure hydrocephalus work-up.   Subjective:    Heidi [name] today has, No headache, No chest pain, No abdominal pain - No Nausea,    Assessment  & Plan :    Principal Problem:   Hyponatremia Active Problems:   Memory loss   Gait abnormality  Hyponatremia -Appears to be hypovolemic hyponatremia, she was orthostatic, but it does appear to be a chronic recurrent problem, no clear etiology, I will check urine sodium, urine osmolality and serum osmolality, orthostatic, have check cortisol level random x2, normal response in this patient was hypotensive, in the lower side to normal, check ACTH, ACTH stimulation test, to rule out hypoadrenalism.  Memory loss/unsteady gait Appears to be progressive over last 1 to 2 years, was seen by neurology-06/13/2017 with recommendation of normal pressure hydrocephalus work-up. -She had MRI brain during hospital stay significant for symmetrical megaly suggestive of normal pressure hydrocephalus, I have requested neurology input. -We will check TSH,  ammonia, B12 and folic acid  Depression -As per family patient with some weight loss, loss of appetite, and disruption in sleep pattern, with some tearful episodes, appears to be more depressed, will start on low-dose Prozac.    Code Status : Full code  Family Communication  : Discussed with multiple family members  Disposition Plan  : Home once stable  Consults  : Neurology  Procedures  : None  DVT Prophylaxis  : SCD, she was encouraged to ambulate  Lab Results  Component Value Date   PLT 248 06/24/2017    Antibiotics  :    Anti-infectives (From admission, onward)   None        Objective:   Vitals:   06/23/17 2104 06/23/17 2107 06/23/17 2229 06/24/17 0635  BP: 103/70 127/79 (!) 137/92 132/74  Pulse: 67 89 65 65  Resp: 17 15 15 15   Temp:   98.1 F (36.7 C) 98.1 F (36.7 C)  TempSrc:   Oral Oral  SpO2: 100% 100%  100%  Weight:   57.6 kg (126 lb 15.8 oz)   Height:   5\' 1"  (1.549 m)     Wt Readings from Last 3 Encounters:  06/23/17 57.6 kg (126 lb 15.8 oz)  06/22/17 58.4 kg (128 lb 12.8 oz)  06/13/17 59.5 kg (131 lb 3.2 oz)     Intake/Output Summary (Last 24 hours) at 06/24/2017 1558 Last data filed at 06/24/2017 0100 Gross per 24 hour  Intake -  Output 151 ml  Net -151 ml     Physical Exam  Awake Alert, overall appropriate, appears to be having depressed mood and flat affect Symmetrical Chest wall movement, Good air movement bilaterally, CTAB RRR,No Gallops,Rubs or new Murmurs, No Parasternal Heave +ve B.Sounds, Abd Soft, No tenderness, No rebound - guarding or rigidity. No Cyanosis, Clubbing or edema, No new Rash or bruise      Data Review:    CBC Recent Labs  Lab 06/23/17 1452 06/24/17 0506  WBC 5.1 5.6  HGB 11.1* 11.0*  HCT 33.0* 31.7*  PLT 259 248  MCV 84.0 81.9  MCH 28.2 28.4  MCHC 33.6 34.7  RDW 11.9 11.7  LYMPHSABS 3.5  --   MONOABS 0.5  --   EOSABS 0.1  --   BASOSABS 0.1  --     Chemistries  Recent Labs  Lab  06/22/17 1409 06/23/17 1452 06/24/17 0506  NA 124* 128* 129*  K 4.7 4.2 4.1  CL 92* 97* 98*  CO2 27 25 23   GLUCOSE 100* 91 81  BUN 13 9 7   CREATININE 1.03 0.99 0.94  CALCIUM 9.3 8.9 8.6*   ------------------------------------------------------------------------------------------------------------------ No results for input(s): CHOL, HDL, LDLCALC, TRIG, CHOLHDL, LDLDIRECT in the last 72 hours.  Lab Results  Component Value Date   HGBA1C 6.0 06/22/2017   ------------------------------------------------------------------------------------------------------------------ No results for input(s): TSH, T4TOTAL, T3FREE, THYROIDAB in the last 72 hours.  Invalid input(s): FREET3 ------------------------------------------------------------------------------------------------------------------ No results for input(s): VITAMINB12, FOLATE, FERRITIN, TIBC, IRON, RETICCTPCT in the last 72 hours.  Coagulation profile No results for input(s): INR, PROTIME in the last 168 hours.  No results for input(s): DDIMER in the last 72 hours.  Cardiac Enzymes No results for input(s): CKMB, TROPONINI, MYOGLOBIN in the last 168 hours.  Invalid input(s): CK ------------------------------------------------------------------------------------------------------------------ No results found for: BNP  Inpatient Medications  Scheduled Meds: . calcium-vitamin D  1 tablet Oral Daily  . [START ON 06/25/2017] cosyntropin  0.25 mg Intravenous Once  . FLUoxetine  10 mg Oral Daily  . Lifitegrast  1 drop Both Eyes Daily  . memantine  10 mg Oral BID  . multivitamin with minerals  1 tablet Oral Daily  . sodium chloride flush  3 mL Intravenous Q12H   Continuous Infusions: . sodium chloride     PRN Meds:.sodium chloride, bisacodyl, ondansetron, sodium chloride flush, traZODone  Micro Results Recent Results (from the past 240 hour(s))  Urine Culture  Status: None   Collection Time: 06/22/17  2:09 PM  Result  Value Ref Range Status   MICRO NUMBER: [id number]  Final   SPECIMEN QUALITY: ADEQUATE  Final   Sample Source NOT GIVEN  Final   STATUS: FINAL  Final   Result: No Growth  Final    Radiology Reports Dg Chest 2 View  Result Date: 06/23/2017 CLINICAL DATA:  Hypokalemia and hyponatremia.  Confusion. EXAM: CHEST - 2 VIEW COMPARISON:  August 09, 2011 FINDINGS: There is no edema or consolidation. Heart size and pulmonary vascularity are normal. No adenopathy. There is lower thoracic dextroscoliosis. IMPRESSION: No edema or consolidation. Electronically Signed   By: Bretta BangWilliam  Woodruff III M.D.   On: 06/23/2017 15:55   Ct Head Wo Contrast  Result Date: 06/23/2017 CLINICAL DATA:  Recently having some confusion, stumbling, similar to previous episodes of hyponatremia. No focal deficits or infections. EXAM: CT HEAD WITHOUT CONTRAST TECHNIQUE: Contiguous axial images were obtained from the base of the skull through the vertex without intravenous contrast. COMPARISON:  None. FINDINGS: Brain: No evidence of acute infarction, hemorrhage, extra-axial collection, or mass effect. Ventriculomegaly slightly disproportionate to the degree of cortical atrophy as can be seen with normal pressure hydrocephalus. Generalized cerebral atrophy. Periventricular white matter low attenuation likely secondary to microangiopathy. Vascular: Cerebrovascular atherosclerotic calcifications are noted. Skull: Negative for fracture or focal lesion. Sinuses/Orbits: Visualized portions of the orbits are unremarkable. Visualized portions of the paranasal sinuses and mastoid air cells are unremarkable. Other: None. IMPRESSION: 1. No acute intracranial pathology. 2. Ventriculomegaly slightly disproportionate to the degree of cortical atrophy as can be seen with normal pressure hydrocephalus. 3. Chronic microvascular disease and cerebral atrophy. Electronically Signed   By: Elige KoHetal  Patel   On: 06/23/2017 15:21   Mr Brain Wo Contrast  Result Date:  06/23/2017 CLINICAL DATA:  Ataxia and frequent falls for 3 months. Memory loss for 2 years. Suspected normal pressure hydrocephalus. EXAM: MRI HEAD WITHOUT CONTRAST TECHNIQUE: Multiplanar, multiecho pulse sequences of the brain and surrounding structures were obtained without intravenous contrast. COMPARISON:  CT HEAD June 23, 2017 and October 17, 2013. FINDINGS: INTRACRANIAL CONTENTS: No reduced diffusion to suggest acute ischemia. No susceptibility artifact to suggest hemorrhage. Stable severe lateral ventriculomegaly with lobulated contour, narrowed callosum angle. Moderate parenchymal brain volume loss. Patchy supratentorial white matter FLAIR T2 hyperintensities. No midline shift or masses. No abnormal extra-axial fluid collections. VASCULAR: Normal major intracranial vascular flow voids present at skull base. SKULL AND UPPER CERVICAL SPINE: No abnormal sellar expansion. No suspicious calvarial bone marrow signal. Craniocervical junction maintained. SINUSES/ORBITS: Mild paranasal sinus mucosal thickening. Mastoid air cells are well aerated.The included ocular globes and orbital contents are non-suspicious. Status post bilateral ocular lens implants. OTHER: None. IMPRESSION: 1. No acute intracranial process. 2. Redemonstration of probable chronic communicating hydrocephalus. 3. Moderate parenchymal brain volume loss and moderate chronic small vessel ischemic changes. Electronically Signed   By: Awilda Metroourtnay  Bloomer M.D.   On: 06/23/2017 22:19      Huey Bienenstockawood Amarian Botero M.D on 06/24/2017 at 3:58 PM  Between 7am to 7pm - Pager - [phone]  After 7pm go to [email or web address] - password [email or web address]  [facility] -  Office  [phone]

## 2017-06-24 NOTE — Evaluation (Signed)
Physical Therapy Evaluation Patient Details Name: Heidi [name] MRN: [id number] DOB: 07/10/1950 Today's Date: 06/24/2017   History of Present Illness  Pt is a 67 y.o. F with significant PMH of memory loss over 2 years, unsteady and imbalanced gait with frequent falls over 3 months, chronic problems with hyponatremia, who presents with hyponatremia. Has been evaluated by neurologist already with possible normal pressure hyrdocephalus.  Clinical Impression  Pt admitted with above diagnosis. Pt currently with functional limitations due to the deficits listed below (see PT Problem List). On PT evaluation, patient is modified independent with ambulation up to 400 feet without an assistive device, however, does present with static and dynamic balance deficits as well as slow gait speed. Scored a 18/24 on the Dynamic Gait Index, indicates she is at risk for falls. She performed 5 Times Sit to Stand in 19.48 seconds, which is higher than the norm for her age group (12 seconds). Patient family states that patient often feels like something is "pushing her forward," when she is moving. Patient will be a great candidate for outpatient physical therapy to address deficits.  Pt will benefit from skilled PT to increase their independence and safety with mobility to allow discharge to the venue listed below.       Follow Up Recommendations Outpatient PT    Equipment Recommendations  None recommended by PT    Recommendations for Other Services       Precautions / Restrictions Precautions Precautions: Fall Restrictions Weight Bearing Restrictions: No      Mobility  Bed Mobility Overal bed mobility: Independent                Transfers Overall transfer level: Independent                  Ambulation/Gait Ambulation/Gait assistance: Modified independent (Device/Increase time)((increased time)) Gait Distance (Feet): 400 Feet Assistive device: None Gait Pattern/deviations:  Step-through pattern;Decreased dorsiflexion - right;Decreased dorsiflexion - left;Decreased stride length Gait velocity: decreased   General Gait Details: Patient with overall slow and steady gait. Decreased reciprocal arm swing noted.   Stairs            Wheelchair Mobility    Modified Rankin (Stroke Patients Only)       Balance Overall balance assessment: Needs assistance Sitting-balance support: No upper extremity supported;Feet supported Sitting balance-Leahy Scale: Normal     Standing balance support: No upper extremity supported;During functional activity Standing balance-Leahy Scale: Good   Single Leg Stance - Right Leg: 5 Single Leg Stance - Left Leg: 4 Tandem Stance - Right Leg: 0   Rhomberg - Eyes Opened: 10   High level balance activites: Side stepping;Backward walking High Level Balance Comments: decreased step length with side stepping and backwards walking Standardized Balance Assessment Standardized Balance Assessment : Dynamic Gait Index   Dynamic Gait Index Level Surface: Mild Impairment Change in Gait Speed: Mild Impairment Gait with Horizontal Head Turns: Normal Gait with Vertical Head Turns: Normal Gait and Pivot Turn: Mild Impairment Step Over Obstacle: Mild Impairment Step Around Obstacles: Mild Impairment Steps: Mild Impairment Total Score: 18   5 times Sit to Stand: 19.48 seconds       Pertinent Vitals/Pain Pain Assessment: No/denies pain    Home Living Family/patient expects to be discharged to:: Private residence Living Arrangements: Spouse/significant other;Children Available Help at Discharge: Family Type of Home: House Home Access: Stairs to enter Entrance Stairs-Rails: Doctor, general practiceight;Left Entrance Stairs-Number of Steps: 5 Home Layout: Two level Home Equipment: Cane - single point  Prior Function Level of Independence: Needs assistance   Gait / Transfers Assistance Needed: Previously independent with gait, but patient  family states over last 3 months patient has become increasingly unsteady, occasionally needing "two hand assist," and use of cane  ADL's / Homemaking Assistance Needed: independent        Hand Dominance        Extremity/Trunk Assessment   Upper Extremity Assessment Upper Extremity Assessment: Overall WFL for tasks assessed    Lower Extremity Assessment Lower Extremity Assessment: Overall WFL for tasks assessed    Cervical / Trunk Assessment Cervical / Trunk Assessment: Normal  Communication   Communication: No difficulties  Cognition Arousal/Alertness: Awake/alert Behavior During Therapy: WFL for tasks assessed/performed Overall Cognitive Status: History of cognitive impairments - at baseline                                 General Comments: history of memory loss for 2 years. WFL for tasks assessed today      General Comments      Exercises     Assessment/Plan    PT Assessment Patient needs continued PT services  PT Problem List Decreased activity tolerance;Decreased balance;Decreased mobility       PT Treatment Interventions Gait training;Stair training;Functional mobility training;Therapeutic activities;Therapeutic exercise;Balance training;Patient/family education    PT Goals (Current goals can be found in the Care Plan section)  Acute Rehab PT Goals Patient Stated Goal: improve balance PT Goal Formulation: With patient Time For Goal Achievement: 07/08/17 Potential to Achieve Goals: Good    Frequency Min 3X/week   Barriers to discharge        Co-evaluation               AM-PAC PT "6 Clicks" Daily Activity  Outcome Measure Difficulty turning over in bed (including adjusting bedclothes, sheets and blankets)?: None Difficulty moving from lying on back to sitting on the side of the bed? : None Difficulty sitting down on and standing up from a chair with arms (e.g., wheelchair, bedside commode, etc,.)?: None Help needed moving to  and from a bed to chair (including a wheelchair)?: None Help needed walking in hospital room?: None Help needed climbing 3-5 steps with a railing? : A Little 6 Click Score: 23    End of Session Equipment Utilized During Treatment: Gait belt Activity Tolerance: Patient tolerated treatment well Patient left: in chair;with call bell/phone within reach;with family/visitor present Nurse Communication: Mobility status PT Visit Diagnosis: Unsteadiness on feet (R26.81);Difficulty in walking, not elsewhere classified (R26.2)    Time: 0837-0900 PT Time Calculation (min) (ACUTE ONLY): 23 min   Charges:   PT Evaluation $PT Eval Low Complexity: 1 Low PT Treatments $Therapeutic Activity: 8-22 mins   PT G Codes:       Heidi [name], PT, DPT Acute Rehabilitation Services  Pager: [phone]   Heidi [name] 06/24/2017, 9:23 AM

## 2017-06-25 ENCOUNTER — Encounter: Payer: Self-pay | Admitting: Family Medicine

## 2017-06-25 DIAGNOSIS — E871 Hypo-osmolality and hyponatremia: Secondary | ICD-10-CM | POA: Diagnosis not present

## 2017-06-25 LAB — CBC
HEMATOCRIT: 34.1 % — AB (ref 36.0–46.0)
HEMOGLOBIN: 11.7 g/dL — AB (ref 12.0–15.0)
MCH: 28.1 pg (ref 26.0–34.0)
MCHC: 34.3 g/dL (ref 30.0–36.0)
MCV: 81.8 fL (ref 78.0–100.0)
Platelets: UNDETERMINED 10*3/uL (ref 150–400)
RBC: 4.17 MIL/uL (ref 3.87–5.11)
RDW: 11.7 % (ref 11.5–15.5)
WBC: 4.4 10*3/uL (ref 4.0–10.5)

## 2017-06-25 LAB — ACTH STIMULATION, 3 TIME POINTS
Cortisol, 30 Min: 12.8 ug/dL
Cortisol, 60 Min: 16.2 ug/dL
Cortisol, Base: 3.9 ug/dL

## 2017-06-25 LAB — BASIC METABOLIC PANEL
Anion gap: 7 (ref 5–15)
BUN: 6 mg/dL (ref 6–20)
CHLORIDE: 96 mmol/L — AB (ref 101–111)
CO2: 24 mmol/L (ref 22–32)
CREATININE: 0.9 mg/dL (ref 0.44–1.00)
Calcium: 8.8 mg/dL — ABNORMAL LOW (ref 8.9–10.3)
GFR calc non Af Amer: >60 mL/min (ref >60)
Glucose, Bld: 83 mg/dL (ref 65–99)
Potassium: 4.6 mmol/L (ref 3.5–5.1)
Sodium: 127 mmol/L — ABNORMAL LOW (ref 135–145)

## 2017-06-25 MED ORDER — PANTOPRAZOLE SODIUM 40 MG PO TBEC: 40.0000 mg | DELAYED_RELEASE_TABLET | Freq: Every day | ORAL | 0 refills | Status: DC

## 2017-06-25 MED ORDER — GUAIFENESIN 100 MG/5ML PO SOLN
5.0000 mL | ORAL | Status: DC | PRN
  Filled 2017-06-25: qty 5

## 2017-06-25 MED ORDER — HYDROCORTISONE 10 MG PO TABS
10.0000 mg | ORAL_TABLET | Freq: Two times a day (BID) | ORAL | Status: DC
  Administered 2017-06-25: 10 mg via ORAL
  Filled 2017-06-25: qty 1

## 2017-06-25 MED ORDER — HYDROCORTISONE 10 MG PO TABS: 10.0000 mg | ORAL_TABLET | Freq: Two times a day (BID) | ORAL | 0 refills | Status: DC

## 2017-06-25 MED ORDER — FLUOXETINE HCL 10 MG PO CAPS: 10.0000 mg | ORAL_CAPSULE | Freq: Every day | ORAL | 0 refills | Status: DC

## 2017-06-25 NOTE — Progress Notes (Signed)
Heidi [name] to be D/C'd  per MD order. Discussed with the patient and all questions fully answered.  VSS, Skin clean, dry and intact without evidence of skin break down, no evidence of skin tears noted.  IV catheter discontinued intact. Site without signs and symptoms of complications. Dressing and pressure applied.  An After Visit Summary was printed and given to the patient. Patient received prescription.  D/c education completed with patient/family including follow up instructions, medication list, d/c activities limitations if indicated, with other d/c instructions as indicated by MD - patient able to verbalize understanding, all questions fully answered.   Patient instructed to return to ED, call 911, or call MD for any changes in condition.   Patient to be escorted via WC, and D/C home via private auto.

## 2017-06-25 NOTE — Discharge Summary (Signed)
Heidi Cruzminata Childers, is a 67 y.o. female  DOB 03/02/1950  MRN [id number].  Admission date:  06/23/2017  Admitting Physician  Haydee Monicaachal A David, MD  Discharge Date:  06/25/2017   Primary MD  Copland, Gwenlyn FoundJessica C, MD  Recommendations for primary care physician for things to follow:  -Check BMP tomorrow to follow on sodium level.  Please follow on ACTH and aldosterone level, were still pending at time of discharge -Patient will need referral to Endocrinology as an outpatient for possible adrenal insufficiency work-up. -Patient [facility]   Admission Diagnosis  Hyponatremia [E87.1]   Discharge Diagnosis  Hyponatremia [E87.1]    Principal Problem:   Hyponatremia Active Problems:   Memory loss   Gait abnormality      Past Medical History:  Diagnosis Date  . Allergic rhinitis due to pollen   . Allergy   . Anemia, unspecified   . Blood transfusion without reported diagnosis    1979  . Disorder of bone and cartilage, unspecified   . Eosinophilia   . Headache 09/24/2016  . Lymphocytosis (symptomatic)   . Osteoporosis   . Other abnormal blood chemistry   . Other and unspecified hyperlipidemia   . Pain in joint, shoulder region   . Reflux esophagitis   . Type II or unspecified type diabetes mellitus without mention of complication, uncontrolled   . Umbilical hernia without mention of obstruction or gangrene   . Unspecified disorder of liver     Past Surgical History:  Procedure Laterality Date  . CATARACT EXTRACTION, BILATERAL    . CESAREAN SECTION    . COLONOSCOPY  2015   Dr. Arlyce DiceKaplan, normal exam       History of present illness and  Hospital Course:     Kindly see H&P for history of present illness and admission details, please review complete Labs, Consult reports and Test reports for all details in brief  HPI  from the history and physical done on  the day of admission HPI: Heidi [name] is a 67 y.o. female with medical history significant of memory loss for over 2 years, unsteady and imbalanced gait with frequent falls over 3 months, chronic problems with hyponatremia sent in by her primary care physician today for sodium level of 128 and confusion along with imbalance.  Patient has been evaluated by neurologist already and has work-up undergoing for possible normal pressure hydrocephalus.  Patient will have to go outside of the [facility] system to have this work-up done.  There is been no fevers.  There is been no nausea vomiting or diarrhea.  There is been no focal neurological deficits.  She denies any vision changes.  She denies any headaches.  She does not really know if her imbalance issues have to do with her getting up and down.  She is never had orthostatics checked.  She has an MRI ordered back in March of this year but it does not appear that it is been done.  Patient is being referred  for admission for her hyponatremia as a possible cause of all of her symptoms.    Hospital Course  67 y.o.femalewith medical history significant ofmemory loss for over 2 years, unsteady and imbalanced gait with frequent falls over 3 months, chronic problems with hyponatremia sent in by her primary care physician today for sodium level of 124, patient was recently seen by neurology as an outpatient for hyponatremia,  normal pressure hydrocephalus work-up.  Hyponatremia -She did have previous episodes of hyponatremia in the past, per family she appears to have diagnosis of SIADH, sodium was 124 at PCP office, was 128 in the hospital, work-up significant for SIADH, serum osmolality of 267, urine sodium of 148, urine osmolality of 560, currently she has been following a fluid restriction in the past, and became more liberal with it recently, her sodium was 127 on discharge, I have discussed with the family bedside(3 daughters, one is anesthesiologist, second is  OB/GYN, and third his podiatrist), he is to follow with her PCP tomorrow regarding repeat labs, as she is being started on hydrocortisone, as do not want rapid correction of her hyponatremia.  Questionable adrenal insufficiency -Sent with depressed mood, loss of appetite, nausea, decreased oral intake, she was orthostatic on presentation, with mild hyponatremia, suspicion for adrenal insufficiency, her cortisol level was on the lower side x3 despite her presentation(orthostasis), adrenal insufficiency test was obtained, it was suboptimal response, it did not reach 18 or above, have discussed with family, for now she will be started on low-dose hydrocortisone, and daughter will be able to arrange with endocrinology as an outpatient for further work-up. -B12, folic acid the higher side, TSH and ammonia within normal limit -ACTH and the aldosterone are pending at time of discharge  Memory problem/unsteady gait -Patient has been followed by neurology as an outpatient for work-up of normal pressure hydrocephalus, neurology was consulted during hospital stay, MRI was obtained during hospital stay, she was noted to have ventriculomegaly, but per neurology it is present since 2005, and they do recommend to pursue work-up as an outpatient, they did not recommend LP during hospital stay for work-up in the setting of her hyponatremia and it might confuse diagnosis.  Depression -Started on low-dose Prozac    Discharge Condition:  stable   Follow UP  Follow-up Information    [facility].   Specialty:  Rehabilitation Why:  Therapist will contact you to set up appointments.  Contact information: [address] [id number] mc [place] [address] [phone]       Copland, Gwenlyn Found, MD Follow up in 1 day(s).   Specialty:  Family Medicine Contact information: [address] [place]  [address] [phone]             Discharge Instructions  and  Discharge Medications     Discharge Instructions    Ambulatory referral to Physical Therapy   Complete by:  As directed    Discharge instructions   Complete by:  As directed    Follow with Primary MD Copland, Gwenlyn Found, MD by tomorrow.  Get CBC, CMP, checked  by Primary MD next visit.    Activity: As tolerated with Full fall precautions use walker/cane & assistance as needed   Disposition Home    Diet: Regular diet.  On your next visit with your primary care physician please Get Medicines reviewed and adjusted.   Please request your Prim.MD to go over all Hospital Tests and Procedure/Radiological results at  the follow up, please get all Hospital records sent to your Prim MD by signing hospital release before you go home.   If you experience worsening of your admission symptoms, develop shortness of breath, life threatening emergency, suicidal or homicidal thoughts you must seek medical attention immediately by calling 911 or calling your MD immediately  if symptoms less severe.  You Must read complete instructions/literature along with all the possible adverse reactions/side effects for all the Medicines you take and that have been prescribed to you. Take any new Medicines after you have completely understood and accpet all the possible adverse reactions/side effects.   Do not drive, operating heavy machinery, perform activities at heights, swimming or participation in water activities or provide baby sitting services if your were admitted for syncope or siezures until you have seen by Primary MD or a Neurologist and advised to do so again.  Do not drive when taking Pain medications.    Do not take more than prescribed Pain, Sleep and Anxiety Medications  Special Instructions: If you have smoked or chewed Tobacco  in the last 2 yrs please stop smoking, stop any regular Alcohol  and or any Recreational drug  use.  Wear Seat belts while driving.   Please note  You were cared for by a hospitalist during your hospital stay. If you have any questions about your discharge medications or the care you received while you were in the hospital after you are discharged, you can call the unit and asked to speak with the hospitalist on call if the hospitalist that took care of you is not available. Once you are discharged, your primary care physician will handle any further medical issues. Please note that NO REFILLS for any discharge medications will be authorized once you are discharged, as it is imperative that you return to your primary care physician (or establish a relationship with a primary care physician if you do not have one) for your aftercare needs so that they can reassess your need for medications and monitor your lab values.   Increase activity slowly   Complete by:  As directed      Allergies as of 06/25/2017      Reactions   Ciprofloxacin Anaphylaxis   Family states this is not correct and this is not an allergy      Medication List    STOP taking these medications   ibuprofen 800 MG tablet Commonly known as:  ADVIL,MOTRIN   meloxicam 7.5 MG tablet Commonly known as:  MOBIC     TAKE these medications   calcium-vitamin D 500-200 MG-UNIT tablet Commonly known as:  OSCAL WITH D Take 1 tablet by mouth daily.   cetirizine 10 MG tablet Commonly known as:  ZYRTEC Take 10 mg by mouth daily.   COD LIVER OIL PO Take 1 capsule by mouth daily.   FLUoxetine 10 MG capsule Commonly known as:  PROZAC Take 1 capsule (10 mg total) by mouth daily.   hydrocortisone 10 MG tablet Commonly known as:  CORTEF Take 1 tablet (10 mg total) by mouth 2 (two) times daily.   memantine 10 MG tablet Commonly known as:  NAMENDA Take 1 tablet (10 mg total) by mouth 2 (two) times daily.   multivitamin tablet Take 1 tablet by mouth daily.   ondansetron 8 MG disintegrating tablet Commonly known as:   ZOFRAN ODT Take 1 tablet (8 mg total) by mouth every 8 (eight) hours as needed for nausea or vomiting.   pantoprazole 40  MG tablet Commonly known as:  PROTONIX Take 1 tablet (40 mg total) by mouth daily.   traZODone 50 MG tablet Commonly known as:  DESYREL Take 0.5-1 tablets (25-50 mg total) by mouth at bedtime as needed for sleep.   XIIDRA 5 % Soln Generic drug:  Lifitegrast Place 1 drop into both eyes daily.         Diet and Activity recommendation: See Discharge Instructions above   Consults obtained -  neurology   Major procedures and Radiology Reports - PLEASE review detailed and final reports for all details, in brief -     Dg Chest 2 View  Result Date: 06/23/2017 CLINICAL DATA:  Hypokalemia and hyponatremia.  Confusion. EXAM: CHEST - 2 VIEW COMPARISON:  August 09, 2011 FINDINGS: There is no edema or consolidation. Heart size and pulmonary vascularity are normal. No adenopathy. There is lower thoracic dextroscoliosis. IMPRESSION: No edema or consolidation. Electronically Signed   By: Bretta Bang III M.D.   On: 06/23/2017 15:55   Ct Head Wo Contrast  Result Date: 06/23/2017 CLINICAL DATA:  Recently having some confusion, stumbling, similar to previous episodes of hyponatremia. No focal deficits or infections. EXAM: CT HEAD WITHOUT CONTRAST TECHNIQUE: Contiguous axial images were obtained from the base of the skull through the vertex without intravenous contrast. COMPARISON:  None. FINDINGS: Brain: No evidence of acute infarction, hemorrhage, extra-axial collection, or mass effect. Ventriculomegaly slightly disproportionate to the degree of cortical atrophy as can be seen with normal pressure hydrocephalus. Generalized cerebral atrophy. Periventricular white matter low attenuation likely secondary to microangiopathy. Vascular: Cerebrovascular atherosclerotic calcifications are noted. Skull: Negative for fracture or focal lesion. Sinuses/Orbits: Visualized portions of the  orbits are unremarkable. Visualized portions of the paranasal sinuses and mastoid air cells are unremarkable. Other: None. IMPRESSION: 1. No acute intracranial pathology. 2. Ventriculomegaly slightly disproportionate to the degree of cortical atrophy as can be seen with normal pressure hydrocephalus. 3. Chronic microvascular disease and cerebral atrophy. Electronically Signed   By: Elige Ko   On: 06/23/2017 15:21   Mr Brain Wo Contrast  Result Date: 06/23/2017 CLINICAL DATA:  Ataxia and frequent falls for 3 months. Memory loss for 2 years. Suspected normal pressure hydrocephalus. EXAM: MRI HEAD WITHOUT CONTRAST TECHNIQUE: Multiplanar, multiecho pulse sequences of the brain and surrounding structures were obtained without intravenous contrast. COMPARISON:  CT HEAD June 23, 2017 and October 17, 2013. FINDINGS: INTRACRANIAL CONTENTS: No reduced diffusion to suggest acute ischemia. No susceptibility artifact to suggest hemorrhage. Stable severe lateral ventriculomegaly with lobulated contour, narrowed callosum angle. Moderate parenchymal brain volume loss. Patchy supratentorial white matter FLAIR T2 hyperintensities. No midline shift or masses. No abnormal extra-axial fluid collections. VASCULAR: Normal major intracranial vascular flow voids present at skull base. SKULL AND UPPER CERVICAL SPINE: No abnormal sellar expansion. No suspicious calvarial bone marrow signal. Craniocervical junction maintained. SINUSES/ORBITS: Mild paranasal sinus mucosal thickening. Mastoid air cells are well aerated.The included ocular globes and orbital contents are non-suspicious. Status post bilateral ocular lens implants. OTHER: None. IMPRESSION: 1. No acute intracranial process. 2. Redemonstration of probable chronic communicating hydrocephalus. 3. Moderate parenchymal brain volume loss and moderate chronic small vessel ischemic changes. Electronically Signed   By: Awilda Metro M.D.   On: 06/23/2017 22:19    Micro Results     Recent Results (from the past 240 hour(s))  Urine Culture     Status: None   Collection Time: 06/22/17  2:09 PM  Result Value Ref Range Status   MICRO NUMBER: [id number]  Final   SPECIMEN QUALITY: ADEQUATE  Final   Sample Source NOT GIVEN  Final   STATUS: FINAL  Final   Result: No Growth  Final       Today   Subjective:   Niva Severt today has no headache,no chest or abdominal pain,no new weakness tingling or numbness, feels much better wants to go home today.  Objective:   Blood pressure (!) 140/91, pulse 60, temperature 98.2 F (36.8 C), temperature source Oral, resp. rate 16, height 5\' 1"  (1.549 m), weight 57.6 kg (126 lb 15.8 oz), SpO2 100 %.   Intake/Output Summary (Last 24 hours) at 06/25/2017 1157 Last data filed at 06/24/2017 1609 Gross per 24 hour  Intake 220 ml  Output -  Net 220 ml    Exam Awake Alert, Oriented x 3, No new F.N deficits,she  appears to be in a better mood and more cheerful today Symmetrical Chest wall movement, Good air movement bilaterally, CTAB RRR,No Gallops,Rubs or new Murmurs, No Parasternal Heave +ve B.Sounds, Abd Soft, Non tender,No rebound -guarding or rigidity. No Cyanosis, Clubbing or edema, No new Rash or bruise  Data Review   CBC w Diff:  Lab Results  Component Value Date   WBC 4.4 06/25/2017   HGB 11.7 (L) 06/25/2017   HGB 12.0 11/19/2012   HCT 34.1 (L) 06/25/2017   HCT 37.2 11/19/2012   PLT PLATELET CLUMPS NOTED ON SMEAR, UNABLE TO ESTIMATE 06/25/2017   PLT 212 11/19/2012   LYMPHOPCT 71 06/23/2017   LYMPHOPCT 50.7 (H) 11/19/2012   MONOPCT 9 06/23/2017   MONOPCT 8.6 11/19/2012   EOSPCT 2 06/23/2017   EOSPCT 1.4 11/19/2012   BASOPCT 1 06/23/2017   BASOPCT 1.2 11/19/2012    CMP:  Lab Results  Component Value Date   NA 127 (L) 06/25/2017   NA 140 06/25/2013   NA 138 11/19/2012   K 4.6 06/25/2017   K 3.8 11/19/2012   CL 96 (L) 06/25/2017   CL 108 (H) 04/23/2012   CO2 24 06/25/2017   CO2 27 11/19/2012    BUN 6 06/25/2017   BUN 14 06/25/2013   BUN 13.7 11/19/2012   CREATININE 0.90 06/25/2017   CREATININE 0.77 03/02/2015   CREATININE 0.9 11/19/2012   PROT 7.5 03/22/2017   PROT 7.4 06/25/2013   PROT 7.7 11/19/2012   ALBUMIN 4.3 03/22/2017   ALBUMIN 4.3 06/25/2013   ALBUMIN 3.8 11/19/2012   BILITOT 0.1 repeated (L) 03/22/2017   BILITOT 0.33 11/19/2012   ALKPHOS 54 03/22/2017   ALKPHOS 73 11/19/2012   AST 24 03/22/2017   AST 23 11/19/2012   ALT 20 03/22/2017   ALT 17 11/19/2012  .   Total Time in preparing paper work, data evaluation and todays exam - 35 minutes  Huey Bienenstock M.D on 06/25/2017 at 11:57 AM  [facility]   Office  [phone]

## 2017-06-25 NOTE — Discharge Instructions (Signed)
Follow with Primary MD Copland, Gwenlyn FoundJessica C, MD by tomorrow.  Get CBC, CMP, checked  by Primary MD next visit.    Activity: As tolerated with Full fall precautions use walker/cane & assistance as needed   Disposition Home    Diet: Regular diet.  On your next visit with your primary care physician please Get Medicines reviewed and adjusted.   Please request your Prim.MD to go over all Hospital Tests and Procedure/Radiological results at the follow up, please get all Hospital records sent to your Prim MD by signing hospital release before you go home.   If you experience worsening of your admission symptoms, develop shortness of breath, life threatening emergency, suicidal or homicidal thoughts you must seek medical attention immediately by calling 911 or calling your MD immediately  if symptoms less severe.  You Must read complete instructions/literature along with all the possible adverse reactions/side effects for all the Medicines you take and that have been prescribed to you. Take any new Medicines after you have completely understood and accpet all the possible adverse reactions/side effects.   Do not drive, operating heavy machinery, perform activities at heights, swimming or participation in water activities or provide baby sitting services if your were admitted for syncope or siezures until you have seen by Primary MD or a Neurologist and advised to do so again.  Do not drive when taking Pain medications.    Do not take more than prescribed Pain, Sleep and Anxiety Medications  Special Instructions: If you have smoked or chewed Tobacco  in the last 2 yrs please stop smoking, stop any regular Alcohol  and or any Recreational drug use.  Wear Seat belts while driving.   Please note  You were cared for by a hospitalist during your hospital stay. If you have any questions about your discharge medications or the care you received while you were in the hospital after you are  discharged, you can call the unit and asked to speak with the hospitalist on call if the hospitalist that took care of you is not available. Once you are discharged, your primary care physician will handle any further medical issues. Please note that NO REFILLS for any discharge medications will be authorized once you are discharged, as it is imperative that you return to your primary care physician (or establish a relationship with a primary care physician if you do not have one) for your aftercare needs so that they can reassess your need for medications and monitor your lab values.

## 2017-06-26 ENCOUNTER — Telehealth: Payer: Self-pay

## 2017-06-26 ENCOUNTER — Encounter: Payer: Self-pay | Admitting: Family Medicine

## 2017-06-26 ENCOUNTER — Ambulatory Visit: Payer: BC Managed Care – PPO | Admitting: Family Medicine

## 2017-06-26 VITALS — BP 108/68 | HR 64 | Temp 98.2°F | Resp 16 | Ht 61.0 in | Wt 125.0 lb

## 2017-06-26 DIAGNOSIS — E271 Primary adrenocortical insufficiency: Secondary | ICD-10-CM

## 2017-06-26 DIAGNOSIS — E871 Hypo-osmolality and hyponatremia: Secondary | ICD-10-CM

## 2017-06-26 LAB — PATHOLOGIST SMEAR REVIEW

## 2017-06-26 LAB — BASIC METABOLIC PANEL
BUN: 13 mg/dL (ref 6–23)
CO2: 26 mEq/L (ref 19–32)
Calcium: 9.4 mg/dL (ref 8.4–10.5)
Chloride: 97 mEq/L (ref 96–112)
Creatinine, Ser: 1.08 mg/dL (ref 0.40–1.20)
GFR: 65.23 mL/min (ref >60.00)
GLUCOSE: 84 mg/dL (ref 70–99)
POTASSIUM: 4.3 meq/L (ref 3.5–5.1)
Sodium: 130 mEq/L — ABNORMAL LOW (ref 135–145)

## 2017-06-26 LAB — ACTH: C206 ACTH: 25.3 pg/mL (ref 7.2–63.3)

## 2017-06-26 NOTE — Telephone Encounter (Signed)
Hospital follow up call made to patient. Left message on 2 phone number listed for return call to schedule follow up and labs.

## 2017-06-26 NOTE — Telephone Encounter (Signed)
Patient has appointment today with pcp.  

## 2017-06-26 NOTE — Patient Instructions (Signed)
Great to see you looking so much better today!   We will check your sodium level today and be in touch with this result asap  I have placed a referral to endocrinology today to look for adrenal insufficiency I will call the nephrology clinic and ask them to schedule a follow-up visit for you  I will also place a standing BMP order so you can have your sodium checked any time that you feel that you need it!

## 2017-06-26 NOTE — Progress Notes (Addendum)
[facility] [address], [address] [place], [place] [address] [phone] [phone]  Date:  06/26/2017   Name:  Heidi [name]   DOB:  11/12/1950   MRN:  [id number]  PCP:  Pearline Cablesopland, Montina Dorrance C, MD    Chief Complaint: Hospitalization Follow-up (Hyponatremia, feeling much better)   History of Present Illness:  Heidi [name] Norkus is a 67 y.o. very pleasant female patient who presents with the following: Hospital follow-up visit today Following up from admission for hyponatremia and confusion-  Admission date:  06/23/2017  Admitting Physician  Haydee Monicaachal A David, MD Discharge Date:  06/25/2017  Primary MD  Rida Loudin, Gwenlyn FoundJessica C, MD  Recommendations for primary care physician for things to follow:  -Check BMP tomorrow to follow on sodium level.  Please follow on ACTH and aldosterone level, were still pending at time of discharge -Patient will need referral to Endocrinology as an outpatient for possible adrenal insufficiency work-up. -Patient [facility]  Admission Diagnosis  Hyponatremia [E87.1] Discharge Diagnosis  Hyponatremia [E87.1]    Principal Problem:   Hyponatremia Active Problems:   Memory loss   Gait abnormality     HPI  from the history and physical done on the day of admission EXB:MWUXLKGHPI:Heidi Bangurais a 67 y.o.femalewith medical history significant ofmemory loss for over 2 years, unsteady and imbalanced gait with frequent falls over 3 months, chronic problems with hyponatremia sent in by her primary care physician today for sodium level of 128 and confusion along with imbalance. Patient has been evaluated by neurologist already and has work-up undergoing for possible normal pressure hydrocephalus. Patient will have to go outside of the [facility] system to have this work-up done. There is been no fevers. There is been no nausea vomiting or diarrhea. There is been no focal neurological deficits.  She denies any vision changes. She denies any headaches. She does not really know if her imbalance issues have to do with her getting up and down. She is never had orthostatics checked. She has an MRI ordered back in March of this year but it does not appear that it is been done. Patient is being referred for admission for her hyponatremia as a possible cause of all of her symptoms.   Hospital Course  67 y.o.femalewith medical history significant ofmemory loss for over 2 years, unsteady and imbalanced gait with frequent falls over 3 months, chronic problems with hyponatremia sent in by her primary care physician today for sodium level of 124,patient was recently seen by neurology as an outpatient for hyponatremia,  normal pressure hydrocephalus work-up.  Hyponatremia -She did have previous episodes of hyponatremia in the past, per family she appears to have diagnosis of SIADH, sodium was 124 at PCP office, was 128 in the hospital, work-up significant for SIADH, serum osmolality of 267, urine sodium of 148, urine osmolality of 560, currently she has been following a fluid restriction in the past, and became more liberal with it recently, her sodium was 127 on discharge, I have discussed with the family bedside(3 daughters, one is anesthesiologist, second is OB/GYN, and third his podiatrist), he is to follow with her PCP tomorrow regarding repeat labs, as she is being started on hydrocortisone, as do not want rapid correction of her hyponatremia.  Questionable adrenal insufficiency -Sent with depressed mood, loss of appetite, nausea, decreased oral intake, she was orthostatic on presentation, with mild hyponatremia, suspicion for adrenal insufficiency, her cortisol level was on  the lower side x3 despite her presentation(orthostasis), adrenal insufficiency test was obtained, it was suboptimal response, it did not reach 18 or above, have discussed with family, for now she will be started on  low-dose hydrocortisone, and daughter will be able to arrange with endocrinology as an outpatient for further work-up. -B12, folic acid the higher side, TSH and ammonia within normal limit -ACTH and the aldosterone are pending at time of discharge  Memory problem/unsteady gait -Patient has been followed by neurology as an outpatient for work-up of normal pressure hydrocephalus, neurology was consulted during hospital stay, MRI was obtained during hospital stay, she was noted to have ventriculomegaly, but per neurology it is present since 2005, and they do recommend to pursue work-up as an outpatient, they did not recommend LP during hospital stay for work-up in the setting of her hyponatremia and it might confuse diagnosis.  Depression -Started on low-dose Prozac   Her sodium was up to 127 by time of discharge home - was 124 when I called her to direct to hospital She was started on a low dose of hydrocortisone at the hospital Today she is looking and feeling great- she says that she feels so much better, and is much more alert She is walking better and her husband noted that she is much more "alive."  She needs a referral to endocrinology which I will place today Also needs to follow-up with nephrology- I called them and asked them to schedule a follow-up visit but they need me to send in noted first for Dr. Lowell Guitar to review    Patient Active Problem List   Diagnosis Date Noted  . Memory loss 06/23/2017  . Gait abnormality 06/23/2017  . Uterine prolapse 09/24/2016  . Headache 09/24/2016  . Hyponatremia 03/03/2015  . Special screening for malignant neoplasms, colon 12/10/2012  . Lymphocytosis (symptomatic)   . Disorder of bone and cartilage, unspecified   . Type II or unspecified type diabetes mellitus without mention of complication, uncontrolled   . Reflux esophagitis   . Anemia, unspecified   . Other and unspecified hyperlipidemia   . Lymphocytosis 01/16/2012  . Idiopathic  eosinophilia 04/05/2011  . NONSPECIFIC ABNORMAL RESULTS LIVR FUNCTION STUDY 08/15/2008  . ANEMIA, HX OF 08/15/2008    Past Medical History:  Diagnosis Date  . Allergic rhinitis due to pollen   . Allergy   . Anemia, unspecified   . Blood transfusion without reported diagnosis    1979  . Disorder of bone and cartilage, unspecified   . Eosinophilia   . Headache 09/24/2016  . Lymphocytosis (symptomatic)   . Osteoporosis   . Other abnormal blood chemistry   . Other and unspecified hyperlipidemia   . Pain in joint, shoulder region   . Reflux esophagitis   . Type II or unspecified type diabetes mellitus without mention of complication, uncontrolled   . Umbilical hernia without mention of obstruction or gangrene   . Unspecified disorder of liver     Past Surgical History:  Procedure Laterality Date  . CATARACT EXTRACTION, BILATERAL    . CESAREAN SECTION    . COLONOSCOPY  2015   Dr. Arlyce Dice, normal exam    Social History   Tobacco Use  . Smoking status: Never Smoker  . Smokeless tobacco: Never Used  Substance Use Topics  . Alcohol use: No  . Drug use: No    Family History  Problem Relation Age of Onset  . Pancreatic cancer Neg Hx   . Colon cancer Neg Hx   .  Esophageal cancer Neg Hx   . Stomach cancer Neg Hx     Allergies  Allergen Reactions  . Ciprofloxacin Anaphylaxis    Family states this is not correct and this is not an allergy    Medication list has been reviewed and updated.  Current Outpatient Medications on File Prior to Visit  Medication Sig Dispense Refill  . calcium-vitamin D (OSCAL WITH D) 500-200 MG-UNIT per tablet Take 1 tablet by mouth daily.    . cetirizine (ZYRTEC) 10 MG tablet Take 10 mg by mouth daily.    . COD LIVER OIL PO Take 1 capsule by mouth daily.    Marland Kitchen FLUoxetine (PROZAC) 10 MG capsule Take 1 capsule (10 mg total) by mouth daily. 30 capsule 0  . hydrocortisone (CORTEF) 10 MG tablet Take 1 tablet (10 mg total) by mouth 2 (two) times  daily. 30 tablet 0  . memantine (NAMENDA) 10 MG tablet Take 1 tablet (10 mg total) by mouth 2 (two) times daily. 60 tablet 12  . Multiple Vitamin (MULTIVITAMIN) tablet Take 1 tablet by mouth daily.    . pantoprazole (PROTONIX) 40 MG tablet Take 1 tablet (40 mg total) by mouth daily. 30 tablet 0  . traZODone (DESYREL) 50 MG tablet Take 0.5-1 tablets (25-50 mg total) by mouth at bedtime as needed for sleep. 30 tablet 6  . XIIDRA 5 % SOLN Place 1 drop into both eyes daily.      No current facility-administered medications on file prior to visit.     Review of Systems:  As per HPI- otherwise negative.   Physical Examination: Vitals:   06/26/17 1155  BP: 108/68  Pulse: 64  Resp: 16  Temp: 98.2 F (36.8 C)  SpO2: 95%   Vitals:   06/26/17 1155  Weight: 125 lb (56.7 kg)  Height: 5\' 1"  (1.549 m)   Body mass index is 23.62 kg/m. Ideal Body Weight: Weight in (lb) to have BMI = 25: 132  GEN: WDWN, NAD, Non-toxic, A & O x 3, looks well, much more alert and well seeming than at last visit here  HEENT: Atraumatic, Normocephalic. Neck supple. No masses, No LAD. Ears and Nose: No external deformity. CV: RRR, No M/G/R. No JVD. No thrill. No extra heart sounds. PULM: CTA B, no wheezes, crackles, rhonchi. No retractions. No resp. distress. No accessory muscle use. EXTR: No c/c/e NEURO Normal gait.  Not holding onto walls today PSYCH: Normally interactive. Conversant. Not depressed or anxious appearing.  Calm demeanor.    Assessment and Plan: Hyponatremia - Plan: Basic metabolic panel  Adrenal insufficiency (Addison's disease) (HCC) - Plan: Ambulatory referral to Endocrinology  Following up today from hospital stay.  Her sx are thought to be due to hyponatremia, ?adrenal insuf and also her hydrocephalus.  Hyponatremia was better by time of discharge.  Recheck today Referral to endocrinology  She is feeling much better today which is encouraging. Will fax notes to [facility] as  required prior to scheduling her follow-up with Dr. Juleen Starr pt and her husband to let me know if any worsening of her symptoms again.  We will be sure to check her sodium right away when she has any mental status change   Signed Abbe Amsterdam, MD  Received BMP from today Results for orders placed or performed in visit on 06/26/17  Basic metabolic panel  Result Value Ref Range   Sodium 130 (L) 135 - 145 mEq/L   Potassium 4.3 3.5 - 5.1 mEq/L  Chloride 97 96 - 112 mEq/L   CO2 26 19 - 32 mEq/L   Glucose, Bld 84 70 - 99 mg/dL   BUN 13 6 - 23 mg/dL   Creatinine, Ser 1.61 0.40 - 1.20 mg/dL   Calcium 9.4 8.4 - 09.6 mg/dL   GFR 04.54 >09.81 mL/min   Message to pt  [place] news!  Your sodium level continues to normalize.   Please make a lab appt and come in for a BMP in one week so we can continue to monitor this.

## 2017-06-29 ENCOUNTER — Telehealth: Payer: Self-pay

## 2017-06-29 ENCOUNTER — Encounter: Payer: Self-pay | Admitting: Family Medicine

## 2017-06-29 DIAGNOSIS — E271 Primary adrenocortical insufficiency: Secondary | ICD-10-CM

## 2017-06-29 LAB — ALDOSTERONE: Aldosterone: 5.1 ng/dL (ref 0.0–30.0)

## 2017-06-29 NOTE — Telephone Encounter (Signed)
Dr. Patsy Lageropland, I do not know how to fax a referral? Please advise, I am happy to help!

## 2017-06-29 NOTE — Addendum Note (Signed)
Addended by: Abbe AmsterdamOPLAND, JESSICA C on: 06/29/2017 10:18 AM   Modules accepted: Orders

## 2017-06-29 NOTE — Telephone Encounter (Signed)
Copied from CRM [phone]. Topic: Referral - Request >> Jun 28, 2017 10:40 AM Tamela OddiMartin, [place], NT wrote: Reason for CRM: [facility] calling from Dr Willeen CassBalan's office states that the patient daughter wanted her to go to Dr. Willeen CassBalan's  office instead of [facility]. Please fax over referral and Records.   Fax # [phone]

## 2017-06-30 ENCOUNTER — Encounter: Payer: Self-pay | Admitting: Family Medicine

## 2017-07-03 ENCOUNTER — Telehealth: Payer: Self-pay

## 2017-07-03 NOTE — Telephone Encounter (Signed)
Please call [facility] back. Referral to Dr. Talmage NapBalan is in Epic.  If she is not able to view our records we can fax over recent office visits and labs (from the last month) to her office for her review JC

## 2017-07-03 NOTE — Telephone Encounter (Signed)
Copied from CRM [id number]. Topic: Referral - Request >> Jun 28, 2017 10:40 AM Tamela OddiMartin, Don'Quashia, NT wrote: Reason for CRM: [facility] calling from Dr Willeen CassBalan's office states that the patient daughter wanted her to go to Dr. Willeen CassBalan's  office instead of [facility]. Please fax over referral and Records.   Fax # [phone] >> Jun 30, 2017 10:11 AM Cipriano BunkerLambe, Annette S wrote: Pt is seeing dr on Monday (not today) But the dr. Isidore Moosffice closes at noon and they will not set appt until received referral and demo and last visit info. Asking if can fax # [phone] before noon today

## 2017-07-07 NOTE — Telephone Encounter (Signed)
Sent fax with records to [facility].

## 2017-07-11 NOTE — Telephone Encounter (Signed)
Omika with [facility]/ Dr. Willeen CassBalan's office called in to request a copy of pt's Demographics and insurance info. She is ready to schedule pt's apt but is unable to until she receive info needed.   Please fax to: [phone]

## 2017-07-24 ENCOUNTER — Encounter: Payer: Self-pay | Admitting: Family Medicine

## 2017-07-24 DIAGNOSIS — E871 Hypo-osmolality and hyponatremia: Secondary | ICD-10-CM

## 2017-07-24 DIAGNOSIS — F320 Major depressive disorder, single episode, mild: Secondary | ICD-10-CM

## 2017-07-25 MED ORDER — FLUOXETINE HCL 10 MG PO CAPS
10.0000 mg | ORAL_CAPSULE | Freq: Every day | ORAL | 3 refills | Status: DC
Start: 2017-07-25 — End: 2018-07-13

## 2017-07-26 ENCOUNTER — Other Ambulatory Visit: Payer: Self-pay | Admitting: Obstetrics and Gynecology

## 2017-08-04 ENCOUNTER — Ambulatory Visit: Payer: BC Managed Care – PPO | Admitting: Diagnostic Neuroimaging

## 2017-08-07 ENCOUNTER — Ambulatory Visit (HOSPITAL_COMMUNITY): Payer: BC Managed Care – PPO

## 2017-08-09 ENCOUNTER — Encounter: Payer: Self-pay | Admitting: Endocrinology

## 2017-08-21 ENCOUNTER — Ambulatory Visit (HOSPITAL_COMMUNITY)
Admission: RE | Admit: 2017-08-21 | Discharge: 2017-08-21 | Disposition: A | Discharge status: Home / Self Care | Payer: BC Managed Care – PPO | Source: Ambulatory Visit | Attending: Obstetrics | Admitting: Obstetrics

## 2017-08-21 ENCOUNTER — Encounter (HOSPITAL_COMMUNITY): Payer: Self-pay

## 2017-08-21 DIAGNOSIS — M810 Age-related osteoporosis without current pathological fracture: Secondary | ICD-10-CM | POA: Diagnosis present

## 2017-08-21 MED ORDER — SODIUM CHLORIDE 0.9 % IV SOLN
INTRAVENOUS | Status: DC
  Administered 2017-08-21: 11:00:00 via INTRAVENOUS

## 2017-08-21 MED ORDER — ZOLEDRONIC ACID 5 MG/100ML IV SOLN
5.0000 mg | Freq: Once | INTRAVENOUS | Status: AC
  Administered 2017-08-21: 5 mg via INTRAVENOUS
  Filled 2017-08-21: qty 100

## 2017-08-21 NOTE — Discharge Instructions (Signed)

## 2017-09-07 ENCOUNTER — Telehealth: Payer: Self-pay | Admitting: *Deleted

## 2017-09-07 NOTE — Telephone Encounter (Signed)
Fax confirmation for Plan of Care form for PT with signature.

## 2017-09-08 ENCOUNTER — Encounter: Payer: Self-pay | Admitting: Family Medicine

## 2017-09-08 DIAGNOSIS — E871 Hypo-osmolality and hyponatremia: Secondary | ICD-10-CM

## 2017-09-11 ENCOUNTER — Encounter: Payer: Self-pay | Admitting: *Deleted

## 2017-09-11 ENCOUNTER — Other Ambulatory Visit (INDEPENDENT_AMBULATORY_CARE_PROVIDER_SITE_OTHER): Payer: BC Managed Care – PPO

## 2017-09-11 DIAGNOSIS — E871 Hypo-osmolality and hyponatremia: Secondary | ICD-10-CM

## 2017-09-11 LAB — BASIC METABOLIC PANEL
BUN: 12 mg/dL (ref 6–23)
CALCIUM: 8.6 mg/dL (ref 8.4–10.5)
CHLORIDE: 104 meq/L (ref 96–112)
CO2: 30 mEq/L (ref 19–32)
CREATININE: 1.2 mg/dL (ref 0.40–1.20)
GFR: 57.73 mL/min — AB (ref >60.00)
Glucose, Bld: 60 mg/dL — ABNORMAL LOW (ref 70–99)
Potassium: 4.4 mEq/L (ref 3.5–5.1)
Sodium: 139 mEq/L (ref 135–145)

## 2017-09-11 NOTE — Progress Notes (Signed)
Received orders for signature from [place] [facility].  Signed.  Fax confirmation received [phone]. ofv [phone].

## 2017-09-11 NOTE — Telephone Encounter (Signed)
Patient came in today for lab. Please see results.

## 2017-10-20 ENCOUNTER — Other Ambulatory Visit: Payer: Self-pay | Admitting: Family Medicine

## 2017-10-20 DIAGNOSIS — F5101 Primary insomnia: Secondary | ICD-10-CM

## 2017-11-28 ENCOUNTER — Encounter: Payer: Self-pay | Admitting: Family Medicine

## 2017-12-04 ENCOUNTER — Other Ambulatory Visit (INDEPENDENT_AMBULATORY_CARE_PROVIDER_SITE_OTHER): Payer: BC Managed Care – PPO

## 2017-12-04 ENCOUNTER — Encounter: Payer: Self-pay | Admitting: Family Medicine

## 2017-12-04 DIAGNOSIS — E871 Hypo-osmolality and hyponatremia: Secondary | ICD-10-CM

## 2017-12-04 LAB — BASIC METABOLIC PANEL
BUN: 11 mg/dL (ref 6–23)
CO2: 30 meq/L (ref 19–32)
Calcium: 8.7 mg/dL (ref 8.4–10.5)
Chloride: 102 mEq/L (ref 96–112)
Creatinine, Ser: 1.06 mg/dL (ref 0.40–1.20)
GFR: 66.56 mL/min (ref >60.00)
Glucose, Bld: 82 mg/dL (ref 70–99)
Potassium: 4.6 mEq/L (ref 3.5–5.1)
SODIUM: 138 meq/L (ref 135–145)

## 2017-12-13 ENCOUNTER — Encounter: Payer: Self-pay | Admitting: Diagnostic Neuroimaging

## 2017-12-13 ENCOUNTER — Ambulatory Visit (INDEPENDENT_AMBULATORY_CARE_PROVIDER_SITE_OTHER): Payer: BC Managed Care – PPO | Admitting: Diagnostic Neuroimaging

## 2017-12-13 VITALS — BP 104/60 | HR 68 | Ht 61.0 in | Wt 139.2 lb

## 2017-12-13 DIAGNOSIS — R269 Unspecified abnormalities of gait and mobility: Secondary | ICD-10-CM

## 2017-12-13 DIAGNOSIS — R413 Other amnesia: Secondary | ICD-10-CM

## 2017-12-13 NOTE — Progress Notes (Signed)
[facility]  PATIENT: Heidi [name] DOB: Dec 21, 1950  REFERRING CLINICIAN: J Copeland HISTORY FROM: patient  REASON FOR VISIT: follow up   HISTORICAL  CHIEF COMPLAINT:  Chief Complaint  Patient presents with  . Memory Loss    rm 6, husband- Bampia, "I think my memory is better"   MMSE 23  . Follow-up    6 month    HISTORY OF PRESENT ILLNESS:   UPDATE (12/13/17, VRP): Since last visit, doing well. Symptoms are improved. Memory is better. Dizziness is better. No alleviating or aggravating factors. Had second opinion with [facility] clinic, who felt patient likely had MCI.   UPDATE (06/13/17, VRP): 67 year old female with progressive short term memory loss and gait diff x 1-2 years. Also retired ~ 2 yrs ago, due to age. Had MRI brain and this shows atrophy and ventriculomegaly. No alleviating or aggravating factors. No clear loss of ADLs and ability to be independent outside of home, but patient no longer driving, so not known how she would do independently. Some slowness of walking and physical movements.   UPDATE 12/02/13: Since last visit, continues to do well. No more dizziness. Only some intermittent ringing in ears, mild. Overall, feels well.  PRIOR HPI (10/18/13): 67 year old female with diabetes and hypercholesterolemia, here for evaluation of dizziness and vertigo. 10/15/13 in the evening patient had sudden onset of room spinning sensation with nausea, sweating, vomiting. No headache, vision changes, pain. No numbness in the hands or feet. No weakness. No recent infections, trauma, change in medication. Patient went to urgent care on 10/16/13, had blood work and CT scan ordered, which were unremarkable. Patient symptoms improved yesterday and today are significantly better. Has been taking Zofran as needed for nausea.   REVIEW OF SYSTEMS: Full 14 system review of systems performed and negative except: depression memory loss.   ALLERGIES: Allergies    Allergen Reactions  . Ciprofloxacin Anaphylaxis    Family states this is not correct and this is not an allergy    HOME MEDICATIONS: Outpatient Medications Prior to Visit  Medication Sig Dispense Refill  . calcium-vitamin D (OSCAL WITH D) 500-200 MG-UNIT per tablet Take 1 tablet by mouth daily.    . cetirizine (ZYRTEC) 10 MG tablet Take 10 mg by mouth daily.    . COD LIVER OIL PO Take 1 capsule by mouth daily.    Marland Kitchen FLUoxetine (PROZAC) 10 MG capsule Take 1 capsule (10 mg total) by mouth daily. 90 capsule 3  . hydrocortisone (CORTEF) 10 MG tablet Take 1 tablet (10 mg total) by mouth 2 (two) times daily. 30 tablet 0  . Multiple Vitamin (MULTIVITAMIN) tablet Take 1 tablet by mouth daily.    . traZODone (DESYREL) 50 MG tablet TAKE 1/2 TO 1 (ONE-HALF TO ONE) TABLET BY MOUTH AT BEDTIME AS NEEDED FOR SLEEP 90 tablet 0  . XIIDRA 5 % SOLN Place 1 drop into both eyes daily.     . memantine (NAMENDA) 10 MG tablet Take 1 tablet (10 mg total) by mouth 2 (two) times daily. (Patient not taking: Reported on 12/13/2017) 60 tablet 12  . pantoprazole (PROTONIX) 40 MG tablet Take 1 tablet (40 mg total) by mouth daily. (Patient not taking: Reported on 12/13/2017) 30 tablet 0   No facility-administered medications prior to visit.     PAST MEDICAL HISTORY: Past Medical History:  Diagnosis Date  . Allergic rhinitis due to pollen   . Allergy   . Anemia, unspecified   . Blood transfusion  without reported diagnosis    1979  . Disorder of bone and cartilage, unspecified   . Eosinophilia   . Headache 09/24/2016  . Lymphocytosis (symptomatic)   . Osteoporosis   . Other abnormal blood chemistry   . Other and unspecified hyperlipidemia   . Pain in joint, shoulder region   . Reflux esophagitis   . Type II or unspecified type diabetes mellitus without mention of complication, uncontrolled   . Umbilical hernia without mention of obstruction or gangrene   . Unspecified disorder of liver     PAST SURGICAL  HISTORY: Past Surgical History:  Procedure Laterality Date  . CATARACT EXTRACTION, BILATERAL    . CESAREAN SECTION    . COLONOSCOPY  2015   Dr. Arlyce Dice, normal exam    FAMILY HISTORY: Family History  Problem Relation Age of Onset  . Pancreatic cancer Neg Hx   . Colon cancer Neg Hx   . Esophageal cancer Neg Hx   . Stomach cancer Neg Hx     SOCIAL HISTORY:  Social History   Socioeconomic History  . Marital status: Married    Spouse name: Eleanora Neighbor  . Number of children: 4  . Years of education: BSN  . Highest education level: Not on file  Occupational History  . Occupation: Teacher, adult education: [employer]  Social Needs  . Financial resource strain: Not on file  . Food insecurity:    Worry: Not on file    Inability: Not on file  . Transportation needs:    Medical: Not on file    Non-medical: Not on file  Tobacco Use  . Smoking status: Never Smoker  . Smokeless tobacco: Never Used  Substance and Sexual Activity  . Alcohol use: No  . Drug use: No  . Sexual activity: Yes    Partners: Male  Lifestyle  . Physical activity:    Days per week: Not on file    Minutes per session: Not on file  . Stress: Not on file  Relationships  . Social connections:    Talks on phone: Not on file    Gets together: Not on file    Attends religious service: Not on file    Active member of club or organization: Not on file    Attends meetings of clubs or organizations: Not on file    Relationship status: Not on file  . Intimate partner violence:    Fear of current or ex partner: Not on file    Emotionally abused: Not on file    Physically abused: Not on file    Forced sexual activity: Not on file  Other Topics Concern  . Not on file  Social History Narrative   Married   Never smoked   Alcohol none   Exercise walk 2-3 times week   Children 4   BS Nursing , retired           PHYSICAL EXAM  GENERAL EXAM/CONSTITUTIONAL: Vitals:  Vitals:   12/13/17 1403  BP:  104/60  Pulse: 68  Weight: 139 lb 3.2 oz (63.1 kg)  Height: 5\' 1"  (1.549 m)    Patient is in no distress; well developed, nourished and groomed; neck is supple  CARDIOVASCULAR:  Examination of carotid arteries is normal; no carotid bruits  Regular rate and rhythm, no murmurs  Examination of peripheral vascular system by observation and palpation is normal  EYES:  Ophthalmoscopic exam of optic discs and posterior segments is normal; no papilledema or  hemorrhages  MUSCULOSKELETAL:  Gait, strength, tone, movements noted in Neurologic exam below  NEUROLOGIC: MENTAL STATUS:  MMSE - Mini Mental State Exam 12/13/2017 06/13/2017  Orientation to time 4 4  Orientation to Place 4 4  Registration 3 3  Attention/ Calculation 3 2  Recall 2 1  Language- name 2 objects 2 2  Language- repeat 0 0  Language- follow 3 step command 3 3  Language- read & follow direction 1 1  Write a sentence 1 1  Copy design 0 0  Total score 23 21    awake, alert, oriented to person, place and time  recent and remote memory intact  normal attention and concentration  language fluent, comprehension intact, naming intact,   fund of knowledge appropriate  CRANIAL NERVE:   2nd - no papilledema on fundoscopic exam  2nd, 3rd, 4th, 6th - pupils equal and reactive to light, visual fields full to confrontation, extraocular muscles intact, no nystagmus  5th - facial sensation symmetric  7th - facial strength symmetric  8th - hearing intact  9th - palate elevates symmetrically, uvula midline  11th - shoulder shrug symmetric  12th - tongue protrusion midline  MOTOR:   BRADYKINESIA IN BUE  normal bulk and tone, full strength in the BUE, BLE  SENSORY:   normal and symmetric to light touch, temperature, vibration  COORDINATION:   finger-nose-finger, fine finger movements SLOW  REFLEXES:   deep tendon reflexes present and symmetric  GAIT/STATION:   narrow based gait; good arm  swing     DIAGNOSTIC DATA (LABS, IMAGING, TESTING) - I reviewed patient records, labs, notes, testing and imaging myself where available.  Lab Results  Component Value Date   WBC 4.4 06/25/2017   HGB 11.7 (L) 06/25/2017   HCT 34.1 (L) 06/25/2017   MCV 81.8 06/25/2017   PLT PLATELET CLUMPS NOTED ON SMEAR, UNABLE TO ESTIMATE 06/25/2017      Component Value Date/Time   NA 138 12/04/2017 1014   NA 140 06/25/2013 0846   NA 138 11/19/2012 1251   K 4.6 12/04/2017 1014   K 3.8 11/19/2012 1251   CL 102 12/04/2017 1014   CL 108 (H) 04/23/2012 1302   CO2 30 12/04/2017 1014   CO2 27 11/19/2012 1251   GLUCOSE 82 12/04/2017 1014   GLUCOSE 80 11/19/2012 1251   GLUCOSE 88 04/23/2012 1302   BUN 11 12/04/2017 1014   BUN 14 06/25/2013 0846   BUN 13.7 11/19/2012 1251   CREATININE 1.06 12/04/2017 1014   CREATININE 0.77 03/02/2015 1145   CREATININE 0.9 11/19/2012 1251   CALCIUM 8.7 12/04/2017 1014   CALCIUM 9.2 11/19/2012 1251   PROT 7.5 03/22/2017 1303   PROT 7.4 06/25/2013 0846   PROT 7.7 11/19/2012 1251   ALBUMIN 4.3 03/22/2017 1303   ALBUMIN 4.3 06/25/2013 0846   ALBUMIN 3.8 11/19/2012 1251   AST 24 03/22/2017 1303   AST 23 11/19/2012 1251   ALT 20 03/22/2017 1303   ALT 17 11/19/2012 1251   ALKPHOS 54 03/22/2017 1303   ALKPHOS 73 11/19/2012 1251   BILITOT 0.1 repeated (L) 03/22/2017 1303   BILITOT 0.33 11/19/2012 1251   GFRNONAA >60 06/25/2017 0605   GFRNONAA 82 03/02/2015 1145   GFRAA >60 06/25/2017 0605   GFRAA >89 03/02/2015 1145   Lab Results  Component Value Date   CHOL 228 (H) 09/07/2016   HDL 52.30 09/07/2016   LDLCALC 159 (H) 09/07/2016   TRIG 82.0 09/07/2016   CHOLHDL 4 09/07/2016  Lab Results  Component Value Date   HGBA1C 6.0 06/22/2017   Lab Results  Component Value Date   VITAMINB12 3,853 (H) 06/24/2017   Lab Results  Component Value Date   TSH 1.370 06/24/2017    10/17/13 CT HEAD [I reviewed images myself and agree with interpretation. -VRP]   1. Ventricular dilatation is again noted, slightly disproportionate to the degree of diffuse cortical atrophy. Is there any clinical suspicion of normal pressure hydrocephalus?  2. Atrophy and moderate small vessel ischemic change.  05/03/17 MRI brain [report only] 1.Moderate ventricular dilatation out of proportion to the degree of cerebral atrophy. This has been described on previous CTs of the head. This likely represents central atrophy but normal pressure hydrocephalus is not excluded. Please correlate for signs and symptoms of normal pressure hydrocephalus. 2. Chronic microvascular ischemic changes in the periventricular white matter.  3.No acute intracranial abnormality.    ASSESSMENT AND PLAN  67 y.o. year old female here with sudden positional vertigo on 10/15/13, now significantly improving.   Now with memory loss, gait diff and confusion since 2017. Improving with PT.    Ddx: MCI, dementia, NPH  1. Gait difficulty   2. Memory loss      PLAN:  MEMORY LOSS (likely MCI) - consider lumbar puncture (large volume tap) with pre / post PT evaluation; they will think about it - safety / supervision issues reviewed - caregiver resources provided - caution with driving and finances  Return if symptoms worsen or fail to improve, for return to PCP.    Suanne MarkerVIKRAM R. Nolie Bignell, MD 12/13/2017, 2:35 PM Certified in Neurology, Neurophysiology and Neuroimaging  [facility] [address], [address] [place], [place] [address] [phone]

## 2018-01-29 ENCOUNTER — Encounter: Payer: Self-pay | Admitting: Family Medicine

## 2018-01-29 ENCOUNTER — Ambulatory Visit (INDEPENDENT_AMBULATORY_CARE_PROVIDER_SITE_OTHER): Payer: Medicare HMO | Admitting: Family Medicine

## 2018-01-29 VITALS — BP 110/64 | HR 64 | Temp 98.0°F | Resp 16 | Ht 61.0 in | Wt 144.6 lb

## 2018-01-29 DIAGNOSIS — E119 Type 2 diabetes mellitus without complications: Secondary | ICD-10-CM

## 2018-01-29 DIAGNOSIS — E871 Hypo-osmolality and hyponatremia: Secondary | ICD-10-CM | POA: Diagnosis not present

## 2018-01-29 DIAGNOSIS — J029 Acute pharyngitis, unspecified: Secondary | ICD-10-CM

## 2018-01-29 DIAGNOSIS — Z13220 Encounter for screening for lipoid disorders: Secondary | ICD-10-CM

## 2018-01-29 DIAGNOSIS — E782 Mixed hyperlipidemia: Secondary | ICD-10-CM

## 2018-01-29 LAB — LIPID PANEL
CHOLESTEROL: 252 mg/dL — AB (ref 0–200)
HDL: 72.9 mg/dL (ref >39.00)
LDL CALC: 165 mg/dL — AB (ref 0–99)
NonHDL: 179.4
TRIGLYCERIDES: 70 mg/dL (ref 0.0–149.0)
Total CHOL/HDL Ratio: 3
VLDL: 14 mg/dL (ref 0.0–40.0)

## 2018-01-29 LAB — COMPREHENSIVE METABOLIC PANEL
ALBUMIN: 3.8 g/dL (ref 3.5–5.2)
ALT: 20 U/L (ref 0–35)
AST: 17 U/L (ref 0–37)
Alkaline Phosphatase: 75 U/L (ref 39–117)
BUN: 13 mg/dL (ref 6–23)
CALCIUM: 9.2 mg/dL (ref 8.4–10.5)
CHLORIDE: 103 meq/L (ref 96–112)
CO2: 29 meq/L (ref 19–32)
Creatinine, Ser: 1.01 mg/dL (ref 0.40–1.20)
GFR: 66.19 mL/min (ref >60.00)
Glucose, Bld: 82 mg/dL (ref 70–99)
Potassium: 4.3 mEq/L (ref 3.5–5.1)
Sodium: 138 mEq/L (ref 135–145)
Total Bilirubin: 0.4 mg/dL (ref 0.2–1.2)
Total Protein: 7.1 g/dL (ref 6.0–8.3)

## 2018-01-29 LAB — CBC
HEMATOCRIT: 37.2 % (ref 36.0–46.0)
HEMOGLOBIN: 12.1 g/dL (ref 12.0–15.0)
MCHC: 32.4 g/dL (ref 30.0–36.0)
MCV: 85.9 fl (ref 78.0–100.0)
PLATELETS: 236 10*3/uL (ref 150.0–400.0)
RBC: 4.34 Mil/uL (ref 3.87–5.11)
RDW: 14 % (ref 11.5–15.5)
WBC: 7.3 10*3/uL (ref 4.0–10.5)

## 2018-01-29 LAB — HEMOGLOBIN A1C: HEMOGLOBIN A1C: 5.7 % (ref 4.6–6.5)

## 2018-01-29 LAB — POCT RAPID STREP A (OFFICE): Rapid Strep A Screen: NEGATIVE

## 2018-01-29 NOTE — Patient Instructions (Signed)
It was good to see you today.  Your strep test was negative, you likely have a virus causing your sore throat.  I gave you some throat lozenges, though please let me know if you are not feeling better soon.  Please be sure to have your mammogram when you are asked to Please ask your eye doctor send me a copy of his or her report  I will be in touch with your labs as soon as possible

## 2018-01-29 NOTE — Progress Notes (Addendum)
[facility] [address], [address] [place], [place] [address] [phone] [phone]  Date:  01/29/2018   Name:  Heidi [name]   DOB:  09/01/1950   MRN:  [id number]  PCP:  Pearline Cablesopland, Jessica C, MD    Chief Complaint: Sore Throat (three days, no fever, no otc treatments tried)   History of Present Illness:  Heidi [name] is a [age] y.o. very pleasant female patient who presents with the following:  History of hyponatremia and memory loss, adrenal insufficiency, controlled diabetes-diet only She has noted a ST for about 4 days No cough or fever No sneezing She has not tried any medication for her throat except for gargling  She is otherwise feeling well She is due for diabetes labs today BP Readings from Last 3 Encounters:  01/29/18 110/64  12/13/17 104/60  08/21/17 115/76   Lab Results  Component Value Date   HGBA1C 6.0 06/22/2017   Eye exam: she has an appt this week, she will asked the eye doctor to send me the report mammo is also due I think, this [facility] She reports that she received a reminder letter when her mammogram is due, and she will be sure to go when she receives this    Patient Active Problem List   Diagnosis Date Noted  . Memory loss 06/23/2017  . Gait abnormality 06/23/2017  . Uterine prolapse 09/24/2016  . Headache 09/24/2016  . Hyponatremia 03/03/2015  . Special screening for malignant neoplasms, colon 12/10/2012  . Lymphocytosis (symptomatic)   . Disorder of bone and cartilage, unspecified   . Type II or unspecified type diabetes mellitus without mention of complication, uncontrolled   . Reflux esophagitis   . Anemia, unspecified   . Other and unspecified hyperlipidemia   . Lymphocytosis 01/16/2012  . Idiopathic eosinophilia 04/05/2011  . NONSPECIFIC ABNORMAL RESULTS LIVR FUNCTION STUDY 08/15/2008  . ANEMIA, HX OF 08/15/2008    Past Medical History:  Diagnosis Date  . Allergic  rhinitis due to pollen   . Allergy   . Anemia, unspecified   . Blood transfusion without reported diagnosis    1979  . Disorder of bone and cartilage, unspecified   . Eosinophilia   . Headache 09/24/2016  . Lymphocytosis (symptomatic)   . Osteoporosis   . Other abnormal blood chemistry   . Other and unspecified hyperlipidemia   . Pain in joint, shoulder region   . Reflux esophagitis   . Type II or unspecified type diabetes mellitus without mention of complication, uncontrolled   . Umbilical hernia without mention of obstruction or gangrene   . Unspecified disorder of liver     Past Surgical History:  Procedure Laterality Date  . CATARACT EXTRACTION, BILATERAL    . CESAREAN SECTION    . COLONOSCOPY  2015   Dr. Arlyce DiceKaplan, normal exam    Social History   Tobacco Use  . Smoking status: Never Smoker  . Smokeless tobacco: Never Used  Substance Use Topics  . Alcohol use: No  . Drug use: No    Family History  Problem Relation Age of Onset  . Pancreatic cancer Neg Hx   . Colon cancer Neg Hx   . Esophageal cancer Neg Hx   . Stomach cancer Neg Hx     Allergies  Allergen Reactions  . Ciprofloxacin Anaphylaxis    Patient states she gets "CDIFF" Family states this is not correct and this is not an allergy  Medication list has been reviewed and updated.  Current Outpatient Medications on File Prior to Visit  Medication Sig Dispense Refill  . calcium-vitamin D (OSCAL WITH D) 500-200 MG-UNIT per tablet Take 1 tablet by mouth daily.    . cetirizine (ZYRTEC) 10 MG tablet Take 10 mg by mouth daily.    . COD LIVER OIL PO Take 1 capsule by mouth daily.    Marland Kitchen FLUoxetine (PROZAC) 10 MG capsule Take 1 capsule (10 mg total) by mouth daily. 90 capsule 3  . hydrocortisone (CORTEF) 10 MG tablet Take 1 tablet (10 mg total) by mouth 2 (two) times daily. 30 tablet 0  . Multiple Vitamin (MULTIVITAMIN) tablet Take 1 tablet by mouth daily.    . pantoprazole (PROTONIX) 40 MG tablet Take 1  tablet (40 mg total) by mouth daily. 30 tablet 0  . traZODone (DESYREL) 50 MG tablet TAKE 1/2 TO 1 (ONE-HALF TO ONE) TABLET BY MOUTH AT BEDTIME AS NEEDED FOR SLEEP 90 tablet 0  . XIIDRA 5 % SOLN Place 1 drop into both eyes daily.      No current facility-administered medications on file prior to visit.     Review of Systems:  As per HPI- otherwise negative. No nausea or vomiting, no diarrhea  Physical Examination: Vitals:   01/29/18 1038  BP: 110/64  Pulse: 64  Resp: 16  Temp: 98 F (36.7 C)  SpO2: 97%   Vitals:   01/29/18 1038  Weight: 144 lb 9.6 oz (65.6 kg)  Height: 5\' 1"  (1.549 m)   Body mass index is 27.32 kg/m. Ideal Body Weight: Weight in (lb) to have BMI = 25: 132  GEN: WDWN, NAD, Non-toxic, A & O x 3, looks well, normal weight HEENT: Atraumatic, Normocephalic. Neck supple. No masses, No LAD.  Bilateral TM wnl, oropharynx normal.  PEERL,EOMI. no exudate or other oropharyngeal abnormality Ears and Nose: No external deformity. CV: RRR, No M/G/R. No JVD. No thrill. No extra heart sounds. PULM: CTA B, no wheezes, crackles, rhonchi. No retractions. No resp. distress. No accessory muscle use. EXTR: No c/c/e NEURO Normal gait.  PSYCH: Normally interactive. Conversant. Not depressed or anxious appearing.  Calm demeanor.  Foot exam is normal   Results for orders placed or performed in visit on 01/29/18  POCT rapid strep A  Result Value Ref Range   Rapid Strep A Screen Negative Negative    Assessment and Plan: Sore throat - Plan: POCT rapid strep A, CBC  Controlled type 2 diabetes mellitus without complication, without long-term current use of insulin (HCC) - Plan: CBC, Hemoglobin A1c  Hyponatremia - Plan: Comprehensive metabolic panel  Screening for hyperlipidemia - Plan: Lipid panel  Following up today, diabetes labs plan as above She also is a sore throat, but exam is normal and rapid strep negative.  Suspect this is viral.  Gave some samples of throat  lozenges today, encouraged her to let me know if not feeling better soon.  She agrees  Will plan further follow- up pending labs.   Signed Abbe Amsterdam, MD   Received her labs as below-message to patient  Results for orders placed or performed in visit on 01/29/18  CBC  Result Value Ref Range   WBC 7.3 4.0 - 10.5 K/uL   RBC 4.34 3.87 - 5.11 Mil/uL   Platelets 236.0 150.0 - 400.0 K/uL   Hemoglobin 12.1 12.0 - 15.0 g/dL   HCT 26.8 34.1 - 96.2 %   MCV 85.9 78.0 - 100.0 fl  MCHC 32.4 30.0 - 36.0 g/dL   RDW 56.8 12.7 - 51.7 %  Comprehensive metabolic panel  Result Value Ref Range   Sodium 138 135 - 145 mEq/L   Potassium 4.3 3.5 - 5.1 mEq/L   Chloride 103 96 - 112 mEq/L   CO2 29 19 - 32 mEq/L   Glucose, Bld 82 70 - 99 mg/dL   BUN 13 6 - 23 mg/dL   Creatinine, Ser 0.01 0.40 - 1.20 mg/dL   Total Bilirubin 0.4 0.2 - 1.2 mg/dL   Alkaline Phosphatase 75 39 - 117 U/L   AST 17 0 - 37 U/L   ALT 20 0 - 35 U/L   Total Protein 7.1 6.0 - 8.3 g/dL   Albumin 3.8 3.5 - 5.2 g/dL   Calcium 9.2 8.4 - 74.9 mg/dL   GFR 44.96 >75.91 mL/min  Hemoglobin A1c  Result Value Ref Range   Hgb A1c MFr Bld 5.7 4.6 - 6.5 %  Lipid panel  Result Value Ref Range   Cholesterol 252 (H) 0 - 200 mg/dL   Triglycerides 63.8 0.0 - 149.0 mg/dL   HDL 46.65 >99.35 mg/dL   VLDL 70.1 0.0 - 77.9 mg/dL   LDL Cholesterol 390 (H) 0 - 99 mg/dL   Total CHOL/HDL Ratio 3    NonHDL 179.40   POCT rapid strep A  Result Value Ref Range   Rapid Strep A Screen Negative Negative   The 10-year ASCVD risk score Denman George DC Jr., et al., 2013) is: 16.4%   Values used to calculate the score:     Age: 56 years     Sex: Female     Is Non-Hispanic African American: Yes     Diabetic: Yes     Tobacco smoker: No     Systolic Blood Pressure: 110 mmHg     Is BP treated: No     HDL Cholesterol: 72.9 mg/dL     Total Cholesterol: 252 mg/dL

## 2018-01-31 DIAGNOSIS — H40013 Open angle with borderline findings, low risk, bilateral: Secondary | ICD-10-CM | POA: Diagnosis not present

## 2018-01-31 DIAGNOSIS — H20023 Recurrent acute iridocyclitis, bilateral: Secondary | ICD-10-CM | POA: Diagnosis not present

## 2018-01-31 DIAGNOSIS — H04123 Dry eye syndrome of bilateral lacrimal glands: Secondary | ICD-10-CM | POA: Diagnosis not present

## 2018-01-31 DIAGNOSIS — H16223 Keratoconjunctivitis sicca, not specified as Sjogren's, bilateral: Secondary | ICD-10-CM | POA: Diagnosis not present

## 2018-02-04 MED ORDER — SIMVASTATIN 20 MG PO TABS
20.0000 mg | ORAL_TABLET | Freq: Every day | ORAL | 3 refills | Status: DC
Start: 2018-02-04 — End: 2019-01-16

## 2018-02-04 NOTE — Addendum Note (Signed)
Addended by: Abbe Amsterdam C on: 02/04/2018 07:22 AM   Modules accepted: Orders

## 2018-02-07 DIAGNOSIS — R69 Illness, unspecified: Secondary | ICD-10-CM | POA: Diagnosis not present

## 2018-02-28 ENCOUNTER — Other Ambulatory Visit: Payer: Self-pay | Admitting: Family Medicine

## 2018-02-28 DIAGNOSIS — F5101 Primary insomnia: Secondary | ICD-10-CM

## 2018-03-15 DIAGNOSIS — K219 Gastro-esophageal reflux disease without esophagitis: Secondary | ICD-10-CM | POA: Diagnosis not present

## 2018-03-15 DIAGNOSIS — E271 Primary adrenocortical insufficiency: Secondary | ICD-10-CM | POA: Diagnosis not present

## 2018-03-15 DIAGNOSIS — Z008 Encounter for other general examination: Secondary | ICD-10-CM | POA: Diagnosis not present

## 2018-03-15 DIAGNOSIS — R69 Illness, unspecified: Secondary | ICD-10-CM | POA: Diagnosis not present

## 2018-03-15 DIAGNOSIS — H04129 Dry eye syndrome of unspecified lacrimal gland: Secondary | ICD-10-CM | POA: Diagnosis not present

## 2018-03-15 DIAGNOSIS — Z7952 Long term (current) use of systemic steroids: Secondary | ICD-10-CM | POA: Diagnosis not present

## 2018-03-15 DIAGNOSIS — I739 Peripheral vascular disease, unspecified: Secondary | ICD-10-CM | POA: Diagnosis not present

## 2018-03-15 DIAGNOSIS — E785 Hyperlipidemia, unspecified: Secondary | ICD-10-CM | POA: Diagnosis not present

## 2018-03-15 DIAGNOSIS — G4700 Insomnia, unspecified: Secondary | ICD-10-CM | POA: Diagnosis not present

## 2018-03-15 DIAGNOSIS — J309 Allergic rhinitis, unspecified: Secondary | ICD-10-CM | POA: Diagnosis not present

## 2018-07-04 ENCOUNTER — Encounter: Payer: Self-pay | Admitting: Family Medicine

## 2018-07-04 DIAGNOSIS — R278 Other lack of coordination: Secondary | ICD-10-CM

## 2018-07-04 DIAGNOSIS — E871 Hypo-osmolality and hyponatremia: Secondary | ICD-10-CM

## 2018-07-04 DIAGNOSIS — R600 Localized edema: Secondary | ICD-10-CM

## 2018-07-08 NOTE — Addendum Note (Signed)
Addended by: Lamar Blinks C on: 07/08/2018 06:22 AM   Modules accepted: Orders

## 2018-07-09 DIAGNOSIS — Z1231 Encounter for screening mammogram for malignant neoplasm of breast: Secondary | ICD-10-CM | POA: Diagnosis not present

## 2018-07-09 DIAGNOSIS — M810 Age-related osteoporosis without current pathological fracture: Secondary | ICD-10-CM | POA: Diagnosis not present

## 2018-07-09 DIAGNOSIS — Z01419 Encounter for gynecological examination (general) (routine) without abnormal findings: Secondary | ICD-10-CM | POA: Diagnosis not present

## 2018-07-10 DIAGNOSIS — M810 Age-related osteoporosis without current pathological fracture: Secondary | ICD-10-CM | POA: Diagnosis not present

## 2018-07-13 ENCOUNTER — Other Ambulatory Visit: Payer: Self-pay | Admitting: Family Medicine

## 2018-07-13 DIAGNOSIS — F320 Major depressive disorder, single episode, mild: Secondary | ICD-10-CM

## 2018-07-16 ENCOUNTER — Other Ambulatory Visit (INDEPENDENT_AMBULATORY_CARE_PROVIDER_SITE_OTHER): Payer: Medicare HMO

## 2018-07-16 ENCOUNTER — Encounter: Payer: Self-pay | Admitting: Family Medicine

## 2018-07-16 ENCOUNTER — Other Ambulatory Visit: Payer: Medicare HMO

## 2018-07-16 DIAGNOSIS — R600 Localized edema: Secondary | ICD-10-CM | POA: Diagnosis not present

## 2018-07-16 DIAGNOSIS — E871 Hypo-osmolality and hyponatremia: Secondary | ICD-10-CM

## 2018-07-16 DIAGNOSIS — R278 Other lack of coordination: Secondary | ICD-10-CM

## 2018-07-16 LAB — COMPREHENSIVE METABOLIC PANEL
ALT: 22 U/L (ref 0–35)
AST: 18 U/L (ref 0–37)
Albumin: 4.3 g/dL (ref 3.5–5.2)
Alkaline Phosphatase: 77 U/L (ref 39–117)
BUN: 9 mg/dL (ref 6–23)
CO2: 29 mEq/L (ref 19–32)
Calcium: 9.1 mg/dL (ref 8.4–10.5)
Chloride: 105 mEq/L (ref 96–112)
Creatinine, Ser: 1.03 mg/dL (ref 0.40–1.20)
GFR: 64.62 mL/min (ref >60.00)
Glucose, Bld: 86 mg/dL (ref 70–99)
Potassium: 3.9 mEq/L (ref 3.5–5.1)
Sodium: 142 mEq/L (ref 135–145)
Total Bilirubin: 0.5 mg/dL (ref 0.2–1.2)
Total Protein: 8 g/dL (ref 6.0–8.3)

## 2018-07-16 LAB — BASIC METABOLIC PANEL
BUN: 9 mg/dL (ref 6–23)
CO2: 29 mEq/L (ref 19–32)
Calcium: 9.1 mg/dL (ref 8.4–10.5)
Chloride: 105 mEq/L (ref 96–112)
Creatinine, Ser: 1.03 mg/dL (ref 0.40–1.20)
GFR: 64.62 mL/min (ref >60.00)
Glucose, Bld: 86 mg/dL (ref 70–99)
Potassium: 3.9 mEq/L (ref 3.5–5.1)
Sodium: 142 mEq/L (ref 135–145)

## 2018-07-16 LAB — CBC
HCT: 40.8 % (ref 36.0–46.0)
Hemoglobin: 13.4 g/dL (ref 12.0–15.0)
MCHC: 32.7 g/dL (ref 30.0–36.0)
MCV: 85.8 fl (ref 78.0–100.0)
Platelets: 224 10*3/uL (ref 150.0–400.0)
RBC: 4.75 Mil/uL (ref 3.87–5.11)
RDW: 14 % (ref 11.5–15.5)
WBC: 7.1 10*3/uL (ref 4.0–10.5)

## 2018-07-16 LAB — BRAIN NATRIURETIC PEPTIDE: Pro B Natriuretic peptide (BNP): 53 pg/mL (ref 0.0–100.0)

## 2018-07-26 DIAGNOSIS — E2740 Unspecified adrenocortical insufficiency: Secondary | ICD-10-CM | POA: Diagnosis not present

## 2018-07-26 DIAGNOSIS — E236 Other disorders of pituitary gland: Secondary | ICD-10-CM | POA: Diagnosis not present

## 2018-08-01 DIAGNOSIS — H35363 Drusen (degenerative) of macula, bilateral: Secondary | ICD-10-CM | POA: Diagnosis not present

## 2018-08-01 DIAGNOSIS — R7309 Other abnormal glucose: Secondary | ICD-10-CM | POA: Diagnosis not present

## 2018-08-01 DIAGNOSIS — H35013 Changes in retinal vascular appearance, bilateral: Secondary | ICD-10-CM | POA: Diagnosis not present

## 2018-08-01 DIAGNOSIS — H40013 Open angle with borderline findings, low risk, bilateral: Secondary | ICD-10-CM | POA: Diagnosis not present

## 2018-10-11 DIAGNOSIS — E2740 Unspecified adrenocortical insufficiency: Secondary | ICD-10-CM | POA: Diagnosis not present

## 2018-10-11 DIAGNOSIS — E236 Other disorders of pituitary gland: Secondary | ICD-10-CM | POA: Diagnosis not present

## 2018-10-15 DIAGNOSIS — Z23 Encounter for immunization: Secondary | ICD-10-CM | POA: Diagnosis not present

## 2018-10-15 DIAGNOSIS — E871 Hypo-osmolality and hyponatremia: Secondary | ICD-10-CM | POA: Diagnosis not present

## 2018-10-15 DIAGNOSIS — E119 Type 2 diabetes mellitus without complications: Secondary | ICD-10-CM | POA: Diagnosis not present

## 2018-10-15 DIAGNOSIS — E236 Other disorders of pituitary gland: Secondary | ICD-10-CM | POA: Diagnosis not present

## 2018-10-15 DIAGNOSIS — E2740 Unspecified adrenocortical insufficiency: Secondary | ICD-10-CM | POA: Diagnosis not present

## 2018-10-16 ENCOUNTER — Other Ambulatory Visit: Payer: Self-pay | Admitting: Family Medicine

## 2018-10-16 DIAGNOSIS — F320 Major depressive disorder, single episode, mild: Secondary | ICD-10-CM

## 2018-12-11 ENCOUNTER — Other Ambulatory Visit: Payer: Self-pay | Admitting: Family Medicine

## 2018-12-11 DIAGNOSIS — F5101 Primary insomnia: Secondary | ICD-10-CM

## 2019-01-14 ENCOUNTER — Other Ambulatory Visit: Payer: Self-pay | Admitting: Family Medicine

## 2019-01-14 DIAGNOSIS — E782 Mixed hyperlipidemia: Secondary | ICD-10-CM

## 2019-02-01 DIAGNOSIS — H40013 Open angle with borderline findings, low risk, bilateral: Secondary | ICD-10-CM | POA: Diagnosis not present

## 2019-02-01 DIAGNOSIS — H0102A Squamous blepharitis right eye, upper and lower eyelids: Secondary | ICD-10-CM | POA: Diagnosis not present

## 2019-02-01 DIAGNOSIS — H04123 Dry eye syndrome of bilateral lacrimal glands: Secondary | ICD-10-CM | POA: Diagnosis not present

## 2019-02-01 DIAGNOSIS — H16223 Keratoconjunctivitis sicca, not specified as Sjogren's, bilateral: Secondary | ICD-10-CM | POA: Diagnosis not present

## 2019-02-11 ENCOUNTER — Ambulatory Visit: Payer: Medicare HMO | Attending: Internal Medicine

## 2019-02-11 DIAGNOSIS — Z23 Encounter for immunization: Secondary | ICD-10-CM

## 2019-02-11 NOTE — Progress Notes (Signed)
   Covid-19 Vaccination Clinic  Name:  Merin Borjon    MRN: [id number] DOB: 04/21/1950  02/11/2019  Ms. Parchment was observed post Covid-19 immunization for 15 minutes without incidence. She was provided with Vaccine Information Sheet and instruction to access the V-Safe system.   Ms. Cerino was instructed to call 911 with any severe reactions post vaccine: Marland Kitchen Difficulty breathing  . Swelling of your face and throat  . A fast heartbeat  . A bad rash all over your body  . Dizziness and weakness    Immunizations Administered    Name Date Dose VIS Date Route   Pfizer COVID-19 Vaccine 02/11/2019 11:01 AM 0.3 mL 12/14/2018 Intramuscular   Manufacturer: [employer], [employer]   Lot: [id number]   NDC: [address]-4

## 2019-03-02 ENCOUNTER — Ambulatory Visit: Payer: Medicare HMO

## 2019-03-08 ENCOUNTER — Ambulatory Visit: Payer: Medicare HMO | Attending: Internal Medicine

## 2019-03-08 DIAGNOSIS — Z23 Encounter for immunization: Secondary | ICD-10-CM | POA: Insufficient documentation

## 2019-03-08 NOTE — Progress Notes (Signed)
   Covid-19 Vaccination Clinic  Name:  Tmya Wigington    MRN: [id number] DOB: [birth year]  03/08/2019  Ms. Pfefferle was observed post Covid-19 immunization for 15 minutes without incident. She was provided with Vaccine Information Sheet and instruction to access the V-Safe system.   Ms. Skillman was instructed to call 911 with any severe reactions post vaccine: Marland Kitchen Difficulty breathing  . Swelling of face and throat  . A fast heartbeat  . A bad rash all over body  . Dizziness and weakness   Immunizations Administered    Name Date Dose VIS Date Route   Pfizer COVID-19 Vaccine 03/08/2019  8:14 AM 0.3 mL 12/14/2018 Intramuscular   Manufacturer: [employer], [employer]   Lot: [id number]   NDC: [address]-4

## 2019-03-12 DIAGNOSIS — R69 Illness, unspecified: Secondary | ICD-10-CM | POA: Diagnosis not present

## 2019-03-12 DIAGNOSIS — R5383 Other fatigue: Secondary | ICD-10-CM | POA: Diagnosis not present

## 2019-03-12 DIAGNOSIS — R2689 Other abnormalities of gait and mobility: Secondary | ICD-10-CM | POA: Diagnosis not present

## 2019-04-08 ENCOUNTER — Other Ambulatory Visit: Payer: Self-pay | Admitting: Family Medicine

## 2019-04-08 DIAGNOSIS — E782 Mixed hyperlipidemia: Secondary | ICD-10-CM

## 2019-04-09 NOTE — Telephone Encounter (Signed)
Please advise 

## 2019-04-23 ENCOUNTER — Other Ambulatory Visit: Payer: Self-pay

## 2019-04-23 NOTE — Progress Notes (Addendum)
[facility] [place], [place], [place] [address] [phone]  Date:  04/24/2019   Name:  Heidi [name]   DOB:  19-Jul-1950   MRN:  [id number]  PCP:  Darreld Mclean, MD    Chief Complaint: Diabetes   History of Present Illness:  Heidi [name] is a 69 y.o. very pleasant female patient who presents with the following:  Patient with history of well-controlled diabetes, hyponatremia, anemia, memory loss, adrenal insufficiency  Here today for follow-up visit I last saw her in January 2020 with sore throat She has not followed up in the interim due to pandemic  Needs labs today-patient is not fasting Foot exam-today Urine microalbumin-today Covid vaccine is done; she and her husband had both doses 1 dose of Shingrix as documented- pt notes that she had the 2nd dose as well  Due for tetanus She sees Dr. Chalmers Cater for her adrenal insufficiency, most recent visit was in October  She was seen by ophthalmology in January She also saw her gynecologist last July  Fluoxetine- she would like to stop taking this, she is on a very low dose and may simply stop taking it.  She is asked to let me know if she would like to restart Hydrocortisone simvastatin Lab Results  Component Value Date   HGBA1C 5.7 01/29/2018    Patient Active Problem List   Diagnosis Date Noted  . Memory loss 06/23/2017  . Gait abnormality 06/23/2017  . Uterine prolapse 09/24/2016  . Headache 09/24/2016  . Hyponatremia 03/03/2015  . Special screening for malignant neoplasms, colon 12/10/2012  . Lymphocytosis (symptomatic)   . Disorder of bone and cartilage, unspecified   . Type II or unspecified type diabetes mellitus without mention of complication, uncontrolled   . Reflux esophagitis   . Anemia, unspecified   . Other and unspecified hyperlipidemia   . Lymphocytosis 01/16/2012  . Idiopathic eosinophilia 04/05/2011  . NONSPECIFIC ABNORMAL  RESULTS LIVR FUNCTION STUDY 08/15/2008  . ANEMIA, HX OF 08/15/2008    Past Medical History:  Diagnosis Date  . Allergic rhinitis due to pollen   . Allergy   . Anemia, unspecified   . Blood transfusion without reported diagnosis    1979  . Disorder of bone and cartilage, unspecified   . Eosinophilia   . Headache 09/24/2016  . Lymphocytosis (symptomatic)   . Osteoporosis   . Other abnormal blood chemistry   . Other and unspecified hyperlipidemia   . Pain in joint, shoulder region   . Reflux esophagitis   . Type II or unspecified type diabetes mellitus without mention of complication, uncontrolled   . Umbilical hernia without mention of obstruction or gangrene   . Unspecified disorder of liver     Past Surgical History:  Procedure Laterality Date  . CATARACT EXTRACTION, BILATERAL    . CESAREAN SECTION    . COLONOSCOPY  2015   Dr. Deatra Ina, normal exam    Social History   Tobacco Use  . Smoking status: Never Smoker  . Smokeless tobacco: Never Used  Substance Use Topics  . Alcohol use: No  . Drug use: No    Family History  Problem Relation Age of Onset  . Pancreatic cancer Neg Hx   . Colon cancer Neg Hx   . Esophageal cancer Neg Hx   . Stomach cancer Neg Hx     Allergies  Allergen Reactions  . Ciprofloxacin Anaphylaxis    Patient states she  gets "CDIFF" Family states this is not correct and this is not an allergy    Medication list has been reviewed and updated.  Current Outpatient Medications on File Prior to Visit  Medication Sig Dispense Refill  . calcium-vitamin D (OSCAL WITH D) 500-200 MG-UNIT per tablet Take 1 tablet by mouth daily.    . fexofenadine (ALLEGRA) 60 MG tablet Take 60 mg by mouth 2 (two) times daily.    Marland Kitchen FLUoxetine (PROZAC) 10 MG capsule Take 1 capsule by mouth once daily 90 capsule 1  . hydrocortisone (CORTEF) 10 MG tablet Take 1 tablet (10 mg total) by mouth 2 (two) times daily. 30 tablet 0  . Multiple Vitamin (MULTIVITAMIN) tablet Take 1  tablet by mouth daily.    . traZODone (DESYREL) 50 MG tablet TAKE 1/2 TO 1 TABLET BY MOUTH AT BEDTIME FOR SLEEP 90 tablet 0  . XIIDRA 5 % SOLN Place 1 drop into both eyes daily.      No current facility-administered medications on file prior to visit.    Review of Systems:  As per HPI- otherwise negative.   Physical Examination: Vitals:   04/24/19 1133  BP: 118/81  Pulse: 70  Resp: 17  Temp: (!) 97.2 F (36.2 C)  SpO2: 96%   Vitals:   04/24/19 1133  Weight: 153 lb (69.4 kg)  Height: 5\' 1"  (1.549 m)   Body mass index is 28.91 kg/m. Ideal Body Weight: Weight in (lb) to have BMI = 25: 132  GEN: no acute distress.  Overweight, looks well  HEENT: Atraumatic, Normocephalic.  Ears and Nose: No external deformity. CV: RRR, No M/G/R. No JVD. No thrill. No extra heart sounds. PULM: CTA B, no wheezes, crackles, rhonchi. No retractions. No resp. distress. No accessory muscle use. ABD: S, NT, ND, +BS. No rebound. No HSM. EXTR: No c/c/e PSYCH: Normally interactive. Conversant.  Foot exam; normal   Assessment and Plan: Hyponatremia - Plan: Comprehensive metabolic panel  Mixed hyperlipidemia - Plan: Lipid panel, simvastatin (ZOCOR) 20 MG tablet  Controlled type 2 diabetes mellitus without complication, without long-term current use of insulin (HCC) - Plan: Comprehensive metabolic panel, Hemoglobin A1c, Microalbumin / creatinine urine ratio  Adrenal insufficiency (Addison's disease) (HCC) - Plan: CBC  Screening for thyroid disorder - Plan: TSH  Depression, major, single episode, mild (HCC)  Here today for a follow-up visit and health maintenance Labs pending as above We will check on her diabetes control Refilled her simvastatin Encouraged tetanus vaccine Patient may stop taking fluoxetine, she is already taking just 10 mg.  I have asked her to alert me if it turns out she feels better with fluoxetine, in which case I am happy to refill it Will plan further follow- up  pending labs.  Assuming all is well, plan to visit in 6 months Moderate medical decision making today This visit occurred during the SARS-CoV-2 public health emergency.  Safety protocols were in place, including screening questions prior to the visit, additional usage of staff PPE, and extensive cleaning of exam room while observing appropriate contact time as indicated for disinfecting solutions.    Signed , MD  Addendum 4/22, received her labs as below.  Message to patient  Results for orders placed or performed in visit on 04/24/19  CBC  Result Value Ref Range   WBC 7.2 4.0 - 10.5 K/uL   RBC 4.63 3.87 - 5.11 Mil/uL   Platelets 247.0 150.0 - 400.0 K/uL   Hemoglobin 13.0 12.0 - 15.0 g/dL  HCT 39.8 36.0 - 46.0 %   MCV 85.9 78.0 - 100.0 fl   MCHC 32.8 30.0 - 36.0 g/dL   RDW 98.1 19.1 - 47.8 %  Comprehensive metabolic panel  Result Value Ref Range   Sodium 137 135 - 145 mEq/L   Potassium 4.8 3.5 - 5.1 mEq/L   Chloride 102 96 - 112 mEq/L   CO2 32 19 - 32 mEq/L   Glucose, Bld 87 70 - 99 mg/dL   BUN 13 6 - 23 mg/dL   Creatinine, Ser 2.95 0.40 - 1.20 mg/dL   Total Bilirubin 0.3 0.2 - 1.2 mg/dL   Alkaline Phosphatase 82 39 - 117 U/L   AST 18 0 - 37 U/L   ALT 22 0 - 35 U/L   Total Protein 7.4 6.0 - 8.3 g/dL   Albumin 3.9 3.5 - 5.2 g/dL   GFR 62.13 (L) >08.65 mL/min   Calcium 9.2 8.4 - 10.5 mg/dL  Hemoglobin H8I  Result Value Ref Range   Hgb A1c MFr Bld 6.1 4.6 - 6.5 %  Lipid panel  Result Value Ref Range   Cholesterol 162 0 - 200 mg/dL   Triglycerides 69.6 0.0 - 149.0 mg/dL   HDL 29.52 >84.13 mg/dL   VLDL 24.4 0.0 - 01.0 mg/dL   LDL Cholesterol 92 0 - 99 mg/dL   Total CHOL/HDL Ratio 3    NonHDL 105.61   TSH  Result Value Ref Range   TSH 0.76 0.35 - 4.50 uIU/mL  Microalbumin / creatinine urine ratio  Result Value Ref Range   Microalb, Ur <0.7 0.0 - 1.9 mg/dL   Creatinine,U 27.2 mg/dL   Microalb Creat Ratio 1.6 0.0 - 30.0 mg/g

## 2019-04-23 NOTE — Patient Instructions (Addendum)
It was great to see you again today, I will be in touch with your labs as soon as possible  It appears you are due for a tetanus booster, please have this given at your drugstore at your convenience I refilled your cholesterol medication today Ok to stop taking the fluoxetine/ prozac at this time- if you find you would like to go back on it just let me know and I will refill   Assuming all is ok we can plan to visit in 6 months

## 2019-04-24 ENCOUNTER — Encounter: Payer: Self-pay | Admitting: Family Medicine

## 2019-04-24 ENCOUNTER — Ambulatory Visit (INDEPENDENT_AMBULATORY_CARE_PROVIDER_SITE_OTHER): Payer: Medicare HMO | Admitting: Family Medicine

## 2019-04-24 VITALS — BP 118/81 | HR 70 | Temp 97.2°F | Resp 17 | Ht 61.0 in | Wt 153.0 lb

## 2019-04-24 DIAGNOSIS — E782 Mixed hyperlipidemia: Secondary | ICD-10-CM

## 2019-04-24 DIAGNOSIS — E271 Primary adrenocortical insufficiency: Secondary | ICD-10-CM

## 2019-04-24 DIAGNOSIS — R69 Illness, unspecified: Secondary | ICD-10-CM | POA: Diagnosis not present

## 2019-04-24 DIAGNOSIS — Z1329 Encounter for screening for other suspected endocrine disorder: Secondary | ICD-10-CM

## 2019-04-24 DIAGNOSIS — E119 Type 2 diabetes mellitus without complications: Secondary | ICD-10-CM | POA: Diagnosis not present

## 2019-04-24 DIAGNOSIS — E871 Hypo-osmolality and hyponatremia: Secondary | ICD-10-CM | POA: Diagnosis not present

## 2019-04-24 DIAGNOSIS — F320 Major depressive disorder, single episode, mild: Secondary | ICD-10-CM

## 2019-04-24 LAB — MICROALBUMIN / CREATININE URINE RATIO
Creatinine,U: 44.9 mg/dL
Microalb Creat Ratio: 1.6 mg/g (ref 0.0–30.0)
Microalb, Ur: <0.7 mg/dL (ref 0.0–1.9)

## 2019-04-24 LAB — COMPREHENSIVE METABOLIC PANEL
ALT: 22 U/L (ref 0–35)
AST: 18 U/L (ref 0–37)
Albumin: 3.9 g/dL (ref 3.5–5.2)
Alkaline Phosphatase: 82 U/L (ref 39–117)
BUN: 13 mg/dL (ref 6–23)
CO2: 32 mEq/L (ref 19–32)
Calcium: 9.2 mg/dL (ref 8.4–10.5)
Chloride: 102 mEq/L (ref 96–112)
Creatinine, Ser: 1.1 mg/dL (ref 0.40–1.20)
GFR: 59.76 mL/min — ABNORMAL LOW (ref >60.00)
Glucose, Bld: 87 mg/dL (ref 70–99)
Potassium: 4.8 mEq/L (ref 3.5–5.1)
Sodium: 137 mEq/L (ref 135–145)
Total Bilirubin: 0.3 mg/dL (ref 0.2–1.2)
Total Protein: 7.4 g/dL (ref 6.0–8.3)

## 2019-04-24 LAB — LIPID PANEL
Cholesterol: 162 mg/dL (ref 0–200)
HDL: 56.1 mg/dL (ref >39.00)
LDL Cholesterol: 92 mg/dL (ref 0–99)
NonHDL: 105.61
Total CHOL/HDL Ratio: 3
Triglycerides: 68 mg/dL (ref 0.0–149.0)
VLDL: 13.6 mg/dL (ref 0.0–40.0)

## 2019-04-24 LAB — HEMOGLOBIN A1C: Hgb A1c MFr Bld: 6.1 % (ref 4.6–6.5)

## 2019-04-24 LAB — TSH: TSH: 0.76 u[IU]/mL (ref 0.35–4.50)

## 2019-04-24 MED ORDER — SIMVASTATIN 20 MG PO TABS: 20.0000 mg | ORAL_TABLET | Freq: Every day | ORAL | 3 refills | Status: DC

## 2019-04-25 ENCOUNTER — Encounter: Payer: Self-pay | Admitting: Family Medicine

## 2019-04-25 LAB — CBC
HCT: 39.8 % (ref 36.0–46.0)
Hemoglobin: 13 g/dL (ref 12.0–15.0)
MCHC: 32.8 g/dL (ref 30.0–36.0)
MCV: 85.9 fl (ref 78.0–100.0)
Platelets: 247 10*3/uL (ref 150.0–400.0)
RBC: 4.63 Mil/uL (ref 3.87–5.11)
RDW: 13.7 % (ref 11.5–15.5)
WBC: 7.2 10*3/uL (ref 4.0–10.5)

## 2019-06-10 ENCOUNTER — Other Ambulatory Visit: Payer: Self-pay | Admitting: Family Medicine

## 2019-06-10 DIAGNOSIS — F5101 Primary insomnia: Secondary | ICD-10-CM

## 2019-06-25 DIAGNOSIS — E785 Hyperlipidemia, unspecified: Secondary | ICD-10-CM | POA: Diagnosis not present

## 2019-06-25 DIAGNOSIS — H04129 Dry eye syndrome of unspecified lacrimal gland: Secondary | ICD-10-CM | POA: Diagnosis not present

## 2019-06-25 DIAGNOSIS — E669 Obesity, unspecified: Secondary | ICD-10-CM | POA: Diagnosis not present

## 2019-06-25 DIAGNOSIS — E119 Type 2 diabetes mellitus without complications: Secondary | ICD-10-CM | POA: Diagnosis not present

## 2019-06-25 DIAGNOSIS — M1990 Unspecified osteoarthritis, unspecified site: Secondary | ICD-10-CM | POA: Diagnosis not present

## 2019-06-25 DIAGNOSIS — E271 Primary adrenocortical insufficiency: Secondary | ICD-10-CM | POA: Diagnosis not present

## 2019-06-25 DIAGNOSIS — Z008 Encounter for other general examination: Secondary | ICD-10-CM | POA: Diagnosis not present

## 2019-06-25 DIAGNOSIS — Z7952 Long term (current) use of systemic steroids: Secondary | ICD-10-CM | POA: Diagnosis not present

## 2019-07-16 DIAGNOSIS — Z1231 Encounter for screening mammogram for malignant neoplasm of breast: Secondary | ICD-10-CM | POA: Diagnosis not present

## 2019-07-16 DIAGNOSIS — Z0000 Encounter for general adult medical examination without abnormal findings: Secondary | ICD-10-CM | POA: Diagnosis not present

## 2019-07-16 DIAGNOSIS — Z01419 Encounter for gynecological examination (general) (routine) without abnormal findings: Secondary | ICD-10-CM | POA: Diagnosis not present

## 2019-07-16 LAB — HM MAMMOGRAPHY

## 2019-07-17 ENCOUNTER — Telehealth: Payer: Self-pay | Admitting: Family Medicine

## 2019-07-17 NOTE — Telephone Encounter (Signed)
Recv'd records from [facility] forwarded 1 pages to Dr. Dorthy Cooler Copland 7/14/21fbg

## 2019-07-29 ENCOUNTER — Encounter: Payer: Self-pay | Admitting: Family Medicine

## 2019-08-20 DIAGNOSIS — U071 COVID-19: Secondary | ICD-10-CM | POA: Diagnosis not present

## 2019-08-20 DIAGNOSIS — Z20828 Contact with and (suspected) exposure to other viral communicable diseases: Secondary | ICD-10-CM | POA: Diagnosis not present

## 2019-08-20 DIAGNOSIS — J101 Influenza due to other identified influenza virus with other respiratory manifestations: Secondary | ICD-10-CM | POA: Diagnosis not present

## 2019-08-20 DIAGNOSIS — B974 Respiratory syncytial virus as the cause of diseases classified elsewhere: Secondary | ICD-10-CM | POA: Diagnosis not present

## 2019-08-30 DIAGNOSIS — H26493 Other secondary cataract, bilateral: Secondary | ICD-10-CM | POA: Diagnosis not present

## 2019-08-30 DIAGNOSIS — H40013 Open angle with borderline findings, low risk, bilateral: Secondary | ICD-10-CM | POA: Diagnosis not present

## 2019-08-30 DIAGNOSIS — H35013 Changes in retinal vascular appearance, bilateral: Secondary | ICD-10-CM | POA: Diagnosis not present

## 2019-08-30 DIAGNOSIS — H35363 Drusen (degenerative) of macula, bilateral: Secondary | ICD-10-CM | POA: Diagnosis not present

## 2019-09-23 DIAGNOSIS — R69 Illness, unspecified: Secondary | ICD-10-CM | POA: Diagnosis not present

## 2019-09-24 ENCOUNTER — Encounter: Payer: Self-pay | Admitting: Family Medicine

## 2019-09-24 DIAGNOSIS — E871 Hypo-osmolality and hyponatremia: Secondary | ICD-10-CM

## 2019-09-25 ENCOUNTER — Other Ambulatory Visit: Payer: Self-pay

## 2019-09-25 ENCOUNTER — Encounter: Payer: Self-pay | Admitting: Family Medicine

## 2019-09-25 ENCOUNTER — Other Ambulatory Visit (INDEPENDENT_AMBULATORY_CARE_PROVIDER_SITE_OTHER): Payer: Medicare HMO

## 2019-09-25 DIAGNOSIS — E871 Hypo-osmolality and hyponatremia: Secondary | ICD-10-CM | POA: Diagnosis not present

## 2019-09-25 LAB — BASIC METABOLIC PANEL
BUN/Creatinine Ratio: 15 (calc) (ref 6–22)
BUN: 17 mg/dL (ref 7–25)
CO2: 29 mmol/L (ref 20–32)
Calcium: 9 mg/dL (ref 8.6–10.4)
Chloride: 104 mmol/L (ref 98–110)
Creat: 1.1 mg/dL — ABNORMAL HIGH (ref 0.50–0.99)
Glucose, Bld: 85 mg/dL (ref 65–99)
Potassium: 4.1 mmol/L (ref 3.5–5.3)
Sodium: 138 mmol/L (ref 135–146)

## 2019-10-01 NOTE — Patient Instructions (Addendum)
It was great to see you again today as always, I will be in touch with your A1c and blood counts as soon as possible You got your flu shot today Please be sure to get your covid booster before your upcoming trip  We ordered a bone density scan for you today   Please see me in about 6 months

## 2019-10-01 NOTE — Progress Notes (Addendum)
[facility] at [facility] [address] [place], [place] [phone]  Date:  10/03/2019   Name:  Heidi [name]   DOB:  May 08, 1950   MRN:  [id number]  PCP:  Pearline Cables, MD    Chief Complaint: Follow-up (Lab work, flu shot ) and Diabetes   History of Present Illness:  Heidi [name] is a 69 y.o. very pleasant female patient who presents with the following:  Patient here today for follow-up visit- history of well-controlled diabetes, hyponatremia, anemia, memory loss, adrenal insufficiency Last seen by myself in April She has tended towards hyponatremia which is sometimes symptomatic.  We checked a BMP for her earlier this month, sodium normal She notes right shoulder pain due to bursitis- she reports seeing orthopedics several years ago and having a steroid injection which helped for quite some time.  She does not remember who she saw for orthopedics previously.  She would like a referral to Ortho again  She is seen by endocrinology, Dr. Talmage Nap for adrenal insufficiency  She takes fluoxetine, Cortef 10 mg, simvastatin, trazodone  Eye exam- pt reports visit with DR Elmer Picker about 2 months ago  Flu vaccine- give today  Due for A1c- she is not fasting today  Mammo, colonoscopy up-to-date Can offer DEXA scan Covid series is complete, needs booster- recommended this prior to her upcoming trip  She plans to visit [place] in November to see her family  Lab Results  Component Value Date   HGBA1C 6.1 04/24/2019    Patient Active Problem List   Diagnosis Date Noted  . Memory loss 06/23/2017  . Gait abnormality 06/23/2017  . Uterine prolapse 09/24/2016  . Headache 09/24/2016  . Hyponatremia 03/03/2015  . Special screening for malignant neoplasms, colon 12/10/2012  . Lymphocytosis (symptomatic)   . Disorder of bone and cartilage, unspecified   . Type II or unspecified type diabetes mellitus without mention of complication,  uncontrolled   . Reflux esophagitis   . Anemia, unspecified   . Other and unspecified hyperlipidemia   . Lymphocytosis 01/16/2012  . Idiopathic eosinophilia 04/05/2011  . NONSPECIFIC ABNORMAL RESULTS LIVR FUNCTION STUDY 08/15/2008  . ANEMIA, HX OF 08/15/2008    Past Medical History:  Diagnosis Date  . Allergic rhinitis due to pollen   . Allergy   . Anemia, unspecified   . Blood transfusion without reported diagnosis    1979  . Disorder of bone and cartilage, unspecified   . Eosinophilia   . Headache 09/24/2016  . Lymphocytosis (symptomatic)   . Osteoporosis   . Other abnormal blood chemistry   . Other and unspecified hyperlipidemia   . Pain in joint, shoulder region   . Reflux esophagitis   . Type II or unspecified type diabetes mellitus without mention of complication, uncontrolled   . Umbilical hernia without mention of obstruction or gangrene   . Unspecified disorder of liver     Past Surgical History:  Procedure Laterality Date  . CATARACT EXTRACTION, BILATERAL    . CESAREAN SECTION    . COLONOSCOPY  2015   Dr. Arlyce Dice, normal exam    Social History   Tobacco Use  . Smoking status: Never Smoker  . Smokeless tobacco: Never Used  Substance Use Topics  . Alcohol use: No  . Drug use: No    Family History  Problem Relation Age of Onset  . Pancreatic cancer Neg Hx   . Colon cancer Neg Hx   . Esophageal  cancer Neg Hx   . Stomach cancer Neg Hx     Allergies  Allergen Reactions  . Ciprofloxacin Anaphylaxis    Patient states she gets "CDIFF" Family states this is not correct and this is not an allergy    Medication list has been reviewed and updated.  Current Outpatient Medications on File Prior to Visit  Medication Sig Dispense Refill  . calcium-vitamin D (OSCAL WITH D) 500-200 MG-UNIT per tablet Take 1 tablet by mouth daily.    . fexofenadine (ALLEGRA) 60 MG tablet Take 60 mg by mouth 2 (two) times daily.    . hydrocortisone (CORTEF) 10 MG tablet Take 1  tablet (10 mg total) by mouth 2 (two) times daily. 30 tablet 0  . Multiple Vitamin (MULTIVITAMIN) tablet Take 1 tablet by mouth daily.    . simvastatin (ZOCOR) 20 MG tablet Take 1 tablet (20 mg total) by mouth at bedtime. 90 tablet 3  . traZODone (DESYREL) 50 MG tablet TAKE 1/2 TO 1 (ONE-HALF TO ONE) TABLET BY MOUTH AT BEDTIME FOR SLEEP 90 tablet 1  . XIIDRA 5 % SOLN Place 1 drop into both eyes daily.      No current facility-administered medications on file prior to visit.    Review of Systems:  As per HPI- otherwise negative.   Physical Examination: Vitals:   10/03/19 0922  BP: 124/70  Pulse: 90  Resp: 15  SpO2: 99%   Vitals:   10/03/19 0922  Weight: 157 lb (71.2 kg)  Height: 5\' 1"  (1.549 m)   Body mass index is 29.66 kg/m. Ideal Body Weight: Weight in (lb) to have BMI = 25: 132  GEN: no acute distress.  Overweight, looks well HEENT: Atraumatic, Normocephalic.   Bilateral TM wnl, oropharynx normal.  PEERL,EOMI.   Ears and Nose: No external deformity. CV: RRR, No M/G/R. No JVD. No thrill. No extra heart sounds. PULM: CTA B, no wheezes, crackles, rhonchi. No retractions. No resp. distress. No accessory muscle use. ABD: S, NT, ND, +BS. No rebound. No HSM. EXTR: No c/c/e PSYCH: Normally interactive. Conversant.  Foot exam today Right shoulder displays normal range of motion  Assessment and Plan: Hyponatremia  Mixed hyperlipidemia  Controlled type 2 diabetes mellitus without complication, without long-term current use of insulin (HCC) - Plan: Hemoglobin A1c  Adrenal insufficiency (Addison's disease) (HCC) - Plan: CBC  Estrogen deficiency - Plan: DG Bone Density  Chronic right shoulder pain - Plan: Ambulatory referral to Orthopedic Surgery  Routine labs pending as above Given flu shot Order DEXA Recommend Covid booster prior to her month long trip to visit family in this coming November Referral to orthopedics to evaluate right shoulder pain Will plan  further follow- up pending labs. Plan to visit in about 6 months This visit occurred during the SARS-CoV-2 public health emergency.  Safety protocols were in place, including screening questions prior to the visit, additional usage of staff PPE, and extensive cleaning of exam room while observing appropriate contact time as indicated for disinfecting solutions.    Signed December, MD  Addendum 10/1, received her labs as below-message to patient  Results for orders placed or performed in visit on 10/03/19  Hemoglobin A1c  Result Value Ref Range   Hgb A1c MFr Bld 6.1 (H) <5.7 % of total Hgb   Mean Plasma Glucose 128 (calc)   eAG (mmol/L) 7.1 (calc)  CBC  Result Value Ref Range   WBC 6.5 3.8 - 10.8 Thousand/uL   RBC 4.76 3.80 -  5.10 Million/uL   Hemoglobin 13.2 11.7 - 15.5 g/dL   HCT 23.5 35 - 45 %   MCV 84.5 80.0 - 100.0 fL   MCH 27.7 27.0 - 33.0 pg   MCHC 32.8 32.0 - 36.0 g/dL   RDW 57.3 22.0 - 25.4 %   Platelets 288 140 - 400 Thousand/uL   MPV 10.9 7.5 - 12.5 fL

## 2019-10-03 ENCOUNTER — Encounter: Payer: Self-pay | Admitting: Family Medicine

## 2019-10-03 ENCOUNTER — Ambulatory Visit (INDEPENDENT_AMBULATORY_CARE_PROVIDER_SITE_OTHER): Payer: Medicare HMO | Admitting: Family Medicine

## 2019-10-03 ENCOUNTER — Ambulatory Visit (HOSPITAL_BASED_OUTPATIENT_CLINIC_OR_DEPARTMENT_OTHER)
Admission: RE | Admit: 2019-10-03 | Discharge: 2019-10-03 | Disposition: A | Discharge status: Home / Self Care | Payer: Medicare HMO | Source: Ambulatory Visit | Attending: Family Medicine | Admitting: Family Medicine

## 2019-10-03 ENCOUNTER — Other Ambulatory Visit: Payer: Self-pay

## 2019-10-03 VITALS — BP 124/70 | HR 90 | Resp 15 | Ht 61.0 in | Wt 157.0 lb

## 2019-10-03 DIAGNOSIS — E2839 Other primary ovarian failure: Secondary | ICD-10-CM

## 2019-10-03 DIAGNOSIS — R29890 Loss of height: Secondary | ICD-10-CM | POA: Diagnosis not present

## 2019-10-03 DIAGNOSIS — E782 Mixed hyperlipidemia: Secondary | ICD-10-CM | POA: Diagnosis not present

## 2019-10-03 DIAGNOSIS — G8929 Other chronic pain: Secondary | ICD-10-CM

## 2019-10-03 DIAGNOSIS — M25511 Pain in right shoulder: Secondary | ICD-10-CM | POA: Diagnosis not present

## 2019-10-03 DIAGNOSIS — E119 Type 2 diabetes mellitus without complications: Secondary | ICD-10-CM

## 2019-10-03 DIAGNOSIS — E271 Primary adrenocortical insufficiency: Secondary | ICD-10-CM

## 2019-10-03 DIAGNOSIS — E871 Hypo-osmolality and hyponatremia: Secondary | ICD-10-CM | POA: Diagnosis not present

## 2019-10-03 DIAGNOSIS — Z23 Encounter for immunization: Secondary | ICD-10-CM | POA: Diagnosis not present

## 2019-10-03 DIAGNOSIS — M8589 Other specified disorders of bone density and structure, multiple sites: Secondary | ICD-10-CM | POA: Diagnosis not present

## 2019-10-03 DIAGNOSIS — M8580 Other specified disorders of bone density and structure, unspecified site: Secondary | ICD-10-CM | POA: Insufficient documentation

## 2019-10-03 NOTE — Addendum Note (Signed)
Addended by: Steve Rattler A on: 10/03/2019 09:53 AM   Modules accepted: Orders

## 2019-10-04 ENCOUNTER — Encounter: Payer: Self-pay | Admitting: Family Medicine

## 2019-10-04 LAB — CBC
HCT: 40.2 % (ref 35.0–45.0)
Hemoglobin: 13.2 g/dL (ref 11.7–15.5)
MCH: 27.7 pg (ref 27.0–33.0)
MCHC: 32.8 g/dL (ref 32.0–36.0)
MCV: 84.5 fL (ref 80.0–100.0)
MPV: 10.9 fL (ref 7.5–12.5)
Platelets: 288 10*3/uL (ref 140–400)
RBC: 4.76 10*6/uL (ref 3.80–5.10)
RDW: 12.6 % (ref 11.0–15.0)
WBC: 6.5 10*3/uL (ref 3.8–10.8)

## 2019-10-04 LAB — HEMOGLOBIN A1C
Hgb A1c MFr Bld: 6.1 % of total Hgb — ABNORMAL HIGH (ref <5.7)
Mean Plasma Glucose: 128 (calc)
eAG (mmol/L): 7.1 (calc)

## 2019-10-08 DIAGNOSIS — M810 Age-related osteoporosis without current pathological fracture: Secondary | ICD-10-CM | POA: Diagnosis not present

## 2019-10-08 DIAGNOSIS — E119 Type 2 diabetes mellitus without complications: Secondary | ICD-10-CM | POA: Diagnosis not present

## 2019-10-08 DIAGNOSIS — E2740 Unspecified adrenocortical insufficiency: Secondary | ICD-10-CM | POA: Diagnosis not present

## 2019-10-08 DIAGNOSIS — E236 Other disorders of pituitary gland: Secondary | ICD-10-CM | POA: Diagnosis not present

## 2019-10-09 ENCOUNTER — Ambulatory Visit: Payer: Self-pay

## 2019-10-09 ENCOUNTER — Other Ambulatory Visit: Payer: Self-pay | Admitting: Family Medicine

## 2019-10-09 ENCOUNTER — Encounter: Payer: Self-pay | Admitting: Orthopaedic Surgery

## 2019-10-09 ENCOUNTER — Ambulatory Visit: Payer: Medicare HMO | Admitting: Orthopaedic Surgery

## 2019-10-09 VITALS — BP 123/74 | HR 72 | Ht 61.42 in | Wt 157.0 lb

## 2019-10-09 DIAGNOSIS — G8929 Other chronic pain: Secondary | ICD-10-CM

## 2019-10-09 DIAGNOSIS — F5101 Primary insomnia: Secondary | ICD-10-CM

## 2019-10-09 DIAGNOSIS — M7542 Impingement syndrome of left shoulder: Secondary | ICD-10-CM

## 2019-10-09 DIAGNOSIS — M25512 Pain in left shoulder: Secondary | ICD-10-CM

## 2019-10-09 MED ORDER — METHYLPREDNISOLONE ACETATE 40 MG/ML IJ SUSP
40.0000 mg | INTRAMUSCULAR | Status: AC | PRN
  Administered 2019-10-09: 40 mg via INTRA_ARTICULAR

## 2019-10-09 MED ORDER — BUPIVACAINE HCL 0.25 % IJ SOLN
4.0000 mL | INTRAMUSCULAR | Status: AC | PRN
  Administered 2019-10-09: 4 mL via INTRA_ARTICULAR

## 2019-10-09 MED ORDER — LIDOCAINE HCL 1 % IJ SOLN
0.5000 mL | INTRAMUSCULAR | Status: AC | PRN
  Administered 2019-10-09: .5 mL

## 2019-10-09 NOTE — Progress Notes (Signed)
Office Visit Note   Patient: Heidi [name]           Date of Birth: July 05, 1950           MRN: [id number] Visit Date: 10/09/2019              Requested by: Heidi Cables, MD [address] [place],  [place] [address] PCP: Heidi Cables, MD   Assessment & Plan: Visit Diagnoses:  1. Chronic left shoulder pain   2. Impingement syndrome of left shoulder     Plan: Post injection she got some good relief.  She has been on ibuprofen 800 which she can continue short-term.  She will call if she is having persistent problems we can consider diagnostic imaging if she does not get relief.  Follow-Up Instructions: No follow-ups on file.   Orders:  Orders Placed This Encounter  Procedures  . Large Joint Inj: L subacromial bursa  . XR Shoulder Left   No orders of the defined types were placed in this encounter.     Procedures: Large Joint Inj: L subacromial bursa on 10/09/2019 9:50 AM Indications: pain Details: 22 G 1.5 in needle  Arthrogram: No  Medications: 4 mL bupivacaine 0.25 %; 40 mg methylPREDNISolone acetate 40 MG/ML; 0.5 mL lidocaine 1 % Outcome: tolerated well, no immediate complications Procedure, treatment alternatives, risks and benefits explained, specific risks discussed. Consent was given by the patient. Immediately prior to procedure a time out was called to verify the correct patient, procedure, equipment, support staff and site/side marked as required. Patient was prepped and draped in the usual sterile fashion.       Clinical Data: No additional findings.   Subjective: Chief Complaint  Patient presents with  . Left Shoulder - Pain    HPI 69 year old female returns with recurrent problems with left shoulder pain decreased range of motion.  No fall or injury.  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  No numbness or tingling in her hand.  She has had previous history of subacromial injection for her shoulder which worked  extremely well this has not been done in several years.  No fever chills no night sweats.  Review of Systems patient does have history of anemia, GI reflux type 2 diabetes otherwise noncontributory to HPI all other systems.   Objective: Vital Signs: BP 123/74 (BP Location: Right Arm, Patient Position: Sitting, Cuff Size: Normal)   Pulse 72   Ht 5' 1.42" (1.56 m)   Wt 157 lb (71.2 kg)   BMI 29.26 kg/m   Physical Exam Constitutional:      Appearance: She is well-developed.  HENT:     Head: Normocephalic.     Right Ear: External ear normal.     Left Ear: External ear normal.  Eyes:     Pupils: Pupils are equal, round, and reactive to light.  Neck:     Thyroid: No thyromegaly.     Trachea: No tracheal deviation.  Cardiovascular:     Rate and Rhythm: Normal rate.  Pulmonary:     Effort: Pulmonary effort is normal.  Abdominal:     Palpations: Abdomen is soft.  Skin:    General: Skin is warm and dry.  Neurological:     Mental Status: She is alert and oriented to person, place, and time.  Psychiatric:        Behavior: Behavior normal.     Ortho Exam good cervical range of  motion negative Spurling.  Upper extremity reflexes are 2+ and symmetrical.  Positive impingement left shoulder negative right shoulder.  Long head of the biceps tendon on the left negative on the right no subluxation of the biceps tendon.  She has some pain with the resisted abduction.  She lacks 10 degrees reaching full flexion and abduction. Specialty Comments:  No specialty comments available.  Imaging: XR Shoulder Left  Result Date: 10/09/2019 Three-view x-rays left shoulder obtained and reviewed.  This shows some sclerosis of the greater tuberosity rotator cuff insertion site.  No glenohumeral arthritis minimal acromioclavicular changes no soft tissue calcification. Impression: Left shoulder negative for acute changes.    PMFS History: Patient Active Problem List   Diagnosis Date Noted  .  Impingement syndrome of left shoulder 10/09/2019  . Osteopenia 10/03/2019  . Memory loss 06/23/2017  . Gait abnormality 06/23/2017  . Uterine prolapse 09/24/2016  . Headache 09/24/2016  . Hyponatremia 03/03/2015  . Special screening for malignant neoplasms, colon 12/10/2012  . Lymphocytosis (symptomatic)   . Disorder of bone and cartilage, unspecified   . Type II or unspecified type diabetes mellitus without mention of complication, uncontrolled   . Reflux esophagitis   . Anemia, unspecified   . Other and unspecified hyperlipidemia   . Lymphocytosis 01/16/2012  . Idiopathic eosinophilia 04/05/2011  . NONSPECIFIC ABNORMAL RESULTS LIVR FUNCTION STUDY 08/15/2008  . ANEMIA, HX OF 08/15/2008   Past Medical History:  Diagnosis Date  . Allergic rhinitis due to pollen   . Allergy   . Anemia, unspecified   . Blood transfusion without reported diagnosis    1979  . Disorder of bone and cartilage, unspecified   . Eosinophilia   . Headache 09/24/2016  . Lymphocytosis (symptomatic)   . Osteoporosis   . Other abnormal blood chemistry   . Other and unspecified hyperlipidemia   . Pain in joint, shoulder region   . Reflux esophagitis   . Type II or unspecified type diabetes mellitus without mention of complication, uncontrolled   . Umbilical hernia without mention of obstruction or gangrene   . Unspecified disorder of liver     Family History  Problem Relation Age of Onset  . Pancreatic cancer Neg Hx   . Colon cancer Neg Hx   . Esophageal cancer Neg Hx   . Stomach cancer Neg Hx     Past Surgical History:  Procedure Laterality Date  . CATARACT EXTRACTION, BILATERAL    . CESAREAN SECTION    . COLONOSCOPY  2015   Dr. Arlyce [name], normal exam   Social History   Occupational History  . Occupation: Teacher, adult education: [employer]  Tobacco Use  . Smoking status: Never Smoker  . Smokeless tobacco: Never Used  Substance and Sexual Activity  . Alcohol use: No  . Drug use: No  .  Sexual activity: Yes    Partners: Male

## 2019-10-15 DIAGNOSIS — E119 Type 2 diabetes mellitus without complications: Secondary | ICD-10-CM | POA: Diagnosis not present

## 2019-10-15 DIAGNOSIS — E559 Vitamin D deficiency, unspecified: Secondary | ICD-10-CM | POA: Diagnosis not present

## 2019-10-15 DIAGNOSIS — E871 Hypo-osmolality and hyponatremia: Secondary | ICD-10-CM | POA: Diagnosis not present

## 2019-10-15 DIAGNOSIS — G919 Hydrocephalus, unspecified: Secondary | ICD-10-CM | POA: Diagnosis not present

## 2019-10-15 DIAGNOSIS — E236 Other disorders of pituitary gland: Secondary | ICD-10-CM | POA: Diagnosis not present

## 2019-10-15 DIAGNOSIS — E2740 Unspecified adrenocortical insufficiency: Secondary | ICD-10-CM | POA: Diagnosis not present

## 2019-10-15 DIAGNOSIS — M8580 Other specified disorders of bone density and structure, unspecified site: Secondary | ICD-10-CM | POA: Diagnosis not present

## 2019-11-19 DIAGNOSIS — M810 Age-related osteoporosis without current pathological fracture: Secondary | ICD-10-CM | POA: Diagnosis not present

## 2019-11-23 DIAGNOSIS — Z20822 Contact with and (suspected) exposure to covid-19: Secondary | ICD-10-CM | POA: Diagnosis not present

## 2019-11-25 DIAGNOSIS — Z20822 Contact with and (suspected) exposure to covid-19: Secondary | ICD-10-CM | POA: Diagnosis not present

## 2019-11-25 DIAGNOSIS — Z0000 Encounter for general adult medical examination without abnormal findings: Secondary | ICD-10-CM | POA: Diagnosis not present

## 2020-02-19 ENCOUNTER — Other Ambulatory Visit: Payer: Self-pay

## 2020-02-19 ENCOUNTER — Emergency Department (HOSPITAL_BASED_OUTPATIENT_CLINIC_OR_DEPARTMENT_OTHER)
Admission: EM | Admit: 2020-02-19 | Discharge: 2020-02-19 | Disposition: A | Discharge status: Home / Self Care | Payer: Medicare HMO | Attending: Emergency Medicine | Admitting: Emergency Medicine

## 2020-02-19 ENCOUNTER — Encounter (HOSPITAL_BASED_OUTPATIENT_CLINIC_OR_DEPARTMENT_OTHER): Payer: Self-pay

## 2020-02-19 DIAGNOSIS — W010XXA Fall on same level from slipping, tripping and stumbling without subsequent striking against object, initial encounter: Secondary | ICD-10-CM | POA: Insufficient documentation

## 2020-02-19 DIAGNOSIS — S0990XA Unspecified injury of head, initial encounter: Secondary | ICD-10-CM | POA: Insufficient documentation

## 2020-02-19 DIAGNOSIS — E119 Type 2 diabetes mellitus without complications: Secondary | ICD-10-CM | POA: Insufficient documentation

## 2020-02-19 DIAGNOSIS — Y92094 Garage of other non-institutional residence as the place of occurrence of the external cause: Secondary | ICD-10-CM | POA: Insufficient documentation

## 2020-02-19 DIAGNOSIS — W19XXXA Unspecified fall, initial encounter: Secondary | ICD-10-CM

## 2020-02-19 NOTE — ED Triage Notes (Signed)
Pt states she lost her balance/fell-pain to right temporal area-no break in skin-no LOC-NAD-steady gait

## 2020-02-19 NOTE — ED Notes (Signed)
Patient verbalizes understanding of discharge instructions. Opportunity for questioning and answers were provided. Armband removed by staff, pt discharged from ED ambulatory to home.  

## 2020-02-19 NOTE — Discharge Instructions (Signed)
As we discussed, your exam was reassuring.  Please follow-up with your primary care doctor in the next 24 to 48 hours.  Return to emergency department for any vomiting, difficulty speaking, difficulty moving arms or legs, difficulty walking, any other worsening or worrisome or concerning symptoms.

## 2020-02-19 NOTE — ED Provider Notes (Signed)
[facility] EMERGENCY DEPARTMENT Provider Note   CSN: [id number] Arrival date & time: 02/19/20  1952     History Chief Complaint  Patient presents with  . Fall    Heidi [name] is a 70 y.o. female who presents for evaluation of head injury that occurred at 38 PM this afternoon after a mechanical fall.  Patient reports that she was in the garage states that she lost her balance and tripped and fell. No chest pain that preceded fall. She reports hitting the right frontal/right side of her head on the wall.  She did not fall down and hit the ground.  No LOC.  She is not on blood thinners.  She reports she has been at her baseline since this happened.  Husband states that there have not been any changes to her behavior.  They called their daughter who wanted her to come get evaluated in the emergency department.  Patient states that she has not had any difficulty ambulating.  She denies any vision changes, numbness/weakness of her arms/legs, nausea/vomiting.  The history is provided by the patient.       Past Medical History:  Diagnosis Date  . Allergic rhinitis due to pollen   . Allergy   . Anemia, unspecified   . Blood transfusion without reported diagnosis    1979  . Disorder of bone and cartilage, unspecified   . Eosinophilia   . Headache 09/24/2016  . Lymphocytosis (symptomatic)   . Osteoporosis   . Other abnormal blood chemistry   . Other and unspecified hyperlipidemia   . Pain in joint, shoulder region   . Reflux esophagitis   . Type II or unspecified type diabetes mellitus without mention of complication, uncontrolled   . Umbilical hernia without mention of obstruction or gangrene   . Unspecified disorder of liver     Patient Active Problem List   Diagnosis Date Noted  . Impingement syndrome of left shoulder 10/09/2019  . Osteopenia 10/03/2019  . Memory loss 06/23/2017  . Gait abnormality 06/23/2017  . Uterine prolapse 09/24/2016  . Headache  09/24/2016  . Hyponatremia 03/03/2015  . Special screening for malignant neoplasms, colon 12/10/2012  . Lymphocytosis (symptomatic)   . Disorder of bone and cartilage, unspecified   . Type II or unspecified type diabetes mellitus without mention of complication, uncontrolled   . Reflux esophagitis   . Anemia, unspecified   . Other and unspecified hyperlipidemia   . Lymphocytosis 01/16/2012  . Idiopathic eosinophilia 04/05/2011  . NONSPECIFIC ABNORMAL RESULTS LIVR FUNCTION STUDY 08/15/2008  . ANEMIA, HX OF 08/15/2008    Past Surgical History:  Procedure Laterality Date  . CATARACT EXTRACTION, BILATERAL    . CESAREAN SECTION    . COLONOSCOPY  2015   Dr. Arlyce Dice, normal exam     OB History    Gravida  5   Para  4   Term  4   Preterm      AB  1   Living        SAB      IAB      Ectopic      Multiple      Live Births  4           Family History  Problem Relation Age of Onset  . Pancreatic cancer Neg Hx   . Colon cancer Neg Hx   . Esophageal cancer Neg Hx   . Stomach cancer Neg Hx     Social History  Tobacco Use  . Smoking status: Never Smoker  . Smokeless tobacco: Never Used  Substance Use Topics  . Alcohol use: No  . Drug use: No    Home Medications Prior to Admission medications   Medication Sig Start Date End Date Taking? Authorizing Provider  calcium-vitamin D (OSCAL WITH D) 500-200 MG-UNIT per tablet Take 1 tablet by mouth daily.    [provider]  fexofenadine (ALLEGRA) 60 MG tablet Take 60 mg by mouth 2 (two) times daily.    [provider]  hydrocortisone (CORTEF) 10 MG tablet Take 1 tablet (10 mg total) by mouth 2 (two) times daily. 06/25/17   Elgergawy, Leana Roe, MD  Multiple Vitamin (MULTIVITAMIN) tablet Take 1 tablet by mouth daily.    [provider]  simvastatin (ZOCOR) 20 MG tablet Take 1 tablet (20 mg total) by mouth at bedtime. 04/24/19   Copland, Gwenlyn Found, MD  traZODone (DESYREL) 50 MG tablet TAKE  1/2 TO 1 (ONE-HALF TO ONE) TABLET BY MOUTH AT BEDTIME FOR SLEEP 10/09/19   Copland, Gwenlyn Found, MD  XIIDRA 5 % SOLN Place 1 drop into both eyes daily.  03/21/15   [provider]    Allergies    Ciprofloxacin  Review of Systems   Review of Systems  Eyes: Negative for visual disturbance.  Gastrointestinal: Negative for nausea and vomiting.  Musculoskeletal: Negative for neck pain.  Neurological: Negative for weakness and numbness.  All other systems reviewed and are negative.   Physical Exam Updated Vital Signs BP (!) 168/90 (BP Location: Left Arm)   Pulse 66   Temp 98.6 F (37 C) (Oral)   Resp 16   SpO2 98%   Physical Exam Vitals and nursing note reviewed.  Constitutional:      Appearance: She is well-developed and well-nourished.  HENT:     Head: Normocephalic. No raccoon eyes or Battle's sign.      Comments: Mild soft tissue swelling. No hematoma. No underlying skull deformity or crepitus noted.     Right Ear: No hemotympanum.     Left Ear: No hemotympanum.  Eyes:     General: No scleral icterus.       Right eye: No discharge.        Left eye: No discharge.     Extraocular Movements: EOM normal.     Conjunctiva/sclera: Conjunctivae normal.     Comments: PERRL. EOMs intact. No nystagmus. No neglect.   Neck:     Comments: Full flexion/extension and lateral movement of neck fully intact. No bony midline tenderness. No deformities or crepitus.  Pulmonary:     Effort: Pulmonary effort is normal.  Skin:    General: Skin is warm and dry.  Neurological:     Mental Status: She is alert.     Comments: Cranial nerves III-XII intact Follows commands, Moves all extremities  5/5 strength to BUE and BLE  Sensation intact throughout all major nerve distributions Normal finger to nose No pronator drift. No gait abnormalities  No slurred speech. No facial droop.   Psychiatric:        Mood and Affect: Mood and affect normal.        Speech: Speech normal.         Behavior: Behavior normal.     ED Results / Procedures / Treatments   Labs (all labs ordered are listed, but only abnormal results are displayed) Labs Reviewed - No data to display  EKG None  Radiology No results found.  Procedures  Procedures   Medications Ordered in ED Medications - No data to display  ED Course  I have reviewed the triage vital signs and the nursing notes.  Pertinent labs & imaging results that were available during my care of the patient were reviewed by me and considered in my medical decision making (see chart for details).    MDM Rules/Calculators/A&P                          70 yo F who presents for evaluation of head injury that occurred at 12 pm this afternoon. Patient reports she was in her garage and tripped and fell, causing her to hit her head against the wall. No LOC. No blood thinner use. She has not had any nausea/vomiting, numbness/weakness. Patient is afebrile, non-toxic appearing, sitting comfortably on examination table. Vital signs reviewed and stable.  On exam, she has a very small area of soft tissue swelling noted to the right frontal head.  No underlying hematoma.  No underlying skull deformity or crepitus noted.  She has no neuro deficits noted on exam.  I discussed with patient at length regarding her reassuring exam, mechanism of injury.  Patient with no LOC.  She is not on blood thinners.  Husband reports that she has been at her baseline.  No neuro deficits noted on exam.  At this time, do not feel that she needs CT head given lack of risk factors, mechanism of injury as well as appearing on exam.  I discussed this with both patient and husband and they are agreeable. At this time, patient exhibits no emergent life-threatening condition that require further evaluation in ED. Patient had ample opportunity for questions and discussion. All patient's questions were answered with full understanding. Strict return precautions discussed. Patient  expresses understanding and agreement to plan.   Portions of this note were generated with Scientist, clinical (histocompatibility and immunogenetics). Dictation errors may occur despite best attempts at proofreading.   Final Clinical Impression(s) / ED Diagnoses Final diagnoses:  Injury of head, initial encounter  Fall, initial encounter    Rx / DC Orders ED Discharge Orders    None       Rosana Hoes 02/19/20 2238    Little, Ambrose Finland, MD 02/22/20 [phone]

## 2020-03-02 ENCOUNTER — Other Ambulatory Visit: Payer: Self-pay

## 2020-03-02 ENCOUNTER — Ambulatory Visit (INDEPENDENT_AMBULATORY_CARE_PROVIDER_SITE_OTHER): Payer: Medicare HMO | Admitting: Family

## 2020-03-02 VITALS — BP 135/76 | HR 68 | Temp 98.4°F | Resp 16 | Wt 154.8 lb

## 2020-03-02 DIAGNOSIS — M545 Low back pain, unspecified: Secondary | ICD-10-CM

## 2020-03-02 DIAGNOSIS — S39012A Strain of muscle, fascia and tendon of lower back, initial encounter: Secondary | ICD-10-CM

## 2020-03-02 DIAGNOSIS — R350 Frequency of micturition: Secondary | ICD-10-CM | POA: Diagnosis not present

## 2020-03-02 LAB — POC URINALSYSI DIPSTICK (AUTOMATED)
Bilirubin, UA: NEGATIVE
Blood, UA: NEGATIVE
Glucose, UA: NEGATIVE
Ketones, UA: NEGATIVE
Leukocytes, UA: NEGATIVE
Nitrite, UA: NEGATIVE
Protein, UA: NEGATIVE
Spec Grav, UA: 1.015 (ref 1.010–1.025)
Urobilinogen, UA: NEGATIVE E.U./dL — AB
pH, UA: 6 (ref 5.0–8.0)

## 2020-03-02 MED ORDER — MELOXICAM 7.5 MG PO TABS: 7.5000 mg | ORAL_TABLET | Freq: Every day | ORAL | 0 refills | Status: DC | PRN

## 2020-03-02 NOTE — Progress Notes (Signed)
Subjective:    Patient ID: Heidi [name], female    DOB: Apr 26, 1950, 70 y.o.   MRN: [id number]  HPI  Patient is a 70 yr old female who presents today with c/o low back pain. Pain started on Friday 2/25 She reports that she has had similar symptoms in the past with UTI.  Denies fever dysuria. + frequency but she is also drinking more water. Denies known injury, but notes that pain is worse with certain movements.    Review of Systems    see HPI  Past Medical History:  Diagnosis Date  . Allergic rhinitis due to pollen   . Allergy   . Anemia, unspecified   . Blood transfusion without reported diagnosis    1979  . Disorder of bone and cartilage, unspecified   . Eosinophilia   . Headache 09/24/2016  . Lymphocytosis (symptomatic)   . Osteoporosis   . Other abnormal blood chemistry   . Other and unspecified hyperlipidemia   . Pain in joint, shoulder region   . Reflux esophagitis   . Type II or unspecified type diabetes mellitus without mention of complication, uncontrolled   . Umbilical hernia without mention of obstruction or gangrene   . Unspecified disorder of liver      Social History   Socioeconomic History  . Marital status: Married    Spouse name: Eleanora Neighbor  . Number of children: 4  . Years of education: BSN  . Highest education level: Not on file  Occupational History  . Occupation: Teacher, adult education: [employer]  Tobacco Use  . Smoking status: Never Smoker  . Smokeless tobacco: Never Used  Substance and Sexual Activity  . Alcohol use: No  . Drug use: No  . Sexual activity: Not on file  Other Topics Concern  . Not on file  Social History Narrative   Married   Never smoked   Alcohol none   Exercise walk 2-3 times week   Children 4   BS Nursing , retired         International aid/development worker of [employer] Strain: Not on file  Food Insecurity: Not on file  Transportation Needs: Not on file  Physical Activity: Not on file  Stress: Not  on file  Social Connections: Not on file  Intimate Partner Violence: Not on file    Past Surgical History:  Procedure Laterality Date  . CATARACT EXTRACTION, BILATERAL    . CESAREAN SECTION    . COLONOSCOPY  2015   Dr. Arlyce Dice, normal exam    Family History  Problem Relation Age of Onset  . Pancreatic cancer Neg Hx   . Colon cancer Neg Hx   . Esophageal cancer Neg Hx   . Stomach cancer Neg Hx     Allergies  Allergen Reactions  . Ciprofloxacin Anaphylaxis    Patient states she gets "CDIFF" Family states this is not correct and this is not an allergy    Current Outpatient Medications on File Prior to Visit  Medication Sig Dispense Refill  . calcium-vitamin D (OSCAL WITH D) 500-200 MG-UNIT per tablet Take 1 tablet by mouth daily.    . fexofenadine (ALLEGRA) 60 MG tablet Take 60 mg by mouth 2 (two) times daily.    . hydrocortisone (CORTEF) 10 MG tablet Take 1 tablet (10 mg total) by mouth 2 (two) times daily. 30 tablet 0  . Multiple Vitamin (MULTIVITAMIN) tablet Take 1 tablet by mouth daily.    Marland Kitchen  simvastatin (ZOCOR) 20 MG tablet Take 1 tablet (20 mg total) by mouth at bedtime. 90 tablet 3  . traZODone (DESYREL) 50 MG tablet TAKE 1/2 TO 1 (ONE-HALF TO ONE) TABLET BY MOUTH AT BEDTIME FOR SLEEP 90 tablet 0   No current facility-administered medications on file prior to visit.    BP 135/76 (BP Location: Right Arm, Patient Position: Sitting, Cuff Size: Small)   Pulse 68   Temp 98.4 F (36.9 C) (Oral)   Resp 16   Wt 154 lb 12.8 oz (70.2 kg)   SpO2 100%   BMI 28.85 kg/m    Objective:   Physical Exam Constitutional:      Appearance: She is well-developed and well-nourished.  Cardiovascular:     Rate and Rhythm: Normal rate and regular rhythm.     Heart sounds: Normal heart sounds. No murmur heard.   Pulmonary:     Effort: Pulmonary effort is normal. No respiratory distress.     Breath sounds: Normal breath sounds. No wheezing.  Abdominal:     General: Bowel sounds  are normal.     Palpations: Abdomen is soft.     Tenderness: There is no abdominal tenderness. There is no left CVA tenderness.  Musculoskeletal:     Thoracic back: Normal. No swelling or tenderness.     Lumbar back: Normal. No swelling or tenderness.  Psychiatric:        Mood and Affect: Mood and affect normal.        Behavior: Behavior normal.        Thought Content: Thought content normal.        Judgment: Judgment normal.           Assessment & Plan:  Lumbar strain- pain is worsen with movement, especially when she tried to sit upright from laying on exam table. Normal UA (will send for culture to confirm no UTI).  Recommended short course of prn meloxicam. She is advised to call if new/worsening symptoms or if symptoms are not improved in 1-2 weeks.  Pt verbalizes understanding.  This visit occurred during the SARS-CoV-2 public health emergency.  Safety protocols were in place, including screening questions prior to the visit, additional usage of staff PPE, and extensive cleaning of exam room while observing appropriate contact time as indicated for disinfecting solutions.

## 2020-03-03 LAB — URINE CULTURE
MICRO NUMBER:: 11586340
Result:: NO GROWTH
SPECIMEN QUALITY:: ADEQUATE

## 2020-03-13 DIAGNOSIS — E785 Hyperlipidemia, unspecified: Secondary | ICD-10-CM | POA: Diagnosis not present

## 2020-03-13 DIAGNOSIS — Z7952 Long term (current) use of systemic steroids: Secondary | ICD-10-CM | POA: Diagnosis not present

## 2020-03-13 DIAGNOSIS — Z008 Encounter for other general examination: Secondary | ICD-10-CM | POA: Diagnosis not present

## 2020-03-13 DIAGNOSIS — R030 Elevated blood-pressure reading, without diagnosis of hypertension: Secondary | ICD-10-CM | POA: Diagnosis not present

## 2020-03-13 DIAGNOSIS — E271 Primary adrenocortical insufficiency: Secondary | ICD-10-CM | POA: Diagnosis not present

## 2020-03-13 DIAGNOSIS — K5900 Constipation, unspecified: Secondary | ICD-10-CM | POA: Diagnosis not present

## 2020-03-13 DIAGNOSIS — G8929 Other chronic pain: Secondary | ICD-10-CM | POA: Diagnosis not present

## 2020-03-13 DIAGNOSIS — H04129 Dry eye syndrome of unspecified lacrimal gland: Secondary | ICD-10-CM | POA: Diagnosis not present

## 2020-03-13 DIAGNOSIS — J309 Allergic rhinitis, unspecified: Secondary | ICD-10-CM | POA: Diagnosis not present

## 2020-04-10 DIAGNOSIS — H40013 Open angle with borderline findings, low risk, bilateral: Secondary | ICD-10-CM | POA: Diagnosis not present

## 2020-04-10 LAB — HM DIABETES EYE EXAM

## 2020-04-14 ENCOUNTER — Other Ambulatory Visit: Payer: Self-pay | Admitting: Family Medicine

## 2020-04-14 DIAGNOSIS — F5101 Primary insomnia: Secondary | ICD-10-CM

## 2020-04-20 ENCOUNTER — Other Ambulatory Visit: Payer: Self-pay | Admitting: Family Medicine

## 2020-04-20 DIAGNOSIS — E782 Mixed hyperlipidemia: Secondary | ICD-10-CM

## 2020-06-16 ENCOUNTER — Encounter: Payer: Self-pay | Admitting: Family Medicine

## 2020-06-16 DIAGNOSIS — Z20822 Contact with and (suspected) exposure to covid-19: Secondary | ICD-10-CM | POA: Diagnosis not present

## 2020-07-17 DIAGNOSIS — M810 Age-related osteoporosis without current pathological fracture: Secondary | ICD-10-CM | POA: Diagnosis not present

## 2020-07-17 DIAGNOSIS — Z124 Encounter for screening for malignant neoplasm of cervix: Secondary | ICD-10-CM | POA: Diagnosis not present

## 2020-07-17 DIAGNOSIS — Z6828 Body mass index (BMI) 28.0-28.9, adult: Secondary | ICD-10-CM | POA: Diagnosis not present

## 2020-07-17 DIAGNOSIS — Z01419 Encounter for gynecological examination (general) (routine) without abnormal findings: Secondary | ICD-10-CM | POA: Diagnosis not present

## 2020-07-17 DIAGNOSIS — Z1231 Encounter for screening mammogram for malignant neoplasm of breast: Secondary | ICD-10-CM | POA: Diagnosis not present

## 2020-07-17 DIAGNOSIS — Z01411 Encounter for gynecological examination (general) (routine) with abnormal findings: Secondary | ICD-10-CM | POA: Diagnosis not present

## 2020-07-24 DIAGNOSIS — Z780 Asymptomatic menopausal state: Secondary | ICD-10-CM | POA: Diagnosis not present

## 2020-07-24 DIAGNOSIS — M8589 Other specified disorders of bone density and structure, multiple sites: Secondary | ICD-10-CM | POA: Diagnosis not present

## 2020-07-24 LAB — HM DEXA SCAN

## 2020-07-28 ENCOUNTER — Encounter: Payer: Self-pay | Admitting: *Deleted

## 2020-07-29 ENCOUNTER — Encounter: Payer: Self-pay | Admitting: Family Medicine

## 2020-09-01 NOTE — Progress Notes (Addendum)
[facility] [address] [place], [place] [address] [phone]  Date:  09/02/2020   Name:  Heidi [name]   DOB:  1950/07/27   MRN:  [id number]  PCP:  Pearline Cables, MD    Chief Complaint: Abdominal Pain (Lower right abdominal pain, groin area, aching) and tongue lesion (Dry mouth, possibly medication related?)   History of Present Illness:  Heidi [name] is a 70 y.o. very pleasant female patient who presents with the following:  Patient seen today with concern of groin pain and also a tongue lesion, she is concerned and may need a biopsy  Most recent visit with myself about 1 year ago History of well-controlled diabetes, hyponatremia, anemia, adrenal insufficiency, memory loss  Her endocrinologist is Dr. Talmage Nap who treats her adrenal insufficiency with Cortef 10 mg am and 5 pm  Most recent visit with endocrinology was in October  Due for tetanus vaccine COVID-19 booster A1c and urine microalbumin are due Most recent labs on MyChart about 1 year ago Colon in 2015 She gets her pap per [facility] and reports this was done last year   She has noted a dry tongue for 2-3 months Seems to be getting worse- she often feels like she needs to drink some water  She also feels like there is an area of skin irregularity or possible lesion at the tip of the tongue, left side  She has noted a pain in her right groin She had in in the past- went away- but came back about this past February It does not occur every day She notes it the most when she is seated Nothing on the left side  The pain is intermittent  Describes as an ache  She noted onset of some constipation about a year ago She is using colace BID No change in stool caliper  or blood in stool  Patient Active Problem List   Diagnosis Date Noted   Impingement syndrome of left shoulder 10/09/2019   Osteopenia 10/03/2019   Memory loss 06/23/2017   Gait  abnormality 06/23/2017   Uterine prolapse 09/24/2016   Headache 09/24/2016   Hyponatremia 03/03/2015   Special screening for malignant neoplasms, colon 12/10/2012   Lymphocytosis (symptomatic)    Disorder of bone and cartilage, unspecified    Type II or unspecified type diabetes mellitus without mention of complication, uncontrolled    Reflux esophagitis    Anemia, unspecified    Other and unspecified hyperlipidemia    Lymphocytosis 01/16/2012   Idiopathic eosinophilia 04/05/2011   NONSPECIFIC ABNORMAL RESULTS LIVR FUNCTION STUDY 08/15/2008   ANEMIA, HX OF 08/15/2008    Past Medical History:  Diagnosis Date   Allergic rhinitis due to pollen    Allergy    Anemia, unspecified    Blood transfusion without reported diagnosis    1979   Disorder of bone and cartilage, unspecified    Eosinophilia    Headache 09/24/2016   Lymphocytosis (symptomatic)    Osteoporosis    Other abnormal blood chemistry    Other and unspecified hyperlipidemia    Pain in joint, shoulder region    Reflux esophagitis    Type II or unspecified type diabetes mellitus without mention of complication, uncontrolled    Umbilical hernia without mention of obstruction or gangrene    Unspecified disorder of liver     Past Surgical History:  Procedure Laterality Date   CATARACT EXTRACTION, BILATERAL     CESAREAN  SECTION     COLONOSCOPY  2015   Dr. Arlyce Dice, normal exam    Social History   Tobacco Use   Smoking status: Never   Smokeless tobacco: Never  Substance Use Topics   Alcohol use: No   Drug use: No    Family History  Problem Relation Age of Onset   Pancreatic cancer Neg Hx    Colon cancer Neg Hx    Esophageal cancer Neg Hx    Stomach cancer Neg Hx     Allergies  Allergen Reactions   Ciprofloxacin Anaphylaxis    Patient states she gets "CDIFF" Family states this is not correct and this is not an allergy    Medication list has been reviewed and updated.  Current Outpatient Medications  on File Prior to Visit  Medication Sig Dispense Refill   calcium-vitamin D (OSCAL WITH D) 500-200 MG-UNIT per tablet Take 1 tablet by mouth daily.     fexofenadine (ALLEGRA) 60 MG tablet Take 60 mg by mouth daily.     hydrocortisone (CORTEF) 10 MG tablet Take 1 tablet (10 mg total) by mouth 2 (two) times daily. (Patient taking differently: Take 10 mg by mouth 2 (two) times daily. One tablet (10 mg) in morning and one half (5mg ) at night) 30 tablet 0   meloxicam (MOBIC) 7.5 MG tablet Take 1 tablet (7.5 mg total) by mouth daily as needed for pain. 14 tablet 0   Multiple Vitamin (MULTIVITAMIN) tablet Take 1 tablet by mouth daily.     simvastatin (ZOCOR) 20 MG tablet Take 1 tablet (20 mg total) by mouth at bedtime. 90 tablet 1   traZODone (DESYREL) 50 MG tablet TAKE 1/2 TO 1 (ONE-HALF TO ONE) TABLET BY MOUTH AT BEDTIME FOR SLEEP 90 tablet 1   No current facility-administered medications on file prior to visit.    Review of Systems:  As per HPI- otherwise negative.   Physical Examination: Vitals:   09/02/20 1418  BP: 124/62  Pulse: 74  Resp: 16  Temp: (!) 97.5 F (36.4 C)  SpO2: 99%   Vitals:   09/02/20 1418  Weight: 153 lb (69.4 kg)  Height: 5\' 1"  (1.549 m)   Body mass index is 28.91 kg/m. Ideal Body Weight: Weight in (lb) to have BMI = 25: 132  GEN: no acute distress.  Mild overweight, looks well HEENT: Atraumatic, Normocephalic. Bilateral TM wnl, oropharynx normal.  PEERL,EOMI.   I am not able to appreciate any definite abnormality of her tongue. Ears and Nose: No external deformity. CV: RRR, No M/G/R. No JVD. No thrill. No extra heart sounds. PULM: CTA B, no wheezes, crackles, rhonchi. No retractions. No resp. distress. No accessory muscle use. ABD: S, ND, +BS. No rebound. No HSM. Abdominal exam is benign, she notes that her tenderness seems to be in the right lower quadrant when it occurs-this area is minimally tender today EXTR: No c/c/e PSYCH: Normally interactive.  Conversant.    Assessment and Plan: Hyponatremia - Plan: Comprehensive metabolic panel  Mixed hyperlipidemia - Plan: Lipid panel  Controlled type 2 diabetes mellitus without complication, without long-term current use of insulin (HCC) - Plan: Comprehensive metabolic panel, Hemoglobin A1c, Microalbumin / creatinine urine ratio  Adrenal insufficiency (Addison's disease) (HCC) - Plan: CBC, Comprehensive metabolic panel  Screening for thyroid disorder - Plan: TSH  Depression, major, single episode, mild (HCC)  Fatigue, unspecified type - Plan: TSH, VITAMIN D 25 Hydroxy (Vit-D Deficiency, Fractures)  Chronic RLQ pain - Plan: CT Abdomen Pelvis W Contrast  Patient seen today for follow-up and a couple of concerns.  Labs are pending as above She is concerned about an issue with her tongue.  I advised her that I do not appreciate any definite abnormality.  Her dentist apparently said the same thing per her report.  I did offer to have her see oral surgery for an opinion.  She declines for the time being but will keep [place] posted Of note she is a non-smoker and does not drink alcohol  She has noted a intermittent right lower quadrant pain for several months.  Given persistence of this symptom I would like to get a CT scan to rule out any concerning abnormality.  The patient is in agreement and will let me know if she does not hear about her scan in the next week or so This visit occurred during the SARS-CoV-2 public health emergency.  Safety protocols were in place, including screening questions prior to the visit, additional usage of staff PPE, and extensive cleaning of exam room while observing appropriate contact time as indicated for disinfecting solutions.   Signed Abbe Amsterdam, MD  9/1, received her labs as below.  Message to patient  Results for orders placed or performed in visit on 09/02/20  CBC  Result Value Ref Range   WBC 7.7 4.0 - 10.5 K/uL   RBC 4.47 3.87 - 5.11 Mil/uL    Platelets 238.0 150.0 - 400.0 K/uL   Hemoglobin 12.6 12.0 - 15.0 g/dL   HCT 16.1 09.6 - 04.5 %   MCV 85.5 78.0 - 100.0 fl   MCHC 32.9 30.0 - 36.0 g/dL   RDW 40.9 81.1 - 91.4 %  Comprehensive metabolic panel  Result Value Ref Range   Sodium 138 135 - 145 mEq/L   Potassium 4.6 3.5 - 5.1 mEq/L   Chloride 103 96 - 112 mEq/L   CO2 29 19 - 32 mEq/L   Glucose, Bld 95 70 - 99 mg/dL   BUN 8 6 - 23 mg/dL   Creatinine, Ser 7.82 0.40 - 1.20 mg/dL   Total Bilirubin 0.3 0.2 - 1.2 mg/dL   Alkaline Phosphatase 75 39 - 117 U/L   AST 18 0 - 37 U/L   ALT 23 0 - 35 U/L   Total Protein 7.4 6.0 - 8.3 g/dL   Albumin 3.9 3.5 - 5.2 g/dL   GFR 95.62 (L) >13.08 mL/min   Calcium 9.4 8.4 - 10.5 mg/dL  Hemoglobin M5H  Result Value Ref Range   Hgb A1c MFr Bld 6.2 4.6 - 6.5 %  Lipid panel  Result Value Ref Range   Cholesterol 156 0 - 200 mg/dL   Triglycerides 84.6 0.0 - 149.0 mg/dL   HDL 96.29 >52.84 mg/dL   VLDL 13.2 0.0 - 44.0 mg/dL   LDL Cholesterol 88 0 - 99 mg/dL   Total CHOL/HDL Ratio 3    NonHDL 100.20   Microalbumin / creatinine urine ratio  Result Value Ref Range   Microalb, Ur <0.7 0.0 - 1.9 mg/dL   Creatinine,U 102.7 mg/dL   Microalb Creat Ratio 0.5 0.0 - 30.0 mg/g  TSH  Result Value Ref Range   TSH 0.89 0.35 - 5.50 uIU/mL  VITAMIN D 25 Hydroxy (Vit-D Deficiency, Fractures)  Result Value Ref Range   VITD 26.64 (L) 30.00 - 100.00 ng/mL

## 2020-09-02 ENCOUNTER — Other Ambulatory Visit: Payer: Self-pay

## 2020-09-02 ENCOUNTER — Ambulatory Visit (INDEPENDENT_AMBULATORY_CARE_PROVIDER_SITE_OTHER): Payer: Medicare HMO | Admitting: Family Medicine

## 2020-09-02 VITALS — BP 124/62 | HR 74 | Temp 97.5°F | Resp 16 | Ht 61.0 in | Wt 153.0 lb

## 2020-09-02 DIAGNOSIS — E119 Type 2 diabetes mellitus without complications: Secondary | ICD-10-CM

## 2020-09-02 DIAGNOSIS — E782 Mixed hyperlipidemia: Secondary | ICD-10-CM

## 2020-09-02 DIAGNOSIS — G8929 Other chronic pain: Secondary | ICD-10-CM | POA: Diagnosis not present

## 2020-09-02 DIAGNOSIS — E271 Primary adrenocortical insufficiency: Secondary | ICD-10-CM | POA: Diagnosis not present

## 2020-09-02 DIAGNOSIS — R5383 Other fatigue: Secondary | ICD-10-CM | POA: Diagnosis not present

## 2020-09-02 DIAGNOSIS — R1031 Right lower quadrant pain: Secondary | ICD-10-CM

## 2020-09-02 DIAGNOSIS — F320 Major depressive disorder, single episode, mild: Secondary | ICD-10-CM

## 2020-09-02 DIAGNOSIS — E871 Hypo-osmolality and hyponatremia: Secondary | ICD-10-CM

## 2020-09-02 DIAGNOSIS — Z1329 Encounter for screening for other suspected endocrine disorder: Secondary | ICD-10-CM

## 2020-09-02 DIAGNOSIS — R69 Illness, unspecified: Secondary | ICD-10-CM | POA: Diagnosis not present

## 2020-09-02 NOTE — Patient Instructions (Addendum)
Good to see you again today- take care and I will be in touch with your labs asap I don't see anything of alarm on your tongue.  However, if you would like to see oral surgery for a consult at any time let me know  We will arrange for a CT scan of your abdomen/ pelvis to try and determine the cause of your abdominal pain   I will set this up for you asap  Let me know if anything is changing or getting worse please let me know

## 2020-09-03 ENCOUNTER — Encounter: Payer: Self-pay | Admitting: Family Medicine

## 2020-09-03 LAB — LIPID PANEL
Cholesterol: 156 mg/dL (ref 0–200)
HDL: 55.8 mg/dL (ref >39.00)
LDL Cholesterol: 88 mg/dL (ref 0–99)
NonHDL: 100.2
Total CHOL/HDL Ratio: 3
Triglycerides: 60 mg/dL (ref 0.0–149.0)
VLDL: 12 mg/dL (ref 0.0–40.0)

## 2020-09-03 LAB — CBC
HCT: 38.2 % (ref 36.0–46.0)
Hemoglobin: 12.6 g/dL (ref 12.0–15.0)
MCHC: 32.9 g/dL (ref 30.0–36.0)
MCV: 85.5 fl (ref 78.0–100.0)
Platelets: 238 10*3/uL (ref 150.0–400.0)
RBC: 4.47 Mil/uL (ref 3.87–5.11)
RDW: 13.6 % (ref 11.5–15.5)
WBC: 7.7 10*3/uL (ref 4.0–10.5)

## 2020-09-03 LAB — COMPREHENSIVE METABOLIC PANEL
ALT: 23 U/L (ref 0–35)
AST: 18 U/L (ref 0–37)
Albumin: 3.9 g/dL (ref 3.5–5.2)
Alkaline Phosphatase: 75 U/L (ref 39–117)
BUN: 8 mg/dL (ref 6–23)
CO2: 29 mEq/L (ref 19–32)
Calcium: 9.4 mg/dL (ref 8.4–10.5)
Chloride: 103 mEq/L (ref 96–112)
Creatinine, Ser: 1.09 mg/dL (ref 0.40–1.20)
GFR: 51.87 mL/min — ABNORMAL LOW (ref >60.00)
Glucose, Bld: 95 mg/dL (ref 70–99)
Potassium: 4.6 mEq/L (ref 3.5–5.1)
Sodium: 138 mEq/L (ref 135–145)
Total Bilirubin: 0.3 mg/dL (ref 0.2–1.2)
Total Protein: 7.4 g/dL (ref 6.0–8.3)

## 2020-09-03 LAB — TSH: TSH: 0.89 u[IU]/mL (ref 0.35–5.50)

## 2020-09-03 LAB — VITAMIN D 25 HYDROXY (VIT D DEFICIENCY, FRACTURES): VITD: 26.64 ng/mL — ABNORMAL LOW (ref 30.00–100.00)

## 2020-09-03 LAB — HEMOGLOBIN A1C: Hgb A1c MFr Bld: 6.2 % (ref 4.6–6.5)

## 2020-09-03 LAB — MICROALBUMIN / CREATININE URINE RATIO
Creatinine,U: 147.2 mg/dL
Microalb Creat Ratio: 0.5 mg/g (ref 0.0–30.0)
Microalb, Ur: <0.7 mg/dL (ref 0.0–1.9)

## 2020-09-14 ENCOUNTER — Encounter: Payer: Self-pay | Admitting: Family Medicine

## 2020-09-14 ENCOUNTER — Ambulatory Visit (HOSPITAL_BASED_OUTPATIENT_CLINIC_OR_DEPARTMENT_OTHER)
Admission: RE | Admit: 2020-09-14 | Discharge: 2020-09-14 | Disposition: A | Discharge status: Home / Self Care | Payer: Medicare HMO | Source: Ambulatory Visit | Attending: Family Medicine | Admitting: Family Medicine

## 2020-09-14 ENCOUNTER — Encounter (HOSPITAL_BASED_OUTPATIENT_CLINIC_OR_DEPARTMENT_OTHER): Payer: Self-pay

## 2020-09-14 ENCOUNTER — Other Ambulatory Visit: Payer: Self-pay

## 2020-09-14 DIAGNOSIS — R1031 Right lower quadrant pain: Secondary | ICD-10-CM | POA: Insufficient documentation

## 2020-09-14 DIAGNOSIS — G8929 Other chronic pain: Secondary | ICD-10-CM | POA: Diagnosis not present

## 2020-09-14 DIAGNOSIS — R109 Unspecified abdominal pain: Secondary | ICD-10-CM | POA: Diagnosis not present

## 2020-09-14 MED ORDER — IOHEXOL 350 MG/ML SOLN
100.0000 mL | Freq: Once | INTRAVENOUS | Status: AC | PRN
  Administered 2020-09-14: 85 mL via INTRAVENOUS

## 2020-09-18 ENCOUNTER — Other Ambulatory Visit: Payer: Self-pay | Admitting: Family Medicine

## 2020-09-18 DIAGNOSIS — E782 Mixed hyperlipidemia: Secondary | ICD-10-CM

## 2020-10-07 DIAGNOSIS — E119 Type 2 diabetes mellitus without complications: Secondary | ICD-10-CM | POA: Diagnosis not present

## 2020-10-07 DIAGNOSIS — E236 Other disorders of pituitary gland: Secondary | ICD-10-CM | POA: Diagnosis not present

## 2020-10-07 DIAGNOSIS — E2740 Unspecified adrenocortical insufficiency: Secondary | ICD-10-CM | POA: Diagnosis not present

## 2020-10-07 DIAGNOSIS — E559 Vitamin D deficiency, unspecified: Secondary | ICD-10-CM | POA: Diagnosis not present

## 2020-10-14 DIAGNOSIS — E871 Hypo-osmolality and hyponatremia: Secondary | ICD-10-CM | POA: Diagnosis not present

## 2020-10-14 DIAGNOSIS — E236 Other disorders of pituitary gland: Secondary | ICD-10-CM | POA: Diagnosis not present

## 2020-10-14 DIAGNOSIS — G919 Hydrocephalus, unspecified: Secondary | ICD-10-CM | POA: Diagnosis not present

## 2020-10-14 DIAGNOSIS — Z7952 Long term (current) use of systemic steroids: Secondary | ICD-10-CM | POA: Diagnosis not present

## 2020-10-14 DIAGNOSIS — E559 Vitamin D deficiency, unspecified: Secondary | ICD-10-CM | POA: Diagnosis not present

## 2020-10-14 DIAGNOSIS — E119 Type 2 diabetes mellitus without complications: Secondary | ICD-10-CM | POA: Diagnosis not present

## 2020-10-14 DIAGNOSIS — E2740 Unspecified adrenocortical insufficiency: Secondary | ICD-10-CM | POA: Diagnosis not present

## 2020-10-14 DIAGNOSIS — M810 Age-related osteoporosis without current pathological fracture: Secondary | ICD-10-CM | POA: Diagnosis not present

## 2020-11-10 DIAGNOSIS — Z23 Encounter for immunization: Secondary | ICD-10-CM | POA: Diagnosis not present

## 2020-11-19 DIAGNOSIS — H35013 Changes in retinal vascular appearance, bilateral: Secondary | ICD-10-CM | POA: Diagnosis not present

## 2020-11-19 DIAGNOSIS — H35363 Drusen (degenerative) of macula, bilateral: Secondary | ICD-10-CM | POA: Diagnosis not present

## 2020-11-19 DIAGNOSIS — R7309 Other abnormal glucose: Secondary | ICD-10-CM | POA: Diagnosis not present

## 2020-11-19 DIAGNOSIS — H524 Presbyopia: Secondary | ICD-10-CM | POA: Diagnosis not present

## 2020-11-19 DIAGNOSIS — H40013 Open angle with borderline findings, low risk, bilateral: Secondary | ICD-10-CM | POA: Diagnosis not present

## 2020-11-19 LAB — HM DIABETES EYE EXAM

## 2020-11-23 DIAGNOSIS — M810 Age-related osteoporosis without current pathological fracture: Secondary | ICD-10-CM | POA: Diagnosis not present

## 2020-11-24 ENCOUNTER — Encounter: Payer: Self-pay | Admitting: *Deleted

## 2020-11-25 ENCOUNTER — Other Ambulatory Visit: Payer: Self-pay | Admitting: Family Medicine

## 2020-11-25 DIAGNOSIS — F5101 Primary insomnia: Secondary | ICD-10-CM

## 2021-01-11 ENCOUNTER — Telehealth: Payer: Self-pay | Admitting: Family Medicine

## 2021-01-11 NOTE — Telephone Encounter (Signed)
Left message for patient to call back and schedule Medicare Annual Wellness Visit (AWV) in office.  ° °If not able to come in office, please offer to do virtually or by telephone.  Left office number and my jabber #[phone]. ° °Due for AWVI ° °Please schedule at anytime with Nurse Health Advisor. °  °

## 2021-02-10 ENCOUNTER — Telehealth: Payer: Self-pay | Admitting: Family Medicine

## 2021-02-10 NOTE — Telephone Encounter (Signed)
Left message for patient to call back and schedule Medicare Annual Wellness Visit (AWV) in office.  ° °If not able to come in office, please offer to do virtually or by telephone.  Left office number and my jabber #[phone]. ° °Due for AWVI ° °Please schedule at anytime with Nurse Health Advisor. °  °

## 2021-02-12 ENCOUNTER — Ambulatory Visit (INDEPENDENT_AMBULATORY_CARE_PROVIDER_SITE_OTHER): Payer: Medicare HMO

## 2021-02-12 VITALS — BP 118/68 | HR 66 | Temp 98.3°F | Resp 16 | Ht 61.0 in | Wt 154.0 lb

## 2021-02-12 DIAGNOSIS — Z0000 Encounter for general adult medical examination without abnormal findings: Secondary | ICD-10-CM | POA: Diagnosis not present

## 2021-02-12 NOTE — Patient Instructions (Signed)
Ms. Heidi [name] , Thank you for taking time to come for your Medicare Wellness Visit. I appreciate your ongoing commitment to your health goals. Please review the following plan we discussed and let me know if I can assist you in the future.   Screening recommendations/referrals: Colonoscopy: Completed 02/06/2013-Due 02/07/2023 Mammogram: Completed 07/17/2020-Due 07/17/2021 Bone Density: Completed 07/24/2020-Due 07/25/2022 Recommended yearly ophthalmology/optometry visit for glaucoma screening and checkup Recommended yearly dental visit for hygiene and checkup  Vaccinations: Influenza vaccine: Up to date Pneumococcal vaccine: Up to date Tdap vaccine: Due-May obtain vaccine at your local pharmacy. Shingles vaccine: Completed vaccines   Covid-19:Up to date  Advanced directives: Please bring a copy of Living Will and/or Healthcare Power of Attorney for your chart.   Conditions/risks identified: See problem list  Next appointment: Follow up in one year for your annual wellness visit 02/14/2022 @ 11:00.   Preventive Care 70 Years and Older, Female Preventive care refers to lifestyle choices and visits with your health care provider that can promote health and wellness. What does preventive care include? A yearly physical exam. This is also called an annual well check. Dental exams once or twice a year. Routine eye exams. Ask your health care provider how often you should have your eyes checked. Personal lifestyle choices, including: Daily care of your teeth and gums. Regular physical activity. Eating a healthy diet. Avoiding tobacco and drug use. Limiting alcohol use. Practicing safe sex. Taking low-dose aspirin every day. Taking vitamin and mineral supplements as recommended by your health care provider. What happens during an annual well check? The services and screenings done by your health care provider during your annual well check will depend on your age, overall health, lifestyle risk  factors, and family history of disease. Counseling  Your health care provider may ask you questions about your: Alcohol use. Tobacco use. Drug use. Emotional well-being. Home and relationship well-being. Sexual activity. Eating habits. History of falls. Memory and ability to understand (cognition). Work and work Astronomer. Reproductive health. Screening  You may have the following tests or measurements: Height, weight, and BMI. Blood pressure. Lipid and cholesterol levels. These may be checked every 5 years, or more frequently if you are over 70 years old. Skin check. Lung cancer screening. You may have this screening every year starting at age 63 if you have a 30-pack-year history of smoking and currently smoke or have quit within the past 15 years. Fecal occult blood test (FOBT) of the stool. You may have this test every year starting at age 70. Flexible sigmoidoscopy or colonoscopy. You may have a sigmoidoscopy every 5 years or a colonoscopy every 10 years starting at age 70. Hepatitis C blood test. Hepatitis B blood test. Sexually transmitted disease (STD) testing. Diabetes screening. This is done by checking your blood sugar (glucose) after you have not eaten for a while (fasting). You may have this done every 1-3 years. Bone density scan. This is done to screen for osteoporosis. You may have this done starting at age 19. Mammogram. This may be done every 1-2 years. Talk to your health care provider about how often you should have regular mammograms. Talk with your health care provider about your test results, treatment options, and if necessary, the need for more tests. Vaccines  Your health care provider may recommend certain vaccines, such as: Influenza vaccine. This is recommended every year. Tetanus, diphtheria, and acellular pertussis (Tdap, Td) vaccine. You may need a Td booster every 10 years. Zoster vaccine. You may need this  after age 46. Pneumococcal 13-valent  conjugate (PCV13) vaccine. One dose is recommended after age 17. Pneumococcal polysaccharide (PPSV23) vaccine. One dose is recommended after age 48. Talk to your health care provider about which screenings and vaccines you need and how often you need them. This information is not intended to replace advice given to you by your health care provider. Make sure you discuss any questions you have with your health care provider. Document Released: 01/16/2015 Document Revised: 09/09/2015 Document Reviewed: 10/21/2014 Elsevier Interactive Patient Education  2017 [place] Prevention in the Home Falls can cause injuries. They can happen to people of all ages. There are many things you can do to make your home safe and to help prevent falls. What can I do on the outside of my home? Regularly fix the edges of walkways and driveways and fix any cracks. Remove anything that might make you trip as you walk through a door, such as a raised step or threshold. Trim any bushes or trees on the path to your home. Use bright outdoor lighting. Clear any walking paths of anything that might make someone trip, such as rocks or tools. Regularly check to see if handrails are loose or broken. Make sure that both sides of any steps have handrails. Any raised decks and porches should have guardrails on the edges. Have any leaves, snow, or ice cleared regularly. Use sand or salt on walking paths during winter. Clean up any spills in your garage right away. This includes oil or grease spills. What can I do in the bathroom? Use night lights. Install grab bars by the toilet and in the tub and shower. Do not use towel bars as grab bars. Use non-skid mats or decals in the tub or shower. If you need to sit down in the shower, use a plastic, non-slip stool. Keep the floor dry. Clean up any water that spills on the floor as soon as it happens. Remove soap buildup in the tub or shower regularly. Attach bath mats  securely with double-sided non-slip rug tape. Do not have throw rugs and other things on the floor that can make you trip. What can I do in the bedroom? Use night lights. Make sure that you have a light by your bed that is easy to reach. Do not use any sheets or blankets that are too big for your bed. They should not hang down onto the floor. Have a firm chair that has side arms. You can use this for support while you get dressed. Do not have throw rugs and other things on the floor that can make you trip. What can I do in the kitchen? Clean up any spills right away. Avoid walking on wet floors. Keep items that you use a lot in easy-to-reach places. If you need to reach something above you, use a strong step stool that has a grab bar. Keep electrical cords out of the way. Do not use floor polish or wax that makes floors slippery. If you must use wax, use non-skid floor wax. Do not have throw rugs and other things on the floor that can make you trip. What can I do with my stairs? Do not leave any items on the stairs. Make sure that there are handrails on both sides of the stairs and use them. Fix handrails that are broken or loose. Make sure that handrails are as long as the stairways. Check any carpeting to make sure that it is firmly attached to the  stairs. Fix any carpet that is loose or worn. Avoid having throw rugs at the top or bottom of the stairs. If you do have throw rugs, attach them to the floor with carpet tape. Make sure that you have a light switch at the top of the stairs and the bottom of the stairs. If you do not have them, ask someone to add them for you. What else can I do to help prevent falls? Wear shoes that: Do not have high heels. Have rubber bottoms. Are comfortable and fit you well. Are closed at the toe. Do not wear sandals. If you use a stepladder: Make sure that it is fully opened. Do not climb a closed stepladder. Make sure that both sides of the stepladder  are locked into place. Ask someone to hold it for you, if possible. Clearly mark and make sure that you can see: Any grab bars or handrails. First and last steps. Where the edge of each step is. Use tools that help you move around (mobility aids) if they are needed. These include: Canes. Walkers. Scooters. Crutches. Turn on the lights when you go into a dark area. Replace any light bulbs as soon as they burn out. Set up your furniture so you have a clear path. Avoid moving your furniture around. If any of your floors are uneven, fix them. If there are any pets around you, be aware of where they are. Review your medicines with your doctor. Some medicines can make you feel dizzy. This can increase your chance of falling. Ask your doctor what other things that you can do to help prevent falls. This information is not intended to replace advice given to you by your health care provider. Make sure you discuss any questions you have with your health care provider. Document Released: 10/16/2008 Document Revised: 05/28/2015 Document Reviewed: 01/24/2014 Elsevier Interactive Patient Education  2017 [employer].

## 2021-02-12 NOTE — Progress Notes (Signed)
Subjective:   Heidi [name] is a 71 y.o. female who presents for an Initial Medicare Annual Wellness Visit.  Review of Systems     Cardiac Risk Factors include: advanced age (>4055men, 49>65 women);diabetes mellitus     Objective:    Today's Vitals   02/12/21 1105  BP: 118/68  Pulse: 66  Resp: 16  Temp: 98.3 F (36.8 C)  TempSrc: Oral  SpO2: 98%  Weight: 154 lb (69.9 kg)  Height: 5\' 1"  (1.549 m)   Body mass index is 29.1 kg/m.  Advanced Directives 02/12/2021 02/19/2020 06/23/2017 06/23/2017 09/24/2016 06/27/2013  Does Patient Have a Medical Advance Directive? Yes No Yes Yes Yes Patient does not have advance directive;Patient would like information  Type of Advance Directive Healthcare Power of BriggsAttorney;Living will - Living will;Healthcare Power of State Street Corporationttorney Healthcare Power of VictorAttorney;Living will Healthcare Power of Attorney -  Does patient want to make changes to medical advance directive? - - No - Patient declined - - -  Copy of Healthcare Power of Attorney in Chart? No - copy requested - No - copy requested - - -  Would patient like information on creating a medical advance directive? - - - - - Advance directive brochure given (Outpatient ONLY)  Pre-existing out of facility DNR order (yellow form or pink MOST form) - - - - - No    Current Medications (verified) Outpatient Encounter Medications as of 02/12/2021  Medication Sig   calcium-vitamin D (OSCAL WITH D) 500-200 MG-UNIT per tablet Take 1 tablet by mouth daily.   fexofenadine (ALLEGRA) 60 MG tablet Take 60 mg by mouth daily.   hydrocortisone (CORTEF) 10 MG tablet Take 1 tablet (10 mg total) by mouth 2 (two) times daily. (Patient taking differently: Take 10 mg by mouth 2 (two) times daily. One tablet (10 mg) in morning and one half (5mg ) at night)   meloxicam (MOBIC) 7.5 MG tablet Take 1 tablet (7.5 mg total) by mouth daily as needed for pain.   Multiple Vitamin (MULTIVITAMIN) tablet Take 1 tablet by mouth daily.    simvastatin (ZOCOR) 20 MG tablet TAKE 1 TABLET BY MOUTH AT BEDTIME   traZODone (DESYREL) 50 MG tablet TAKE 1/2 TO 1 (ONE-HALF TO ONE) TABLET BY MOUTH AT BEDTIME FOR SLEEP   No facility-administered encounter medications on file as of 02/12/2021.    Allergies (verified) Ciprofloxacin   History: Past Medical History:  Diagnosis Date   Allergic rhinitis due to pollen    Allergy    Anemia, unspecified    Blood transfusion without reported diagnosis    1979   Disorder of bone and cartilage, unspecified    Eosinophilia    Headache 09/24/2016   Lymphocytosis (symptomatic)    Osteoporosis    Other abnormal blood chemistry    Other and unspecified hyperlipidemia    Pain in joint, shoulder region    Reflux esophagitis    Type II or unspecified type diabetes mellitus without mention of complication, uncontrolled    Umbilical hernia without mention of obstruction or gangrene    Unspecified disorder of liver    Past Surgical History:  Procedure Laterality Date   CATARACT EXTRACTION, BILATERAL     CESAREAN SECTION     COLONOSCOPY  2015   Dr. Arlyce DiceKaplan, normal exam   Family History  Problem Relation Age of Onset   Pancreatic cancer Neg Hx    Colon cancer Neg Hx    Esophageal cancer Neg Hx    Stomach cancer Neg Hx  Social History   Socioeconomic History   Marital status: Married    Spouse name: Bampia   Number of children: 4   Years of education: BSN   Highest education level: Not on file  Occupational History   Occupation: Teacher, adult educationN    Employer: [employer]  Tobacco Use   Smoking status: Never   Smokeless tobacco: Never  Substance and Sexual Activity   Alcohol use: No   Drug use: No   Sexual activity: Not on file  Other Topics Concern   Not on file  Social History Narrative   Married   Never smoked   Alcohol none   Exercise walk 2-3 times week   Children 4   BS Nursing , retired         International aid/development workerocial Determinants of Corporate investment bankerHealth   Financial Resource Strain: Low Risk     Difficulty of Paying Living Expenses: Not hard at all  Food Insecurity: No Food Insecurity   Worried About Programme researcher, broadcasting/film/videounning Out of Food in the Last Year: Never true   Baristaan Out of Food in the Last Year: Never true  Transportation Needs: No Transportation Needs   Lack of Transportation (Medical): No   Lack of Transportation (Non-Medical): No  Physical Activity: Unknown   Days of Exercise per Week: Not on file   Minutes of Exercise per Session: 30 min  Stress: No Stress Concern Present   Feeling of Stress : Not at all  Social Connections: Moderately Integrated   Frequency of Communication with Friends and Family: More than three times a week   Frequency of Social Gatherings with Friends and Family: Once a week   Attends Religious Services: More than 4 times per year   Active Member of Golden West FinancialClubs or Organizations: No   Attends Engineer, structuralClub or Organization Meetings: Never   Marital Status: Married    Tobacco Counseling Counseling given: Not Answered   Clinical Intake:  Pre-visit preparation completed: Yes  Pain : No/denies pain     BMI - recorded: 29.1 Nutritional Status: BMI 25 -29 Overweight Nutritional Risks: None Diabetes: Yes CBG done?: No Did pt. bring in CBG monitor from home?: No  How often do you need to have someone help you when you read instructions, pamphlets, or other written materials from your doctor or pharmacy?: 1 - Never  Diabetes:  Is the patient diabetic?  Yes  If diabetic, was a CBG obtained today?  No  Did the patient bring in their glucometer from home?  No  How often do you monitor your CBG's? never.   Financial Strains and Diabetes Management:  Are you having any financial strains with the device, your supplies or your medication? No .  Does the patient want to be seen by Chronic Care Management for management of their diabetes?  No  Would the patient like to be referred to a Nutritionist or for Diabetic Management?  No   Diabetic Exams:  Diabetic Eye Exam:  Completed 11/19/2020.   Diabetic Foot Exam: Pt has been advised about the importance in completing this exam. To be completed by PCP.   Interpreter Needed?: No  Information entered by :: Thomasenia SalesMartha Reida Hem LPN   Activities of Daily Living In your present state of health, do you have any difficulty performing the following activities: 02/12/2021 03/02/2020  Hearing? N N  Vision? N N  Difficulty concentrating or making decisions? N N  Walking or climbing stairs? N N  Dressing or bathing? N N  Doing errands, shopping? N  N  Preparing Food and eating ? N -  Using the Toilet? N -  In the past six months, have you accidently leaked urine? N -  Do you have problems with loss of bowel control? N -  Managing your Medications? N -  Managing your Finances? N -  Housekeeping or managing your Housekeeping? N -  Some recent data might be hidden    Patient Care Team: Copland, Gwenlyn Found, MD as PCP - General (Family Medicine) Mateo Flow, MD as Consulting Physician (Ophthalmology)  Indicate any recent Medical Services you may have received from other than [facility] providers in the past year (date may be approximate).     Assessment:   This is a routine wellness examination for Sheily.  Hearing/Vision screen Hearing Screening - Comments:: No issues Vision Screening - Comments:: Last eye exam-2022-Dr. Elmer Picker  Dietary issues and exercise activities discussed: Current Exercise Habits: Home exercise routine, Type of exercise: walking, Time (Minutes): 30, Frequency (Times/Week): 7, Weekly Exercise (Minutes/Week): 210, Intensity: Mild, Exercise limited by: None identified   Goals Addressed             This Visit's Progress    Patient Stated       Lose some weight, drink more water & increase activity       Depression Screen PHQ 2/9 Scores 02/12/2021 03/02/2020 03/21/2016 12/01/2015 06/08/2015 03/02/2015 02/15/2015  PHQ - 2 Score 0 0 0 0 0 0 0  PHQ- 9 Score - 0 - 0 - - -  Exception  Documentation - - Patient refusal - - - -    Fall Risk Fall Risk  02/12/2021 10/03/2019 12/13/2017 06/13/2017 03/21/2016  Falls in the past year? 0 0 0 No No  Number falls in past yr: 0 0 - - -  Injury with Fall? 0 0 - - -  Follow up Falls prevention discussed - - - -    FALL RISK PREVENTION PERTAINING TO THE HOME:  Any stairs in or around the home? Yes  If so, are there any without handrails? No  Home free of loose throw rugs in walkways, pet beds, electrical cords, etc? Yes  Adequate lighting in your home to reduce risk of falls? Yes   ASSISTIVE DEVICES UTILIZED TO PREVENT FALLS:  Life alert? No  Use of a cane, walker or w/c? No  Grab bars in the bathroom? No  Shower chair or bench in shower? No  Elevated toilet seat or a handicapped toilet? No   TIMED UP AND GO:  Was the test performed? Yes .  Length of time to ambulate 10 feet: 11 sec.   Gait steady and fast without use of assistive device  Cognitive Function:Patient has current diagnosis of mild cognitive impairment.   MMSE - Mini Mental State Exam 12/13/2017 06/13/2017  Orientation to time 4 4  Orientation to Place 4 4  Registration 3 3  Attention/ Calculation 3 2  Recall 2 1  Language- name 2 objects 2 2  Language- repeat 0 0  Language- follow 3 step command 3 3  Language- read & follow direction 1 1  Write a sentence 1 1  Copy design 0 0  Total score 23 21     6CIT Screen 02/12/2021  What Year? 0 points  What month? 3 points  What time? 0 points  Count back from 20 0 points  Months in reverse 0 points  Repeat phrase 10 points  Total Score 13    Immunizations Immunization History  Administered  Date(s) Administered   Fluad Quad(high Dose 65+) 10/03/2019   Influenza Whole 10/08/2012   Influenza,inj,Quad PF,6+ Mos 09/28/2015   Influenza-Unspecified 11/03/2020   PFIZER(Purple Top)SARS-COV-2 Vaccination 02/11/2019, 03/08/2019   Pfizer Covid-19 Vaccine Bivalent Booster 30yrs & up 11/03/2020    Pneumococcal Conjugate-13 12/31/2012   Pneumococcal Polysaccharide-23 09/07/2016   Td 01/04/2007   Zoster Recombinat (Shingrix) 09/18/2017, 11/27/2017   Zoster, Live 07/01/2015    TDAP status: Due, Education has been provided regarding the importance of this vaccine. Advised may receive this vaccine at local pharmacy or Health Dept. Aware to provide a copy of the vaccination record if obtained from local pharmacy or Health Dept. Verbalized acceptance and understanding.  Flu Vaccine status: Up to date  Pneumococcal vaccine status: Up to date  Covid-19 vaccine status: Completed vaccines  Qualifies for Shingles Vaccine? No   Zostavax completed No   Shingrix Completed?: Yes  Screening Tests Health Maintenance  Topic Date Due   TETANUS/TDAP  01/03/2017   FOOT EXAM  10/02/2020   COVID-19 Vaccine (4 - Booster for Pfizer series) 12/29/2020   HEMOGLOBIN A1C  03/02/2021   URINE MICROALBUMIN  09/02/2021   OPHTHALMOLOGY EXAM  11/19/2021   MAMMOGRAM  07/18/2022   COLONOSCOPY (Pts 45-19yrs Insurance coverage will need to be confirmed)  02/07/2023   Pneumonia Vaccine 76+ Years old  Completed   INFLUENZA VACCINE  Completed   DEXA SCAN  Completed   Hepatitis C Screening  Completed   Zoster Vaccines- Shingrix  Completed   HPV VACCINES  Aged Out    Health Maintenance  Health Maintenance Due  Topic Date Due   TETANUS/TDAP  01/03/2017   FOOT EXAM  10/02/2020   COVID-19 Vaccine (4 - Booster for Pfizer series) 12/29/2020    Colorectal cancer screening: Type of screening: Colonoscopy. Completed 02/06/2013. Repeat every 10 years  Mammogram status: Completed bilateral 07/17/2020. Repeat every year  Bone Density status: Completed 07/24/2020. Results reflect: Bone density results: OSTEOPENIA. Repeat every 2 years.  Lung Cancer Screening: (Low Dose CT Chest recommended if Age 13-80 years, 30 pack-year currently smoking OR have quit w/in 15years.) does not qualify.     Additional  Screening:  Hepatitis C Screening: Completed 09/10/2014  Vision Screening: Recommended annual ophthalmology exams for early detection of glaucoma and other disorders of the eye. Is the patient up to date with their annual eye exam?  Yes  Who is the provider or what is the name of the office in which the patient attends annual eye exams? Dr. Elmer Picker   Dental Screening: Recommended annual dental exams for proper oral hygiene  Community Resource Referral / Chronic Care Management: CRR required this visit?  No   CCM required this visit?  No      Plan:     I have personally reviewed and noted the following in the patients chart:   Medical and social history Use of alcohol, tobacco or illicit drugs  Current medications and supplements including opioid prescriptions. Patient is not currently taking opioid prescriptions. Functional ability and status Nutritional status Physical activity Advanced directives List of other physicians Hospitalizations, surgeries, and ER visits in previous 12 months Vitals Screenings to include cognitive, depression, and falls Referrals and appointments  In addition, I have reviewed and discussed with patient certain preventive protocols, quality metrics, and best practice recommendations. A written personalized care plan for preventive services as well as general preventive health recommendations were provided to patient.   Patient to access avs on mychart.  Roanna Raider, LPN  02/12/2021  Nurse Health Advisor  Nurse Notes: None

## 2021-03-18 ENCOUNTER — Telehealth: Payer: Self-pay | Admitting: Family Medicine

## 2021-03-18 NOTE — Telephone Encounter (Signed)
LVM for pt to schedule cpe appt, pt is due for an appt. ?

## 2021-03-23 ENCOUNTER — Other Ambulatory Visit: Payer: Self-pay | Admitting: Family Medicine

## 2021-03-23 DIAGNOSIS — E782 Mixed hyperlipidemia: Secondary | ICD-10-CM

## 2021-03-24 ENCOUNTER — Ambulatory Visit: Payer: Medicare HMO | Admitting: Family Medicine

## 2021-03-29 ENCOUNTER — Ambulatory Visit (INDEPENDENT_AMBULATORY_CARE_PROVIDER_SITE_OTHER): Payer: Medicare HMO | Admitting: Family Medicine

## 2021-03-29 VITALS — BP 120/70 | HR 76 | Temp 98.6°F | Resp 18 | Ht 61.0 in | Wt 157.0 lb

## 2021-03-29 DIAGNOSIS — E782 Mixed hyperlipidemia: Secondary | ICD-10-CM | POA: Diagnosis not present

## 2021-03-29 DIAGNOSIS — Z1329 Encounter for screening for other suspected endocrine disorder: Secondary | ICD-10-CM | POA: Diagnosis not present

## 2021-03-29 DIAGNOSIS — Z0000 Encounter for general adult medical examination without abnormal findings: Secondary | ICD-10-CM | POA: Diagnosis not present

## 2021-03-29 DIAGNOSIS — E119 Type 2 diabetes mellitus without complications: Secondary | ICD-10-CM

## 2021-03-29 DIAGNOSIS — E271 Primary adrenocortical insufficiency: Secondary | ICD-10-CM | POA: Diagnosis not present

## 2021-03-29 NOTE — Patient Instructions (Addendum)
Good to see you again today!  I will be in touch with your labs ?I would recommend getting a tetanus booster at your pharmacy  ?Otherwise assuming all is well please see me in 6 months  ?

## 2021-03-29 NOTE — Progress Notes (Addendum)
Therapist, music at [facility] ?[address], [address] ?[place], [place] [address] ?336 [phone] ?Fax [phone] ? ?Date:  03/29/2021  ? ?Name:  Heidi [name]   DOB:  1950/12/22   MRN:  [id number] ? ?PCP:  Darreld Mclean, MD  ? ? ?Chief Complaint: Annual Exam (Concerns/ questions: none/Foot exam due/Tdap: none in ncir) ? ? ?History of Present Illness: ? ?Heidi [name] is a 71 y.o. very pleasant female patient who presents with the following: ? ?Patient seen today for physical exam ?Most recent visit with myself was in August ? ?History of well-controlled diabetes, hyponatremia, anemia, adrenal insufficiency, memory loss ?  ?Her endocrinologist is Dr. Chalmers Cater who treats her adrenal insufficiency -most recent visit was in November ?She is taking cortef 10 and 5 pm  ? ?Pt notes she is feeling well  ?The trazodone helps her sleep - she uses prn, not daily  ? ?Tetanus booster due- recommend at pharmacy  ?Foot exam- update today ?Due for A1c and other routine labs today ? ?Mammogram last July ?DEXA scan also last July ?Colonoscopy due next year ? ?She is exercising by walking- 20- 30 minutes most days ?Non smoker, no alcohol ?Wt Readings from Last 3 Encounters:  ?03/29/21 157 lb (71.2 kg)  ?02/12/21 154 lb (69.9 kg)  ?09/02/20 153 lb (69.4 kg)  ? ? ?Patient Active Problem List  ? Diagnosis Date Noted  ? Impingement syndrome of left shoulder 10/09/2019  ? Osteopenia 10/03/2019  ? Memory loss 06/23/2017  ? Gait abnormality 06/23/2017  ? Uterine prolapse 09/24/2016  ? Headache 09/24/2016  ? Hyponatremia 03/03/2015  ? Special screening for malignant neoplasms, colon 12/10/2012  ? Lymphocytosis (symptomatic)   ? Disorder of bone and cartilage, unspecified   ? Type II or unspecified type diabetes mellitus without mention of complication, uncontrolled   ? Reflux esophagitis   ? Anemia, unspecified   ? Other and unspecified hyperlipidemia   ? Lymphocytosis 01/16/2012  ? Idiopathic eosinophilia 04/05/2011  ?  NONSPECIFIC ABNORMAL RESULTS LIVR FUNCTION STUDY 08/15/2008  ? ANEMIA, HX OF 08/15/2008  ? ? ?Past Medical History:  ?Diagnosis Date  ? Allergic rhinitis due to pollen   ? Allergy   ? Anemia, unspecified   ? Blood transfusion without reported diagnosis   ? 1979  ? Disorder of bone and cartilage, unspecified   ? Eosinophilia   ? Headache 09/24/2016  ? Lymphocytosis (symptomatic)   ? Osteoporosis   ? Other abnormal blood chemistry   ? Other and unspecified hyperlipidemia   ? Pain in joint, shoulder region   ? Reflux esophagitis   ? Type II or unspecified type diabetes mellitus without mention of complication, uncontrolled   ? Umbilical hernia without mention of obstruction or gangrene   ? Unspecified disorder of liver   ? ? ?Past Surgical History:  ?Procedure Laterality Date  ? CATARACT EXTRACTION, BILATERAL    ? CESAREAN SECTION    ? COLONOSCOPY  2015  ? Dr. Deatra Ina, normal exam  ? ? ?Social History  ? ?Tobacco Use  ? Smoking status: Never  ? Smokeless tobacco: Never  ?Substance Use Topics  ? Alcohol use: No  ? Drug use: No  ? ? ?Family History  ?Problem Relation Age of Onset  ? Pancreatic cancer Neg Hx   ? Colon cancer Neg Hx   ? Esophageal cancer Neg Hx   ? Stomach cancer Neg Hx   ? ? ?Allergies  ?Allergen Reactions  ? Ciprofloxacin Anaphylaxis  ?  Patient states she gets "CDIFF" Family states this is not correct and this is not an allergy  ? ? ?Medication list has been reviewed and updated. ? ?Current Outpatient Medications on File Prior to Visit  ?Medication Sig Dispense Refill  ? calcium-vitamin D (OSCAL WITH D) 500-200 MG-UNIT per tablet Take 1 tablet by mouth daily.    ? fexofenadine (ALLEGRA) 60 MG tablet Take 60 mg by mouth daily.    ? hydrocortisone (CORTEF) 10 MG tablet Take 1 tablet (10 mg total) by mouth 2 (two) times daily. (Patient taking differently: Take 10 mg by mouth 2 (two) times daily. One tablet (10 mg) in morning and one half (5mg ) at night) 30 tablet 0  ? meloxicam (MOBIC) 7.5 MG tablet Take 1  tablet (7.5 mg total) by mouth daily as needed for pain. 14 tablet 0  ? Multiple Vitamin (MULTIVITAMIN) tablet Take 1 tablet by mouth daily.    ? simvastatin (ZOCOR) 20 MG tablet TAKE 1 TABLET BY MOUTH AT BEDTIME 90 tablet 0  ? traZODone (DESYREL) 50 MG tablet TAKE 1/2 TO 1 (ONE-HALF TO ONE) TABLET BY MOUTH AT BEDTIME FOR SLEEP 90 tablet 0  ? ?No current facility-administered medications on file prior to visit.  ? ? ?Review of Systems: ? ?As per HPI- otherwise negative. ? ? ?Physical Examination: ?Vitals:  ? 03/29/21 1304  ?BP: 120/70  ?Pulse: 76  ?Resp: 18  ?Temp: 98.6 ?F (37 ?C)  ?SpO2: 99%  ? ?Vitals:  ? 03/29/21 1304  ?Weight: 157 lb (71.2 kg)  ?Height: 5\' 1"  (1.549 m)  ? ?Body mass index is 29.66 kg/m?. ?Ideal Body Weight: Weight in (lb) to have BMI = 25: 132 ? ?GEN: no acute distress. ?HEENT: Atraumatic, Normocephalic.  ?Ears and Nose: No external deformity. ?CV: RRR, No M/G/R. No JVD. No thrill. No extra heart sounds. ?PULM: CTA B, no wheezes, crackles, rhonchi. No retractions. No resp. distress. No accessory muscle use. ?ABD: S, NT, ND, +BS. No rebound. No HSM. ?EXTR: No c/c/e ?PSYCH: Normally interactive. Conversant.  ?Foot exam today normal  ? ?Assessment and Plan: ?Physical exam ? ?Controlled type 2 diabetes mellitus without complication, without long-term current use of insulin ([place]) - Plan: Comprehensive metabolic panel, Hemoglobin A1c ? ?Adrenal insufficiency (Addison's disease) ([place]) - Plan: CBC ? ?Mixed hyperlipidemia - Plan: Lipid panel ? ?Screening for thyroid disorder - Plan: TSH ?Pt seen today for a CPE  ?Continue healthy diet and exercise routine  ?Encouraged tetanus vaccine at pharmacy  ?Will plan further follow- up pending labs. ? ? ?Signed ?Lamar Blinks, MD ? ?Addnd 3/28- received labs as below, message to pt ? ?Results for orders placed or performed in visit on 03/29/21  ?CBC  ?Result Value Ref Range  ? WBC 7.4 4.0 - 10.5 K/uL  ? RBC 4.48 3.87 - 5.11 Mil/uL  ? Platelets 251.0 150.0 -  400.0 K/uL  ? Hemoglobin 12.6 12.0 - 15.0 g/dL  ? HCT 38.3 36.0 - 46.0 %  ? MCV 85.5 78.0 - 100.0 fl  ? MCHC 32.9 30.0 - 36.0 g/dL  ? RDW 13.7 11.5 - 15.5 %  ?Comprehensive metabolic panel  ?Result Value Ref Range  ? Sodium 138 135 - 145 mEq/L  ? Potassium 4.3 3.5 - 5.1 mEq/L  ? Chloride 104 96 - 112 mEq/L  ? CO2 27 19 - 32 mEq/L  ? Glucose, Bld 92 70 - 99 mg/dL  ? BUN 11 6 - 23 mg/dL  ? Creatinine, Ser 1.05 0.40 - 1.20 mg/dL  ?  Total Bilirubin 0.3 0.2 - 1.2 mg/dL  ? Alkaline Phosphatase 84 39 - 117 U/L  ? AST 21 0 - 37 U/L  ? ALT 25 0 - 35 U/L  ? Total Protein 7.6 6.0 - 8.3 g/dL  ? Albumin 4.2 3.5 - 5.2 g/dL  ? GFR 54.03 (L) >60.00 mL/min  ? Calcium 9.6 8.4 - 10.5 mg/dL  ?Hemoglobin A1c  ?Result Value Ref Range  ? Hgb A1c MFr Bld 6.4 4.6 - 6.5 %  ?Lipid panel  ?Result Value Ref Range  ? Cholesterol 180 0 - 200 mg/dL  ? Triglycerides 77.0 0.0 - 149.0 mg/dL  ? HDL 64.10 >39.00 mg/dL  ? VLDL 15.4 0.0 - 40.0 mg/dL  ? LDL Cholesterol 100 (H) 0 - 99 mg/dL  ? Total CHOL/HDL Ratio 3   ? NonHDL 115.53   ?TSH  ?Result Value Ref Range  ? TSH 1.21 0.35 - 5.50 uIU/mL  ? ? ? ?

## 2021-03-30 ENCOUNTER — Encounter: Payer: Self-pay | Admitting: Family Medicine

## 2021-03-30 LAB — COMPREHENSIVE METABOLIC PANEL
ALT: 25 U/L (ref 0–35)
AST: 21 U/L (ref 0–37)
Albumin: 4.2 g/dL (ref 3.5–5.2)
Alkaline Phosphatase: 84 U/L (ref 39–117)
BUN: 11 mg/dL (ref 6–23)
CO2: 27 mEq/L (ref 19–32)
Calcium: 9.6 mg/dL (ref 8.4–10.5)
Chloride: 104 mEq/L (ref 96–112)
Creatinine, Ser: 1.05 mg/dL (ref 0.40–1.20)
GFR: 54.03 mL/min — ABNORMAL LOW (ref >60.00)
Glucose, Bld: 92 mg/dL (ref 70–99)
Potassium: 4.3 mEq/L (ref 3.5–5.1)
Sodium: 138 mEq/L (ref 135–145)
Total Bilirubin: 0.3 mg/dL (ref 0.2–1.2)
Total Protein: 7.6 g/dL (ref 6.0–8.3)

## 2021-03-30 LAB — CBC
HCT: 38.3 % (ref 36.0–46.0)
Hemoglobin: 12.6 g/dL (ref 12.0–15.0)
MCHC: 32.9 g/dL (ref 30.0–36.0)
MCV: 85.5 fl (ref 78.0–100.0)
Platelets: 251 10*3/uL (ref 150.0–400.0)
RBC: 4.48 Mil/uL (ref 3.87–5.11)
RDW: 13.7 % (ref 11.5–15.5)
WBC: 7.4 10*3/uL (ref 4.0–10.5)

## 2021-03-30 LAB — LIPID PANEL
Cholesterol: 180 mg/dL (ref 0–200)
HDL: 64.1 mg/dL (ref >39.00)
LDL Cholesterol: 100 mg/dL — ABNORMAL HIGH (ref 0–99)
NonHDL: 115.53
Total CHOL/HDL Ratio: 3
Triglycerides: 77 mg/dL (ref 0.0–149.0)
VLDL: 15.4 mg/dL (ref 0.0–40.0)

## 2021-03-30 LAB — TSH: TSH: 1.21 u[IU]/mL (ref 0.35–5.50)

## 2021-03-30 LAB — HEMOGLOBIN A1C: Hgb A1c MFr Bld: 6.4 % (ref 4.6–6.5)

## 2021-04-14 ENCOUNTER — Ambulatory Visit (HOSPITAL_BASED_OUTPATIENT_CLINIC_OR_DEPARTMENT_OTHER)
Admission: RE | Admit: 2021-04-14 | Discharge: 2021-04-14 | Disposition: A | Discharge status: Home / Self Care | Payer: Medicare HMO | Source: Ambulatory Visit | Attending: Family Medicine | Admitting: Family Medicine

## 2021-04-14 ENCOUNTER — Encounter: Payer: Self-pay | Admitting: Family Medicine

## 2021-04-14 ENCOUNTER — Telehealth: Payer: Self-pay | Admitting: Family Medicine

## 2021-04-14 ENCOUNTER — Ambulatory Visit (INDEPENDENT_AMBULATORY_CARE_PROVIDER_SITE_OTHER): Payer: Medicare HMO | Admitting: Family Medicine

## 2021-04-14 VITALS — BP 114/67 | HR 64 | Ht 61.0 in | Wt 156.8 lb

## 2021-04-14 DIAGNOSIS — R0789 Other chest pain: Secondary | ICD-10-CM | POA: Diagnosis not present

## 2021-04-14 DIAGNOSIS — R1011 Right upper quadrant pain: Secondary | ICD-10-CM

## 2021-04-14 DIAGNOSIS — M25561 Pain in right knee: Secondary | ICD-10-CM

## 2021-04-14 NOTE — Telephone Encounter (Signed)
Seeing Beck at 2:40 pm today.  ?

## 2021-04-14 NOTE — Telephone Encounter (Signed)
Pt's daughter she has had chest pains for the last two weeks, no other sxs. Transferred to triage to speak with a nurse before scheduling. Please advise.  ?

## 2021-04-14 NOTE — Telephone Encounter (Signed)
Nurse Assessment ?Nurse: Carylon Perches, RN, Hilda Lias Date/Time (Eastern Time): 04/14/2021 9:05:35 AM ?Confirm and document reason for call. If ?symptomatic, describe symptoms. ?---Caller states her mother is having chest fullness. ?Complains of this every time she eats. Going on for a ?couple of weeks. ?Does the patient have any new or worsening ?symptoms? ---Yes ?Will a triage be completed? ---Yes ?Related visit to physician within the last 2 weeks? ---No ?Does the PT have any chronic conditions? (i.e. ?diabetes, asthma, this includes High risk factors for ?pregnancy, etc.) ?---Yes ?List chronic conditions. ---hyperlipidemia, addison's disease, pre-diabetes ?Is this a behavioral health or substance abuse call? ---No ?Guidelines ?Guideline Title Affirmed Question Affirmed Notes Nurse Date/Time (Eastern ?Time) ?Chest Pain [1] Chest pain lasts ?< 5 minutes AND ?[2] NO chest pain or ?cardiac symptoms ?(e.g., breathing ?difficulty, sweating) ?now (Exception: ?chest pains that last ?only a few seconds) ?Carylon Perches, RN, Hilda Lias 04/14/2021 9:07:25 ?AM ?PLEASE NOTE: All timestamps contained within this report are represented as [place] Standard Time. ?CONFIDENTIALTY NOTICE: This fax transmission is intended only for the addressee. It contains information that is legally privileged, confidential or ?otherwise protected from use or disclosure. If you are not the intended recipient, you are strictly prohibited from reviewing, disclosing, copying using ?or disseminating any of this information or taking any action in reliance on or regarding this information. If you have received this fax in error, please ?notify us immediately by telephone so that we can arrange for its return to [place]. Phone: [phone], Toll-Free: [phone], Fax: [phone] ?Page: 2 of 2 ?Call Id: [id number] ?Disp. Time (Eastern ?Time) Disposition Final User ?04/14/2021 9:03:27 AM Send to Urgent [place], [place] ?04/14/2021 9:10:56 AM See PCP within 24 Hours Yes Carylon Perches,  RN, Hilda Lias ?Caller Disagree/Comply Comply ?Caller Understands Yes ?PreDisposition Did not know what to do ?Care Advice Given Per Guideline ?SEE PCP WITHIN 24 HOURS: * IF OFFICE WILL BE OPEN: You need to be examined within the next 24 hours. Call your doctor ?(or NP/PA) when the office opens and make an appointment. CARE ADVICE given per Chest Pain (Adult) guideline. CALL BACK ?IF: ?Referrals ?REFERRED TO PCP OFFICE ?

## 2021-04-14 NOTE — Telephone Encounter (Signed)
Pt's daughter spoke with on call nurse and was advised to be seen within the practice within 24 hours.  ?

## 2021-04-14 NOTE — Progress Notes (Signed)
? ?Acute Office Visit ? ?Subjective:  ? ? Patient ID: Heidi [name], female    DOB: 12-08-1950, 71 y.o.   MRN: [id number] ? ?CC: chest/abdominal discomfort  ? ? ?HPI ?Patient is in today for chest/abdominal discomfort.  ? ?Patient reports that yesterday, while eating rice, she an episode of right sided chest/shoulder/abdominal pain that lasted for about 30 minutes. Reports the shoulder pain is more chronic so that may not have been related. She described the pain as burning 7/10 that eased up on it's own. She was having some right upper quadrant pain at the same time, but denies any mid/left chest pain, left arm/jaw pain, n/v/d, diaphoresis, headaches, vision changes, lightheadedness. ? ? ?Additionally reports a week or two of right posterior knee pain. States the pain is only noticeable when walking and relieves quickly with rest. She denies any injury, popping, clicking, locking, instability, swelling, radiating plan.  ? ? ? ? ?Past Medical History:  ?Diagnosis Date  ? Allergic rhinitis due to pollen   ? Allergy   ? Anemia, unspecified   ? Blood transfusion without reported diagnosis   ? 1979  ? Disorder of bone and cartilage, unspecified   ? Eosinophilia   ? Headache 09/24/2016  ? Lymphocytosis (symptomatic)   ? Osteoporosis   ? Other abnormal blood chemistry   ? Other and unspecified hyperlipidemia   ? Pain in joint, shoulder region   ? Reflux esophagitis   ? Type II or unspecified type diabetes mellitus without mention of complication, uncontrolled   ? Umbilical hernia without mention of obstruction or gangrene   ? Unspecified disorder of liver   ? ? ?Past Surgical History:  ?Procedure Laterality Date  ? CATARACT EXTRACTION, BILATERAL    ? CESAREAN SECTION    ? COLONOSCOPY  2015  ? Dr. Arlyce Dice, normal exam  ? ? ?Family History  ?Problem Relation Age of Onset  ? Pancreatic cancer Neg Hx   ? Colon cancer Neg Hx   ? Esophageal cancer Neg Hx   ? Stomach cancer Neg Hx   ? ? ?Social History  ? ?Socioeconomic History  ?  Marital status: Married  ?  Spouse name: Eleanora Neighbor  ? Number of children: 4  ? Years of education: BSN  ? Highest education level: Not on file  ?Occupational History  ? Occupation: Charity fundraiser  ?  Employer: [employer]  ?Tobacco Use  ? Smoking status: Never  ? Smokeless tobacco: Never  ?Substance and Sexual Activity  ? Alcohol use: No  ? Drug use: No  ? Sexual activity: Not on file  ?Other Topics Concern  ? Not on file  ?Social History Narrative  ? Married  ? Never smoked  ? Alcohol none  ? Exercise walk 2-3 times week  ? Children 4  ? BS Nursing , retired  ?   ?   ? ?Social Determinants of Health  ? ?Financial Resource Strain: Low Risk   ? Difficulty of Paying Living Expenses: Not hard at all  ?Food Insecurity: No Food Insecurity  ? Worried About Programme researcher, broadcasting/film/video in the Last Year: Never true  ? Ran Out of Food in the Last Year: Never true  ?Transportation Needs: No Transportation Needs  ? Lack of Transportation (Medical): No  ? Lack of Transportation (Non-Medical): No  ?Physical Activity: Unknown  ? Days of Exercise per Week: Not on file  ? Minutes of Exercise per Session: 30 min  ?Stress: No Stress Concern Present  ? Feeling of  Stress : Not at all  ?Social Connections: Moderately Integrated  ? Frequency of Communication with Friends and Family: More than three times a week  ? Frequency of Social Gatherings with Friends and Family: Once a week  ? Attends Religious Services: More than 4 times per year  ? Active Member of Clubs or Organizations: No  ? Attends Banker Meetings: Never  ? Marital Status: Married  ?Intimate Partner Violence: Not At Risk  ? Fear of Current or Ex-Partner: No  ? Emotionally Abused: No  ? Physically Abused: No  ? Sexually Abused: No  ? ? ?Outpatient Medications Prior to Visit  ?Medication Sig Dispense Refill  ? cetirizine (ZYRTEC) 10 MG chewable tablet Chew 10 mg by mouth daily.    ? calcium-vitamin D (OSCAL WITH D) 500-200 MG-UNIT per tablet Take 1 tablet by mouth daily.     ? CEQUA 0.09 % SOLN Apply 1 drop to eye 2 (two) times daily.    ? hydrocortisone (CORTEF) 10 MG tablet Take 1 tablet (10 mg total) by mouth 2 (two) times daily. (Patient taking differently: Take 10 mg by mouth 2 (two) times daily. One tablet (10 mg) in morning and one half (5mg ) at night) 30 tablet 0  ? meloxicam (MOBIC) 7.5 MG tablet Take 1 tablet (7.5 mg total) by mouth daily as needed for pain. 14 tablet 0  ? Multiple Vitamin (MULTIVITAMIN) tablet Take 1 tablet by mouth daily.    ? simvastatin (ZOCOR) 20 MG tablet TAKE 1 TABLET BY MOUTH AT BEDTIME 90 tablet 0  ? traZODone (DESYREL) 50 MG tablet TAKE 1/2 TO 1 (ONE-HALF TO ONE) TABLET BY MOUTH AT BEDTIME FOR SLEEP 90 tablet 0  ? fexofenadine (ALLEGRA) 60 MG tablet Take 60 mg by mouth daily.    ? ?No facility-administered medications prior to visit.  ? ? ?Allergies  ?Allergen Reactions  ? Ciprofloxacin Anaphylaxis  ?  Patient states she gets "CDIFF" Family states this is not correct and this is not an allergy  ? ? ?Review of Systems ?All review of systems negative except what is listed in the HPI ? ?   ?Objective:  ?  ?Physical Exam ?Vitals reviewed.  ?Constitutional:   ?   Appearance: Normal appearance.  ?Cardiovascular:  ?   Rate and Rhythm: Normal rate and regular rhythm.  ?Pulmonary:  ?   Effort: Pulmonary effort is normal.  ?   Breath sounds: Normal breath sounds.  ?Abdominal:  ?   General: Abdomen is flat. Bowel sounds are normal. There is no distension.  ?   Palpations: Abdomen is soft. There is no mass.  ?   Tenderness: There is no abdominal tenderness. There is no guarding or rebound.  ?Musculoskeletal:     ?   General: No swelling. Normal range of motion.  ?   Comments: Tenderness to palpation of posterior right knee and pain with PCL testing, but no instability, no pain with McMurrays or valgus/varus testing  ?Neurological:  ?   General: No focal deficit present.  ?   Mental Status: She is alert and oriented to person, place, and time. Mental status is  at baseline.  ?Psychiatric:     ?   Mood and Affect: Mood normal.     ?   Behavior: Behavior normal.     ?   Thought Content: Thought content normal.     ?   Judgment: Judgment normal.  ? ? ?BP 114/67   Pulse 64   Ht 5'  1" (1.549 m)   Wt 156 lb 12.8 oz (71.1 kg)   SpO2 98%   BMI 29.63 kg/m?  ?Wt Readings from Last 3 Encounters:  ?04/14/21 156 lb 12.8 oz (71.1 kg)  ?03/29/21 157 lb (71.2 kg)  ?02/12/21 154 lb (69.9 kg)  ? ? ?Health Maintenance Due  ?Topic Date Due  ? TETANUS/TDAP  01/03/2017  ? COVID-19 Vaccine (4 - Booster for Pfizer series) 12/29/2020  ? ? ?There are no preventive care reminders to display for this patient. ? ? ?Lab Results  ?Component Value Date  ? TSH 1.21 03/29/2021  ? ?Lab Results  ?Component Value Date  ? WBC 7.4 03/29/2021  ? HGB 12.6 03/29/2021  ? HCT 38.3 03/29/2021  ? MCV 85.5 03/29/2021  ? PLT 251.0 03/29/2021  ? ?Lab Results  ?Component Value Date  ? NA 138 03/29/2021  ? K 4.3 03/29/2021  ? CHLORIDE 105 11/19/2012  ? CO2 27 03/29/2021  ? GLUCOSE 92 03/29/2021  ? BUN 11 03/29/2021  ? CREATININE 1.05 03/29/2021  ? BILITOT 0.3 03/29/2021  ? ALKPHOS 84 03/29/2021  ? AST 21 03/29/2021  ? ALT 25 03/29/2021  ? PROT 7.6 03/29/2021  ? ALBUMIN 4.2 03/29/2021  ? CALCIUM 9.6 03/29/2021  ? ANIONGAP 7 06/25/2017  ? GFR 54.03 (L) 03/29/2021  ? ?Lab Results  ?Component Value Date  ? CHOL 180 03/29/2021  ? ?Lab Results  ?Component Value Date  ? HDL 64.10 03/29/2021  ? ?Lab Results  ?Component Value Date  ? LDLCALC 100 (H) 03/29/2021  ? ?Lab Results  ?Component Value Date  ? TRIG 77.0 03/29/2021  ? ?Lab Results  ?Component Value Date  ? CHOLHDL 3 03/29/2021  ? ?Lab Results  ?Component Value Date  ? HGBA1C 6.4 03/29/2021  ? ? ?   ?Assessment & Plan:  ? ?Chest discomfort ?RUQ pain ?EKG NSR, no ischemia. No other cardiac symptoms.  ?Blood work today - we will let you know results. Consider abdominal ultrasound if persistent discomfort. For now try to eat slower, avoid heavy meals.  ?- EKG 12-Lead ?-  CBC ?- Comprehensive metabolic panel ?- Lipase ? ? ?2. Posterior right knee pain ?Knee xray today. Start home exercises, ice, heat, rest, elevation, as needed tylenol/ibuprofen. If not improving we can refer

## 2021-04-14 NOTE — Patient Instructions (Addendum)
EKG today - normal  ?Blood work today - we will let you know results. Consider abdominal ultrasound if persistent discomfort. For now try to eat slower. ?Knee xray today. Start home exercises, ice, heat, rest, elevation, as needed tylenol/ibuprofen. If not improving we can refer to sports medicine.  ?

## 2021-04-15 LAB — LIPASE: Lipase: 40 U/L (ref 11.0–59.0)

## 2021-04-15 LAB — COMPREHENSIVE METABOLIC PANEL
ALT: 25 U/L (ref 0–35)
AST: 20 U/L (ref 0–37)
Albumin: 4.2 g/dL (ref 3.5–5.2)
Alkaline Phosphatase: 80 U/L (ref 39–117)
BUN: 15 mg/dL (ref 6–23)
CO2: 28 mEq/L (ref 19–32)
Calcium: 9.2 mg/dL (ref 8.4–10.5)
Chloride: 103 mEq/L (ref 96–112)
Creatinine, Ser: 0.95 mg/dL (ref 0.40–1.20)
GFR: 60.91 mL/min (ref >60.00)
Glucose, Bld: 84 mg/dL (ref 70–99)
Potassium: 4.2 mEq/L (ref 3.5–5.1)
Sodium: 138 mEq/L (ref 135–145)
Total Bilirubin: 0.3 mg/dL (ref 0.2–1.2)
Total Protein: 7.7 g/dL (ref 6.0–8.3)

## 2021-04-15 LAB — CBC
HCT: 39.8 % (ref 36.0–46.0)
Hemoglobin: 12.9 g/dL (ref 12.0–15.0)
MCHC: 32.3 g/dL (ref 30.0–36.0)
MCV: 85.3 fl (ref 78.0–100.0)
Platelets: 247 10*3/uL (ref 150.0–400.0)
RBC: 4.66 Mil/uL (ref 3.87–5.11)
RDW: 13.9 % (ref 11.5–15.5)
WBC: 7.4 10*3/uL (ref 4.0–10.5)

## 2021-04-16 ENCOUNTER — Encounter: Payer: Self-pay | Admitting: *Deleted

## 2021-05-21 DIAGNOSIS — M1990 Unspecified osteoarthritis, unspecified site: Secondary | ICD-10-CM | POA: Diagnosis not present

## 2021-05-21 DIAGNOSIS — H04129 Dry eye syndrome of unspecified lacrimal gland: Secondary | ICD-10-CM | POA: Diagnosis not present

## 2021-05-21 DIAGNOSIS — Z008 Encounter for other general examination: Secondary | ICD-10-CM | POA: Diagnosis not present

## 2021-05-21 DIAGNOSIS — E785 Hyperlipidemia, unspecified: Secondary | ICD-10-CM | POA: Diagnosis not present

## 2021-05-21 DIAGNOSIS — R030 Elevated blood-pressure reading, without diagnosis of hypertension: Secondary | ICD-10-CM | POA: Diagnosis not present

## 2021-05-21 DIAGNOSIS — E119 Type 2 diabetes mellitus without complications: Secondary | ICD-10-CM | POA: Diagnosis not present

## 2021-05-21 DIAGNOSIS — D84821 Immunodeficiency due to drugs: Secondary | ICD-10-CM | POA: Diagnosis not present

## 2021-05-21 DIAGNOSIS — Z7952 Long term (current) use of systemic steroids: Secondary | ICD-10-CM | POA: Diagnosis not present

## 2021-05-21 DIAGNOSIS — E2740 Unspecified adrenocortical insufficiency: Secondary | ICD-10-CM | POA: Diagnosis not present

## 2021-05-21 DIAGNOSIS — G4700 Insomnia, unspecified: Secondary | ICD-10-CM | POA: Diagnosis not present

## 2021-06-15 ENCOUNTER — Other Ambulatory Visit: Payer: Self-pay | Admitting: Family Medicine

## 2021-06-15 DIAGNOSIS — E782 Mixed hyperlipidemia: Secondary | ICD-10-CM

## 2021-07-19 DIAGNOSIS — Z1231 Encounter for screening mammogram for malignant neoplasm of breast: Secondary | ICD-10-CM | POA: Diagnosis not present

## 2021-07-19 LAB — HM MAMMOGRAPHY

## 2021-09-06 NOTE — Progress Notes (Addendum)
[facility] [address], [address] [place], [place] [address] [phone]  Date:  09/09/2021   Name:  Heidi [name]   DOB:  04/20/1950   MRN:  [id number]  PCP:  Pearline Cables, MD    Chief Complaint: Mass (Under the L breast, scratch on the right side. Unsteady gait, pain in the Right lower leg. )   History of Present Illness:  Heidi [name] is a 71 y.o. very pleasant female patient who presents with the following:  Patient seen today with concern of a mass on her chest/breast area Most recent visit with myself was in March for her physical History of well-controlled diabetes, hyponatremia, anemia, adrenal insufficiency, memory loss Her endocrinologist is Dr. Talmage Nap who treats her adrenal insufficiency   Lab Results  Component Value Date   HGBA1C 6.4 03/29/2021   Can update her A1c today Most recent mammogram in July 2022-negative ?  Patient may have had a more recent screening that I cannot view Pt states she had a mammo done this past July at [facility]- normal per her report  She has noted right calf pain that stops her from doing her normal exercise for 3-4 months It hurts with walking- at rest it does not hurt She notes consistently reproducible pain with exertion, consistent with claudication She had knee films done in April for right knee pain  04/14/21 RIGHT KNEE - COMPLETE 4+ VIEW   COMPARISON:  None. FINDINGS: No evidence of fracture, dislocation, or joint effusion. Minimal osteophyte formation is seen in the medial compartment and patellofemoral compartment. There is chondrocalcinosis in the lateral compartment, likely degenerative. Small superior and inferior patellar spurs are present. IMPRESSION: 1. No evidence for fracture or dislocation. 2. Mild degenerative changes.  2-3 months ago she developed an itchy spot on her left breast  It seems to have healed but there is still "a scab" and some  changes of the skin at the site of the spot It is no longer itchy, not painful  Will get labs and do her flu shot today  Patient Active Problem List   Diagnosis Date Noted   Impingement syndrome of left shoulder 10/09/2019   Osteopenia 10/03/2019   Memory loss 06/23/2017   Gait abnormality 06/23/2017   Uterine prolapse 09/24/2016   Headache 09/24/2016   Hyponatremia 03/03/2015   Special screening for malignant neoplasms, colon 12/10/2012   Lymphocytosis (symptomatic)    Disorder of bone and cartilage, unspecified    Type II or unspecified type diabetes mellitus without mention of complication, uncontrolled    Reflux esophagitis    Anemia, unspecified    Other and unspecified hyperlipidemia    Lymphocytosis 01/16/2012   Idiopathic eosinophilia 04/05/2011   NONSPECIFIC ABNORMAL RESULTS LIVR FUNCTION STUDY 08/15/2008   ANEMIA, HX OF 08/15/2008    Past Medical History:  Diagnosis Date   Allergic rhinitis due to pollen    Allergy    Anemia, unspecified    Blood transfusion without reported diagnosis    1979   Disorder of bone and cartilage, unspecified    Eosinophilia    Headache 09/24/2016   Lymphocytosis (symptomatic)    Osteoporosis    Other abnormal blood chemistry    Other and unspecified hyperlipidemia    Pain in joint, shoulder region    Reflux esophagitis    Type II or unspecified type diabetes mellitus without mention of complication, uncontrolled    Umbilical hernia without mention of obstruction  or gangrene    Unspecified disorder of liver     Past Surgical History:  Procedure Laterality Date   CATARACT EXTRACTION, BILATERAL     CESAREAN SECTION     COLONOSCOPY  2015   Dr. Arlyce Dice, normal exam    Social History   Tobacco Use   Smoking status: Never   Smokeless tobacco: Never  Substance Use Topics   Alcohol use: No   Drug use: No    Family History  Problem Relation Age of Onset   Pancreatic cancer Neg Hx    Colon cancer Neg Hx    Esophageal  cancer Neg Hx    Stomach cancer Neg Hx     Allergies  Allergen Reactions   Ciprofloxacin Anaphylaxis    Patient states she gets "CDIFF" Family states this is not correct and this is not an allergy    Medication list has been reviewed and updated.  Current Outpatient Medications on File Prior to Visit  Medication Sig Dispense Refill   calcium-vitamin D (OSCAL WITH D) 500-200 MG-UNIT per tablet Take 1 tablet by mouth daily.     CEQUA 0.09 % SOLN Apply 1 drop to eye 2 (two) times daily.     cetirizine (ZYRTEC) 10 MG chewable tablet Chew 10 mg by mouth daily.     hydrocortisone (CORTEF) 10 MG tablet Take 1 tablet (10 mg total) by mouth 2 (two) times daily. (Patient taking differently: Take 10 mg by mouth 2 (two) times daily. One tablet (10 mg) in morning and one half (5mg ) at night) 30 tablet 0   meloxicam (MOBIC) 7.5 MG tablet Take 1 tablet (7.5 mg total) by mouth daily as needed for pain. 14 tablet 0   Multiple Vitamin (MULTIVITAMIN) tablet Take 1 tablet by mouth daily.     simvastatin (ZOCOR) 20 MG tablet TAKE 1 TABLET BY MOUTH AT BEDTIME 90 tablet 1   traZODone (DESYREL) 50 MG tablet TAKE 1/2 TO 1 (ONE-HALF TO ONE) TABLET BY MOUTH AT BEDTIME FOR SLEEP 90 tablet 0   No current facility-administered medications on file prior to visit.    Review of Systems:  As per HPI- otherwise negative.   Physical Examination: Vitals:   09/09/21 1317  BP: 118/70  Pulse: 76  Resp: 18  Temp: 97.8 F (36.6 C)  SpO2: 96%   Vitals:   09/09/21 1317  Weight: 159 lb 4.8 oz (72.3 kg)  Height: 5\' 1"  (1.549 m)   Body mass index is 30.1 kg/m. Ideal Body Weight: Weight in (lb) to have BMI = 25: 132  GEN: no acute distress.  Looks well, mild obesity HEENT: Atraumatic, Normocephalic.  Ears and Nose: No external deformity. CV: RRR, No M/G/R. No JVD. No thrill. No extra heart sounds. PULM: CTA B, no wheezes, crackles, rhonchi. No retractions. No resp. distress. No accessory muscle use. ABD: S,  NT, ND EXTR: No c/c/e PSYCH: Normally interactive. Conversant.  Left breast: There is an approximately dime sized area of smooth and somewhat concave skin at approximately 3:00.  This seems most consistent with a scar Cannot palpate pulse in either foot  Calves are not swollen or tender, no cords or masses  Assessment and Plan: Controlled type 2 diabetes mellitus without complication, without long-term current use of insulin (HCC) - Plan: Hemoglobin A1c, Microalbumin / creatinine urine ratio, CBC, Comprehensive metabolic panel  Concern about appearance of breast - Plan: 11/09/21 BREAST COMPLETE UNI LEFT INC AXILLA  Intermittent claudication (HCC) - Plan: VAS ABI WITH/WO TBI  Need for immunization against influenza - Plan: Flu Vaccine QUAD High Dose(Fluad)  Patient seen today with a couple of concerns.  We will follow-up on her diabetes, labs pending as above Flu vaccine given I suspect the area of concern in her breast is actually a scar from recent skin lesion of some sort.  However, given location we will obtain an ultrasound to make sure all is well.  I will also request a copy of her recent mammogram Suspect she has claudication in her legs.  Order ABI  Signed Abbe Amsterdam, MD  Addendum 9/8, received labs as below.  Message to patient  Results for orders placed or performed in visit on 09/09/21  Hemoglobin A1c  Result Value Ref Range   Hgb A1c MFr Bld 6.4 4.6 - 6.5 %  Microalbumin / creatinine urine ratio  Result Value Ref Range   Microalb, Ur 1.1 0.0 - 1.9 mg/dL   Creatinine,U 031.5 mg/dL   Microalb Creat Ratio 0.5 0.0 - 30.0 mg/g  CBC  Result Value Ref Range   WBC 8.4 4.0 - 10.5 K/uL   RBC 4.51 3.87 - 5.11 Mil/uL   Platelets 248.0 150.0 - 400.0 K/uL   Hemoglobin 12.6 12.0 - 15.0 g/dL   HCT 94.5 85.9 - 29.2 %   MCV 86.5 78.0 - 100.0 fl   MCHC 32.3 30.0 - 36.0 g/dL   RDW 44.6 28.6 - 38.1 %  Comprehensive metabolic panel  Result Value Ref Range   Sodium 140 135 - 145  mEq/L   Potassium 4.8 3.5 - 5.1 mEq/L   Chloride 104 96 - 112 mEq/L   CO2 29 19 - 32 mEq/L   Glucose, Bld 85 70 - 99 mg/dL   BUN 14 6 - 23 mg/dL   Creatinine, Ser 7.71 0.40 - 1.20 mg/dL   Total Bilirubin 0.3 0.2 - 1.2 mg/dL   Alkaline Phosphatase 79 39 - 117 U/L   AST 20 0 - 37 U/L   ALT 25 0 - 35 U/L   Total Protein 7.8 6.0 - 8.3 g/dL   Albumin 4.0 3.5 - 5.2 g/dL   GFR 16.57 (L) >90.38 mL/min   Calcium 9.6 8.4 - 10.5 mg/dL

## 2021-09-09 ENCOUNTER — Ambulatory Visit (INDEPENDENT_AMBULATORY_CARE_PROVIDER_SITE_OTHER): Payer: Medicare HMO | Admitting: Family Medicine

## 2021-09-09 ENCOUNTER — Encounter: Payer: Self-pay | Admitting: Family Medicine

## 2021-09-09 VITALS — BP 118/70 | HR 76 | Temp 97.8°F | Resp 18 | Ht 61.0 in | Wt 159.3 lb

## 2021-09-09 DIAGNOSIS — R4689 Other symptoms and signs involving appearance and behavior: Secondary | ICD-10-CM | POA: Diagnosis not present

## 2021-09-09 DIAGNOSIS — E119 Type 2 diabetes mellitus without complications: Secondary | ICD-10-CM | POA: Diagnosis not present

## 2021-09-09 DIAGNOSIS — Z23 Encounter for immunization: Secondary | ICD-10-CM | POA: Diagnosis not present

## 2021-09-09 DIAGNOSIS — I739 Peripheral vascular disease, unspecified: Secondary | ICD-10-CM

## 2021-09-09 DIAGNOSIS — N63 Unspecified lump in unspecified breast: Secondary | ICD-10-CM

## 2021-09-09 LAB — MICROALBUMIN / CREATININE URINE RATIO
Creatinine,U: 212.9 mg/dL
Microalb Creat Ratio: 0.5 mg/g (ref 0.0–30.0)
Microalb, Ur: 1.1 mg/dL (ref 0.0–1.9)

## 2021-09-09 NOTE — Patient Instructions (Signed)
Good to see you again today- I will be in touch with your labs -set up vascular testing for your legs to check on the arteries/ circulation -set up ultrasound of the left breast  Flu shot today  If circulation test is normal we can look further- do an ultrasound of the right leg to check for a Baker's cyst

## 2021-09-10 ENCOUNTER — Encounter: Payer: Self-pay | Admitting: Family Medicine

## 2021-09-10 LAB — CBC
HCT: 39 % (ref 36.0–46.0)
Hemoglobin: 12.6 g/dL (ref 12.0–15.0)
MCHC: 32.3 g/dL (ref 30.0–36.0)
MCV: 86.5 fl (ref 78.0–100.0)
Platelets: 248 10*3/uL (ref 150.0–400.0)
RBC: 4.51 Mil/uL (ref 3.87–5.11)
RDW: 13.9 % (ref 11.5–15.5)
WBC: 8.4 10*3/uL (ref 4.0–10.5)

## 2021-09-10 LAB — COMPREHENSIVE METABOLIC PANEL
ALT: 25 U/L (ref 0–35)
AST: 20 U/L (ref 0–37)
Albumin: 4 g/dL (ref 3.5–5.2)
Alkaline Phosphatase: 79 U/L (ref 39–117)
BUN: 14 mg/dL (ref 6–23)
CO2: 29 mEq/L (ref 19–32)
Calcium: 9.6 mg/dL (ref 8.4–10.5)
Chloride: 104 mEq/L (ref 96–112)
Creatinine, Ser: 1.09 mg/dL (ref 0.40–1.20)
GFR: 51.5 mL/min — ABNORMAL LOW (ref >60.00)
Glucose, Bld: 85 mg/dL (ref 70–99)
Potassium: 4.8 mEq/L (ref 3.5–5.1)
Sodium: 140 mEq/L (ref 135–145)
Total Bilirubin: 0.3 mg/dL (ref 0.2–1.2)
Total Protein: 7.8 g/dL (ref 6.0–8.3)

## 2021-09-10 LAB — HEMOGLOBIN A1C: Hgb A1c MFr Bld: 6.4 % (ref 4.6–6.5)

## 2021-09-13 ENCOUNTER — Ambulatory Visit (HOSPITAL_COMMUNITY): Payer: Medicare HMO

## 2021-09-13 ENCOUNTER — Encounter: Payer: Self-pay | Admitting: Family Medicine

## 2021-09-16 ENCOUNTER — Other Ambulatory Visit: Payer: Self-pay | Admitting: Family Medicine

## 2021-09-23 ENCOUNTER — Ambulatory Visit (HOSPITAL_COMMUNITY)
Admission: RE | Admit: 2021-09-23 | Discharge: 2021-09-23 | Disposition: A | Discharge status: Home / Self Care | Payer: Medicare HMO | Source: Ambulatory Visit | Attending: Vascular Surgery | Admitting: Vascular Surgery

## 2021-09-23 DIAGNOSIS — I739 Peripheral vascular disease, unspecified: Secondary | ICD-10-CM | POA: Insufficient documentation

## 2021-09-24 ENCOUNTER — Encounter: Payer: Self-pay | Admitting: Family Medicine

## 2021-09-24 ENCOUNTER — Other Ambulatory Visit: Payer: Self-pay | Admitting: Family Medicine

## 2021-09-24 DIAGNOSIS — I739 Peripheral vascular disease, unspecified: Secondary | ICD-10-CM

## 2021-10-01 NOTE — Telephone Encounter (Signed)
They said that they called the patient on 9/22. But, also they need another Dx- that this one seems more of a concern for Derm.

## 2021-10-01 NOTE — Addendum Note (Signed)
Addended by: Lamar Blinks C on: 10/01/2021 12:15 PM   Modules accepted: Orders

## 2021-10-01 NOTE — Telephone Encounter (Signed)
Called breast center to troubleshoot- added left breast diag mammo

## 2021-10-04 NOTE — Progress Notes (Signed)
[facility] [place]  ASSESSMENT / PLAN: Heidi [name] is a 71 y.o. female with atherosclerosis of native arteries of right lower extremity causing intermittent claudication.  Patient counseled patients with asymptomatic peripheral arterial disease or claudication have a 1-2% risk of developing chronic limb threatening ischemia, but a 15-30% risk of mortality in the next 5 years. Intervention should only be considered for medically optimized patients with disabling symptoms.   Recommend the following which can slow the progression of atherosclerosis and reduce the risk of major adverse cardiac / limb events:  Complete cessation from all tobacco products. Blood glucose control with goal A1c < 7%. Blood pressure control with goal blood pressure < 140/90 mmHg. Lipid reduction therapy with goal LDL-C <100 mg/dL (<70 if symptomatic from PAD).  Aspirin 81mg  PO QD.  Atorvastatin 40-80mg  PO QD (or other "high intensity" statin therapy). Daily walking to and past the point of discomfort.   Follow-up with me in 3 months with repeat ABI.  CHIEF COMPLAINT: Cramping discomfort in right leg  HISTORY OF PRESENT ILLNESS: Heidi [name] is a 71 y.o. female who presents to clinic for evaluation of cramping discomfort in her right calf with walking.  The patient reports she is fairly active despite this discomfort.  She can get through a grocery shop or walk about a block before her symptoms become bothersome to her.  She does not describe symptoms of ischemic rest pain.  She does not have any ulceration about her feet.  VASCULAR SURGICAL HISTORY: none  VASCULAR RISK FACTORS: Negative history of stroke / transient ischemic attack. Negative history of coronary artery disease.  Positive history of diabetes mellitus. Last A1c 6.2. Negative history of smoking.  Negative history of hypertension.  Negative history of chronic kidney disease.   Negative history of chronic obstructive  pulmonary disease.  FUNCTIONAL STATUS: ECOG performance status: (1) Restricted in physically strenuous activity, ambulatory and able to do work of light nature Ambulatory status: Ambulatory within the community with limits  Past Medical History:  Diagnosis Date   Allergic rhinitis due to pollen    Allergy    Anemia, unspecified    Blood transfusion without reported diagnosis    1979   Disorder of bone and cartilage, unspecified    Eosinophilia    Headache 09/24/2016   Lymphocytosis (symptomatic)    Osteoporosis    Other abnormal blood chemistry    Other and unspecified hyperlipidemia    Pain in joint, shoulder region    Reflux esophagitis    Type II or unspecified type diabetes mellitus without mention of complication, uncontrolled    Umbilical hernia without mention of obstruction or gangrene    Unspecified disorder of liver     Past Surgical History:  Procedure Laterality Date   CATARACT EXTRACTION, BILATERAL     CESAREAN SECTION     COLONOSCOPY  2015   Dr. Deatra Ina, normal exam    Family History  Problem Relation Age of Onset   Pancreatic cancer Neg Hx    Colon cancer Neg Hx    Esophageal cancer Neg Hx    Stomach cancer Neg Hx     Social History   Socioeconomic History   Marital status: Married    Spouse name: Bampia   Number of children: 4   Years of education: BSN   Highest education level: Not on file  Occupational History   Occupation: Programmer, multimedia: [employer]  Tobacco Use   Smoking status: Never  Smokeless tobacco: Never  Substance and Sexual Activity   Alcohol use: No   Drug use: No   Sexual activity: Not on file  Other Topics Concern   Not on file  Social History Narrative   Married   Never smoked   Alcohol none   Exercise walk 2-3 times week   Children 4   BS Nursing , retired         Investment banker, operational of Radio broadcast assistant Strain: [place]  (02/12/2021)   Overall Financial Resource Strain (CARDIA)     Difficulty of Paying Living Expenses: Not hard at all  Food Insecurity: No Food Insecurity (02/12/2021)   Hunger Vital Sign    Worried About Running Out of Food in the Last Year: Never true    [place] in the Last Year: Never true  Transportation Needs: No Transportation Needs (02/12/2021)   PRAPARE - Hydrologist (Medical): No    Lack of Transportation (Non-Medical): No  Physical Activity: Unknown (02/12/2021)   Exercise Vital Sign    Days of Exercise per Week: Not on file    Minutes of Exercise per Session: 30 min  Stress: No Stress Concern Present (02/12/2021)   [place]    Feeling of Stress : Not at all  Social Connections: Moderately Integrated (02/12/2021)   Social Connection and Isolation Panel [NHANES]    Frequency of Communication with Friends and Family: More than three times a week    Frequency of Social Gatherings with Friends and Family: Once a week    Attends Religious Services: More than 4 times per year    Active Member of [employer] or Organizations: No    Attends Archivist Meetings: Never    Marital Status: Married  Human resources officer Violence: Not At Risk (02/12/2021)   Humiliation, Afraid, Rape, and Kick questionnaire    Fear of Current or Ex-Partner: No    Emotionally Abused: No    Physically Abused: No    Sexually Abused: No    Allergies  Allergen Reactions   Ciprofloxacin Anaphylaxis    Patient states she gets "CDIFF" Family states this is not correct and this is not an allergy    Current Outpatient Medications  Medication Sig Dispense Refill   calcium-vitamin D (OSCAL WITH D) 500-200 MG-UNIT per tablet Take 1 tablet by mouth daily.     CEQUA 0.09 % SOLN Apply 1 drop to eye 2 (two) times daily.     cetirizine (ZYRTEC) 10 MG chewable tablet Chew 10 mg by mouth daily.     hydrocortisone (CORTEF) 10 MG tablet Take 1 tablet (10 mg total) by mouth 2 (two)  times daily. (Patient taking differently: Take 10 mg by mouth 2 (two) times daily. One tablet (10 mg) in morning and one half (5mg ) at night) 30 tablet 0   meloxicam (MOBIC) 7.5 MG tablet Take 1 tablet (7.5 mg total) by mouth daily as needed for pain. 14 tablet 0   Multiple Vitamin (MULTIVITAMIN) tablet Take 1 tablet by mouth daily.     simvastatin (ZOCOR) 20 MG tablet TAKE 1 TABLET BY MOUTH AT BEDTIME 90 tablet 1   traZODone (DESYREL) 50 MG tablet TAKE 1/2 TO 1 (ONE-HALF TO ONE) TABLET BY MOUTH AT BEDTIME FOR SLEEP 90 tablet 0   No current facility-administered medications for this visit.    PHYSICAL EXAM Vitals:   10/05/21 1001  BP: 117/81  Pulse: 70  Resp: 20  Temp: 97.9 F (36.6 C)  SpO2: 97%  Weight: 159 lb (72.1 kg)  Height: 5\' 1"  (1.549 m)   Well-appearing elderly woman in no acute distress Regular rate and rhythm Unlabored breathing No palpable pedal pulses in the right lower extremity.    PERTINENT LABORATORY AND RADIOLOGIC DATA  Most recent CBC    Latest Ref Rng & Units 09/09/2021    1:47 PM 04/14/2021    3:49 PM 03/29/2021    1:25 PM  CBC  WBC 4.0 - 10.5 K/uL 8.4  7.4  7.4   Hemoglobin 12.0 - 15.0 g/dL 12.6  12.9  12.6   Hematocrit 36.0 - 46.0 % 39.0  39.8  38.3   Platelets 150.0 - 400.0 K/uL 248.0  247.0  251.0      Most recent CMP    Latest Ref Rng & Units 09/09/2021    1:47 PM 04/14/2021    3:49 PM 03/29/2021    1:25 PM  CMP  Glucose 70 - 99 mg/dL 85  84  92   BUN 6 - 23 mg/dL 14  15  11    Creatinine 0.40 - 1.20 mg/dL 1.09  0.95  1.05   Sodium 135 - 145 mEq/L 140  138  138   Potassium 3.5 - 5.1 mEq/L 4.8  4.2  4.3   Chloride 96 - 112 mEq/L 104  103  104   CO2 19 - 32 mEq/L 29  28  27    Calcium 8.4 - 10.5 mg/dL 9.6  9.2  9.6   Total Protein 6.0 - 8.3 g/dL 7.8  7.7  7.6   Total Bilirubin 0.2 - 1.2 mg/dL 0.3  0.3  0.3   Alkaline Phos 39 - 117 U/L 79  80  84   AST 0 - 37 U/L 20  20  21    ALT 0 - 35 U/L 25  25  25      Renal function CrCl cannot be  calculated (Patient's most recent lab result is older than the maximum 21 days allowed.).  Hgb A1c MFr Bld (%)  Date Value  09/09/2021 6.4    LDL Calculated  Date Value Ref Range Status  06/25/2013 145 (H) 0 - 99 mg/dL Final   LDL Cholesterol  Date Value Ref Range Status  03/29/2021 100 (H) 0 - 99 mg/dL Final    +-------+-----------+-----------+------------+------------+  ABI/TBIToday's ABIToday's TBIPrevious ABIPrevious TBI  +-------+-----------+-----------+------------+------------+  Right  0.72       0.51                                 +-------+-----------+-----------+------------+------------+  Left   1.00       0.65                                 +-------+-----------+-----------+------------+------------+  Yevonne Aline. Stanford Breed, MD [facility] Phone Number: [phone] 10/05/2021 4:55 PM  Total time spent on preparing this encounter including chart review, data review, collecting history, examining the patient, coordinating care for this new patient, 60 minutes.  Portions of this report may have been transcribed using voice recognition software.  Every effort has been made to ensure accuracy; however, inadvertent computerized transcription errors may still be present.

## 2021-10-05 ENCOUNTER — Ambulatory Visit: Payer: Medicare HMO | Admitting: Vascular Surgery

## 2021-10-05 ENCOUNTER — Other Ambulatory Visit: Payer: Self-pay

## 2021-10-05 ENCOUNTER — Encounter: Payer: Self-pay | Admitting: Vascular Surgery

## 2021-10-05 VITALS — BP 117/81 | HR 70 | Temp 97.9°F | Resp 20 | Ht 61.0 in | Wt 159.0 lb

## 2021-10-05 DIAGNOSIS — I70211 Atherosclerosis of native arteries of extremities with intermittent claudication, right leg: Secondary | ICD-10-CM | POA: Diagnosis not present

## 2021-10-05 DIAGNOSIS — I739 Peripheral vascular disease, unspecified: Secondary | ICD-10-CM

## 2021-10-14 DIAGNOSIS — E119 Type 2 diabetes mellitus without complications: Secondary | ICD-10-CM | POA: Diagnosis not present

## 2021-10-14 DIAGNOSIS — E2740 Unspecified adrenocortical insufficiency: Secondary | ICD-10-CM | POA: Diagnosis not present

## 2021-10-14 DIAGNOSIS — E236 Other disorders of pituitary gland: Secondary | ICD-10-CM | POA: Diagnosis not present

## 2021-10-15 IMAGING — CT CT ABD-PELV W/ CM
2 of 5 series · 16 of 46 positions shown, 18 images · IV contrast (Omnipaque)
Comparison: CT December 10, 2003.

CLINICAL DATA: Right lower quadrant abdominal pain intermittent x 6
months

EXAM:
CT ABDOMEN AND PELVIS WITH CONTRAST
TECHNIQUE: Multidetector CT imaging of the abdomen and pelvis was performed
using the standard protocol following bolus administration of
intravenous contrast.
CONTRAST:  85mL OMNIPAQUE IOHEXOL 350 MG/ML SOLN

[Series 2: axial st · axial · 0.96mm/px · z∈[-405,-45]mm · 13 of 82 slices shown, 15 images]
[im 5/82  soft-tissue]
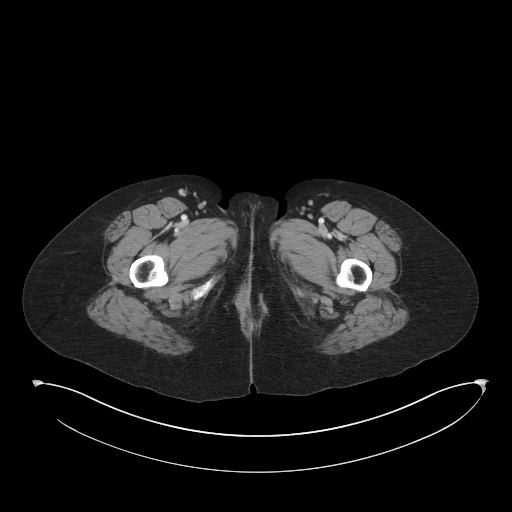
[im 5/82  bone]
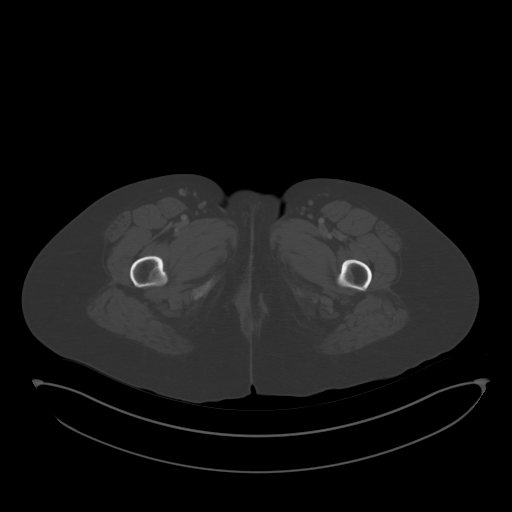
[im 13/82  soft-tissue]
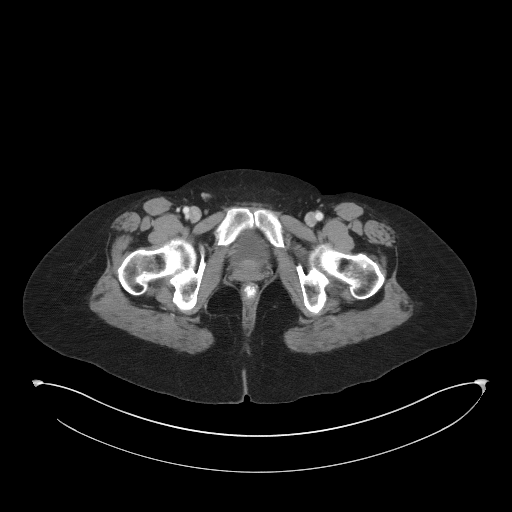
[im 18/82  soft-tissue]
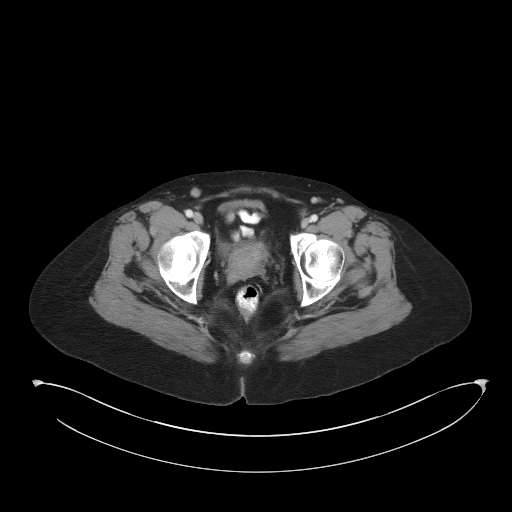
[im 22/82  soft-tissue]
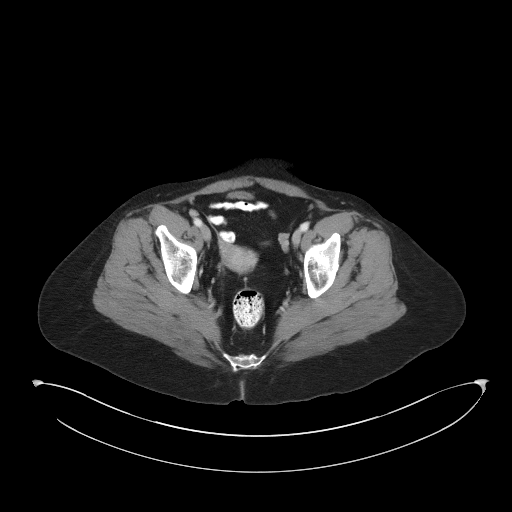
[im 30/82  soft-tissue]
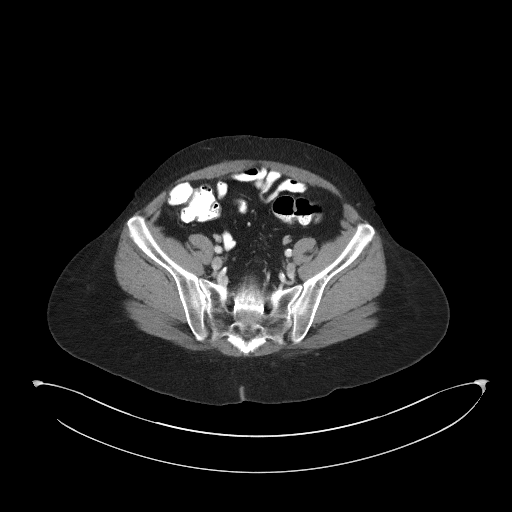
[im 35/82  soft-tissue]
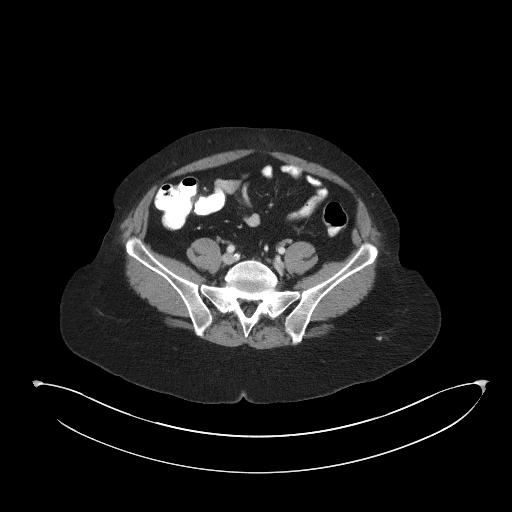
[im 43/82  soft-tissue]
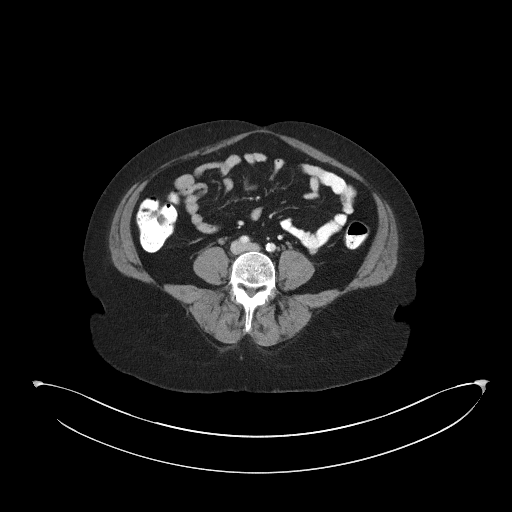
[im 47/82  soft-tissue]
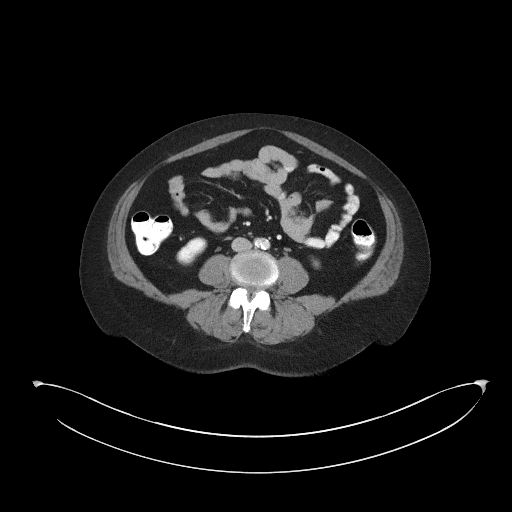
[im 52/82  soft-tissue]
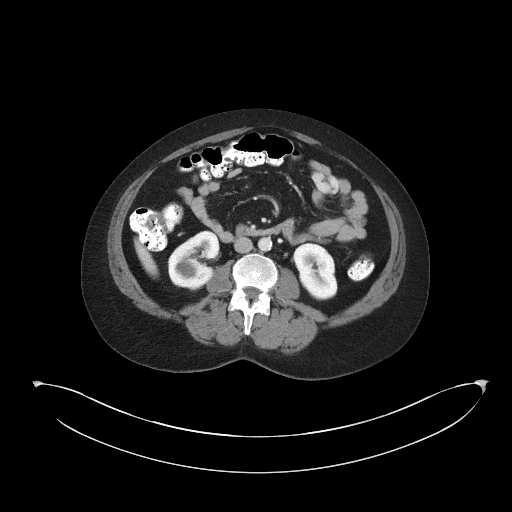
[im 52/82  bone]
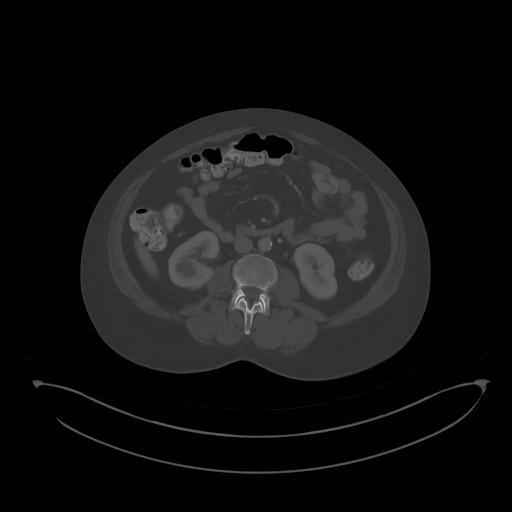
[im 60/82  soft-tissue]
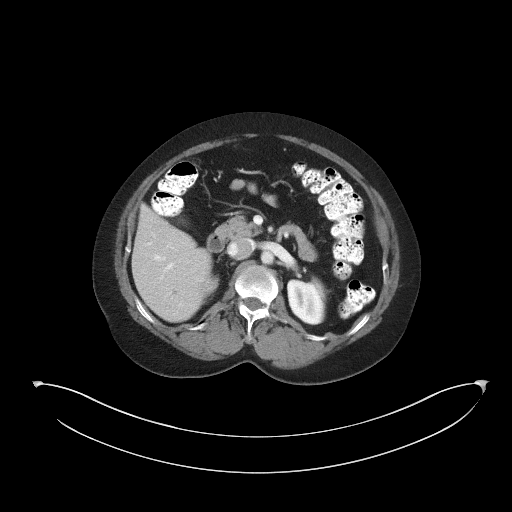
[im 64/82  soft-tissue]
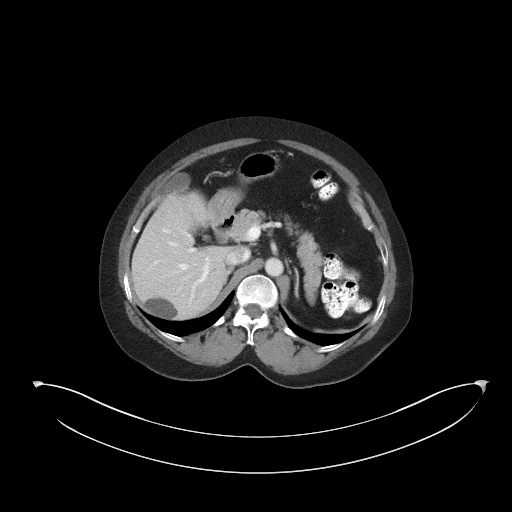
[im 69/82  soft-tissue]
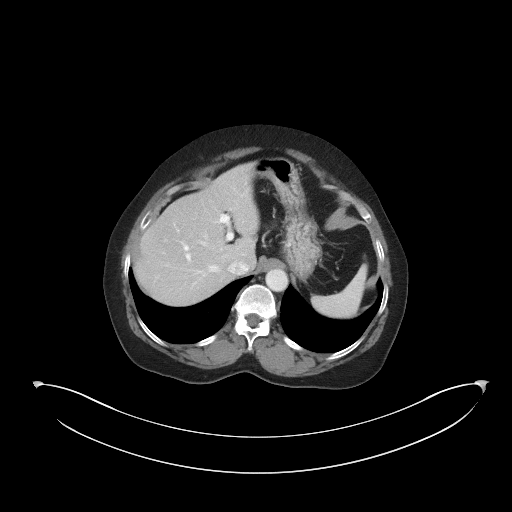
[im 77/82  soft-tissue]
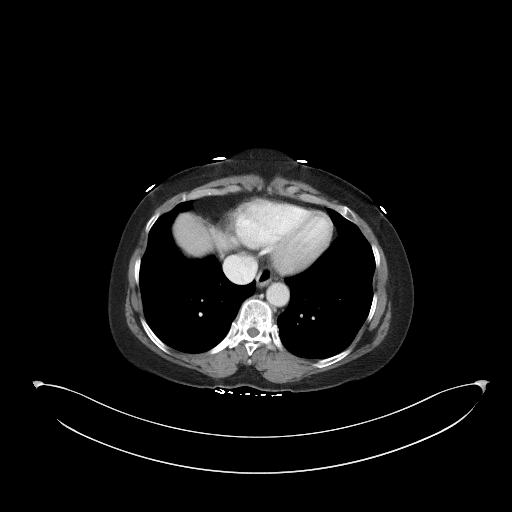

[Series 5: coronal st · coronal · 0.79mm/px · 3 of 94 slices shown]
[im 32/94  soft-tissue]
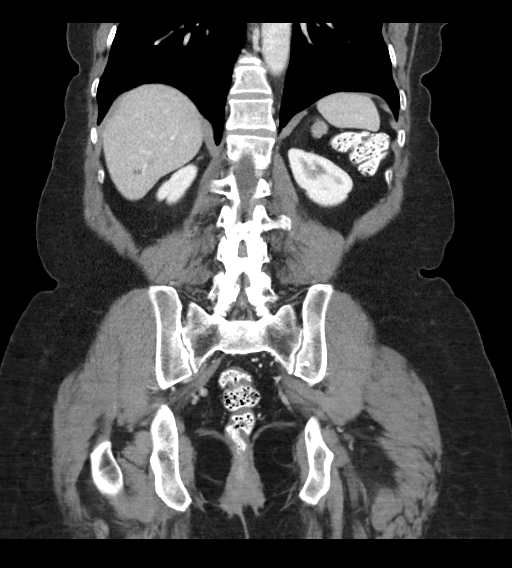
[im 42/94  soft-tissue]
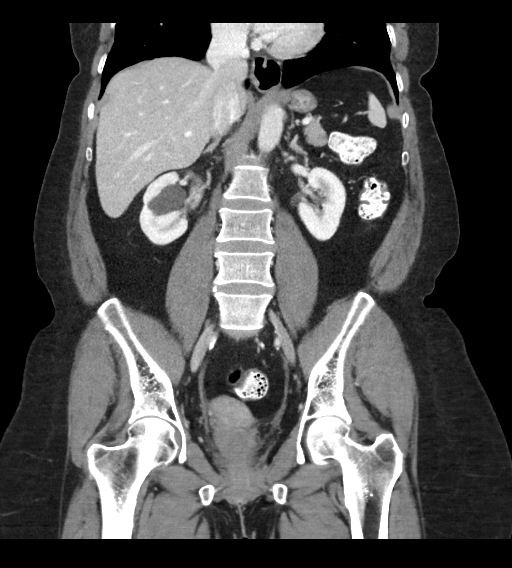
[im 52/94  soft-tissue]
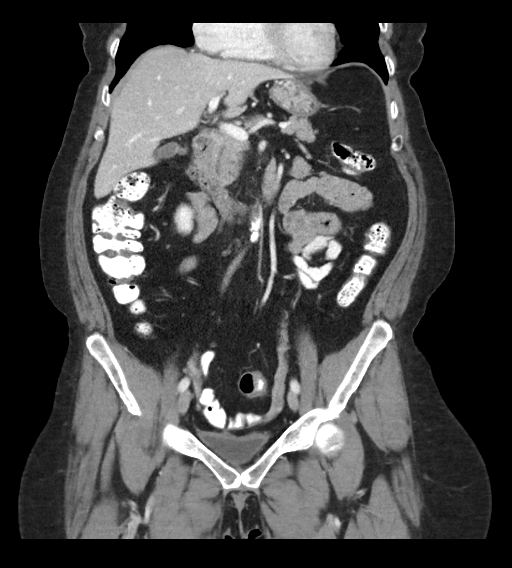

[16 of 46 positions shown; findings below may reference images not displayed]

FINDINGS: Lower chest: No acute abnormality.

Hepatobiliary: Fluid density 3.2 cm lesion in the posterior right
lobe of the liver, consistent with a cyst. There are additional
subcentimeter bilobar hypodense hepatic lesions which are
technically too small to accurately characterize but statistically
likely to represent cysts. There is transposition of the gallbladder
fundus anterior to the liver with interposition of the gallbladder
fundus between the hepatic parenchyma and the anterior abdominal
wall. No cholelithiasis or evidence of acute inflammation. No
biliary ductal dilation.

Pancreas: Unremarkable. No pancreatic ductal dilatation or
surrounding inflammatory changes.

Spleen: Normal in size without focal abnormality.

Adrenals/Urinary Tract: Bilateral adrenal glands are unremarkable.
No hydronephrosis. 2.6 cm right renal cyst. Urinary bladder is
decompressed.

Stomach/Bowel: Radiopaque enteric contrast traverses the rectum.
Small-moderate hiatal hernia otherwise stomach is unremarkable. No
pathologic dilation of small or large bowel. The appendix is not
confidently identified however there is no pericecal inflammation.
Terminal ileum appears normal. No evidence of acute bowel
inflammation.

Vascular/Lymphatic: Aortic and branch vessel atherosclerosis without
abdominal aortic aneurysm. No pathologically enlarged abdominal or
pelvic lymph nodes.

Reproductive: Uterus and bilateral adnexa are unremarkable.

Other: No significant abdominopelvic ascites. Small fat containing
paraumbilical hernia.

Musculoskeletal: L4-L5 discogenic disease. Lumbar facet hypertrophy.
Chronic changes of osteitis pubis. No acute osseous abnormality.
IMPRESSION: 1. No acute findings in the abdomen or pelvis.
2. Small-moderate hiatal hernia.
3.  Aortic Atherosclerosis ([id number]).

## 2021-10-21 DIAGNOSIS — E119 Type 2 diabetes mellitus without complications: Secondary | ICD-10-CM | POA: Diagnosis not present

## 2021-10-21 DIAGNOSIS — E871 Hypo-osmolality and hyponatremia: Secondary | ICD-10-CM | POA: Diagnosis not present

## 2021-10-21 DIAGNOSIS — E236 Other disorders of pituitary gland: Secondary | ICD-10-CM | POA: Diagnosis not present

## 2021-10-21 DIAGNOSIS — E2740 Unspecified adrenocortical insufficiency: Secondary | ICD-10-CM | POA: Diagnosis not present

## 2021-10-21 DIAGNOSIS — E559 Vitamin D deficiency, unspecified: Secondary | ICD-10-CM | POA: Diagnosis not present

## 2021-10-21 DIAGNOSIS — G919 Hydrocephalus, unspecified: Secondary | ICD-10-CM | POA: Diagnosis not present

## 2021-10-21 DIAGNOSIS — Z7952 Long term (current) use of systemic steroids: Secondary | ICD-10-CM | POA: Diagnosis not present

## 2021-10-21 DIAGNOSIS — M810 Age-related osteoporosis without current pathological fracture: Secondary | ICD-10-CM | POA: Diagnosis not present

## 2021-11-23 DIAGNOSIS — M810 Age-related osteoporosis without current pathological fracture: Secondary | ICD-10-CM | POA: Diagnosis not present

## 2021-12-31 ENCOUNTER — Other Ambulatory Visit: Payer: Self-pay | Admitting: Family Medicine

## 2021-12-31 DIAGNOSIS — E782 Mixed hyperlipidemia: Secondary | ICD-10-CM

## 2022-01-03 NOTE — Progress Notes (Unsigned)
[facility] [place]  ASSESSMENT / PLAN: Heidi [name] is a 72 y.o. female with atherosclerosis of native arteries of right lower extremity causing intermittent claudication.  Patient counseled patients with asymptomatic peripheral arterial disease or claudication have a 1-2% risk of developing chronic limb threatening ischemia, but a 15-30% risk of mortality in the next 5 years. Intervention should only be considered for medically optimized patients with disabling symptoms.   Recommend the following which can slow the progression of atherosclerosis and reduce the risk of major adverse cardiac / limb events:  Complete cessation from all tobacco products. Blood glucose control with goal A1c < 7%. Blood pressure control with goal blood pressure < 140/90 mmHg. Lipid reduction therapy with goal LDL-C <100 mg/dL (<70 if symptomatic from PAD).  Aspirin 81mg  PO QD.  Atorvastatin 40-80mg  PO QD (or other "high intensity" statin therapy). Daily walking to and past the point of discomfort.   Follow-up with me in 3 months with repeat ABI.  CHIEF COMPLAINT: Cramping discomfort in right leg  HISTORY OF PRESENT ILLNESS: Heidi [name] is a 72 y.o. female who presents to clinic for evaluation of cramping discomfort in her right calf with walking.  The patient reports she is fairly active despite this discomfort.  She can get through a grocery shop or walk about a block before her symptoms become bothersome to her.  She does not describe symptoms of ischemic rest pain.  She does not have any ulceration about her feet.  01/04/21: ***  VASCULAR SURGICAL HISTORY: none  VASCULAR RISK FACTORS: Negative history of stroke / transient ischemic attack. Negative history of coronary artery disease.  Positive history of diabetes mellitus. Last A1c 6.2. Negative history of smoking.  Negative history of hypertension.  Negative history of chronic kidney disease.   Negative history of  chronic obstructive pulmonary disease.  FUNCTIONAL STATUS: ECOG performance status: (1) Restricted in physically strenuous activity, ambulatory and able to do work of light nature Ambulatory status: Ambulatory within the community with limits  Past Medical History:  Diagnosis Date   Allergic rhinitis due to pollen    Allergy    Anemia, unspecified    Blood transfusion without reported diagnosis    1979   Disorder of bone and cartilage, unspecified    Eosinophilia    Headache 09/24/2016   Lymphocytosis (symptomatic)    Osteoporosis    Other abnormal blood chemistry    Other and unspecified hyperlipidemia    Pain in joint, shoulder region    Reflux esophagitis    Type II or unspecified type diabetes mellitus without mention of complication, uncontrolled    Umbilical hernia without mention of obstruction or gangrene    Unspecified disorder of liver     Past Surgical History:  Procedure Laterality Date   CATARACT EXTRACTION, BILATERAL     CESAREAN SECTION     COLONOSCOPY  2015   Dr. Deatra Ina, normal exam    Family History  Problem Relation Age of Onset   Pancreatic cancer Neg Hx    Colon cancer Neg Hx    Esophageal cancer Neg Hx    Stomach cancer Neg Hx     Social History   Socioeconomic History   Marital status: Married    Spouse name: Heidi [name]   Number of children: 4   Years of education: BSN   Highest education level: Not on file  Occupational History   Occupation: Programmer, multimedia: [employer]  Tobacco Use  Smoking status: Never   Smokeless tobacco: Never  Substance and Sexual Activity   Alcohol use: No   Drug use: No   Sexual activity: Not on file  Other Topics Concern   Not on file  Social History Narrative   Married   Never smoked   Alcohol none   Exercise walk 2-3 times week   Children 4   BS Nursing , retired         Investment banker, operational of Radio broadcast assistant Strain: [place]  (02/12/2021)   Overall Financial Resource  Strain (CARDIA)    Difficulty of Paying Living Expenses: Not hard at all  Food Insecurity: No Food Insecurity (02/12/2021)   Hunger Vital Sign    Worried About Running Out of Food in the Last Year: Never true    [place] in the Last Year: Never true  Transportation Needs: No Transportation Needs (02/12/2021)   PRAPARE - Hydrologist (Medical): No    Lack of Transportation (Non-Medical): No  Physical Activity: Unknown (02/12/2021)   Exercise Vital Sign    Days of Exercise per Week: Not on file    Minutes of Exercise per Session: 30 min  Stress: No Stress Concern Present (02/12/2021)   [place]    Feeling of Stress : Not at all  Social Connections: Moderately Integrated (02/12/2021)   Social Connection and Isolation Panel [NHANES]    Frequency of Communication with Friends and Family: More than three times a week    Frequency of Social Gatherings with Friends and Family: Once a week    Attends Religious Services: More than 4 times per year    Active Member of [employer] or Organizations: No    Attends Archivist Meetings: Never    Marital Status: Married  Human resources officer Violence: Not At Risk (02/12/2021)   Humiliation, Afraid, Rape, and Kick questionnaire    Fear of Current or Ex-Partner: No    Emotionally Abused: No    Physically Abused: No    Sexually Abused: No    Allergies  Allergen Reactions   Ciprofloxacin Anaphylaxis    Patient states she gets "CDIFF" Family states this is not correct and this is not an allergy    Current Outpatient Medications  Medication Sig Dispense Refill   calcium-vitamin D (OSCAL WITH D) 500-200 MG-UNIT per tablet Take 1 tablet by mouth daily.     CEQUA 0.09 % SOLN Apply 1 drop to eye 2 (two) times daily.     cetirizine (ZYRTEC) 10 MG chewable tablet Chew 10 mg by mouth daily.     hydrocortisone (CORTEF) 10 MG tablet Take 1 tablet (10 mg  total) by mouth 2 (two) times daily. (Patient taking differently: Take 10 mg by mouth 2 (two) times daily. One tablet (10 mg) in morning and one half (5mg ) at night) 30 tablet 0   meloxicam (MOBIC) 7.5 MG tablet Take 1 tablet (7.5 mg total) by mouth daily as needed for pain. 14 tablet 0   Multiple Vitamin (MULTIVITAMIN) tablet Take 1 tablet by mouth daily.     simvastatin (ZOCOR) 20 MG tablet TAKE 1 TABLET BY MOUTH AT BEDTIME 90 tablet 1   traZODone (DESYREL) 50 MG tablet TAKE 1/2 TO 1 (ONE-HALF TO ONE) TABLET BY MOUTH AT BEDTIME FOR SLEEP 90 tablet 0   No current facility-administered medications for this visit.    PHYSICAL EXAM There were no vitals  filed for this visit.  Well-appearing elderly woman in no acute distress Regular rate and rhythm Unlabored breathing No palpable pedal pulses in the right lower extremity.    PERTINENT LABORATORY AND RADIOLOGIC DATA  Most recent CBC    Latest Ref Rng & Units 09/09/2021    1:47 PM 04/14/2021    3:49 PM 03/29/2021    1:25 PM  CBC  WBC 4.0 - 10.5 K/uL 8.4  7.4  7.4   Hemoglobin 12.0 - 15.0 g/dL 12.6  12.9  12.6   Hematocrit 36.0 - 46.0 % 39.0  39.8  38.3   Platelets 150.0 - 400.0 K/uL 248.0  247.0  251.0      Most recent CMP    Latest Ref Rng & Units 09/09/2021    1:47 PM 04/14/2021    3:49 PM 03/29/2021    1:25 PM  CMP  Glucose 70 - 99 mg/dL 85  84  92   BUN 6 - 23 mg/dL 14  15  11    Creatinine 0.40 - 1.20 mg/dL 1.09  0.95  1.05   Sodium 135 - 145 mEq/L 140  138  138   Potassium 3.5 - 5.1 mEq/L 4.8  4.2  4.3   Chloride 96 - 112 mEq/L 104  103  104   CO2 19 - 32 mEq/L 29  28  27    Calcium 8.4 - 10.5 mg/dL 9.6  9.2  9.6   Total Protein 6.0 - 8.3 g/dL 7.8  7.7  7.6   Total Bilirubin 0.2 - 1.2 mg/dL 0.3  0.3  0.3   Alkaline Phos 39 - 117 U/L 79  80  84   AST 0 - 37 U/L 20  20  21    ALT 0 - 35 U/L 25  25  25      Renal function CrCl cannot be calculated (Patient's most recent lab result is older than the maximum 21 days  allowed.).  Hgb A1c MFr Bld (%)  Date Value  09/09/2021 6.4    LDL Calculated  Date Value Ref Range Status  06/25/2013 145 (H) 0 - 99 mg/dL Final   LDL Cholesterol  Date Value Ref Range Status  03/29/2021 100 (H) 0 - 99 mg/dL Final    +-------+-----------+-----------+------------+------------+  ABI/TBIToday's ABIToday's TBIPrevious ABIPrevious TBI  +-------+-----------+-----------+------------+------------+  Right  0.72       0.51                                 +-------+-----------+-----------+------------+------------+  Left   1.00       0.65                                 +-------+-----------+-----------+------------+------------+  Yevonne Aline. Stanford Breed, MD [facility] Phone Number: [phone] 01/03/2022 12:16 PM  Total time spent on preparing this encounter including chart review, data review, collecting history, examining the patient, coordinating care for this new patient, 60 minutes.  Portions of this report may have been transcribed using voice recognition software.  Every effort has been made to ensure accuracy; however, inadvertent computerized transcription errors may still be present.

## 2022-01-04 ENCOUNTER — Ambulatory Visit (HOSPITAL_COMMUNITY)
Admission: RE | Admit: 2022-01-04 | Discharge: 2022-01-04 | Disposition: A | Discharge status: Home / Self Care | Payer: Medicare HMO | Source: Ambulatory Visit | Attending: Vascular Surgery | Admitting: Vascular Surgery

## 2022-01-04 ENCOUNTER — Encounter: Payer: Self-pay | Admitting: Vascular Surgery

## 2022-01-04 ENCOUNTER — Ambulatory Visit: Payer: Medicare HMO | Admitting: Vascular Surgery

## 2022-01-04 VITALS — BP 143/84 | HR 67 | Temp 98.0°F | Resp 20 | Ht 61.0 in | Wt 163.0 lb

## 2022-01-04 DIAGNOSIS — I739 Peripheral vascular disease, unspecified: Secondary | ICD-10-CM | POA: Insufficient documentation

## 2022-01-09 NOTE — Progress Notes (Unsigned)
[facility] at [facility] [address], [address] [place], [place] [address] [phone]  Date:  01/12/2022   Name:  Heidi [name]   DOB:  04/12/1950   MRN:  [id number]  PCP:  Pearline Cables, MD    Chief Complaint: No chief complaint on file.   History of Present Illness:  Heidi [name] is a 72 y.o. very pleasant female patient who presents with the following:  Patient seen today with concern of needing a breast ultrasound History of well-controlled diabetes, hyponatremia, anemia, adrenal insufficiency, memory loss Her endocrinologist is Dr. Talmage Nap who treats her adrenal insufficiency   I last saw her in September at which time she had concern of a breast change-she seemed to have a scar on her left breast likely from her recent skin lesion Mammogram was done in July of last year-negative  She is also being seen by vascular surgery for peripheral arterial disease-most recent visit was last week She had right lower extremity atherosclerosis, intermittent claudication, doing better with medical therapy  Recommend tetanus, COVID booster at her pharmacy Eye exam Patient Active Problem List   Diagnosis Date Noted   Impingement syndrome of left shoulder 10/09/2019   Osteopenia 10/03/2019   Memory loss 06/23/2017   Gait abnormality 06/23/2017   Uterine prolapse 09/24/2016   Headache 09/24/2016   Hyponatremia 03/03/2015   Special screening for malignant neoplasms, colon 12/10/2012   Lymphocytosis (symptomatic)    Disorder of bone and cartilage, unspecified    Type II or unspecified type diabetes mellitus without mention of complication, uncontrolled    Reflux esophagitis    Anemia, unspecified    Other and unspecified hyperlipidemia    Lymphocytosis 01/16/2012   Idiopathic eosinophilia 04/05/2011   NONSPECIFIC ABNORMAL RESULTS LIVR FUNCTION STUDY 08/15/2008   ANEMIA, HX OF 08/15/2008    Past Medical History:  Diagnosis Date    Allergic rhinitis due to pollen    Allergy    Anemia, unspecified    Blood transfusion without reported diagnosis    1979   Disorder of bone and cartilage, unspecified    Eosinophilia    Headache 09/24/2016   Lymphocytosis (symptomatic)    Osteoporosis    Other abnormal blood chemistry    Other and unspecified hyperlipidemia    Pain in joint, shoulder region    Reflux esophagitis    Type II or unspecified type diabetes mellitus without mention of complication, uncontrolled    Umbilical hernia without mention of obstruction or gangrene    Unspecified disorder of liver     Past Surgical History:  Procedure Laterality Date   CATARACT EXTRACTION, BILATERAL     CESAREAN SECTION     COLONOSCOPY  2015   Dr. Arlyce Dice, normal exam    Social History   Tobacco Use   Smoking status: Never   Smokeless tobacco: Never  Substance Use Topics   Alcohol use: No   Drug use: No    Family History  Problem Relation Age of Onset   Pancreatic cancer Neg Hx    Colon cancer Neg Hx    Esophageal cancer Neg Hx    Stomach cancer Neg Hx     Allergies  Allergen Reactions   Ciprofloxacin Anaphylaxis    Patient states she gets "CDIFF" Family states this is not correct and this is not an allergy   Alendronate Sodium     Other Reaction(s): Unknown    Medication list has been reviewed and updated.  Current Outpatient Medications on File Prior to Visit  Medication Sig Dispense Refill   calcium-vitamin D (OSCAL WITH D) 500-200 MG-UNIT per tablet Take 1 tablet by mouth daily.     CEQUA 0.09 % SOLN Apply 1 drop to eye 2 (two) times daily.     cetirizine (ZYRTEC) 10 MG chewable tablet Chew 10 mg by mouth daily.     hydrocortisone (CORTEF) 10 MG tablet Take 1 tablet (10 mg total) by mouth 2 (two) times daily. (Patient taking differently: Take 10 mg by mouth 2 (two) times daily. One tablet (10 mg) in morning and one half (5mg ) at night) 30 tablet 0   meloxicam (MOBIC) 7.5 MG tablet Take 1 tablet (7.5  mg total) by mouth daily as needed for pain. 14 tablet 0   Multiple Vitamin (MULTIVITAMIN) tablet Take 1 tablet by mouth daily.     simvastatin (ZOCOR) 20 MG tablet TAKE 1 TABLET BY MOUTH AT BEDTIME 90 tablet 0   traZODone (DESYREL) 50 MG tablet TAKE 1/2 TO 1 (ONE-HALF TO ONE) TABLET BY MOUTH AT BEDTIME FOR SLEEP 90 tablet 0   No current facility-administered medications on file prior to visit.    Review of Systems:  ***  Physical Examination: There were no vitals filed for this visit. There were no vitals filed for this visit. There is no height or weight on file to calculate BMI. Ideal Body Weight:    ***  Assessment and Plan: ***  Signed Lamar Blinks, MD

## 2022-01-09 NOTE — Patient Instructions (Incomplete)
It was good to see you again today Recommend getting a COVID booster and tetanus booster at your pharmacy I ordered a CT of your abdomen to make sure no concerning cause for your abdominal distention Re-ordered ultrasound and mammogram of the left breast

## 2022-01-12 ENCOUNTER — Encounter: Payer: Self-pay | Admitting: Family Medicine

## 2022-01-12 ENCOUNTER — Ambulatory Visit (INDEPENDENT_AMBULATORY_CARE_PROVIDER_SITE_OTHER): Payer: Medicare HMO | Admitting: Family Medicine

## 2022-01-12 VITALS — BP 122/70 | HR 70 | Temp 97.9°F | Resp 18 | Ht 61.0 in | Wt 164.2 lb

## 2022-01-12 DIAGNOSIS — R140 Abdominal distension (gaseous): Secondary | ICD-10-CM | POA: Diagnosis not present

## 2022-01-12 DIAGNOSIS — N644 Mastodynia: Secondary | ICD-10-CM | POA: Diagnosis not present

## 2022-01-12 LAB — COMPREHENSIVE METABOLIC PANEL
ALT: 33 U/L (ref 0–35)
AST: 23 U/L (ref 0–37)
Albumin: 3.9 g/dL (ref 3.5–5.2)
Alkaline Phosphatase: 77 U/L (ref 39–117)
BUN: 11 mg/dL (ref 6–23)
CO2: 30 mEq/L (ref 19–32)
Calcium: 9 mg/dL (ref 8.4–10.5)
Chloride: 105 mEq/L (ref 96–112)
Creatinine, Ser: 1.02 mg/dL (ref 0.40–1.20)
GFR: 55.64 mL/min — ABNORMAL LOW (ref >60.00)
Glucose, Bld: 106 mg/dL — ABNORMAL HIGH (ref 70–99)
Potassium: 3.8 mEq/L (ref 3.5–5.1)
Sodium: 141 mEq/L (ref 135–145)
Total Bilirubin: 0.4 mg/dL (ref 0.2–1.2)
Total Protein: 7.4 g/dL (ref 6.0–8.3)

## 2022-01-12 LAB — CBC
HCT: 38.6 % (ref 36.0–46.0)
Hemoglobin: 12.5 g/dL (ref 12.0–15.0)
MCHC: 32.5 g/dL (ref 30.0–36.0)
MCV: 86.1 fl (ref 78.0–100.0)
Platelets: 283 10*3/uL (ref 150.0–400.0)
RBC: 4.48 Mil/uL (ref 3.87–5.11)
RDW: 13.4 % (ref 11.5–15.5)
WBC: 7.3 10*3/uL (ref 4.0–10.5)

## 2022-01-26 ENCOUNTER — Ambulatory Visit (HOSPITAL_COMMUNITY): Payer: Medicare HMO

## 2022-02-14 ENCOUNTER — Ambulatory Visit: Payer: Medicare HMO

## 2022-02-16 ENCOUNTER — Ambulatory Visit: Payer: Medicare HMO

## 2022-02-21 ENCOUNTER — Ambulatory Visit (HOSPITAL_COMMUNITY)
Admission: RE | Admit: 2022-02-21 | Discharge: 2022-02-21 | Disposition: A | Discharge status: Home / Self Care | Payer: Medicare HMO | Source: Ambulatory Visit | Attending: Family Medicine | Admitting: Family Medicine

## 2022-02-21 DIAGNOSIS — N281 Cyst of kidney, acquired: Secondary | ICD-10-CM | POA: Diagnosis not present

## 2022-02-21 DIAGNOSIS — R140 Abdominal distension (gaseous): Secondary | ICD-10-CM | POA: Diagnosis not present

## 2022-02-21 DIAGNOSIS — K3184 Gastroparesis: Secondary | ICD-10-CM | POA: Diagnosis not present

## 2022-02-21 MED ORDER — IOHEXOL 300 MG/ML  SOLN
100.0000 mL | Freq: Once | INTRAMUSCULAR | Status: AC | PRN
Start: 2022-02-21 — End: 2022-02-21
  Administered 2022-02-21: 100 mL via INTRAVENOUS

## 2022-02-21 MED ORDER — SODIUM CHLORIDE (PF) 0.9 % IJ SOLN
INTRAMUSCULAR | Status: AC
  Filled 2022-02-21: qty 50

## 2022-02-22 NOTE — Patient Instructions (Addendum)
Good to see you again today Recommend covid booster and tetanus booster at your pharmacy  Be sure you get your eyes checked annually I will be in touch with your A1c / diabetes test Let me know if your hearing does not seem back to normal

## 2022-02-22 NOTE — Progress Notes (Addendum)
[facility] [place], [place], [place] [address] [phone]  Date:  02/28/2022   Name:  Heidi [name]   DOB:  Jan 06, 1950   MRN:  [id number]  PCP:  Darreld Mclean, MD    Chief Complaint: Ear Pain (Pt traveled to [place] in January, and since then she has had decreased hearing and pain in both ears. )   History of Present Illness:  Heidi [name] is a 72 y.o. very pleasant female patient who presents with the following:  Pt seen today with concern of ear pain and decreased hearing Last seen by myself in January  She notes decreased hearing in both ears but worse on the left She did travel to [place] last month and she thinks the airplane flight may have been when this started No pain in her ears It feels "like when you are taking a bath and there is water" in her ears esp the left   History of diet-controlled diabetes, hyponatremia, anemia, adrenal insufficiency, memory loss Her endocrinologist is Dr. Chalmers Cater who treats her adrenal insufficiency   Vascular surgery treats her for PAD  Tetanus update due Covid booster recommended Eye exam- pt states up to date but she does not remember when this was done.  Says it was normal at last check  A1c can be updated- will update today   Mobic Simvastatin trazodone  Lab Results  Component Value Date   HGBA1C 6.4 09/09/2021    Patient Active Problem List   Diagnosis Date Noted   Impingement syndrome of left shoulder 10/09/2019   Osteopenia 10/03/2019   Memory loss 06/23/2017   Gait abnormality 06/23/2017   Uterine prolapse 09/24/2016   Headache 09/24/2016   Hyponatremia 03/03/2015   Special screening for malignant neoplasms, colon 12/10/2012   Lymphocytosis (symptomatic)    Disorder of bone and cartilage, unspecified    Type II or unspecified type diabetes mellitus without mention of complication, uncontrolled    Reflux esophagitis     Anemia, unspecified    Other and unspecified hyperlipidemia    Lymphocytosis 01/16/2012   Idiopathic eosinophilia 04/05/2011   NONSPECIFIC ABNORMAL RESULTS LIVR FUNCTION STUDY 08/15/2008   ANEMIA, HX OF 08/15/2008    Past Medical History:  Diagnosis Date   Allergic rhinitis due to pollen    Allergy    Anemia, unspecified    Blood transfusion without reported diagnosis    1979   Disorder of bone and cartilage, unspecified    Eosinophilia    Headache 09/24/2016   Lymphocytosis (symptomatic)    Osteoporosis    Other abnormal blood chemistry    Other and unspecified hyperlipidemia    Pain in joint, shoulder region    Reflux esophagitis    Type II or unspecified type diabetes mellitus without mention of complication, uncontrolled    Type II or unspecified type diabetes mellitus without mention of complication, uncontrolled    Umbilical hernia without mention of obstruction or gangrene    Unspecified disorder of liver     Past Surgical History:  Procedure Laterality Date   CATARACT EXTRACTION, BILATERAL     CESAREAN SECTION     COLONOSCOPY  2015   Dr. Deatra Ina, normal exam    Social History   Tobacco Use   Smoking status: Never   Smokeless tobacco: Never  Substance Use Topics   Alcohol use: No   Drug use: No    Family History  Problem Relation  Age of Onset   Pancreatic cancer Neg Hx    Colon cancer Neg Hx    Esophageal cancer Neg Hx    Stomach cancer Neg Hx     Allergies  Allergen Reactions   Ciprofloxacin Anaphylaxis    Patient states she gets "CDIFF" Family states this is not correct and this is not an allergy   Alendronate Sodium     Other Reaction(s): Unknown    Medication list has been reviewed and updated.  Current Outpatient Medications on File Prior to Visit  Medication Sig Dispense Refill   calcium-vitamin D (OSCAL WITH D) 500-200 MG-UNIT per tablet Take 1 tablet by mouth daily.     CEQUA 0.09 % SOLN Apply 1 drop to eye 2 (two) times daily.      cetirizine (ZYRTEC) 10 MG chewable tablet Chew 10 mg by mouth daily.     hydrocortisone (CORTEF) 10 MG tablet Take 1 tablet (10 mg total) by mouth 2 (two) times daily. (Patient taking differently: Take 10 mg by mouth 2 (two) times daily. One tablet (10 mg) in morning and one half ('5mg'$ ) at night) 30 tablet 0   meloxicam (MOBIC) 7.5 MG tablet Take 1 tablet (7.5 mg total) by mouth daily as needed for pain. 14 tablet 0   Multiple Vitamin (MULTIVITAMIN) tablet Take 1 tablet by mouth daily.     simvastatin (ZOCOR) 20 MG tablet TAKE 1 TABLET BY MOUTH AT BEDTIME 90 tablet 0   traZODone (DESYREL) 50 MG tablet TAKE 1/2 TO 1 (ONE-HALF TO ONE) TABLET BY MOUTH AT BEDTIME FOR SLEEP 90 tablet 0   No current facility-administered medications on file prior to visit.    Review of Systems:  As per HPI- otherwise negative.   Physical Examination: Vitals:   02/28/22 0937  BP: 130/82  Pulse: 70  Resp: 18  Temp: 98.7 F (37.1 C)  SpO2: 98%   Vitals:   02/28/22 0937  Weight: 163 lb 12.8 oz (74.3 kg)  Height: '5\' 1"'$  (1.549 m)   Body mass index is 30.95 kg/m. Ideal Body Weight: Weight in (lb) to have BMI = 25: 132  GEN: no acute distress.  Obese, looks well  HEENT: Atraumatic, Normocephalic.  Ears and Nose: No external deformity. CV: RRR, No M/G/R. No JVD. No thrill. No extra heart sounds. PULM: CTA B, no wheezes, crackles, rhonchi. No retractions. No resp. distress. No accessory muscle use. ABD: S, NT, ND EXTR: No c/c/e PSYCH: Normally interactive. Conversant.  Cerumen left ear only- the right is clear and normal on exam  VC obtained.  Left ear irrigated with warm water and copious wax removed - pt felt much better and ear sx resolved TM normal after irrigation She tolerated procedure well with no complications    Assessment and Plan: Controlled type 2 diabetes mellitus without complication, without long-term current use of insulin ([place]) - Plan: HgB A1c  Impacted cerumen of left  ear  Following up on her diabetes today- A1c is pending Cerumen impaction is resolved  Signed Lamar Blinks, MD  Received her A1c as below, message to patient  Results for orders placed or performed in visit on 02/28/22  HgB A1c  Result Value Ref Range   Hgb A1c MFr Bld 6.1 4.6 - 6.5 %

## 2022-02-23 ENCOUNTER — Encounter: Payer: Self-pay | Admitting: Family Medicine

## 2022-02-28 ENCOUNTER — Ambulatory Visit (INDEPENDENT_AMBULATORY_CARE_PROVIDER_SITE_OTHER): Payer: Medicare HMO | Admitting: Family Medicine

## 2022-02-28 ENCOUNTER — Encounter: Payer: Self-pay | Admitting: Family Medicine

## 2022-02-28 VITALS — BP 130/82 | HR 70 | Temp 98.7°F | Resp 18 | Ht 61.0 in | Wt 163.8 lb

## 2022-02-28 DIAGNOSIS — E119 Type 2 diabetes mellitus without complications: Secondary | ICD-10-CM | POA: Diagnosis not present

## 2022-02-28 DIAGNOSIS — H6122 Impacted cerumen, left ear: Secondary | ICD-10-CM | POA: Diagnosis not present

## 2022-02-28 LAB — HEMOGLOBIN A1C: Hgb A1c MFr Bld: 6.1 % (ref 4.6–6.5)

## 2022-04-01 ENCOUNTER — Other Ambulatory Visit: Payer: Self-pay | Admitting: Family Medicine

## 2022-04-01 DIAGNOSIS — E782 Mixed hyperlipidemia: Secondary | ICD-10-CM

## 2022-05-16 DIAGNOSIS — Z961 Presence of intraocular lens: Secondary | ICD-10-CM | POA: Diagnosis not present

## 2022-05-16 DIAGNOSIS — H5213 Myopia, bilateral: Secondary | ICD-10-CM | POA: Diagnosis not present

## 2022-05-16 DIAGNOSIS — H524 Presbyopia: Secondary | ICD-10-CM | POA: Diagnosis not present

## 2022-05-16 DIAGNOSIS — H04123 Dry eye syndrome of bilateral lacrimal glands: Secondary | ICD-10-CM | POA: Diagnosis not present

## 2022-05-16 DIAGNOSIS — H52223 Regular astigmatism, bilateral: Secondary | ICD-10-CM | POA: Diagnosis not present

## 2022-05-16 LAB — HM DIABETES EYE EXAM

## 2022-06-03 DIAGNOSIS — E1122 Type 2 diabetes mellitus with diabetic chronic kidney disease: Secondary | ICD-10-CM | POA: Diagnosis not present

## 2022-06-03 DIAGNOSIS — E1151 Type 2 diabetes mellitus with diabetic peripheral angiopathy without gangrene: Secondary | ICD-10-CM | POA: Diagnosis not present

## 2022-06-03 DIAGNOSIS — N182 Chronic kidney disease, stage 2 (mild): Secondary | ICD-10-CM | POA: Diagnosis not present

## 2022-06-03 DIAGNOSIS — D84821 Immunodeficiency due to drugs: Secondary | ICD-10-CM | POA: Diagnosis not present

## 2022-06-03 DIAGNOSIS — M810 Age-related osteoporosis without current pathological fracture: Secondary | ICD-10-CM | POA: Diagnosis not present

## 2022-06-03 DIAGNOSIS — H04129 Dry eye syndrome of unspecified lacrimal gland: Secondary | ICD-10-CM | POA: Diagnosis not present

## 2022-06-03 DIAGNOSIS — M1990 Unspecified osteoarthritis, unspecified site: Secondary | ICD-10-CM | POA: Diagnosis not present

## 2022-06-03 DIAGNOSIS — E785 Hyperlipidemia, unspecified: Secondary | ICD-10-CM | POA: Diagnosis not present

## 2022-06-03 DIAGNOSIS — J309 Allergic rhinitis, unspecified: Secondary | ICD-10-CM | POA: Diagnosis not present

## 2022-06-03 DIAGNOSIS — E669 Obesity, unspecified: Secondary | ICD-10-CM | POA: Diagnosis not present

## 2022-06-03 DIAGNOSIS — F325 Major depressive disorder, single episode, in full remission: Secondary | ICD-10-CM | POA: Diagnosis not present

## 2022-06-03 DIAGNOSIS — Z008 Encounter for other general examination: Secondary | ICD-10-CM | POA: Diagnosis not present

## 2022-06-03 DIAGNOSIS — K5900 Constipation, unspecified: Secondary | ICD-10-CM | POA: Diagnosis not present

## 2022-06-29 ENCOUNTER — Other Ambulatory Visit: Payer: Self-pay | Admitting: Family Medicine

## 2022-06-29 DIAGNOSIS — E782 Mixed hyperlipidemia: Secondary | ICD-10-CM

## 2022-07-09 ENCOUNTER — Encounter: Payer: Self-pay | Admitting: Family Medicine

## 2022-08-02 NOTE — Progress Notes (Signed)
[facility] [address], [address] [place], [place] [address] [phone]  Date:  08/08/2022   Name:  Heidi [name]   DOB:  01/24/1950   MRN:  [id number]  PCP:  Pearline Cables, MD    Chief Complaint: Follow-up (Concerns/ questions: none/Tdap due- Medicare pt/Foot exam is due/Eye exam: [facility] [address]/[facility]V due)   History of Present Illness:  Heidi [name] is a 72 y.o. very pleasant female patient who presents with the following:  Patient seen today for a follow-up visit Most recent visit with myself was in February History of diet-controlled diabetes, hyponatremia, anemia, adrenal insufficiency, memory loss, osteoporosis Her endocrinologist is Dr. Talmage Nap who treats her adrenal insufficiency   Her A1c in February looked good at 6.1%  Tetanus booster-recommend at pharmacy, she plans to get this done  Eye exam- done per [facility] at [address] this year - report requested  Due for foot exam Recommend COVID booster and flu shot this fall  Reviewed note from Dr Talmage Nap dated 10/21/21: For adrenal insufficiency, empty sella syndrome they are monitoring her thyroid and vitamin D, she is taking hydrocortisone 10 mg daily For osteoporosis she is taking calcium and vitamin D, they are doing Reclast infusions Patient Active Problem List   Diagnosis Date Noted   Impingement syndrome of left shoulder 10/09/2019   Osteopenia 10/03/2019   Memory loss 06/23/2017   Gait abnormality 06/23/2017   Uterine prolapse 09/24/2016   Headache 09/24/2016   Hyponatremia 03/03/2015   Special screening for malignant neoplasms, colon 12/10/2012   Lymphocytosis (symptomatic)    Disorder of bone and cartilage, unspecified    Type II or unspecified type diabetes mellitus without mention of complication, uncontrolled    Reflux esophagitis    Anemia, unspecified    Other and unspecified hyperlipidemia    Lymphocytosis 01/16/2012    Idiopathic eosinophilia 04/05/2011   NONSPECIFIC ABNORMAL RESULTS LIVR FUNCTION STUDY 08/15/2008   ANEMIA, HX OF 08/15/2008    Past Medical History:  Diagnosis Date   Allergic rhinitis due to pollen    Allergy    Anemia, unspecified    Blood transfusion without reported diagnosis    1979   Disorder of bone and cartilage, unspecified    Eosinophilia    Headache 09/24/2016   Lymphocytosis (symptomatic)    Osteoporosis    Other abnormal blood chemistry    Other and unspecified hyperlipidemia    Pain in joint, shoulder region    Reflux esophagitis    Type II or unspecified type diabetes mellitus without mention of complication, uncontrolled    Type II or unspecified type diabetes mellitus without mention of complication, uncontrolled    Umbilical hernia without mention of obstruction or gangrene    Unspecified disorder of liver     Past Surgical History:  Procedure Laterality Date   CATARACT EXTRACTION, BILATERAL     CESAREAN SECTION     COLONOSCOPY  2015   Dr. Arlyce Dice, normal exam    Social History   Tobacco Use   Smoking status: Never   Smokeless tobacco: Never  Substance Use Topics   Alcohol use: No   Drug use: No    Family History  Problem Relation Age of Onset   Pancreatic cancer Neg Hx    Colon cancer Neg Hx    Esophageal cancer Neg Hx    Stomach cancer Neg Hx     Allergies  Allergen Reactions   Ciprofloxacin Anaphylaxis  Patient states she gets "CDIFF" Family states this is not correct and this is not an allergy   Alendronate Sodium     Other Reaction(s): Unknown    Medication list has been reviewed and updated.  Current Outpatient Medications on File Prior to Visit  Medication Sig Dispense Refill   calcium-vitamin D (OSCAL WITH D) 500-200 MG-UNIT per tablet Take 1 tablet by mouth daily.     CEQUA 0.09 % SOLN Apply 1 drop to eye 2 (two) times daily.     cetirizine (ZYRTEC) 10 MG chewable tablet Chew 10 mg by mouth daily.     hydrocortisone  (CORTEF) 10 MG tablet Take 1 tablet (10 mg total) by mouth 2 (two) times daily. (Patient taking differently: Take 10 mg by mouth 2 (two) times daily. One tablet (10 mg) in morning and one half (5mg ) at night) 30 tablet 0   meloxicam (MOBIC) 7.5 MG tablet Take 1 tablet (7.5 mg total) by mouth daily as needed for pain. 14 tablet 0   Multiple Vitamin (MULTIVITAMIN) tablet Take 1 tablet by mouth daily.     simvastatin (ZOCOR) 20 MG tablet Take 1 tablet (20 mg total) by mouth at bedtime. 90 tablet 0   traZODone (DESYREL) 50 MG tablet TAKE 1/2 TO 1 (ONE-HALF TO ONE) TABLET BY MOUTH AT BEDTIME FOR SLEEP 90 tablet 0   No current facility-administered medications on file prior to visit.    Review of Systems:  As per HPI- otherwise negative.   Physical Examination: Vitals:   08/08/22 0940  BP: 110/60  Pulse: 85  Resp: 18  Temp: (!) 79.6 F (26.4 C)  SpO2: 98%   Vitals:   08/08/22 0940  Weight: 164 lb 6.4 oz (74.6 kg)  Height: 5\' 1"  (1.549 m)   Body mass index is 31.06 kg/m. Ideal Body Weight: Weight in (lb) to have BMI = 25: 132  GEN: no acute distress. Mild obesity, looks well  HEENT: Atraumatic, Normocephalic.  Bilateral TM wnl, oropharynx normal.  PEERL,EOMI.   Ears and Nose: No external deformity. CV: RRR, No M/G/R. No JVD. No thrill. No extra heart sounds. PULM: CTA B, no wheezes, crackles, rhonchi. No retractions. No resp. distress. No accessory muscle use. ABD: S, NT, ND, +BS. No rebound. No HSM. EXTR: No c/c/e PSYCH: Normally interactive. Conversant.    Assessment and Plan: Controlled type 2 diabetes mellitus without complication, without long-term current use of insulin (HCC) - Plan: Hemoglobin A1c, Microalbumin / creatinine urine ratio  Hyponatremia - Plan: Basic metabolic panel  Anemia, unspecified type - Plan: CBC  Mixed hyperlipidemia - Plan: Lipid panel, simvastatin (ZOCOR) 20 MG tablet  Estrogen deficiency - Plan: DG Bone Density  Patient seen today for  follow-up of diabetes.  A1c, urine microalbumin pending History of hyponatremia, check BMP Follow-up on lipids, refilled her simvastatin Ordered bone density for history of osteoporosis, treated with Reclast infusions Recommend Tdap at pharmacy, COVID and flu boosters this fall Will plan further follow- up pending labs.   Signed Abbe Amsterdam, MD  Received labs as above, message to patient  Results for orders placed or performed in visit on 08/08/22  Hemoglobin A1c  Result Value Ref Range   Hgb A1c MFr Bld 6.2 4.6 - 6.5 %  Microalbumin / creatinine urine ratio  Result Value Ref Range   Microalb, Ur <0.7 0.0 - 1.9 mg/dL   Creatinine,U 161.0 mg/dL   Microalb Creat Ratio 0.5 0.0 - 30.0 mg/g  Basic metabolic panel  Result Value Ref  Range   Sodium 141 135 - 145 mEq/L   Potassium 3.7 3.5 - 5.1 mEq/L   Chloride 104 96 - 112 mEq/L   CO2 27 19 - 32 mEq/L   Glucose, Bld 81 70 - 99 mg/dL   BUN 18 6 - 23 mg/dL   Creatinine, Ser 1.61 0.40 - 1.20 mg/dL   GFR 09.60 (L) >45.40 mL/min   Calcium 9.4 8.4 - 10.5 mg/dL  Lipid panel  Result Value Ref Range   Cholesterol 164 0 - 200 mg/dL   Triglycerides 98.1 0.0 - 149.0 mg/dL   HDL 19.14 >78.29 mg/dL   VLDL 56.2 0.0 - 13.0 mg/dL   LDL Cholesterol 99 0 - 99 mg/dL   Total CHOL/HDL Ratio 3    NonHDL 114.13   CBC  Result Value Ref Range   WBC 7.1 4.0 - 10.5 K/uL   RBC 4.59 3.87 - 5.11 Mil/uL   Platelets 263.0 150.0 - 400.0 K/uL   Hemoglobin 12.8 12.0 - 15.0 g/dL   HCT 86.5 78.4 - 69.6 %   MCV 85.4 78.0 - 100.0 fl   MCHC 32.5 30.0 - 36.0 g/dL   RDW 29.5 28.4 - 13.2 %

## 2022-08-08 ENCOUNTER — Encounter: Payer: Self-pay | Admitting: Family Medicine

## 2022-08-08 ENCOUNTER — Other Ambulatory Visit (HOSPITAL_BASED_OUTPATIENT_CLINIC_OR_DEPARTMENT_OTHER): Payer: Self-pay

## 2022-08-08 ENCOUNTER — Ambulatory Visit (INDEPENDENT_AMBULATORY_CARE_PROVIDER_SITE_OTHER): Payer: Medicare HMO | Admitting: Family Medicine

## 2022-08-08 VITALS — BP 110/60 | HR 85 | Temp 97.6°F | Resp 18 | Ht 61.0 in | Wt 164.4 lb

## 2022-08-08 DIAGNOSIS — E119 Type 2 diabetes mellitus without complications: Secondary | ICD-10-CM | POA: Diagnosis not present

## 2022-08-08 DIAGNOSIS — E2839 Other primary ovarian failure: Secondary | ICD-10-CM

## 2022-08-08 DIAGNOSIS — E871 Hypo-osmolality and hyponatremia: Secondary | ICD-10-CM

## 2022-08-08 DIAGNOSIS — D649 Anemia, unspecified: Secondary | ICD-10-CM | POA: Diagnosis not present

## 2022-08-08 DIAGNOSIS — E782 Mixed hyperlipidemia: Secondary | ICD-10-CM

## 2022-08-08 LAB — BASIC METABOLIC PANEL
BUN: 18 mg/dL (ref 6–23)
CO2: 27 mEq/L (ref 19–32)
Calcium: 9.4 mg/dL (ref 8.4–10.5)
Chloride: 104 mEq/L (ref 96–112)
Creatinine, Ser: 1.1 mg/dL (ref 0.40–1.20)
GFR: 50.61 mL/min — ABNORMAL LOW (ref >60.00)
Glucose, Bld: 81 mg/dL (ref 70–99)
Potassium: 3.7 mEq/L (ref 3.5–5.1)
Sodium: 141 mEq/L (ref 135–145)

## 2022-08-08 LAB — CBC
HCT: 39.2 % (ref 36.0–46.0)
Hemoglobin: 12.8 g/dL (ref 12.0–15.0)
MCHC: 32.5 g/dL (ref 30.0–36.0)
MCV: 85.4 fl (ref 78.0–100.0)
Platelets: 263 10*3/uL (ref 150.0–400.0)
RBC: 4.59 Mil/uL (ref 3.87–5.11)
RDW: 13.2 % (ref 11.5–15.5)
WBC: 7.1 10*3/uL (ref 4.0–10.5)

## 2022-08-08 LAB — LIPID PANEL
Cholesterol: 164 mg/dL (ref 0–200)
HDL: 49.8 mg/dL (ref >39.00)
LDL Cholesterol: 99 mg/dL (ref 0–99)
NonHDL: 114.13
Total CHOL/HDL Ratio: 3
Triglycerides: 74 mg/dL (ref 0.0–149.0)
VLDL: 14.8 mg/dL (ref 0.0–40.0)

## 2022-08-08 LAB — HEMOGLOBIN A1C: Hgb A1c MFr Bld: 6.2 % (ref 4.6–6.5)

## 2022-08-08 LAB — MICROALBUMIN / CREATININE URINE RATIO
Creatinine,U: 145.4 mg/dL
Microalb Creat Ratio: 0.5 mg/g (ref 0.0–30.0)
Microalb, Ur: <0.7 mg/dL (ref 0.0–1.9)

## 2022-08-08 MED ORDER — SIMVASTATIN 20 MG PO TABS: 20.0000 mg | ORAL_TABLET | Freq: Every day | ORAL | 0 refills | Status: DC

## 2022-08-08 MED ORDER — BOOSTRIX 5-2.5-18.5 LF-MCG/0.5 IM SUSY
0.5000 mL | PREFILLED_SYRINGE | Freq: Once | INTRAMUSCULAR | 0 refills | Status: AC
  Filled 2022-08-08: qty 0.5, 1d supply, fill #0

## 2022-08-08 NOTE — Patient Instructions (Addendum)
It was great to see you again today, I will be in touch with your lab work as soon as possible  Will request a copy of your eye exam from [facility] I believe you are due for a tetanus booster, please have this done at your pharmacy at your convenience  I also recommend getting a flu and COVID booster this fall  Assuming all is well, please see me in about 6 months

## 2022-08-11 ENCOUNTER — Ambulatory Visit (HOSPITAL_BASED_OUTPATIENT_CLINIC_OR_DEPARTMENT_OTHER): Payer: Medicare HMO

## 2022-08-16 DIAGNOSIS — Z1231 Encounter for screening mammogram for malignant neoplasm of breast: Secondary | ICD-10-CM | POA: Diagnosis not present

## 2022-08-16 LAB — HM MAMMOGRAPHY

## 2022-08-19 ENCOUNTER — Encounter: Payer: Self-pay | Admitting: Medical

## 2022-08-19 ENCOUNTER — Ambulatory Visit: Payer: Medicare HMO | Admitting: Medical

## 2022-08-19 VITALS — BP 127/69 | HR 72 | Ht 61.0 in | Wt 164.4 lb

## 2022-08-19 DIAGNOSIS — E871 Hypo-osmolality and hyponatremia: Secondary | ICD-10-CM

## 2022-08-19 DIAGNOSIS — R413 Other amnesia: Secondary | ICD-10-CM

## 2022-08-19 DIAGNOSIS — R269 Unspecified abnormalities of gait and mobility: Secondary | ICD-10-CM

## 2022-08-19 NOTE — Patient Instructions (Signed)
Mobility Issues Progressive worsening of mobility and balance, with difficulty in lifting legs and climbing steps. No recent falls reported. History of physical therapy for similar issues a few years ago. -Refer to physical therapy for gait training and assessment for assistive devices.  Adrenal Insufficiency Stable on current treatment. Recent weight gain due to steroid use may be contributing to mobility issues. -Continue current management. -pt declined cmp since na checked recently and stable.  Short Term Memory Issues History of short term memory problems, previously evaluated by neurology with unclear diagnosis. No current medications for memory issues. -Refer back to [facility] for re-evaluation of memory issues.  Osteoporosis Risk of falls due to mobility issues. -Continue current management to prevent falls.  General Health Maintenance -cmp declined but if change in mental status or fatigue then get cmp   Follow up with pcp as regular rescheduled or sooner if needed.

## 2022-08-19 NOTE — Progress Notes (Addendum)
Subjective:    Patient ID: Heidi [name], female    DOB: 07-25-1950, 72 y.o.   MRN: [id number]  HPI  Discussed the use of AI scribe software for clinical note transcription with the patient, who gave verbal consent to proceed.  History of Present Illness   The patient, with a history of adrenal insufficiency, dilated ventricles, and osteoporosis, has been experiencing progressive mobility issues over the past few months(but overall since 2019). The mobility issues were first noticed during a workup for hyponatremia, which was subsequently diagnosed as adrenal insufficiency. The patient's mobility has been progressively worsening, with the most recent noticeable decline since May. The patient has difficulty with tasks such as climbing steps and getting in and out of cars, requiring assistance for these activities.  The patient's mobility issues are further complicated by weight gain, likely due to steroid treatment for adrenal insufficiency(no pedal edema). The patient's adrenal insufficiency is currently stable, and she is under the care of an endocrinologist, with regular check-ups every six months. The patient's sodium levels have been stable, and there have been no recent falls.  The patient has also been experiencing short-term memory issues, which have been ongoing. She had previously seen a neurologist for these issues around the time of the adrenal insufficiency diagnosis. The patient had been started on medication for potential dementia, but this was stopped after three months. The patient does not drive and is retired, with most transportation needs met by family members.  The patient has previously undergone physical therapy for gait training around the time of the adrenal insufficiency diagnosis, but this has not been continued. The patient does not currently use a walker. The patient denies any recent headaches, nausea, vomiting, or dizziness. No falls reported.      MMSE score today  21.  Review of Systems See hpi.    Objective:   Physical Exam  General Mental Status- Alert. General Appearance- Not in acute distress.   Skin General: Color- Normal Color. Moisture- Normal Moisture.  Neck Carotid Arteries- Normal color. Moisture- Normal Moisture. No carotid bruits. No JVD.  Chest and Lung Exam Auscultation: Breath Sounds:-Normal.  Cardiovascular Auscultation:Rythm- Regular. Murmurs & Other Heart Sounds:Auscultation of the heart reveals- No Murmurs.  Abdomen Inspection:-Inspeection Normal. Palpation/Percussion:Note:No mass. Palpation and Percussion of the abdomen reveal- Non Tender, Non Distended + BS, no rebound or guarding.   Neurologic Cranial Nerve exam:- CN III-XII intact(No nystagmus), symmetric smile. Strength:- 5/5 equal and symmetric strength both upper and lower extremities.  Poor gait on review.      Assessment & Plan:  Assessment and Plan    Mobility Issues Progressive worsening of mobility and balance, with difficulty in lifting legs and climbing steps. No recent falls reported. History of physical therapy for similar issues a few years ago. -Refer to physical therapy for gait training and assessment for assistive devices.  Adrenal Insufficiency Stable on current treatment. Recent weight gain due to steroid use may be contributing to mobility issues. -Continue current management. -pt declined cmp since na checked recently and stable.  Short Term Memory Issues History of short term memory problems, previously evaluated by neurology with unclear diagnosis. No current medications for memory issues. -Refer back to [facility] for re-evaluation of memory issues.  Osteoporosis Risk of falls due to mobility issues. -Continue current management to prevent falls.  General Health Maintenance -cmp declined but if change in mental status or fatigue then get cmp   Follow up with pcp as regular rescheduled or sooner if  needed.        Esperanza Richters, PA-C

## 2022-08-23 ENCOUNTER — Encounter: Payer: Self-pay | Admitting: Family Medicine

## 2022-08-23 ENCOUNTER — Ambulatory Visit (HOSPITAL_BASED_OUTPATIENT_CLINIC_OR_DEPARTMENT_OTHER)
Admission: RE | Admit: 2022-08-23 | Discharge: 2022-08-23 | Disposition: A | Discharge status: Home / Self Care | Payer: Medicare HMO | Source: Ambulatory Visit | Attending: Family Medicine | Admitting: Family Medicine

## 2022-08-23 DIAGNOSIS — E2839 Other primary ovarian failure: Secondary | ICD-10-CM | POA: Insufficient documentation

## 2022-08-23 DIAGNOSIS — M85852 Other specified disorders of bone density and structure, left thigh: Secondary | ICD-10-CM | POA: Diagnosis not present

## 2022-08-23 DIAGNOSIS — Z780 Asymptomatic menopausal state: Secondary | ICD-10-CM | POA: Diagnosis not present

## 2022-09-19 ENCOUNTER — Other Ambulatory Visit: Payer: Self-pay

## 2022-09-19 ENCOUNTER — Ambulatory Visit (HOSPITAL_BASED_OUTPATIENT_CLINIC_OR_DEPARTMENT_OTHER): Payer: Medicare HMO | Attending: Medical | Admitting: Physical Therapy

## 2022-09-19 ENCOUNTER — Encounter (HOSPITAL_BASED_OUTPATIENT_CLINIC_OR_DEPARTMENT_OTHER): Payer: Self-pay | Admitting: Physical Therapy

## 2022-09-19 DIAGNOSIS — R269 Unspecified abnormalities of gait and mobility: Secondary | ICD-10-CM | POA: Diagnosis not present

## 2022-09-19 DIAGNOSIS — M6281 Muscle weakness (generalized): Secondary | ICD-10-CM | POA: Insufficient documentation

## 2022-09-19 DIAGNOSIS — R2689 Other abnormalities of gait and mobility: Secondary | ICD-10-CM

## 2022-09-19 NOTE — Therapy (Unsigned)
OUTPATIENT PHYSICAL THERAPY EVALUATION   Patient Name: Heidi [name] MRN: [id number] DOB:05-Jan-1950, 72 y.o., female Today's Date: 09/19/2022  END OF SESSION:  PT End of Session - 09/19/22 1526     Visit Number 1    Number of Visits 11    Date for PT Re-Evaluation 12/03/22    Authorization Type [employer] MCR    Progress Note Due on Visit 10    PT Start Time 1445    PT Stop Time 1530    PT Time Calculation (min) 45 min    Activity Tolerance Patient tolerated treatment well    Behavior During Therapy WFL for tasks assessed/performed             Past Medical History:  Diagnosis Date   Allergic rhinitis due to pollen    Allergy    Anemia, unspecified    Blood transfusion without reported diagnosis    1979   Disorder of bone and cartilage, unspecified    Eosinophilia    Headache 09/24/2016   Lymphocytosis (symptomatic)    Osteoporosis    Other abnormal blood chemistry    Other and unspecified hyperlipidemia    Pain in joint, shoulder region    Reflux esophagitis    Type II or unspecified type diabetes mellitus without mention of complication, uncontrolled    Type II or unspecified type diabetes mellitus without mention of complication, uncontrolled    Umbilical hernia without mention of obstruction or gangrene    Unspecified disorder of liver    Past Surgical History:  Procedure Laterality Date   CATARACT EXTRACTION, BILATERAL     CESAREAN SECTION     COLONOSCOPY  2015   Dr. Arlyce Dice, normal exam   Patient Active Problem List   Diagnosis Date Noted   Impingement syndrome of left shoulder 10/09/2019   Osteopenia 10/03/2019   Memory loss 06/23/2017   Gait abnormality 06/23/2017   Uterine prolapse 09/24/2016   Headache 09/24/2016   Hyponatremia 03/03/2015   Special screening for malignant neoplasms, colon 12/10/2012   Lymphocytosis (symptomatic)    Disorder of bone and cartilage, unspecified    Type II or unspecified type diabetes mellitus without mention of  complication, uncontrolled    Reflux esophagitis    Anemia, unspecified    Other and unspecified hyperlipidemia    Lymphocytosis 01/16/2012   Idiopathic eosinophilia 04/05/2011   NONSPECIFIC ABNORMAL RESULTS LIVR FUNCTION STUDY 08/15/2008   ANEMIA, HX OF 08/15/2008     REFERRING PROVIDER:  Esperanza Richters, PA-C    REFERRING DIAG:  R26.9 (ICD-10-CM) - Gait disturbance    Rationale for Evaluation and Treatment: Rehabilitation  THERAPY DIAG:  Other abnormalities of gait and mobility  Muscle weakness (generalized)  ONSET DATE: a long time   SUBJECTIVE:  SUBJECTIVE STATEMENT: Has had PT before, a few years ago, that went really well. Did not really have any problems until now. I am doing my exercies, I think it's my age.  Pt denies pain except shoulder bursitis when it is cold.  If I am walking, it feels like somebody is pushing me.  Normally walk in the house but not much bc I am scared of falling.  Denies dizziness.  Denies use of AD but used a walker at one time 8-10 years ago.   PERTINENT HISTORY:  See PMH  PAIN:  Are you having pain? No  PRECAUTIONS:  Fall  RED FLAGS: None   WEIGHT BEARING RESTRICTIONS:  No  FALLS:  Has patient fallen in last 6 months? No  LIVING ENVIRONMENT: Has stairs in home, lives with husband and daughter and her children  OCCUPATION:  retired  PLOF:  Independent  PATIENT GOALS:  Improve gait, improve balance, exercises  OBJECTIVE:   PATIENT SURVEYS:  ABC scale eval 9/16  1-30 9-40  2-30 10-40  3-20 11-30  4-30 1230  5-20 13-30  6-10 14-30  7-30 15-20  8-40 16-0    CO30GNITIVE STATUS: Within functional limits for tasks assessed   SENSATI30ON: WFL  POSTURE:  Eval: flexed at waist in standing- moreso on right with apparent  thoracic levoscoliosis and Lt scapular winging   GAIT: Level of assistance: Complete Independence Comments: eval: flat foot strike, flexed posture, short step length   Body Part #1   EVAL: gross LE strength bilat 4-/5  Bil ankle DF to neutral      TREATMENT:                                                                                                                               DATE: eval 9/16  Discussed trekking poles SLR SL hip abd Bridge ball bw knees Mod thomas Piriformis stretch Seated hamstring stretch Standing gastroc stretch   PATIENT EDUCATION:  Education details: Teacher, music of condition, POC, HEP, exercise form/rationale Person educated: Patient Education method: Explanation, Demonstration, Tactile cues, Verbal cues, and Handouts Education comprehension: verbalized understanding, returned demonstration, verbal cues required, tactile cues required, and needs further education  HOME EXERCISE PROGRAM: [id number]   ASSESSMENT:  CLINICAL IMPRESSION: Patient is a 72 y.o. F who was seen today for physical therapy evaluation and treatment for gait disturbance. Pt with gross weakness and limited flexibility leading to poor functional tolerance. I do not think a RW is appropriate at this time as it would further encourage flexed posture but we did discuss use of trekking poles for safety in gait.     OBJECTIVE IMPAIRMENTS: Abnormal gait, decreased activity tolerance, decreased balance, difficulty walking, decreased ROM, decreased strength, increased muscle spasms, impaired flexibility, improper body mechanics, postural dysfunction, and pain.   ACTIVITY LIMITATIONS: standing, squatting, stairs, dressing, and locomotion level  PARTICIPATION LIMITATIONS: meal prep, cleaning, laundry, shopping, and community activity  PERSONAL FACTORS:  see pmh  are also affecting patient's functional outcome.   REHAB POTENTIAL: Good  CLINICAL DECISION MAKING:  Unstable/unpredictable  EVALUATION COMPLEXITY: Moderate   GOALS: Goals reviewed with patient? Yes  SHORT TERM GOALS: Target date: 09/08/22  Pt will obtain LRAD for safety Baseline: Goal status: INITIAL    LONG TERM GOALS: Target date: POC date  Pt will demo gross 5/5 LE strength Baseline:  Goal status: INITIAL  2.  Ambulate with upright posture Baseline:  Goal status: INITIAL  3.  Demo increased speed of gait in hallway wihtout LOB Baseline:  Goal status: INITIAL  4.  Navigate around and over obstacles on the floor with good balance control Baseline:  Goal status: INITIAL     PLAN:  PT FREQUENCY: 1x/week  PT DURATION: 10 weeks  PLANNED INTERVENTIONS: Therapeutic exercises, Therapeutic activity, Neuromuscular re-education, Balance training, Gait training, Patient/Family education, Self Care, Joint mobilization, Stair training, Aquatic Therapy, Dry Needling, Electrical stimulation, Spinal mobilization, Cryotherapy, Moist heat, Taping, Ionotophoresis 4mg /ml Dexamethasone, Manual therapy, and Re-evaluation.  PLAN FOR NEXT SESSION: did she get trekking poles? Gross hip flexibility & strength- begin CKC balance   Keelia Graybill C. Dejanira Pamintuan PT, DPT 09/20/22 3:57 PM

## 2022-10-05 ENCOUNTER — Ambulatory Visit (HOSPITAL_BASED_OUTPATIENT_CLINIC_OR_DEPARTMENT_OTHER): Payer: Medicare HMO | Attending: Medical

## 2022-10-05 ENCOUNTER — Encounter (HOSPITAL_BASED_OUTPATIENT_CLINIC_OR_DEPARTMENT_OTHER): Payer: Self-pay

## 2022-10-05 DIAGNOSIS — M6281 Muscle weakness (generalized): Secondary | ICD-10-CM | POA: Diagnosis not present

## 2022-10-05 DIAGNOSIS — R2689 Other abnormalities of gait and mobility: Secondary | ICD-10-CM | POA: Diagnosis not present

## 2022-10-05 NOTE — Therapy (Signed)
OUTPATIENT PHYSICAL THERAPY EVALUATION   Patient Name: Heidi [name] MRN: [id number] DOB:[birth year], 72 y.o., female Today's Date: 10/05/2022  END OF SESSION:  PT End of Session - 10/05/22 1238     Visit Number 2    Number of Visits 11    Date for PT Re-Evaluation 12/03/22    Authorization Type [employer] MCR    Progress Note Due on Visit 10    PT Start Time 1237    PT Stop Time 1315    PT Time Calculation (min) 38 min    Equipment Utilized During Treatment Gait belt    Activity Tolerance Patient tolerated treatment well    Behavior During Therapy WFL for tasks assessed/performed              Past Medical History:  Diagnosis Date   Allergic rhinitis due to pollen    Allergy    Anemia, unspecified    Blood transfusion without reported diagnosis    1979   Disorder of bone and cartilage, unspecified    Eosinophilia    Headache 09/24/2016   Lymphocytosis (symptomatic)    Osteoporosis    Other abnormal blood chemistry    Other and unspecified hyperlipidemia    Pain in joint, shoulder region    Reflux esophagitis    Type II or unspecified type diabetes mellitus without mention of complication, uncontrolled    Type II or unspecified type diabetes mellitus without mention of complication, uncontrolled    Umbilical hernia without mention of obstruction or gangrene    Unspecified disorder of liver    Past Surgical History:  Procedure Laterality Date   CATARACT EXTRACTION, BILATERAL     CESAREAN SECTION     COLONOSCOPY  2015   Dr. Arlyce Dice, normal exam   Patient Active Problem List   Diagnosis Date Noted   Impingement syndrome of left shoulder 10/09/2019   Osteopenia 10/03/2019   Memory loss 06/23/2017   Gait abnormality 06/23/2017   Uterine prolapse 09/24/2016   Headache 09/24/2016   Hyponatremia 03/03/2015   Special screening for malignant neoplasms, colon 12/10/2012   Lymphocytosis (symptomatic)    Disorder of bone and cartilage    Type II or unspecified type  diabetes mellitus without mention of complication, uncontrolled    Reflux esophagitis    Anemia, unspecified    Other and unspecified hyperlipidemia    Lymphocytosis 01/16/2012   Idiopathic eosinophilia 04/05/2011   NONSPECIFIC ABNORMAL RESULTS LIVR FUNCTION STUDY 08/15/2008   ANEMIA, HX OF 08/15/2008     REFERRING PROVIDER:  Esperanza Richters, PA-C    REFERRING DIAG:  R26.9 (ICD-10-CM) - Gait disturbance    Rationale for Evaluation and Treatment: Rehabilitation  THERAPY DIAG:  Other abnormalities of gait and mobility  Muscle weakness (generalized)  ONSET DATE: a long time   SUBJECTIVE:  SUBJECTIVE STATEMENT: Has had PT before, a few years ago, that went really well. Did not really have any problems until now. I am doing my exercies, I think it's my age.  Pt denies pain except shoulder bursitis when it is cold.  If I am walking, it feels like somebody is pushing me.  Normally walk in the house but not much bc I am scared of falling.  Denies dizziness.  Denies use of AD but used a walker at one time 8-10 years ago.   PERTINENT HISTORY:  See PMH  PAIN:  Are you having pain? No  PRECAUTIONS:  Fall  RED FLAGS: None   WEIGHT BEARING RESTRICTIONS:  No  FALLS:  Has patient fallen in last 6 months? No  LIVING ENVIRONMENT: Has stairs in home, lives with husband and daughter and her children  OCCUPATION:  retired  PLOF:  Independent  PATIENT GOALS:  Improve gait, improve balance, exercises  OBJECTIVE:   PATIENT SURVEYS:  ABC scale eval 9/16  1-30 9-40  2-30 10-40  3-20 11-30  4-30 1230  5-20 13-30  6-10 14-30  7-30 15-20  8-40 16-0    CO30GNITIVE STATUS: Within functional limits for tasks assessed   SENSATI30ON: WFL  POSTURE:  Eval: flexed at waist in  standing- moreso on right with apparent thoracic levoscoliosis and Lt scapular winging   GAIT: Level of assistance: Complete Independence Comments: eval: flat foot strike, flexed posture, short step length   Body Part #1   EVAL: gross LE strength bilat 4-/5  Bil ankle DF to neutral      TREATMENT:                                                                                                                               10/2: Nu-step L4 HR/TR 2x10 Standing marches x10 Partial squats x10 cues for proper form.  Side stepping CGA 1/2 hall each Backwards walking 28ft CGA (Cues for increased step length) Walking marches (cues to remind her to bring knees up) Tandem walking with rail support x2 laps Romberg stance 30sec x2 Hurdles fwd and lateral (step to and reciprocal) Alternating step taps 6" 2x10ea   DATE: eval 9/16  Discussed trekking poles SLR SL hip abd Bridge ball bw knees Mod thomas Piriformis stretch Seated hamstring stretch Standing gastroc stretch   PATIENT EDUCATION:  Education details: Teacher, music of condition, POC, HEP, exercise form/rationale Person educated: Patient Education method: Explanation, Demonstration, Tactile cues, Verbal cues, and Handouts Education comprehension: verbalized understanding, returned demonstration, verbal cues required, tactile cues required, and needs further education  HOME EXERCISE PROGRAM: KYYRVLMN   ASSESSMENT:  CLINICAL IMPRESSION: Pt demonstrates trendelenburg with alternating step taps. Greater difficulty with R foot clearance of hurdles compared to L, though no major LOB. CGA and SBA utilized with gait belt donned for balance challenges. Unable to perform correct technique with squats despite extensive cues, but had good performance of sit to stands. She  does demonstrate lordotic posture which worsens with sit to stands. This is likely contributing to her balance issue with ambulation. Will continue to work on  improving gait quality/stability as well as LE strength to prevent falls.    OBJECTIVE IMPAIRMENTS: Abnormal gait, decreased activity tolerance, decreased balance, difficulty walking, decreased ROM, decreased strength, increased muscle spasms, impaired flexibility, improper body mechanics, postural dysfunction, and pain.   ACTIVITY LIMITATIONS: standing, squatting, stairs, dressing, and locomotion level  PARTICIPATION LIMITATIONS: meal prep, cleaning, laundry, shopping, and community activity  PERSONAL FACTORS:  see pmh  are also affecting patient's functional outcome.   REHAB POTENTIAL: Good  CLINICAL DECISION MAKING: Unstable/unpredictable  EVALUATION COMPLEXITY: Moderate   GOALS: Goals reviewed with patient? Yes  SHORT TERM GOALS: Target date: 09/08/22  Pt will obtain LRAD for safety Baseline: Goal status: INITIAL    LONG TERM GOALS: Target date: POC date  Pt will demo gross 5/5 LE strength Baseline:  Goal status: INITIAL  2.  Ambulate with upright posture Baseline:  Goal status: INITIAL  3.  Demo increased speed of gait in hallway wihtout LOB Baseline:  Goal status: INITIAL  4.  Navigate around and over obstacles on the floor with good balance control Baseline:  Goal status: INITIAL     PLAN:  PT FREQUENCY: 1x/week  PT DURATION: 10 weeks  PLANNED INTERVENTIONS: Therapeutic exercises, Therapeutic activity, Neuromuscular re-education, Balance training, Gait training, Patient/Family education, Self Care, Joint mobilization, Stair training, Aquatic Therapy, Dry Needling, Electrical stimulation, Spinal mobilization, Cryotherapy, Moist heat, Taping, Ionotophoresis 4mg /ml Dexamethasone, Manual therapy, and Re-evaluation.  PLAN FOR NEXT SESSION: did she get trekking poles? Gross hip flexibility & strength- begin CKC balance   Riki Altes, PTA  10/05/22 1:26 PM

## 2022-10-12 ENCOUNTER — Ambulatory Visit (HOSPITAL_BASED_OUTPATIENT_CLINIC_OR_DEPARTMENT_OTHER): Payer: Medicare HMO

## 2022-10-12 ENCOUNTER — Encounter (HOSPITAL_BASED_OUTPATIENT_CLINIC_OR_DEPARTMENT_OTHER): Payer: Self-pay

## 2022-10-12 DIAGNOSIS — R2689 Other abnormalities of gait and mobility: Secondary | ICD-10-CM | POA: Diagnosis not present

## 2022-10-12 DIAGNOSIS — M6281 Muscle weakness (generalized): Secondary | ICD-10-CM

## 2022-10-12 NOTE — Therapy (Signed)
OUTPATIENT PHYSICAL THERAPY TREATEMENT   Patient Name: Heidi [name] MRN: [id number] DOB:Apr 19, 1950, 72 y.o., female Today's Date: 10/12/2022  END OF SESSION:  PT End of Session - 10/12/22 1252     Visit Number 3    Number of Visits 11    Date for PT Re-Evaluation 12/03/22    Authorization Type [employer] MCR    Progress Note Due on Visit 10    PT Start Time 1246    PT Stop Time 1330    PT Time Calculation (min) 44 min    Activity Tolerance Patient tolerated treatment well    Behavior During Therapy WFL for tasks assessed/performed               Past Medical History:  Diagnosis Date   Allergic rhinitis due to pollen    Allergy    Anemia, unspecified    Blood transfusion without reported diagnosis    1979   Disorder of bone and cartilage, unspecified    Eosinophilia    Headache 09/24/2016   Lymphocytosis (symptomatic)    Osteoporosis    Other abnormal blood chemistry    Other and unspecified hyperlipidemia    Pain in joint, shoulder region    Reflux esophagitis    Type II or unspecified type diabetes mellitus without mention of complication, uncontrolled    Type II or unspecified type diabetes mellitus without mention of complication, uncontrolled    Umbilical hernia without mention of obstruction or gangrene    Unspecified disorder of liver    Past Surgical History:  Procedure Laterality Date   CATARACT EXTRACTION, BILATERAL     CESAREAN SECTION     COLONOSCOPY  2015   Dr. Arlyce Dice, normal exam   Patient Active Problem List   Diagnosis Date Noted   Impingement syndrome of left shoulder 10/09/2019   Osteopenia 10/03/2019   Memory loss 06/23/2017   Gait abnormality 06/23/2017   Uterine prolapse 09/24/2016   Headache 09/24/2016   Hyponatremia 03/03/2015   Special screening for malignant neoplasms, colon 12/10/2012   Lymphocytosis (symptomatic)    Disorder of bone and cartilage    Type II or unspecified type diabetes mellitus without mention of complication,  uncontrolled    Reflux esophagitis    Anemia, unspecified    Other and unspecified hyperlipidemia    Lymphocytosis 01/16/2012   Idiopathic eosinophilia 04/05/2011   NONSPECIFIC ABNORMAL RESULTS LIVR FUNCTION STUDY 08/15/2008   ANEMIA, HX OF 08/15/2008     REFERRING PROVIDER:  Esperanza Richters, PA-C    REFERRING DIAG:  R26.9 (ICD-10-CM) - Gait disturbance    Rationale for Evaluation and Treatment: Rehabilitation  THERAPY DIAG:  Other abnormalities of gait and mobility  Muscle weakness (generalized)  ONSET DATE: a long time   SUBJECTIVE:  SUBJECTIVE STATEMENT: Some soreness after last session.   PERTINENT HISTORY:  See PMH  PAIN:  Are you having pain? No  PRECAUTIONS:  Fall  RED FLAGS: None   WEIGHT BEARING RESTRICTIONS:  No  FALLS:  Has patient fallen in last 6 months? No  LIVING ENVIRONMENT: Has stairs in home, lives with husband and daughter and her children  OCCUPATION:  retired  PLOF:  Independent  PATIENT GOALS:  Improve gait, improve balance, exercises  OBJECTIVE:   PATIENT SURVEYS:  ABC scale eval 9/16  1-30 9-40  2-30 10-40  3-20 11-30  4-30 1230  5-20 13-30  6-10 14-30  7-30 15-20  8-40 16-0    CO30GNITIVE STATUS: Within functional limits for tasks assessed   SENSATI30ON: WFL  POSTURE:  Eval: flexed at waist in standing- moreso on right with apparent thoracic levoscoliosis and Lt scapular winging   GAIT: Level of assistance: Complete Independence Comments: eval: flat foot strike, flexed posture, short step length   Body Part #1   EVAL: gross LE strength bilat 4-/5  Bil ankle DF to neutral      TREATMENT:                                                                                                                                  10/9: Nu-step L4 HR/TR 2x15 Standing marches x10  Sit to stands x15 (cues for hip extension)  Side stepping CGA 1/2 hall each Backwards walking 1/2 hall CGA (Cues for increased step length) Walking marches 1/2hall CGA Cone weaving x7 cones CGA Hurdle step overs fwd and lateral (4 hurdles) x3 laps ea Tandem walking with min rail support x2 laps CGA Romberg stance EC 20sec x3 Alternating step taps 6" 2x10ea- CGA, no rail support   10/2: Nu-step L4 HR/TR 2x10 Standing marches x10 Partial squats x10 cues for proper form.  Side stepping CGA 1/2 hall each Backwards walking 9ft CGA (Cues for increased step length) Walking marches (cues to remind her to bring knees up) Tandem walking with rail support x2 laps Romberg stance 30sec x2 Hurdles fwd and lateral (step to and reciprocal) Alternating step taps 6" 2x10ea   DATE: eval 9/16  Discussed trekking poles SLR SL hip abd Bridge ball bw knees Mod thomas Piriformis stretch Seated hamstring stretch Standing gastroc stretch   PATIENT EDUCATION:  Education details: Teacher, music of condition, POC, HEP, exercise form/rationale Person educated: Patient Education method: Explanation, Demonstration, Tactile cues, Verbal cues, and Handouts Education comprehension: verbalized understanding, returned demonstration, verbal cues required, tactile cues required, and needs further education  HOME EXERCISE PROGRAM: KYYRVLMN   ASSESSMENT:  CLINICAL IMPRESSION: Improved awareness of heel contact and toe off with gait. She also had improved self correction of posture with balance tasks and sit to stands. Challenged by hurdle step ups/overs initially as well as cone weaving, but improved after cues.    OBJECTIVE IMPAIRMENTS: Abnormal gait, decreased activity tolerance, decreased  balance, difficulty walking, decreased ROM, decreased strength, increased muscle spasms, impaired flexibility, improper body mechanics, postural  dysfunction, and pain.   ACTIVITY LIMITATIONS: standing, squatting, stairs, dressing, and locomotion level  PARTICIPATION LIMITATIONS: meal prep, cleaning, laundry, shopping, and community activity  PERSONAL FACTORS:  see pmh  are also affecting patient's functional outcome.   REHAB POTENTIAL: Good  CLINICAL DECISION MAKING: Unstable/unpredictable  EVALUATION COMPLEXITY: Moderate   GOALS: Goals reviewed with patient? Yes  SHORT TERM GOALS: Target date: 09/08/22  Pt will obtain LRAD for safety Baseline: Goal status: INITIAL    LONG TERM GOALS: Target date: POC date  Pt will demo gross 5/5 LE strength Baseline:  Goal status: INITIAL  2.  Ambulate with upright posture Baseline:  Goal status: INITIAL  3.  Demo increased speed of gait in hallway wihtout LOB Baseline:  Goal status: INITIAL  4.  Navigate around and over obstacles on the floor with good balance control Baseline:  Goal status: INITIAL     PLAN:  PT FREQUENCY: 1x/week  PT DURATION: 10 weeks  PLANNED INTERVENTIONS: Therapeutic exercises, Therapeutic activity, Neuromuscular re-education, Balance training, Gait training, Patient/Family education, Self Care, Joint mobilization, Stair training, Aquatic Therapy, Dry Needling, Electrical stimulation, Spinal mobilization, Cryotherapy, Moist heat, Taping, Ionotophoresis 4mg /ml Dexamethasone, Manual therapy, and Re-evaluation.  PLAN FOR NEXT SESSION: did she get trekking poles? Gross hip flexibility & strength- begin CKC balance   Riki Altes, PTA  10/12/22 2:22 PM

## 2022-10-18 ENCOUNTER — Ambulatory Visit (HOSPITAL_BASED_OUTPATIENT_CLINIC_OR_DEPARTMENT_OTHER): Payer: Medicare HMO | Admitting: Physical Therapy

## 2022-10-18 ENCOUNTER — Encounter (HOSPITAL_BASED_OUTPATIENT_CLINIC_OR_DEPARTMENT_OTHER): Payer: Self-pay | Admitting: Physical Therapy

## 2022-10-18 DIAGNOSIS — M6281 Muscle weakness (generalized): Secondary | ICD-10-CM | POA: Diagnosis not present

## 2022-10-18 DIAGNOSIS — R2689 Other abnormalities of gait and mobility: Secondary | ICD-10-CM | POA: Diagnosis not present

## 2022-10-18 NOTE — Therapy (Signed)
OUTPATIENT PHYSICAL THERAPY TREATEMENT   Patient Name: Heidi [name] MRN: [id number] DOB:1950/11/12, 72 y.o., female Today's Date: 10/18/2022  END OF SESSION:  PT End of Session - 10/18/22 1236     Visit Number 4    Number of Visits 11    Date for PT Re-Evaluation 12/03/22    Authorization Type [employer] MCR    Progress Note Due on Visit 10    PT Start Time 1151    PT Stop Time 1230    PT Time Calculation (min) 39 min    Equipment Utilized During Treatment Gait belt    Activity Tolerance Patient tolerated treatment well    Behavior During Therapy WFL for tasks assessed/performed                Past Medical History:  Diagnosis Date   Allergic rhinitis due to pollen    Allergy    Anemia, unspecified    Blood transfusion without reported diagnosis    1979   Disorder of bone and cartilage, unspecified    Eosinophilia    Headache 09/24/2016   Lymphocytosis (symptomatic)    Osteoporosis    Other abnormal blood chemistry    Other and unspecified hyperlipidemia    Pain in joint, shoulder region    Reflux esophagitis    Type II or unspecified type diabetes mellitus without mention of complication, uncontrolled    Type II or unspecified type diabetes mellitus without mention of complication, uncontrolled    Umbilical hernia without mention of obstruction or gangrene    Unspecified disorder of liver    Past Surgical History:  Procedure Laterality Date   CATARACT EXTRACTION, BILATERAL     CESAREAN SECTION     COLONOSCOPY  2015   Dr. Arlyce Dice, normal exam   Patient Active Problem List   Diagnosis Date Noted   Impingement syndrome of left shoulder 10/09/2019   Osteopenia 10/03/2019   Memory loss 06/23/2017   Gait abnormality 06/23/2017   Uterine prolapse 09/24/2016   Headache 09/24/2016   Hyponatremia 03/03/2015   Special screening for malignant neoplasms, colon 12/10/2012   Lymphocytosis (symptomatic)    Disorder of bone and cartilage    Type II or unspecified  type diabetes mellitus without mention of complication, uncontrolled    Reflux esophagitis    Anemia, unspecified    Other and unspecified hyperlipidemia    Lymphocytosis 01/16/2012   Idiopathic eosinophilia 04/05/2011   NONSPECIFIC ABNORMAL RESULTS LIVR FUNCTION STUDY 08/15/2008   ANEMIA, HX OF 08/15/2008     REFERRING PROVIDER:  Esperanza Richters, PA-C    REFERRING DIAG:  R26.9 (ICD-10-CM) - Gait disturbance    Rationale for Evaluation and Treatment: Rehabilitation  THERAPY DIAG:  Other abnormalities of gait and mobility  Muscle weakness (generalized)  ONSET DATE: a long time   SUBJECTIVE:  SUBJECTIVE STATEMENT: Doing exercises at home, denies pain.   PERTINENT HISTORY:  See PMH  PAIN:  Are you having pain? No  PRECAUTIONS:  Fall  RED FLAGS: None   WEIGHT BEARING RESTRICTIONS:  No  FALLS:  Has patient fallen in last 6 months? No  LIVING ENVIRONMENT: Has stairs in home, lives with husband and daughter and her children  OCCUPATION:  retired  PLOF:  Independent  PATIENT GOALS:  Improve gait, improve balance, exercises  OBJECTIVE:   PATIENT SURVEYS:  ABC scale eval 9/16  1-30 9-40  2-30 10-40  3-20 11-30  4-30 12-30  5-20 13-30  6-10 14-30  7-30 15-20  8-40 16-0    CO30GNITIVE STATUS: Within functional limits for tasks assessed   SENSATI30ON: WFL  POSTURE:  Eval: flexed at waist in standing- moreso on right with apparent thoracic levoscoliosis and Lt scapular winging   GAIT: Level of assistance: Complete Independence Comments: eval: flat foot strike, flexed posture, short step length   Body Part #1   EVAL: gross LE strength bilat 4-/5  Bil ankle DF to neutral      TREATMENT:                                                                                                                                Treatment                            10/15:  Nu step 5 min L5 SL balance while holding ha.f foam roll on top of bar Alt taps to 1/2 foam roll piece standing up- tap without knocking over Sit<>stand onto airex; added marches at top Tandem stance with head turns Tandem & SLS with use of feet to correct LOB rather than grabbing bar with hands. Gait training- increase step height for foot clearance, cues to use feet to correct LOB   10/9: Nu-step L4 HR/TR 2x15 Standing marches x10  Sit to stands x15 (cues for hip extension)  Side stepping CGA 1/2 hall each Backwards walking 1/2 hall CGA (Cues for increased step length) Walking marches 1/2hall CGA Cone weaving x7 cones CGA Hurdle step overs fwd and lateral (4 hurdles) x3 laps ea Tandem walking with min rail support x2 laps CGA Romberg stance EC 20sec x3 Alternating step taps 6" 2x10ea- CGA, no rail support   10/2: Nu-step L4 HR/TR 2x10 Standing marches x10 Partial squats x10 cues for proper form.  Side stepping CGA 1/2 hall each Backwards walking 49ft CGA (Cues for increased step length) Walking marches (cues to remind her to bring knees up) Tandem walking with rail support x2 laps Romberg stance 30sec x2 Hurdles fwd and lateral (step to and reciprocal) Alternating step taps 6" 2x10ea     PATIENT EDUCATION:  Education details: Anatomy of condition, POC, HEP, exercise form/rationale Person educated: Patient Education method: Explanation, Demonstration, Tactile cues, Verbal cues, and Handouts  Education comprehension: verbalized understanding, returned demonstration, verbal cues required, tactile cues required, and needs further education  HOME EXERCISE PROGRAM: KYYRVLMN Pick feet up when walking, use feet to correct LOB- reduce use of hands grabbing objects    ASSESSMENT:  CLINICAL IMPRESSION: Pt does very well in balance challenges- noted that she  has a LOB when her feet do not clear the floor. Audible shuffling on right side with almost every step and Lt about 50%.    OBJECTIVE IMPAIRMENTS: Abnormal gait, decreased activity tolerance, decreased balance, difficulty walking, decreased ROM, decreased strength, increased muscle spasms, impaired flexibility, improper body mechanics, postural dysfunction, and pain.   ACTIVITY LIMITATIONS: standing, squatting, stairs, dressing, and locomotion level  PARTICIPATION LIMITATIONS: meal prep, cleaning, laundry, shopping, and community activity  PERSONAL FACTORS:  see pmh  are also affecting patient's functional outcome.   REHAB POTENTIAL: Good  CLINICAL DECISION MAKING: Unstable/unpredictable  EVALUATION COMPLEXITY: Moderate   GOALS: Goals reviewed with patient? Yes  SHORT TERM GOALS: Target date: 09/08/22  Pt will obtain LRAD for safety Baseline: Goal status: not met- she has not purchased anything    LONG TERM GOALS: Target date: POC date  Pt will demo gross 5/5 LE strength Baseline:  Goal status: INITIAL  2.  Ambulate with upright posture Baseline:  Goal status: INITIAL  3.  Demo increased speed of gait in hallway wihtout LOB Baseline:  Goal status: INITIAL  4.  Navigate around and over obstacles on the floor with good balance control Baseline:  Goal status: INITIAL     PLAN:  PT FREQUENCY: 1x/week  PT DURATION: 10 weeks  PLANNED INTERVENTIONS: Therapeutic exercises, Therapeutic activity, Neuromuscular re-education, Balance training, Gait training, Patient/Family education, Self Care, Joint mobilization, Stair training, Aquatic Therapy, Dry Needling, Electrical stimulation, Spinal mobilization, Cryotherapy, Moist heat, Taping, Ionotophoresis 4mg /ml Dexamethasone, Manual therapy, and Re-evaluation.  PLAN FOR NEXT SESSION: did she get trekking poles? Gross hip flexibility & strength- begin CKC balance  Marvalene Barrett C. Tonantzin Mimnaugh PT, DPT 10/18/22 12:39 PM

## 2022-10-25 ENCOUNTER — Encounter (HOSPITAL_BASED_OUTPATIENT_CLINIC_OR_DEPARTMENT_OTHER): Payer: Self-pay | Admitting: Physical Therapy

## 2022-10-25 ENCOUNTER — Ambulatory Visit (HOSPITAL_BASED_OUTPATIENT_CLINIC_OR_DEPARTMENT_OTHER): Payer: Medicare HMO | Admitting: Physical Therapy

## 2022-10-25 DIAGNOSIS — R2689 Other abnormalities of gait and mobility: Secondary | ICD-10-CM | POA: Diagnosis not present

## 2022-10-25 DIAGNOSIS — M6281 Muscle weakness (generalized): Secondary | ICD-10-CM

## 2022-10-25 NOTE — Therapy (Signed)
OUTPATIENT PHYSICAL THERAPY TREATEMENT   Patient Name: Heidi [name] MRN: [id number] DOB:1950-06-26, 72 y.o., female Today's Date: 10/25/2022  END OF SESSION:  PT End of Session - 10/25/22 1149     Visit Number 5    Number of Visits 11    Date for PT Re-Evaluation 12/03/22    Authorization Type [employer] MCR    Progress Note Due on Visit 10    PT Start Time 1148    PT Stop Time 1220    PT Time Calculation (min) 32 min    Equipment Utilized During Treatment Gait belt    Activity Tolerance Patient tolerated treatment well    Behavior During Therapy WFL for tasks assessed/performed                Past Medical History:  Diagnosis Date   Allergic rhinitis due to pollen    Allergy    Anemia, unspecified    Blood transfusion without reported diagnosis    1979   Disorder of bone and cartilage, unspecified    Eosinophilia    Headache 09/24/2016   Lymphocytosis (symptomatic)    Osteoporosis    Other abnormal blood chemistry    Other and unspecified hyperlipidemia    Pain in joint, shoulder region    Reflux esophagitis    Type II or unspecified type diabetes mellitus without mention of complication, uncontrolled    Type II or unspecified type diabetes mellitus without mention of complication, uncontrolled    Umbilical hernia without mention of obstruction or gangrene    Unspecified disorder of liver    Past Surgical History:  Procedure Laterality Date   CATARACT EXTRACTION, BILATERAL     CESAREAN SECTION     COLONOSCOPY  2015   Dr. Arlyce Dice, normal exam   Patient Active Problem List   Diagnosis Date Noted   Impingement syndrome of left shoulder 10/09/2019   Osteopenia 10/03/2019   Memory loss 06/23/2017   Gait abnormality 06/23/2017   Uterine prolapse 09/24/2016   Headache 09/24/2016   Hyponatremia 03/03/2015   Special screening for malignant neoplasms, colon 12/10/2012   Lymphocytosis (symptomatic)    Disorder of bone and cartilage    Type II or unspecified  type diabetes mellitus without mention of complication, uncontrolled    Reflux esophagitis    Anemia, unspecified    Other and unspecified hyperlipidemia    Lymphocytosis 01/16/2012   Idiopathic eosinophilia 04/05/2011   NONSPECIFIC ABNORMAL RESULTS LIVR FUNCTION STUDY 08/15/2008   ANEMIA, HX OF 08/15/2008     REFERRING PROVIDER:  Esperanza Richters, PA-C    REFERRING DIAG:  R26.9 (ICD-10-CM) - Gait disturbance    Rationale for Evaluation and Treatment: Rehabilitation  THERAPY DIAG:  Other abnormalities of gait and mobility  Muscle weakness (generalized)  ONSET DATE: a long time   SUBJECTIVE:  SUBJECTIVE STATEMENT: When I noticed I was shuffling, I would stop, stand upright and then go on.   PERTINENT HISTORY:  See PMH  PAIN:  Are you having pain? No  PRECAUTIONS:  Fall  RED FLAGS: None   WEIGHT BEARING RESTRICTIONS:  No  FALLS:  Has patient fallen in last 6 months? No  LIVING ENVIRONMENT: Has stairs in home, lives with husband and daughter and her children  OCCUPATION:  retired  PLOF:  Independent  PATIENT GOALS:  Improve gait, improve balance, exercises  OBJECTIVE:   PATIENT SURVEYS:  ABC scale eval 9/16  1-30 9-40  2-30 10-40  3-20 11-30  4-30 12-30  5-20 13-30  6-10 14-30  7-30 15-20  8-40 16-0    CO30GNITIVE STATUS: Within functional limits for tasks assessed   SENSATI30ON: WFL  POSTURE:  Eval: flexed at waist in standing- moreso on right with apparent thoracic levoscoliosis and Lt scapular winging   GAIT: Level of assistance: Complete Independence Comments: eval: flat foot strike, flexed posture, short step length   Body Part #1   EVAL: gross LE strength bilat 4-/5  Bil ankle DF to neutral      TREATMENT:                                                                                                                                Treatment                            10/22:  Nu step 5 min UE & LE L8 6" step up & reach to place sticky note high on wall Stepping over hurdles- fwd & lateral High knee walking Large amplitude lateral stepping   Treatment                            10/15:  Nu step 5 min L5 SL balance while holding ha.f foam roll on top of bar Alt taps to 1/2 foam roll piece standing up- tap without knocking over Sit<>stand onto airex; added marches at top Tandem stance with head turns Tandem & SLS with use of feet to correct LOB rather than grabbing bar with hands. Gait training- increase step height for foot clearance, cues to use feet to correct LOB   10/9: Nu-step L4 HR/TR 2x15 Standing marches x10  Sit to stands x15 (cues for hip extension)  Side stepping CGA 1/2 hall each Backwards walking 1/2 hall CGA (Cues for increased step length) Walking marches 1/2hall CGA Cone weaving x7 cones CGA Hurdle step overs fwd and lateral (4 hurdles) x3 laps ea Tandem walking with min rail support x2 laps CGA Romberg stance EC 20sec x3 Alternating step taps 6" 2x10ea- CGA, no rail support   10/2: Nu-step L4 HR/TR 2x10 Standing marches x10 Partial squats x10 cues for proper form.  Side stepping CGA 1/2  hall each Backwards walking 17ft CGA (Cues for increased step length) Walking marches (cues to remind her to bring knees up) Tandem walking with rail support x2 laps Romberg stance 30sec x2 Hurdles fwd and lateral (step to and reciprocal) Alternating step taps 6" 2x10ea     PATIENT EDUCATION:  Education details: Anatomy of condition, POC, HEP, exercise form/rationale Person educated: Patient Education method: Explanation, Demonstration, Tactile cues, Verbal cues, and Handouts Education comprehension: verbalized understanding, returned demonstration, verbal cues required, tactile cues  required, and needs further education  HOME EXERCISE PROGRAM: KYYRVLMN Pick feet up when walking, use feet to correct LOB- reduce use of hands grabbing objects    ASSESSMENT:  CLINICAL IMPRESSION: Pt is ambulating with improved upright posture but continues to drag her heels very frequently. She does report she is aware and will continue to correct as she notices.overall her balance in large motions is doing very well and confidence has improved- less use of UEs to correct LOB.    OBJECTIVE IMPAIRMENTS: Abnormal gait, decreased activity tolerance, decreased balance, difficulty walking, decreased ROM, decreased strength, increased muscle spasms, impaired flexibility, improper body mechanics, postural dysfunction, and pain.   ACTIVITY LIMITATIONS: standing, squatting, stairs, dressing, and locomotion level  PARTICIPATION LIMITATIONS: meal prep, cleaning, laundry, shopping, and community activity  PERSONAL FACTORS:  see pmh  are also affecting patient's functional outcome.   REHAB POTENTIAL: Good  CLINICAL DECISION MAKING: Unstable/unpredictable  EVALUATION COMPLEXITY: Moderate   GOALS: Goals reviewed with patient? Yes  SHORT TERM GOALS: Target date: 09/08/22  Pt will obtain LRAD for safety Baseline: Goal status: not met- she has not purchased anything    LONG TERM GOALS: Target date: POC date  Pt will demo gross 5/5 LE strength Baseline:  Goal status: INITIAL  2.  Ambulate with upright posture Baseline:  Goal status: INITIAL  3.  Demo increased speed of gait in hallway wihtout LOB Baseline:  Goal status: INITIAL  4.  Navigate around and over obstacles on the floor with good balance control Baseline:  Goal status: INITIAL     PLAN:  PT FREQUENCY: 1x/week  PT DURATION: 10 weeks  PLANNED INTERVENTIONS: Therapeutic exercises, Therapeutic activity, Neuromuscular re-education, Balance training, Gait training, Patient/Family education, Self Care, Joint  mobilization, Stair training, Aquatic Therapy, Dry Needling, Electrical stimulation, Spinal mobilization, Cryotherapy, Moist heat, Taping, Ionotophoresis 4mg /ml Dexamethasone, Manual therapy, and Re-evaluation.  PLAN FOR NEXT SESSION: ckc balance, review high knees & lateral steps  Saraphina Lauderbaugh C. Savio Albrecht PT, DPT 10/25/22 12:36 PM

## 2022-10-31 DIAGNOSIS — Z7952 Long term (current) use of systemic steroids: Secondary | ICD-10-CM | POA: Diagnosis not present

## 2022-10-31 DIAGNOSIS — E559 Vitamin D deficiency, unspecified: Secondary | ICD-10-CM | POA: Diagnosis not present

## 2022-10-31 DIAGNOSIS — G919 Hydrocephalus, unspecified: Secondary | ICD-10-CM | POA: Diagnosis not present

## 2022-10-31 DIAGNOSIS — E119 Type 2 diabetes mellitus without complications: Secondary | ICD-10-CM | POA: Diagnosis not present

## 2022-10-31 DIAGNOSIS — E871 Hypo-osmolality and hyponatremia: Secondary | ICD-10-CM | POA: Diagnosis not present

## 2022-10-31 DIAGNOSIS — M810 Age-related osteoporosis without current pathological fracture: Secondary | ICD-10-CM | POA: Diagnosis not present

## 2022-10-31 DIAGNOSIS — E236 Other disorders of pituitary gland: Secondary | ICD-10-CM | POA: Diagnosis not present

## 2022-10-31 DIAGNOSIS — E2740 Unspecified adrenocortical insufficiency: Secondary | ICD-10-CM | POA: Diagnosis not present

## 2022-10-31 DIAGNOSIS — Z23 Encounter for immunization: Secondary | ICD-10-CM | POA: Diagnosis not present

## 2022-11-01 ENCOUNTER — Encounter (HOSPITAL_BASED_OUTPATIENT_CLINIC_OR_DEPARTMENT_OTHER): Payer: Self-pay

## 2022-11-01 ENCOUNTER — Ambulatory Visit (HOSPITAL_BASED_OUTPATIENT_CLINIC_OR_DEPARTMENT_OTHER): Payer: Medicare HMO

## 2022-11-01 DIAGNOSIS — R2689 Other abnormalities of gait and mobility: Secondary | ICD-10-CM

## 2022-11-01 DIAGNOSIS — M6281 Muscle weakness (generalized): Secondary | ICD-10-CM

## 2022-11-01 NOTE — Therapy (Signed)
OUTPATIENT PHYSICAL THERAPY TREATEMENT   Patient Name: Heidi [name] MRN: [id number] DOB:30-May-1950, 72 y.o., female Today's Date: 11/01/2022  END OF SESSION:  PT End of Session - 11/01/22 1151     Visit Number 6    Number of Visits 11    Date for PT Re-Evaluation 12/03/22    Authorization Type [employer] MCR    Progress Note Due on Visit 10    PT Start Time 1147    PT Stop Time 1230    PT Time Calculation (min) 43 min    Equipment Utilized During Treatment Gait belt    Activity Tolerance Patient tolerated treatment well    Behavior During Therapy WFL for tasks assessed/performed                 Past Medical History:  Diagnosis Date   Allergic rhinitis due to pollen    Allergy    Anemia, unspecified    Blood transfusion without reported diagnosis    1979   Disorder of bone and cartilage, unspecified    Eosinophilia    Headache 09/24/2016   Lymphocytosis (symptomatic)    Osteoporosis    Other abnormal blood chemistry    Other and unspecified hyperlipidemia    Pain in joint, shoulder region    Reflux esophagitis    Type II or unspecified type diabetes mellitus without mention of complication, uncontrolled    Type II or unspecified type diabetes mellitus without mention of complication, uncontrolled    Umbilical hernia without mention of obstruction or gangrene    Unspecified disorder of liver    Past Surgical History:  Procedure Laterality Date   CATARACT EXTRACTION, BILATERAL     CESAREAN SECTION     COLONOSCOPY  2015   Dr. Arlyce Dice, normal exam   Patient Active Problem List   Diagnosis Date Noted   Impingement syndrome of left shoulder 10/09/2019   Osteopenia 10/03/2019   Memory loss 06/23/2017   Gait abnormality 06/23/2017   Uterine prolapse 09/24/2016   Headache 09/24/2016   Hyponatremia 03/03/2015   Special screening for malignant neoplasms, colon 12/10/2012   Lymphocytosis (symptomatic)    Disorder of bone and cartilage    Type II or unspecified  type diabetes mellitus without mention of complication, uncontrolled    Reflux esophagitis    Anemia, unspecified    Other and unspecified hyperlipidemia    Lymphocytosis 01/16/2012   Idiopathic eosinophilia 04/05/2011   NONSPECIFIC ABNORMAL RESULTS LIVR FUNCTION STUDY 08/15/2008   ANEMIA, HX OF 08/15/2008     REFERRING PROVIDER:  Esperanza Richters, PA-C    REFERRING DIAG:  R26.9 (ICD-10-CM) - Gait disturbance    Rationale for Evaluation and Treatment: Rehabilitation  THERAPY DIAG:  Other abnormalities of gait and mobility  Muscle weakness (generalized)  ONSET DATE: a long time   SUBJECTIVE:  SUBJECTIVE STATEMENT: Pt reports no new complaints.   PERTINENT HISTORY:  See PMH  PAIN:  Are you having pain? No  PRECAUTIONS:  Fall  RED FLAGS: None   WEIGHT BEARING RESTRICTIONS:  No  FALLS:  Has patient fallen in last 6 months? No  LIVING ENVIRONMENT: Has stairs in home, lives with husband and daughter and her children  OCCUPATION:  retired  PLOF:  Independent  PATIENT GOALS:  Improve gait, improve balance, exercises  OBJECTIVE:   PATIENT SURVEYS:  ABC scale eval 9/16  1-30 9-40  2-30 10-40  3-20 11-30  4-30 12-30  5-20 13-30  6-10 14-30  7-30 15-20  8-40 16-0    CO30GNITIVE STATUS: Within functional limits for tasks assessed   SENSATI30ON: WFL  POSTURE:  Eval: flexed at waist in standing- moreso on right with apparent thoracic levoscoliosis and Lt scapular winging   GAIT: Level of assistance: Complete Independence Comments: eval: flat foot strike, flexed posture, short step length   Body Part #1   EVAL: gross LE strength bilat 4-/5  Bil ankle DF to neutral      TREATMENT:                                                                                                                                Treatment                            10/29:  Nu step 5 min UE & LE L8 Stepping over hurdles- fwd & lateral reciprocal  High knee walking in hall (constant verbal cues) Long steps in hall Large amplitude lateral stepping in hall  Alt taps to 1/2 foam roll piece standing up- tap without knocking over Gait training hall x1 lap cues for heel contact Backwards walking, 1/2 hall CGA   Treatment                            10/22:  Nu step 5 min UE & LE L8 6" step up & reach to place sticky note high on wall Stepping over hurdles- fwd & lateral High knee walking Large amplitude lateral stepping   Treatment                            10/15:  Nu step 5 min L5 SL balance while holding ha.f foam roll on top of bar Alt taps to 1/2 foam roll piece standing up- tap without knocking over Sit<>stand onto airex; added marches at top Tandem stance with head turns Tandem & SLS with use of feet to correct LOB rather than grabbing bar with hands. Gait training- increase step height for foot clearance, cues to use feet to correct LOB   10/9: Nu-step L4 HR/TR 2x15 Standing marches x10  Sit to stands x15 (cues for hip  extension)  Side stepping CGA 1/2 hall each Backwards walking 1/2 hall CGA (Cues for increased step length) Walking marches 1/2hall CGA Cone weaving x7 cones CGA Hurdle step overs fwd and lateral (4 hurdles) x3 laps ea Tandem walking with min rail support x2 laps CGA Romberg stance EC 20sec x3 Alternating step taps 6" 2x10ea- CGA, no rail support   PATIENT EDUCATION:  Education details: Anatomy of condition, POC, HEP, exercise form/rationale Person educated: Patient Education method: Explanation, Demonstration, Tactile cues, Verbal cues, and Handouts Education comprehension: verbalized understanding, returned demonstration, verbal cues required, tactile cues required, and needs further education  HOME EXERCISE  PROGRAM: KYYRVLMN Pick feet up when walking, use feet to correct LOB- reduce use of hands grabbing objects    ASSESSMENT:  CLINICAL IMPRESSION: Pt continues to revert to shuffling gait pattern in absence of cuing. With gait specific tasks, she requires very frequent cues to maintain focus on task. She is challenged by repeated hip flexion citing fatigue. Continues to benefit from functional and [facility] based challenges. Will continue to address her ongoing deficits in clinic.    OBJECTIVE IMPAIRMENTS: Abnormal gait, decreased activity tolerance, decreased balance, difficulty walking, decreased ROM, decreased strength, increased muscle spasms, impaired flexibility, improper body mechanics, postural dysfunction, and pain.   ACTIVITY LIMITATIONS: standing, squatting, stairs, dressing, and locomotion level  PARTICIPATION LIMITATIONS: meal prep, cleaning, laundry, shopping, and community activity  PERSONAL FACTORS:  see pmh  are also affecting patient's functional outcome.   REHAB POTENTIAL: Good  CLINICAL DECISION MAKING: Unstable/unpredictable  EVALUATION COMPLEXITY: Moderate   GOALS: Goals reviewed with patient? Yes  SHORT TERM GOALS: Target date: 09/08/22  Pt will obtain LRAD for safety Baseline: Goal status: not met- she has not purchased anything    LONG TERM GOALS: Target date: POC date  Pt will demo gross 5/5 LE strength Baseline:  Goal status: INITIAL  2.  Ambulate with upright posture Baseline:  Goal status: INITIAL  3.  Demo increased speed of gait in hallway wihtout LOB Baseline:  Goal status: INITIAL  4.  Navigate around and over obstacles on the floor with good balance control Baseline:  Goal status: INITIAL     PLAN:  PT FREQUENCY: 1x/week  PT DURATION: 10 weeks  PLANNED INTERVENTIONS: Therapeutic exercises, Therapeutic activity, Neuromuscular re-education, Balance training, Gait training, Patient/Family education, Self Care, Joint mobilization,  Stair training, Aquatic Therapy, Dry Needling, Electrical stimulation, Spinal mobilization, Cryotherapy, Moist heat, Taping, Ionotophoresis 4mg /ml Dexamethasone, Manual therapy, and Re-evaluation.  PLAN FOR NEXT SESSION: ckc balance, review high knees & lateral steps  Riki Altes, PTA  11/01/22 4:02 PM

## 2022-11-09 ENCOUNTER — Ambulatory Visit (HOSPITAL_BASED_OUTPATIENT_CLINIC_OR_DEPARTMENT_OTHER): Payer: Medicare HMO | Attending: Medical | Admitting: Physical Therapy

## 2022-11-09 ENCOUNTER — Encounter (HOSPITAL_BASED_OUTPATIENT_CLINIC_OR_DEPARTMENT_OTHER): Payer: Self-pay | Admitting: Physical Therapy

## 2022-11-09 DIAGNOSIS — M6281 Muscle weakness (generalized): Secondary | ICD-10-CM | POA: Diagnosis not present

## 2022-11-09 DIAGNOSIS — R2689 Other abnormalities of gait and mobility: Secondary | ICD-10-CM | POA: Diagnosis not present

## 2022-11-09 NOTE — Therapy (Signed)
OUTPATIENT PHYSICAL THERAPY TREATEMENT/discharge   Patient Name: Heidi [name] MRN: [id number] DOB:February 19, 1950, 72 y.o., female Today's Date: 11/09/2022  END OF SESSION:  PT End of Session - 11/09/22 1235     Visit Number 7    Number of Visits 11    Date for PT Re-Evaluation 12/03/22    Authorization Type [employer] MCR    Progress Note Due on Visit 10    PT Start Time 1232    PT Stop Time 1300    PT Time Calculation (min) 28 min    Equipment Utilized During Treatment Gait belt    Activity Tolerance Patient tolerated treatment well    Behavior During Therapy WFL for tasks assessed/performed                  Past Medical History:  Diagnosis Date   Allergic rhinitis due to pollen    Allergy    Anemia, unspecified    Blood transfusion without reported diagnosis    1979   Disorder of bone and cartilage, unspecified    Eosinophilia    Headache 09/24/2016   Lymphocytosis (symptomatic)    Osteoporosis    Other abnormal blood chemistry    Other and unspecified hyperlipidemia    Pain in joint, shoulder region    Reflux esophagitis    Type II or unspecified type diabetes mellitus without mention of complication, uncontrolled    Type II or unspecified type diabetes mellitus without mention of complication, uncontrolled    Umbilical hernia without mention of obstruction or gangrene    Unspecified disorder of liver    Past Surgical History:  Procedure Laterality Date   CATARACT EXTRACTION, BILATERAL     CESAREAN SECTION     COLONOSCOPY  2015   Dr. Arlyce Dice, normal exam   Patient Active Problem List   Diagnosis Date Noted   Impingement syndrome of left shoulder 10/09/2019   Osteopenia 10/03/2019   Memory loss 06/23/2017   Gait abnormality 06/23/2017   Uterine prolapse 09/24/2016   Headache 09/24/2016   Hyponatremia 03/03/2015   Special screening for malignant neoplasms, colon 12/10/2012   Lymphocytosis (symptomatic)    Disorder of bone and cartilage    Type II or  unspecified type diabetes mellitus without mention of complication, uncontrolled    Reflux esophagitis    Anemia, unspecified    Other and unspecified hyperlipidemia    Lymphocytosis 01/16/2012   Idiopathic eosinophilia 04/05/2011   NONSPECIFIC ABNORMAL RESULTS LIVR FUNCTION STUDY 08/15/2008   ANEMIA, HX OF 08/15/2008     REFERRING PROVIDER:  Esperanza Richters, PA-C    REFERRING DIAG:  R26.9 (ICD-10-CM) - Gait disturbance    Rationale for Evaluation and Treatment: Rehabilitation  THERAPY DIAG:  Other abnormalities of gait and mobility  Muscle weakness (generalized)  ONSET DATE: a long time   SUBJECTIVE:  SUBJECTIVE STATEMENT: I am doing good. Trying to do stairs 3-4 times/day. Plan to join husband at the gym.   PERTINENT HISTORY:  See PMH  PAIN:  Are you having pain? No  PRECAUTIONS:  Fall  RED FLAGS: None   WEIGHT BEARING RESTRICTIONS:  No  FALLS:  Has patient fallen in last 6 months? No  LIVING ENVIRONMENT: Has stairs in home, lives with husband and daughter and her children  OCCUPATION:  retired  PLOF:  Independent  PATIENT GOALS:  Improve gait, improve balance, exercises  OBJECTIVE:   PATIENT SURVEYS:  ABC scale eval 9/16  1-30 9-40  2-30 10-40  3-20 11-30  4-30 12-30  5-20 13-30  6-10 14-30  7-30 15-20  8-40 16-0   ABC scale 11/6  1-40 9-50  2-40 10-45  3-40 11-45  4-40 12-45  5-60 13-45  6-40 14-50  7-40 15-45  8-45 16-0    POSTURE:  Eval: flexed at waist in standing- moreso on right with apparent thoracic levoscoliosis and Lt scapular winging 11/6: stacked posture with anterior pelvic tilt   GAIT: Level of assistance: Complete Independence Comments: eval: flat foot strike, flexed posture, short step length   Body Part #1   EVAL:  gross LE strength bilat 4-/5  Bil ankle DF to neutral   11/6: gross LE strength 5/5, bil ankle DF past neutral visual at 5 deg     TREATMENT:                                                                                                                               Treatment                            11/09/22:  ABC scale & goals discussion Runners march Lateral stepping   Treatment                            10/29:  Nu step 5 min UE & LE L8 Stepping over hurdles- fwd & lateral reciprocal  High knee walking in hall (constant verbal cues) Long steps in hall Large amplitude lateral stepping in hall  Alt taps to 1/2 foam roll piece standing up- tap without knocking over Gait training hall x1 lap cues for heel contact Backwards walking, 1/2 hall CGA   Treatment                            10/22:  Nu step 5 min UE & LE L8 6" step up & reach to place sticky note high on wall Stepping over hurdles- fwd & lateral High knee walking Large amplitude lateral stepping    PATIENT EDUCATION:  Education details: Anatomy of condition, POC, HEP, exercise form/rationale Person educated: Patient Education method: Explanation, Demonstration, Tactile cues, Verbal cues, and Handouts Education comprehension: verbalized understanding,  returned demonstration, verbal cues required, tactile cues required, and needs further education  HOME EXERCISE PROGRAM: KYYRVLMN Pick feet up when walking, use feet to correct LOB- reduce use of hands grabbing objects    ASSESSMENT:  CLINICAL IMPRESSION: Pt has met all of her goals at this time and is prepared for d/c to independent program.Encouraged her to continue exercises and reach out with any questions.    OBJECTIVE IMPAIRMENTS: Abnormal gait, decreased activity tolerance, decreased balance, difficulty walking, decreased ROM, decreased strength, increased muscle spasms, impaired flexibility, improper body mechanics, postural dysfunction, and pain.    ACTIVITY LIMITATIONS: standing, squatting, stairs, dressing, and locomotion level  PARTICIPATION LIMITATIONS: meal prep, cleaning, laundry, shopping, and community activity  PERSONAL FACTORS:  see pmh  are also affecting patient's functional outcome.   REHAB POTENTIAL: Good  CLINICAL DECISION MAKING: Unstable/unpredictable  EVALUATION COMPLEXITY: Moderate   GOALS: Goals reviewed with patient? Yes  SHORT TERM GOALS: Target date: 09/08/22  Pt will obtain LRAD for safety Baseline: Goal status: achieved- does not require AD    LONG TERM GOALS: Target date: POC date  Pt will demo gross 5/5 LE strength Baseline:  Goal status: achieved  2.  Ambulate with upright posture Baseline:  Goal status: partially met- verbal cues for reminders  3.  Demo increased speed of gait in hallway wihtout LOB Baseline:  Goal status: achieved  4.  Navigate around and over obstacles on the floor with good balance control Baseline:  Goal status: achieved     PLAN:  PT FREQUENCY: 1x/week  PT DURATION: 10 weeks  PLANNED INTERVENTIONS: Therapeutic exercises, Therapeutic activity, Neuromuscular re-education, Balance training, Gait training, Patient/Family education, Self Care, Joint mobilization, Stair training, Aquatic Therapy, Dry Needling, Electrical stimulation, Spinal mobilization, Cryotherapy, Moist heat, Taping, Ionotophoresis 4mg /ml Dexamethasone, Manual therapy, and Re-evaluation.  Kritika Stukes C. [name] PT, DPT 11/09/22 1:02 PM

## 2022-11-15 ENCOUNTER — Encounter (HOSPITAL_BASED_OUTPATIENT_CLINIC_OR_DEPARTMENT_OTHER): Payer: Medicare HMO

## 2022-11-21 ENCOUNTER — Ambulatory Visit: Payer: Medicare HMO | Admitting: Diagnostic Neuroimaging

## 2022-11-21 ENCOUNTER — Encounter: Payer: Self-pay | Admitting: Diagnostic Neuroimaging

## 2022-11-21 VITALS — BP 138/69 | HR 70 | Ht 61.0 in | Wt 166.0 lb

## 2022-11-21 DIAGNOSIS — R269 Unspecified abnormalities of gait and mobility: Secondary | ICD-10-CM | POA: Diagnosis not present

## 2022-11-21 DIAGNOSIS — R413 Other amnesia: Secondary | ICD-10-CM

## 2022-11-21 NOTE — Progress Notes (Signed)
[facility]  PATIENT: Heidi [name] DOB: June 30, 1950  REFERRING CLINICIAN: Saguier, Ramon Dredge, PA-C  HISTORY FROM: patient  REASON FOR VISIT: follow up   HISTORICAL  CHIEF COMPLAINT:  Chief Complaint  Patient presents with   New Patient (Initial Visit)    Patient in room #6 with her husband. Patient states she here to follow on a memory issues.    HISTORY OF PRESENT ILLNESS:   UPDATE (11/21/22, VRP): Since last visit, doing better with memory. Continues with stooped posture and gait issues. No falls. Has been through PT. No back pain. Some pain in right calf with walking.   UPDATE (12/13/17, VRP): Since last visit, doing well. Symptoms are improved. Memory is better. Dizziness is better. No alleviating or aggravating factors. Had second opinion with [facility] clinic, who felt patient likely had MCI.   UPDATE (06/13/17, VRP): 72 year old female with progressive short term memory loss and gait diff x 1-2 years. Also retired ~ 2 yrs ago, due to age. Had MRI brain and this shows atrophy and ventriculomegaly. No alleviating or aggravating factors. No clear loss of ADLs and ability to be independent outside of home, but patient no longer driving, so not known how she would do independently. Some slowness of walking and physical movements.   UPDATE 12/02/13: Since last visit, continues to do well. No more dizziness. Only some intermittent ringing in ears, mild. Overall, feels well.  PRIOR HPI (10/18/13): 72 year old female with diabetes and hypercholesterolemia, here for evaluation of dizziness and vertigo. 10/15/13 in the evening patient had sudden onset of room spinning sensation with nausea, sweating, vomiting. No headache, vision changes, pain. No numbness in the hands or feet. No weakness. No recent infections, trauma, change in medication. Patient went to urgent care on 10/16/13, had blood work and CT scan ordered, which were unremarkable. Patient symptoms improved  yesterday and today are significantly better. Has been taking Zofran as needed for nausea.   REVIEW OF SYSTEMS: Full 14 system review of systems performed and negative except: depression memory loss.   ALLERGIES: Allergies  Allergen Reactions   Ciprofloxacin Anaphylaxis    Patient states she gets "CDIFF" Family states this is not correct and this is not an allergy   Alendronate Sodium     Other Reaction(s): Unknown    HOME MEDICATIONS: Outpatient Medications Prior to Visit  Medication Sig Dispense Refill   calcium-vitamin D (OSCAL WITH D) 500-200 MG-UNIT per tablet Take 1 tablet by mouth daily.     CEQUA 0.09 % SOLN Apply 1 drop to eye 2 (two) times daily.     cetirizine (ZYRTEC) 10 MG chewable tablet Chew 10 mg by mouth daily.     hydrocortisone (CORTEF) 10 MG tablet Take 1 tablet (10 mg total) by mouth 2 (two) times daily. (Patient taking differently: Take 10 mg by mouth 2 (two) times daily. One tablet (10 mg) in morning and one half (5mg ) at night) 30 tablet 0   Multiple Vitamin (MULTIVITAMIN) tablet Take 1 tablet by mouth daily.     simvastatin (ZOCOR) 20 MG tablet Take 1 tablet (20 mg total) by mouth at bedtime. 90 tablet 0   No facility-administered medications prior to visit.    PAST MEDICAL HISTORY: Past Medical History:  Diagnosis Date   Allergic rhinitis due to pollen    Allergy    Anemia, unspecified    Blood transfusion without reported diagnosis    1979   Disorder of bone and cartilage, unspecified  Eosinophilia    Headache 09/24/2016   Lymphocytosis (symptomatic)    Osteoporosis    Other abnormal blood chemistry    Other and unspecified hyperlipidemia    Pain in joint, shoulder region    Reflux esophagitis    Type II or unspecified type diabetes mellitus without mention of complication, uncontrolled    Type II or unspecified type diabetes mellitus without mention of complication, uncontrolled    Umbilical hernia without mention of obstruction or gangrene     Unspecified disorder of liver     PAST SURGICAL HISTORY: Past Surgical History:  Procedure Laterality Date   CATARACT EXTRACTION, BILATERAL     CESAREAN SECTION     COLONOSCOPY  2015   Dr. Arlyce Dice, normal exam    FAMILY HISTORY: Family History  Problem Relation Age of Onset   Pancreatic cancer Neg Hx    Colon cancer Neg Hx    Esophageal cancer Neg Hx    Stomach cancer Neg Hx     SOCIAL HISTORY:  Social History   Socioeconomic History   Marital status: Married    Spouse name: Armed forces training and education officer   Number of children: 4   Years of education: BSN   Highest education level: Not on file  Occupational History   Occupation: Teacher, adult education: [employer]  Tobacco Use   Smoking status: Never   Smokeless tobacco: Never  Substance and Sexual Activity   Alcohol use: No   Drug use: No   Sexual activity: Not on file  Other Topics Concern   Not on file  Social History Narrative   Married   Never smoked   Alcohol none   Exercise walk 2-3 times week   Children 4   BS Nursing , retired         International aid/development worker of Health   Financial Resource Strain: Low Risk  (02/12/2021)   Overall Financial Resource Strain (CARDIA)    Difficulty of Paying Living Expenses: Not hard at all  Food Insecurity: No Food Insecurity (02/12/2021)   Hunger Vital Sign    Worried About Running Out of Food in the Last Year: Never true    Ran Out of Food in the Last Year: Never true  Transportation Needs: No Transportation Needs (02/12/2021)   PRAPARE - Administrator, Civil Service (Medical): No    Lack of Transportation (Non-Medical): No  Physical Activity: Unknown (02/12/2021)   Exercise Vital Sign    Days of Exercise per Week: Not on file    Minutes of Exercise per Session: 30 min  Stress: No Stress Concern Present (02/12/2021)   [employer] of Occupational Health - Occupational Stress Questionnaire    Feeling of Stress : Not at all  Social Connections: Moderately  Integrated (02/12/2021)   Social Connection and Isolation Panel [NHANES]    Frequency of Communication with Friends and Family: More than three times a week    Frequency of Social Gatherings with Friends and Family: Once a week    Attends Religious Services: More than 4 times per year    Active Member of [employer] or Organizations: No    Attends Banker Meetings: Never    Marital Status: Married  Catering manager Violence: Not At Risk (02/12/2021)   Humiliation, Afraid, Rape, and Kick questionnaire    Fear of Current or Ex-Partner: No    Emotionally Abused: No    Physically Abused: No    Sexually Abused: No     PHYSICAL  EXAM  GENERAL EXAM/CONSTITUTIONAL: Vitals:  Vitals:   11/21/22 0848  BP: 138/69  Pulse: 70  Weight: 166 lb (75.3 kg)  Height: 5\' 1"  (1.549 m)   Patient is in no distress; well developed, nourished and groomed; neck is supple  CARDIOVASCULAR: Examination of carotid arteries is normal; no carotid bruits Regular rate and rhythm, no murmurs Examination of peripheral vascular system by observation and palpation is normal  EYES: Ophthalmoscopic exam of optic discs and posterior segments is normal; no papilledema or hemorrhages  MUSCULOSKELETAL: Gait, strength, tone, movements noted in Neurologic exam below  NEUROLOGIC: MENTAL STATUS:     11/21/2022    8:57 AM 12/13/2017    2:08 PM 06/13/2017    2:19 PM  MMSE - Mini Mental State Exam  Orientation to time 4 4 4   Orientation to Place 5 4 4   Registration 3 3 3   Attention/ Calculation 5 3 2   Recall 2 2 1   Language- name 2 objects 2 2 2   Language- repeat 1 0 0  Language- follow 3 step command 3 3 3   Language- read & follow direction 1 1 1   Write a sentence 1 1 1   Copy design 0 0 0  Total score 27 23 21    awake, alert, oriented to person, place and time recent and remote memory intact normal attention and concentration language fluent, comprehension intact, naming intact,  fund of knowledge  appropriate  CRANIAL NERVE:  2nd - no papilledema on fundoscopic exam 2nd, 3rd, 4th, 6th - pupils equal and reactive to light, visual fields full to confrontation, extraocular muscles intact, no nystagmus 5th - facial sensation symmetric 7th - facial strength symmetric 8th - hearing intact 9th - palate elevates symmetrically, uvula midline 11th - shoulder shrug symmetric 12th - tongue protrusion midline  MOTOR:  BRADYKINESIA IN BUE normal bulk and tone, full strength in the BUE, BLE  SENSORY:  normal and symmetric to light touch, temperature, vibration  COORDINATION:  finger-nose-finger, fine finger movements SLOW  REFLEXES:  deep tendon reflexes TRACE and symmetric  GAIT/STATION:  narrow based gait; STOOPED POSTURE; SLIGHTLY CAUTIOUS; good arm swing     DIAGNOSTIC DATA (LABS, IMAGING, TESTING) - I reviewed patient records, labs, notes, testing and imaging myself where available.  Lab Results  Component Value Date   WBC 7.1 08/08/2022   HGB 12.8 08/08/2022   HCT 39.2 08/08/2022   MCV 85.4 08/08/2022   PLT 263.0 08/08/2022      Component Value Date/Time   NA 141 08/08/2022 1004   NA 140 06/25/2013 0846   NA 138 11/19/2012 1251   K 3.7 08/08/2022 1004   K 3.8 11/19/2012 1251   CL 104 08/08/2022 1004   CL 108 (H) 04/23/2012 1302   CO2 27 08/08/2022 1004   CO2 27 11/19/2012 1251   GLUCOSE 81 08/08/2022 1004   GLUCOSE 80 11/19/2012 1251   GLUCOSE 88 04/23/2012 1302   BUN 18 08/08/2022 1004   BUN 14 06/25/2013 0846   BUN 13.7 11/19/2012 1251   CREATININE 1.10 08/08/2022 1004   CREATININE 1.10 (H) 09/25/2019 1057   CREATININE 0.9 11/19/2012 1251   CALCIUM 9.4 08/08/2022 1004   CALCIUM 9.2 11/19/2012 1251   PROT 7.4 01/12/2022 1144   PROT 7.4 06/25/2013 0846   PROT 7.7 11/19/2012 1251   ALBUMIN 3.9 01/12/2022 1144   ALBUMIN 4.3 06/25/2013 0846   ALBUMIN 3.8 11/19/2012 1251   AST 23 01/12/2022 1144   AST 23 11/19/2012 1251   ALT 33  01/12/2022 1144   ALT  17 11/19/2012 1251   ALKPHOS 77 01/12/2022 1144   ALKPHOS 73 11/19/2012 1251   BILITOT 0.4 01/12/2022 1144   BILITOT 0.33 11/19/2012 1251   GFRNONAA >60 06/25/2017 0605   GFRNONAA 82 03/02/2015 1145   GFRAA >60 06/25/2017 0605   GFRAA >89 03/02/2015 1145   Lab Results  Component Value Date   CHOL 164 08/08/2022   HDL 49.80 08/08/2022   LDLCALC 99 08/08/2022   TRIG 74.0 08/08/2022   CHOLHDL 3 08/08/2022   Lab Results  Component Value Date   HGBA1C 6.2 08/08/2022   Lab Results  Component Value Date   VITAMINB12 3,853 (H) 06/24/2017   Lab Results  Component Value Date   TSH 1.21 03/29/2021    10/17/13 CT HEAD [I reviewed images myself and agree with interpretation. -VRP]  1. Ventricular dilatation is again noted, slightly disproportionate to the degree of diffuse cortical atrophy. Is there any clinical suspicion of normal pressure hydrocephalus?  2. Atrophy and moderate small vessel ischemic change.  05/03/17 MRI brain [report only] 1.  Moderate ventricular dilatation out of proportion to the degree of cerebral atrophy. This has been described on previous CTs of the head. This likely represents central atrophy but normal pressure hydrocephalus is not excluded. Please correlate for signs and symptoms of normal pressure hydrocephalus. 2.   Chronic microvascular ischemic changes in the periventricular white matter.  3.  No acute intracranial abnormality.    ASSESSMENT AND PLAN  72 y.o. year old female here with sudden positional vertigo on 10/15/13, then improved. Memory loss, gait diff and confusion since 2017.   Dx:  1. Gait abnormality   2. Memory loss       PLAN:  MEMORY LOSS (likely MCI vs normal aging; MMSE 27/30, improved; do not suspect NPH) - try to stay active physically and get some exercise (at least 15-30 minutes per day) - eat a nutritious diet with lean protein, plants / vegetables, whole grains; avoid ultra-processed foods - increase social  activities, brain stimulation, games, puzzles, hobbies, crafts, arts, music; try new activities; keep it fun! - aim for at least 7-8 hours sleep per night (or more) - avoid smoking and alcohol - caution with medications, finances, driving  GAIT DIFFICULTY (stooped posture) - consider MRI lumbar spine - continue PT exercises; follow up exercise program at [facility] - fall precautions reviewed; consider cane / walker  Return for return to PCP, pending if symptoms worsen or fail to improve.    Suanne Marker, MD 11/21/2022, 9:57 AM Certified in Neurology, Neurophysiology and Neuroimaging  [facility] [address], [address] [place], [place] [address] [phone]

## 2022-11-21 NOTE — Patient Instructions (Signed)
  MEMORY LOSS (likely MCI vs normal aging; MMSE 27/30, improved) - try to stay active physically and get some exercise (at least 15-30 minutes per day) - eat a nutritious diet with lean protein, plants / vegetables, whole grains; avoid ultra-processed foods - increase social activities, brain stimulation, games, puzzles, hobbies, crafts, arts, music; try new activities; keep it fun! - aim for at least 7-8 hours sleep per night (or more) - avoid smoking and alcohol - caution with medications, finances, driving  GAIT DIFFICULTY (stooped posture) - consider MRI lumbar spine - continue PT exercises; follow up exercise program at [facility] - fall precautions reviewed; consider cane / walker

## 2022-11-22 ENCOUNTER — Encounter (HOSPITAL_BASED_OUTPATIENT_CLINIC_OR_DEPARTMENT_OTHER): Payer: Medicare HMO

## 2022-11-24 DIAGNOSIS — M810 Age-related osteoporosis without current pathological fracture: Secondary | ICD-10-CM | POA: Diagnosis not present

## 2022-11-29 ENCOUNTER — Encounter (HOSPITAL_BASED_OUTPATIENT_CLINIC_OR_DEPARTMENT_OTHER): Payer: Medicare HMO | Admitting: Physical Therapy

## 2022-12-06 ENCOUNTER — Encounter (HOSPITAL_BASED_OUTPATIENT_CLINIC_OR_DEPARTMENT_OTHER): Payer: Medicare HMO | Admitting: Physical Therapy

## 2023-01-09 ENCOUNTER — Other Ambulatory Visit: Payer: Self-pay | Admitting: Family Medicine

## 2023-01-09 DIAGNOSIS — E782 Mixed hyperlipidemia: Secondary | ICD-10-CM

## 2023-01-20 ENCOUNTER — Other Ambulatory Visit: Payer: Self-pay

## 2023-01-20 DIAGNOSIS — I739 Peripheral vascular disease, unspecified: Secondary | ICD-10-CM

## 2023-01-24 ENCOUNTER — Telehealth: Payer: Self-pay | Admitting: Family Medicine

## 2023-01-24 NOTE — Telephone Encounter (Signed)
Copied from CRM [id number]. Topic: Medicare AWV >> Jan 24, 2023 11:37 AM Payton Doughty wrote: Reason for CRM: Called LVM 01/24/2023 to schedule AWV. Please schedule Virtual or Telehealth visits ONLY  Verlee Rossetti; Care Guide Ambulatory Clinical Support [place] [facility] Direct Dial: [phone]

## 2023-01-30 NOTE — Progress Notes (Unsigned)
[facility] [place]  ASSESSMENT / PLAN: [name] is a 73 y.o. female with atherosclerosis of native arteries of right lower extremity causing intermittent claudication. This seems to be improving with medical therapy.  Patient counseled patients with asymptomatic peripheral arterial disease or claudication have a 1-2% risk of developing chronic limb threatening ischemia, but a 15-30% risk of mortality in the next 5 years. Intervention should only be considered for medically optimized patients with disabling symptoms.   Recommend the following which can slow the progression of atherosclerosis and reduce the risk of major adverse cardiac / limb events:  Complete cessation from all tobacco products. Blood glucose control with goal A1c < 7%. Blood pressure control with goal blood pressure < 140/90 mmHg. Lipid reduction therapy with goal LDL-C <100 mg/dL (<96 if symptomatic from PAD).  Aspirin 81mg  PO QD.  Atorvastatin 40-80mg  PO QD (or other "high intensity" statin therapy). Daily walking to and past the point of discomfort.   Follow-up with me in 12 months with repeat ABI.  CHIEF COMPLAINT: Cramping discomfort in right leg  HISTORY OF PRESENT ILLNESS: Heidi [name] is a 73 y.o. female who presents to clinic for evaluation of cramping discomfort in her right calf with walking.  The patient reports she is fairly active despite this discomfort.  She can get through a grocery shop or walk about a block before her symptoms become bothersome to her.  She does not describe symptoms of ischemic rest pain.  She does not have any ulceration about her feet.  01/04/21: Returns for follow up. Doing much better. Walking distance has improved. No new limb threatening symptoms.   VASCULAR SURGICAL HISTORY: none  VASCULAR RISK FACTORS: Negative history of stroke / transient ischemic attack. Negative history of coronary artery disease.  Positive history of diabetes mellitus.  Last A1c 6.2. Negative history of smoking.  Negative history of hypertension.  Negative history of chronic kidney disease.   Negative history of chronic obstructive pulmonary disease.  FUNCTIONAL STATUS: ECOG performance status: (1) Restricted in physically strenuous activity, ambulatory and able to do work of light nature Ambulatory status: Ambulatory within the community with limits  Past Medical History:  Diagnosis Date   Allergic rhinitis due to pollen    Allergy    Anemia, unspecified    Blood transfusion without reported diagnosis    1979   Disorder of bone and cartilage, unspecified    Eosinophilia    Headache 09/24/2016   Lymphocytosis (symptomatic)    Osteoporosis    Other abnormal blood chemistry    Other and unspecified hyperlipidemia    Pain in joint, shoulder region    Reflux esophagitis    Type II or unspecified type diabetes mellitus without mention of complication, uncontrolled    Type II or unspecified type diabetes mellitus without mention of complication, uncontrolled    Umbilical hernia without mention of obstruction or gangrene    Unspecified disorder of liver     Past Surgical History:  Procedure Laterality Date   CATARACT EXTRACTION, BILATERAL     CESAREAN SECTION     COLONOSCOPY  2015   Dr. Arlyce Dice, normal exam    Family History  Problem Relation Age of Onset   Pancreatic cancer Neg Hx    Colon cancer Neg Hx    Esophageal cancer Neg Hx    Stomach cancer Neg Hx     Social History   Socioeconomic History   Marital status: Married    Spouse name: Eleanora Neighbor  Number of children: 4   Years of education: BSN   Highest education level: Not on file  Occupational History   Occupation: Teacher, adult education: [employer]  Tobacco Use   Smoking status: Never   Smokeless tobacco: Never  Substance and Sexual Activity   Alcohol use: No   Drug use: No   Sexual activity: Not on file  Other Topics Concern   Not on file  Social History  Narrative   Married   Never smoked   Alcohol none   Exercise walk 2-3 times week   Children 4   BS Nursing , retired         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 Corporate investment banker Strain: Low Risk  (02/12/2021)   Overall Financial Resource Strain (CARDIA)    Difficulty of Paying Living Expenses: Not hard at all  Food Insecurity: No Food Insecurity (02/12/2021)   Hunger Vital Sign    Worried About Running Out of Food in the Last Year: Never true    Ran Out of Food in the Last Year: Never true  Transportation Needs: No Transportation Needs (02/12/2021)   PRAPARE - Administrator, Civil Service (Medical): No    Lack of Transportation (Non-Medical): No  Physical Activity: Unknown (02/12/2021)   Exercise Vital Sign    Days of Exercise per Week: Not on file    Minutes of Exercise per Session: 30 min  Stress: No Stress Concern Present (02/12/2021)   [employer] of Occupational Health - Occupational Stress Questionnaire    Feeling of Stress : Not at all  Social Connections: Moderately Integrated (02/12/2021)   Social Connection and Isolation Panel [NHANES]    Frequency of Communication with Friends and Family: More than three times a week    Frequency of Social Gatherings with Friends and Family: Once a week    Attends Religious Services: More than 4 times per year    Active Member of [employer] or Organizations: No    Attends Banker Meetings: Never    Marital Status: Married  Catering manager Violence: Not At Risk (02/12/2021)   Humiliation, Afraid, Rape, and Kick questionnaire    Fear of Current or Ex-Partner: No    Emotionally Abused: No    Physically Abused: No    Sexually Abused: No    Allergies  Allergen Reactions   Ciprofloxacin Anaphylaxis    Patient states she gets "CDIFF" Family states this is not correct and this is not an allergy   Alendronate Sodium     Other Reaction(s): Unknown    Current Outpatient Medications  Medication Sig Dispense Refill    calcium-vitamin D (OSCAL WITH D) 500-200 MG-UNIT per tablet Take 1 tablet by mouth daily.     CEQUA 0.09 % SOLN Apply 1 drop to eye 2 (two) times daily.     cetirizine (ZYRTEC) 10 MG chewable tablet Chew 10 mg by mouth daily.     hydrocortisone (CORTEF) 10 MG tablet Take 1 tablet (10 mg total) by mouth 2 (two) times daily. (Patient taking differently: Take 10 mg by mouth 2 (two) times daily. One tablet (10 mg) in morning and one half (5mg ) at night) 30 tablet 0   Multiple Vitamin (MULTIVITAMIN) tablet Take 1 tablet by mouth daily.     simvastatin (ZOCOR) 20 MG tablet Take 1 tablet (20 mg total) by mouth at bedtime. 90 tablet 0   No current facility-administered medications for this visit.    PHYSICAL  EXAM There were no vitals filed for this visit.  Well-appearing elderly woman in no acute distress Regular rate and rhythm Unlabored breathing No palpable pedal pulses in the right lower extremity.    PERTINENT LABORATORY AND RADIOLOGIC DATA  Most recent CBC    Latest Ref Rng & Units 08/08/2022   10:04 AM 01/12/2022   11:44 AM 09/09/2021    1:47 PM  CBC  WBC 4.0 - 10.5 K/uL 7.1  7.3  8.4   Hemoglobin 12.0 - 15.0 g/dL 40.9  81.1  91.4   Hematocrit 36.0 - 46.0 % 39.2  38.6  39.0   Platelets 150.0 - 400.0 K/uL 263.0  283.0  248.0      Most recent CMP    Latest Ref Rng & Units 08/08/2022   10:04 AM 01/12/2022   11:44 AM 09/09/2021    1:47 PM  CMP  Glucose 70 - 99 mg/dL 81  782  85   BUN 6 - 23 mg/dL 18  11  14    Creatinine 0.40 - 1.20 mg/dL 9.56  2.13  0.86   Sodium 135 - 145 mEq/L 141  141  140   Potassium 3.5 - 5.1 mEq/L 3.7  3.8  4.8   Chloride 96 - 112 mEq/L 104  105  104   CO2 19 - 32 mEq/L 27  30  29    Calcium 8.4 - 10.5 mg/dL 9.4  9.0  9.6   Total Protein 6.0 - 8.3 g/dL  7.4  7.8   Total Bilirubin 0.2 - 1.2 mg/dL  0.4  0.3   Alkaline Phos 39 - 117 U/L  77  79   AST 0 - 37 U/L  23  20   ALT 0 - 35 U/L  33  25     Renal function CrCl cannot be calculated (Patient's most  recent lab result is older than the maximum 21 days allowed.).  Hgb A1c MFr Bld (%)  Date Value  08/08/2022 6.2    LDL Calculated  Date Value Ref Range Status  06/25/2013 145 (H) 0 - 99 mg/dL Final   LDL Cholesterol  Date Value Ref Range Status  08/08/2022 99 0 - 99 mg/dL Final   +-------+-----------+-----------+------------+------------+  ABI/TBIToday's ABIToday's TBIPrevious ABIPrevious TBI  +-------+-----------+-----------+------------+------------+  Right 0.78       0.56       0.72        0.51          +-------+-----------+-----------+------------+------------+  Left  0.99       0.73       1.00        0.65          +-------+-----------+-----------+------------+------------+    Rande Brunt. Lenell Antu, MD [facility] Phone Number: [phone] 01/30/2023 10:01 AM  Total time spent on preparing this encounter including chart review, data review, collecting history, examining the patient, coordinating care for this new patient, 60 minutes.  Portions of this report may have been transcribed using voice recognition software.  Every effort has been made to ensure accuracy; however, inadvertent computerized transcription errors may still be present.

## 2023-01-31 ENCOUNTER — Encounter: Payer: Self-pay | Admitting: Vascular Surgery

## 2023-01-31 ENCOUNTER — Ambulatory Visit (INDEPENDENT_AMBULATORY_CARE_PROVIDER_SITE_OTHER): Payer: Medicare HMO | Admitting: Vascular Surgery

## 2023-01-31 ENCOUNTER — Ambulatory Visit (HOSPITAL_COMMUNITY)
Admission: RE | Admit: 2023-01-31 | Discharge: 2023-01-31 | Disposition: A | Discharge status: Home / Self Care | Payer: Medicare HMO | Source: Ambulatory Visit | Attending: Vascular Surgery | Admitting: Vascular Surgery

## 2023-01-31 VITALS — BP 124/76 | Temp 97.8°F | Resp 16 | Ht 61.0 in | Wt 166.3 lb

## 2023-01-31 DIAGNOSIS — I739 Peripheral vascular disease, unspecified: Secondary | ICD-10-CM | POA: Diagnosis not present

## 2023-01-31 DIAGNOSIS — I70211 Atherosclerosis of native arteries of extremities with intermittent claudication, right leg: Secondary | ICD-10-CM | POA: Diagnosis not present

## 2023-01-31 LAB — VAS US ABI WITH/WO TBI
Left ABI: 1.05
Right ABI: 0.93

## 2023-02-16 ENCOUNTER — Other Ambulatory Visit: Payer: Self-pay

## 2023-02-16 DIAGNOSIS — I739 Peripheral vascular disease, unspecified: Secondary | ICD-10-CM

## 2023-02-21 ENCOUNTER — Ambulatory Visit (INDEPENDENT_AMBULATORY_CARE_PROVIDER_SITE_OTHER): Payer: Medicare HMO

## 2023-02-21 VITALS — Ht 61.0 in | Wt 166.0 lb

## 2023-02-21 DIAGNOSIS — Z0000 Encounter for general adult medical examination without abnormal findings: Secondary | ICD-10-CM | POA: Diagnosis not present

## 2023-02-21 DIAGNOSIS — Z1211 Encounter for screening for malignant neoplasm of colon: Secondary | ICD-10-CM

## 2023-02-21 NOTE — Progress Notes (Signed)
Subjective:   Heidi [name] is a 73 y.o. female who presents for Medicare Annual (Subsequent) preventive examination.  Visit Complete: Virtual I connected with  Heidi [name] on 02/21/23 by a audio enabled telemedicine application and verified that I am speaking with the correct person using two identifiers.  Patient Location: Home  Provider Location: Home Office  I discussed the limitations of evaluation and management by telemedicine. The patient expressed understanding and agreed to proceed.  Vital Signs: Because this visit was a virtual/telehealth visit, some criteria may be missing or patient reported. Any vitals not documented were not able to be obtained and vitals that have been documented are patient reported.      Objective:    Today's Vitals   02/21/23 1313  Weight: 166 lb (75.3 kg)  Height: 5\' 1"  (1.549 m)   Body mass index is 31.37 kg/m.     02/21/2023    1:19 PM 09/19/2022    3:26 PM 02/12/2021   11:13 AM 02/19/2020    8:02 PM 06/23/2017   11:00 PM 06/23/2017    7:47 PM 09/24/2016   11:16 AM  Advanced Directives  Does Patient Have a Medical Advance Directive? Yes Yes Yes No Yes Yes Yes  Type of Estate agent of [place];Living will Healthcare Power of [place];Living will Healthcare Power of [place];Living will  Living will;Healthcare Power of [employer] Power of [place];Living will Healthcare Power of Attorney  Does patient want to make changes to medical advance directive?     No - Patient declined    Copy of Healthcare Power of Attorney in Chart? No - copy requested  No - copy requested  No - copy requested      Current Medications (verified) Outpatient Encounter Medications as of 02/21/2023  Medication Sig   calcium-vitamin D (OSCAL WITH D) 500-200 MG-UNIT per tablet Take 1 tablet by mouth daily.   CEQUA 0.09 % SOLN Apply 1 drop to eye 2 (two) times daily.   cetirizine (ZYRTEC) 10 MG chewable tablet Chew 10 mg by mouth  daily.   hydrocortisone (CORTEF) 10 MG tablet Take 1 tablet (10 mg total) by mouth 2 (two) times daily. (Patient taking differently: Take 10 mg by mouth 2 (two) times daily. One tablet (10 mg) in morning and one half (5mg ) at night)   Multiple Vitamin (MULTIVITAMIN) tablet Take 1 tablet by mouth daily.   simvastatin (ZOCOR) 20 MG tablet Take 1 tablet (20 mg total) by mouth at bedtime.   No facility-administered encounter medications on file as of 02/21/2023.    Allergies (verified) Ciprofloxacin and Alendronate sodium   History: Past Medical History:  Diagnosis Date   Allergic rhinitis due to pollen    Allergy    Anemia, unspecified    Blood transfusion without reported diagnosis    1979   Disorder of bone and cartilage, unspecified    Eosinophilia    Headache 09/24/2016   Lymphocytosis (symptomatic)    Osteoporosis    Other abnormal blood chemistry    Other and unspecified hyperlipidemia    Pain in joint, shoulder region    Reflux esophagitis    Type II or unspecified type diabetes mellitus without mention of complication, uncontrolled    Type II or unspecified type diabetes mellitus without mention of complication, uncontrolled    Umbilical hernia without mention of obstruction or gangrene    Unspecified disorder of liver    Past Surgical History:  Procedure Laterality Date   CATARACT EXTRACTION, BILATERAL  CESAREAN SECTION     COLONOSCOPY  2015   Dr. Arlyce Dice, normal exam   Family History  Problem Relation Age of Onset   Pancreatic cancer Neg Hx    Colon cancer Neg Hx    Esophageal cancer Neg Hx    Stomach cancer Neg Hx    Social History   Socioeconomic History   Marital status: Married    Spouse name: Bampia   Number of children: 4   Years of education: BSN   Highest education level: Not on file  Occupational History   Occupation: Teacher, adult education: [employer]  Tobacco Use   Smoking status: Never   Smokeless tobacco: Never  Substance and  Sexual Activity   Alcohol use: No   Drug use: No   Sexual activity: Not on file  Other Topics Concern   Not on file  Social History Narrative   Married   Never smoked   Alcohol none   Exercise walk 2-3 times week   Children 4   BS Nursing , retired         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 [employer] Strain: Low Risk  (02/21/2023)   Overall Financial Resource Strain (CARDIA)    Difficulty of Paying Living Expenses: Not hard at all  Food Insecurity: No Food Insecurity (02/21/2023)   Hunger Vital Sign    Worried About Running Out of Food in the Last Year: Never true    Ran Out of Food in the Last Year: Never true  Transportation Needs: No Transportation Needs (02/21/2023)   PRAPARE - Administrator, Civil Service (Medical): No    Lack of Transportation (Non-Medical): No  Physical Activity: Insufficiently Active (02/21/2023)   Exercise Vital Sign    Days of Exercise per Week: 3 days    Minutes of Exercise per Session: 30 min  Stress: No Stress Concern Present (02/21/2023)   [employer] of Occupational Health - Occupational Stress Questionnaire    Feeling of Stress : Not at all  Social Connections: Socially Integrated (02/21/2023)   Social Connection and Isolation Panel [NHANES]    Frequency of Communication with Friends and Family: More than three times a week    Frequency of Social Gatherings with Friends and Family: More than three times a week    Attends Religious Services: More than 4 times per year    Active Member of [employer] or Organizations: Yes    Attends Engineer, structural: More than 4 times per year    Marital Status: Married    Tobacco Counseling Counseling given: Not Answered   Clinical Intake:  Pre-visit preparation completed: Yes  Pain : No/denies pain     BMI - recorded: 31.37 Nutritional Status: BMI > 30  Obese Nutritional Risks: None Diabetes: Yes CBG done?: No Did pt. bring in CBG monitor from home?: No  How often do  you need to have someone help you when you read instructions, pamphlets, or other written materials from your doctor or pharmacy?: 1 - Never  Interpreter Needed?: No  Information entered by :: Theresa Mulligan LPN   Activities of Daily Living    02/21/2023    1:18 PM  In your present state of health, do you have any difficulty performing the following activities:  Hearing? 0  Vision? 0  Difficulty concentrating or making decisions? 0  Walking or climbing stairs? 0  Dressing or bathing? 0  Doing errands, shopping? 0  Preparing Food and  eating ? N  Using the Toilet? N  In the past six months, have you accidently leaked urine? N  Do you have problems with loss of bowel control? N  Managing your Medications? N  Managing your Finances? N  Housekeeping or managing your Housekeeping? N    Patient Care Team: Copland, Gwenlyn Found, MD as PCP - General (Family Medicine) Mateo Flow, MD as Consulting Physician (Ophthalmology)  Indicate any recent Medical Services you may have received from other than [facility] providers in the past year (date may be approximate).     Assessment:   This is a routine wellness examination for [place].  Hearing/Vision screen Hearing Screening - Comments:: Denies hearing difficulties   Vision Screening - Comments:: Wears rx glasses - up to date with routine eye exams with  Dr Elmer Picker   Goals Addressed               This Visit's Progress     Lose weight (pt-stated)         Depression Screen    02/21/2023    1:18 PM 02/28/2022    9:40 AM 09/09/2021    1:21 PM 03/29/2021    1:11 PM 02/12/2021   11:16 AM 03/02/2020   12:39 PM 03/21/2016   10:40 AM  PHQ 2/9 Scores  PHQ - 2 Score 0 0 0 0 0 0 0  PHQ- 9 Score  0  0  0   Exception Documentation       Patient refusal    Fall Risk    02/21/2023    1:19 PM 08/19/2022    2:39 PM 02/28/2022    9:40 AM 09/09/2021    1:21 PM 03/29/2021    1:04 PM  Fall Risk   Falls in the past year? 0 0 0 0 0  Number falls in  past yr: 0 0 0 0 0  Injury with Fall? 0 0 0 0 0  Risk for fall due to : No Fall Risks  No Fall Risks    Follow up Falls prevention discussed;Falls evaluation completed   Falls evaluation completed     MEDICARE RISK AT HOME: Medicare Risk at Home Any stairs in or around the home?: Yes If so, are there any without handrails?: No Home free of loose throw rugs in walkways, pet beds, electrical cords, etc?: Yes Adequate lighting in your home to reduce risk of falls?: Yes Life alert?: No Use of a cane, walker or w/c?: No Grab bars in the bathroom?: No Shower chair or bench in shower?: No Elevated toilet seat or a handicapped toilet?: No  TIMED UP AND GO:  Was the test performed?  No    Cognitive Function:    11/21/2022    8:57 AM 12/13/2017    2:08 PM 06/13/2017    2:19 PM  MMSE - Mini Mental State Exam  Orientation to time 4 4 4   Orientation to Place 5 4 4   Registration 3 3 3   Attention/ Calculation 5 3 2   Recall 2 2 1   Language- name 2 objects 2 2 2   Language- repeat 1 0 0  Language- follow 3 step command 3 3 3   Language- read & follow direction 1 1 1   Write a sentence 1 1 1   Copy design 0 0 0  Total score 27 23 21         02/21/2023    1:19 PM 02/12/2021   11:22 AM  6CIT Screen  What Year? 0 points 0 points  What month? 0 points 3 points  What time? 0 points 0 points  Count back from 20 0 points 0 points  Months in reverse 0 points 0 points  Repeat phrase 0 points 10 points  Total Score 0 points 13 points    Immunizations Immunization History  Administered Date(s) Administered   Fluad Quad(high Dose 65+) 10/03/2019, 09/09/2021   Influenza Whole 10/08/2012   Influenza,inj,Quad PF,6+ Mos 09/28/2015   Influenza-Unspecified 11/03/2020   PFIZER(Purple Top)SARS-COV-2 Vaccination 02/11/2019, 03/08/2019, 09/14/2020   Pfizer Covid-19 Vaccine Bivalent Booster 70yrs & up 11/03/2020   Pneumococcal Conjugate-13 12/31/2012   Pneumococcal Polysaccharide-23 09/07/2016    Td 01/04/2007   Tdap 08/08/2022   Zoster Recombinant(Shingrix) 09/18/2017, 11/27/2017   Zoster, Live 07/01/2015    TDAP status: Up to date  Flu Vaccine status: Up to date  Pneumococcal vaccine status: Up to date  Covid-19 vaccine status: Declined, Education has been provided regarding the importance of this vaccine but patient still declined. Advised may receive this vaccine at local pharmacy or Health Dept.or vaccine clinic. Aware to provide a copy of the vaccination record if obtained from local pharmacy or Health Dept. Verbalized acceptance and understanding.  Qualifies for Shingles Vaccine? Yes   Zostavax completed Yes   Shingrix Completed?: Yes  Screening Tests Health Maintenance  Topic Date Due   OPHTHALMOLOGY EXAM  11/19/2021   COVID-19 Vaccine (4 - 2024-25 season) 09/04/2022   Colonoscopy  02/07/2023   HEMOGLOBIN A1C  02/08/2023   Diabetic kidney evaluation - eGFR measurement  08/08/2023   Diabetic kidney evaluation - Urine ACR  08/08/2023   FOOT EXAM  08/08/2023   Medicare Annual Wellness (AWV)  02/21/2024   MAMMOGRAM  08/15/2024   DTaP/Tdap/Td (3 - Td or Tdap) 08/07/2032   Pneumonia Vaccine 33+ Years old  Completed   INFLUENZA VACCINE  Completed   DEXA SCAN  Completed   Hepatitis C Screening  Completed   Zoster Vaccines- Shingrix  Completed   HPV VACCINES  Aged Out    Health Maintenance  Health Maintenance Due  Topic Date Due   OPHTHALMOLOGY EXAM  11/19/2021   COVID-19 Vaccine (4 - 2024-25 season) 09/04/2022   Colonoscopy  02/07/2023   HEMOGLOBIN A1C  02/08/2023    Colorectal cancer screening: Referral to GI placed 02/21/23. Pt aware the office will call re: appt.  Mammogram status: Completed 08/16/22. Repeat every year  Bone Density status: Completed 08/23/22. Results reflect: Bone density results: OSTEOPENIA. Repeat every   years.     Additional Screening:  Hepatitis C Screening: does qualify; Completed 09/10/14  Vision Screening: Recommended  annual ophthalmology exams for early detection of glaucoma and other disorders of the eye. Is the patient up to date with their annual eye exam?  Yes  Who is the provider or what is the name of the office in which the patient attends annual eye exams? Dr Elmer Picker If pt is not established with a provider, would they like to be referred to a provider to establish care? You have been referred to [facility] for a complete eye exam. If you haven't heard from them in a few days, please call them to schedule your appointment.  [facility] [address] [place] [address] Phone: [phone] .   Dental Screening: Recommended annual dental exams for proper oral hygiene  Diabetic Foot Exam: Diabetic Foot Exam: Completed 08/08/22  Community Resource Referral / Chronic Care Management:  CRR required this visit?  No   CCM required  this visit?  No     Plan:     I have personally reviewed and noted the following in the patient's chart:   Medical and social history Use of alcohol, tobacco or illicit drugs  Current medications and supplements including opioid prescriptions. Patient is not currently taking opioid prescriptions. Functional ability and status Nutritional status Physical activity Advanced directives List of other physicians Hospitalizations, surgeries, and ER visits in previous 12 months Vitals Screenings to include cognitive, depression, and falls Referrals and appointments  In addition, I have reviewed and discussed with patient certain preventive protocols, quality metrics, and best practice recommendations. A written personalized care plan for preventive services as well as general preventive health recommendations were provided to patient.     Tillie Rung, LPN   1/61/0960   After Visit Summary: (MyChart) Due to this being a telephonic visit, the after visit summary with patients personalized plan was offered to patient via MyChart   Nurse Notes:  None

## 2023-02-21 NOTE — Patient Instructions (Addendum)
Heidi [name] , Thank you for taking time to come for your Medicare Wellness Visit. I appreciate your ongoing commitment to your health goals. Please review the following plan we discussed and let me know if I can assist you in the future.   Referrals/Orders/Follow-Ups/Clinician Recommendations: You have been referred to [facility] for a complete eye exam. If you haven't heard from them in a few days, please call them to schedule your appointment.  [facility] [address] [place] [address] Phone: [phone]   This is a list of the screening recommended for you and due dates:  Health Maintenance  Topic Date Due   Eye exam for diabetics  11/19/2021   COVID-19 Vaccine (4 - 2024-25 season) 09/04/2022   Colon Cancer Screening  02/07/2023   Hemoglobin A1C  02/08/2023   Yearly kidney function blood test for diabetes  08/08/2023   Yearly kidney health urinalysis for diabetes  08/08/2023   Complete foot exam   08/08/2023   Medicare Annual Wellness Visit  02/21/2024   Mammogram  08/15/2024   DTaP/Tdap/Td vaccine (3 - Td or Tdap) 08/07/2032   Pneumonia Vaccine  Completed   Flu Shot  Completed   DEXA scan (bone density measurement)  Completed   Hepatitis C Screening  Completed   Zoster (Shingles) Vaccine  Completed   HPV Vaccine  Aged Out    Advanced directives: (Copy Requested) Please bring a copy of your health care power of attorney and living will to the office to be added to your chart at your convenience.  Next Medicare Annual Wellness Visit scheduled for next year: Yes

## 2023-03-28 ENCOUNTER — Other Ambulatory Visit: Payer: Self-pay | Admitting: Family Medicine

## 2023-03-28 DIAGNOSIS — E782 Mixed hyperlipidemia: Secondary | ICD-10-CM

## 2023-04-06 ENCOUNTER — Other Ambulatory Visit: Payer: Self-pay

## 2023-04-06 DIAGNOSIS — E782 Mixed hyperlipidemia: Secondary | ICD-10-CM

## 2023-04-06 MED ORDER — SIMVASTATIN 20 MG PO TABS: 20.0000 mg | ORAL_TABLET | Freq: Every day | ORAL | 0 refills | Status: DC

## 2023-05-09 NOTE — Progress Notes (Unsigned)
 [facility] [address] [place], [place] [address] [phone]  Date:  05/10/2023   Name:  Heidi [name]   DOB:  09-28-1950   MRN:  [id number]  PCP:  Kaylee Partridge, MD    Chief Complaint: No chief complaint on file.   History of Present Illness:  Heidi [name] is a 73 y.o. very pleasant female patient who presents with the following:  Patient seen today for follow-up visit.  Most recent visit with myself was in August of last year History of diet-controlled diabetes, hyponatremia, anemia, adrenal insufficiency, memory loss, osteoporosis Her endocrinologist is Dr. Ronelle Coffee who treats her adrenal insufficiency  ?  When was most recent visit.  Most recent note I can see on chart is from October 2023 Vascular surgery is following her for atherosclerosis of right lower extremity causing intermittent claudication  Bone density updated last summer-osteopenia Eye exam Colon cancer screening appears to be due A1c can be updated Mammogram up-to-date She got her tetanus shot last year.  Shingrix is complete. Recommend RSV  Hydrocortisone  10 mg Simvastatin  20  Patient Active Problem List   Diagnosis Date Noted   Impingement syndrome of left shoulder 10/09/2019   Osteopenia 10/03/2019   Memory loss 06/23/2017   Gait abnormality 06/23/2017   Uterine prolapse 09/24/2016   Headache 09/24/2016   Hyponatremia 03/03/2015   Special screening for malignant neoplasms, colon 12/10/2012   Lymphocytosis (symptomatic)    Disorder of bone and cartilage    Type II or unspecified type diabetes mellitus without mention of complication, uncontrolled    Reflux esophagitis    Anemia, unspecified    Other and unspecified hyperlipidemia    Lymphocytosis 01/16/2012   Idiopathic eosinophilia 04/05/2011   NONSPECIFIC ABNORMAL RESULTS LIVR FUNCTION STUDY 08/15/2008   ANEMIA, HX OF 08/15/2008    Past Medical History:  Diagnosis  Date   Allergic rhinitis due to pollen    Allergy    Anemia, unspecified    Blood transfusion without reported diagnosis    1979   Disorder of bone and cartilage, unspecified    Eosinophilia    Headache 09/24/2016   Lymphocytosis (symptomatic)    Osteoporosis    Other abnormal blood chemistry    Other and unspecified hyperlipidemia    Pain in joint, shoulder region    Reflux esophagitis    Type II or unspecified type diabetes mellitus without mention of complication, uncontrolled    Type II or unspecified type diabetes mellitus without mention of complication, uncontrolled    Umbilical hernia without mention of obstruction or gangrene    Unspecified disorder of liver     Past Surgical History:  Procedure Laterality Date   CATARACT EXTRACTION, BILATERAL     CESAREAN SECTION     COLONOSCOPY  2015   Dr. Arvie Latus, normal exam    Social History   Tobacco Use   Smoking status: Never   Smokeless tobacco: Never  Substance Use Topics   Alcohol use: No   Drug use: No    Family History  Problem Relation Age of Onset   Pancreatic cancer Neg Hx    Colon cancer Neg Hx    Esophageal cancer Neg Hx    Stomach cancer Neg Hx     Allergies  Allergen Reactions   Ciprofloxacin Anaphylaxis    Patient states she gets "CDIFF" Family states this is not correct and this is not an allergy   Alendronate Sodium  Other Reaction(s): Unknown    Medication list has been reviewed and updated.  Current Outpatient Medications on File Prior to Visit  Medication Sig Dispense Refill   calcium -vitamin D  (OSCAL WITH D) 500-200 MG-UNIT per tablet Take 1 tablet by mouth daily.     CEQUA 0.09 % SOLN Apply 1 drop to eye 2 (two) times daily.     cetirizine (ZYRTEC) 10 MG chewable tablet Chew 10 mg by mouth daily.     hydrocortisone  (CORTEF ) 10 MG tablet Take 1 tablet (10 mg total) by mouth 2 (two) times daily. (Patient taking differently: Take 10 mg by mouth 2 (two) times daily. One tablet (10 mg) in  morning and one half (5mg ) at night) 30 tablet 0   Multiple Vitamin (MULTIVITAMIN) tablet Take 1 tablet by mouth daily.     simvastatin  (ZOCOR ) 20 MG tablet Take 1 tablet (20 mg total) by mouth at bedtime. 90 tablet 0   No current facility-administered medications on file prior to visit.    Review of Systems:  As per HPI- otherwise negative.   Physical Examination: There were no vitals filed for this visit. There were no vitals filed for this visit. There is no height or weight on file to calculate BMI. Ideal Body Weight:    GEN: no acute distress. HEENT: Atraumatic, Normocephalic.  Ears and Nose: No external deformity. CV: RRR, No M/G/R. No JVD. No thrill. No extra heart sounds. PULM: CTA B, no wheezes, crackles, rhonchi. No retractions. No resp. distress. No accessory muscle use. ABD: S, NT, ND, +BS. No rebound. No HSM. EXTR: No c/c/e PSYCH: Normally interactive. Conversant.    Assessment and Plan: ***  Signed Gates Kasal, MD

## 2023-05-09 NOTE — Patient Instructions (Incomplete)
 It was great to see you again today Recommend dose of RSV vaccine at your pharmacy I will be in touch with your labs  We will get you seen by physical therapy and I will reach out to Gastro to set  up your colonoscopy Please see me in about 6 months and take care!

## 2023-05-10 ENCOUNTER — Encounter: Payer: Self-pay | Admitting: Family Medicine

## 2023-05-10 ENCOUNTER — Ambulatory Visit (INDEPENDENT_AMBULATORY_CARE_PROVIDER_SITE_OTHER): Admitting: Family Medicine

## 2023-05-10 ENCOUNTER — Other Ambulatory Visit (HOSPITAL_COMMUNITY): Payer: Self-pay

## 2023-05-10 ENCOUNTER — Telehealth: Payer: Self-pay

## 2023-05-10 VITALS — BP 134/76 | HR 74 | Ht 61.0 in | Wt 164.0 lb

## 2023-05-10 DIAGNOSIS — Z1329 Encounter for screening for other suspected endocrine disorder: Secondary | ICD-10-CM

## 2023-05-10 DIAGNOSIS — E119 Type 2 diabetes mellitus without complications: Secondary | ICD-10-CM

## 2023-05-10 DIAGNOSIS — E785 Hyperlipidemia, unspecified: Secondary | ICD-10-CM | POA: Diagnosis not present

## 2023-05-10 DIAGNOSIS — D649 Anemia, unspecified: Secondary | ICD-10-CM

## 2023-05-10 DIAGNOSIS — M8580 Other specified disorders of bone density and structure, unspecified site: Secondary | ICD-10-CM

## 2023-05-10 DIAGNOSIS — E271 Primary adrenocortical insufficiency: Secondary | ICD-10-CM

## 2023-05-10 DIAGNOSIS — R2681 Unsteadiness on feet: Secondary | ICD-10-CM

## 2023-05-10 DIAGNOSIS — Z1211 Encounter for screening for malignant neoplasm of colon: Secondary | ICD-10-CM

## 2023-05-10 DIAGNOSIS — E782 Mixed hyperlipidemia: Secondary | ICD-10-CM

## 2023-05-10 MED ORDER — CEQUA 0.09 % OP SOLN
1.0000 [drp] | Freq: Two times a day (BID) | OPHTHALMIC | 3 refills | Status: AC
Start: 2023-05-10 — End: ?

## 2023-05-10 MED ORDER — SIMVASTATIN 20 MG PO TABS: 20.0000 mg | ORAL_TABLET | Freq: Every day | ORAL | 3 refills | Status: AC

## 2023-05-10 NOTE — Telephone Encounter (Signed)
 Pharmacy Patient Advocate Encounter   Received notification from CoverMyMeds that prior authorization for Cequa 0.09% solution is required/requested.   Insurance verification completed.   The patient is insured through [facility] .   Per test claim: PA required; PA submitted to above mentioned insurance via CoverMyMeds Key/confirmation #/[id number] Status is pending

## 2023-05-11 ENCOUNTER — Encounter: Payer: Self-pay | Admitting: Family Medicine

## 2023-05-11 LAB — HEMOGLOBIN A1C: Hgb A1c MFr Bld: 6.3 % (ref 4.6–6.5)

## 2023-05-11 LAB — CBC
HCT: 39.6 % (ref 36.0–46.0)
Hemoglobin: 12.9 g/dL (ref 12.0–15.0)
MCHC: 32.7 g/dL (ref 30.0–36.0)
MCV: 85.9 fl (ref 78.0–100.0)
Platelets: 264 10*3/uL (ref 150.0–400.0)
RBC: 4.61 Mil/uL (ref 3.87–5.11)
RDW: 13.1 % (ref 11.5–15.5)
WBC: 6.8 10*3/uL (ref 4.0–10.5)

## 2023-05-11 LAB — COMPREHENSIVE METABOLIC PANEL WITH GFR
ALT: 29 U/L (ref 0–35)
AST: 21 U/L (ref 0–37)
Albumin: 4.2 g/dL (ref 3.5–5.2)
Alkaline Phosphatase: 66 U/L (ref 39–117)
BUN: 10 mg/dL (ref 6–23)
CO2: 28 meq/L (ref 19–32)
Calcium: 9.2 mg/dL (ref 8.4–10.5)
Chloride: 103 meq/L (ref 96–112)
Creatinine, Ser: 1.03 mg/dL (ref 0.40–1.20)
GFR: 54.48 mL/min — ABNORMAL LOW (ref >60.00)
Glucose, Bld: 94 mg/dL (ref 70–99)
Potassium: 4.3 meq/L (ref 3.5–5.1)
Sodium: 138 meq/L (ref 135–145)
Total Bilirubin: 0.4 mg/dL (ref 0.2–1.2)
Total Protein: 8.1 g/dL (ref 6.0–8.3)

## 2023-05-11 LAB — LIPID PANEL
Cholesterol: 181 mg/dL (ref 0–200)
HDL: 49.5 mg/dL (ref >39.00)
LDL Cholesterol: 117 mg/dL — ABNORMAL HIGH (ref 0–99)
NonHDL: 131.09
Total CHOL/HDL Ratio: 4
Triglycerides: 72 mg/dL (ref 0.0–149.0)
VLDL: 14.4 mg/dL (ref 0.0–40.0)

## 2023-05-11 LAB — TSH: TSH: 1.18 u[IU]/mL (ref 0.35–5.50)

## 2023-05-11 LAB — VITAMIN D 25 HYDROXY (VIT D DEFICIENCY, FRACTURES): VITD: 37.63 ng/mL (ref 30.00–100.00)

## 2023-05-11 NOTE — Telephone Encounter (Signed)
 Pharmacy Patient Advocate Encounter  Received notification from [facility] that Prior Authorization for Cequa 0.09% solution has been DENIED.  Full denial letter will be uploaded to the media tab. See denial reason below.   PA #/Case ID/Reference #: Key: [id number]

## 2023-05-12 ENCOUNTER — Encounter: Payer: Self-pay | Admitting: Family Medicine

## 2023-05-30 DIAGNOSIS — Z961 Presence of intraocular lens: Secondary | ICD-10-CM | POA: Diagnosis not present

## 2023-05-30 DIAGNOSIS — H524 Presbyopia: Secondary | ICD-10-CM | POA: Diagnosis not present

## 2023-05-30 DIAGNOSIS — H52223 Regular astigmatism, bilateral: Secondary | ICD-10-CM | POA: Diagnosis not present

## 2023-05-30 DIAGNOSIS — H5212 Myopia, left eye: Secondary | ICD-10-CM | POA: Diagnosis not present

## 2023-05-30 LAB — HM DIABETES EYE EXAM

## 2023-06-08 ENCOUNTER — Ambulatory Visit: Attending: Family Medicine

## 2023-06-08 ENCOUNTER — Other Ambulatory Visit: Payer: Self-pay

## 2023-06-08 DIAGNOSIS — R262 Difficulty in walking, not elsewhere classified: Secondary | ICD-10-CM | POA: Diagnosis not present

## 2023-06-08 DIAGNOSIS — R2689 Other abnormalities of gait and mobility: Secondary | ICD-10-CM | POA: Diagnosis not present

## 2023-06-08 DIAGNOSIS — R2681 Unsteadiness on feet: Secondary | ICD-10-CM | POA: Diagnosis not present

## 2023-06-08 DIAGNOSIS — M6281 Muscle weakness (generalized): Secondary | ICD-10-CM | POA: Insufficient documentation

## 2023-06-08 NOTE — Therapy (Signed)
 OUTPATIENT PHYSICAL THERAPY LOWER EXTREMITY EVALUATION   Patient Name: Parish Augustine MRN: [id number] DOB:07-Sep-1950, 73 y.o., female Today's Date: 06/08/2023  END OF SESSION:  PT End of Session - 06/08/23 1029     Visit Number 1    Date for PT Re-Evaluation 08/31/23    Authorization Type [employer] MCR    Progress Note Due on Visit 10    PT Start Time 1020    PT Stop Time 1100    PT Time Calculation (min) 40 min             Past Medical History:  Diagnosis Date   Allergic rhinitis due to pollen    Allergy    Anemia, unspecified    Blood transfusion without reported diagnosis    1979   Disorder of bone and cartilage, unspecified    Eosinophilia    Headache 09/24/2016   Lymphocytosis (symptomatic)    Osteoporosis    Other abnormal blood chemistry    Other and unspecified hyperlipidemia    Pain in joint, shoulder region    Reflux esophagitis    Type II or unspecified type diabetes mellitus without mention of complication, uncontrolled    Type II or unspecified type diabetes mellitus without mention of complication, uncontrolled    Umbilical hernia without mention of obstruction or gangrene    Unspecified disorder of liver    Past Surgical History:  Procedure Laterality Date   CATARACT EXTRACTION, BILATERAL     CESAREAN SECTION     COLONOSCOPY  2015   Dr. Arvie Latus, normal exam   Patient Active Problem List   Diagnosis Date Noted   Impingement syndrome of left shoulder 10/09/2019   Osteopenia 10/03/2019   Memory loss 06/23/2017   Gait abnormality 06/23/2017   Uterine prolapse 09/24/2016   Headache 09/24/2016   Hyponatremia 03/03/2015   Special screening for malignant neoplasms, colon 12/10/2012   Lymphocytosis (symptomatic)    Disorder of bone and cartilage    Controlled type 2 diabetes mellitus without complication, without long-term current use of insulin (HCC)    Reflux esophagitis    Anemia, unspecified    Dyslipidemia    Lymphocytosis 01/16/2012    Idiopathic eosinophilia 04/05/2011   NONSPECIFIC ABNORMAL RESULTS LIVR FUNCTION STUDY 08/15/2008   ANEMIA, HX OF 08/15/2008    PCP: Bobbette Burns, MD  REFERRING PROVIDER: same  REFERRING DIAG: unsteady gait and balance  THERAPY DIAG:  Other abnormalities of gait and mobility  Difficulty in walking, not elsewhere classified  Rationale for Evaluation and Treatment: Rehabilitation  ONSET DATE: 1-2 years , more decline last 6 months  SUBJECTIVE:   SUBJECTIVE STATEMENT: Pt attends with her husband.  They both report that her gait has steadily declined, even since she participated in PT 6 months ago.  She cannot go on outings much and if she has to walk in the community she pitches forward more and more.  Did used to have calf pain with walking and reduced her walking,which was attributed to arterial claudication on R but according to vascular doctor this dx is resolved.  PERTINENT HISTORY: + for DM, h/o of arterial claudication R LE, h/o anemia, osteopenia PAIN:  Are you having pain? Yes: NPRS scale: 0 to 3 Pain location: B calves Pain description: cramping pain Aggravating factors: walking too far Relieving factors: rest  PRECAUTIONS: Fall  RED FLAGS: None   WEIGHT BEARING RESTRICTIONS: No  FALLS:  Has patient fallen in last 6 months? No  LIVING ENVIRONMENT: Lives with: lives with their  family Lives in: House/apartment Stairs: bedroom upstairs flight with rail, also 1-2 steps to get into home Has following equipment at home: None  OCCUPATION: retired  PLOF: Independent  PATIENT GOALS: husband and pt want her walking to get better  NEXT MD VISIT: 07/05/23  OBJECTIVE:  Note: Objective measures were completed at Evaluation unless otherwise noted.  DIAGNOSTIC FINDINGS: na  PATIENT SURVEYS:  ABC scale TBD  COGNITION: Overall cognitive status: Within functional limits for tasks assessed     SENSATION: WFL  EDEMA:  None noted  MUSCLE  LENGTH: Hamstrings: Right -15 deg; Left -15 deg Thomas test: Right wnl deg; Left wnl deg  POSTURE: weight bearing B forefeet.  Forward flexed through lumbar and B hips  PALPATION: Non tender B plantarflexors  LOWER EXTREMITY ROM:  Active ROM Right eval Left eval  Hip flexion wfl wfl  Hip extension    Hip abduction    Hip adduction    Hip internal rotation    Hip external rotation    Knee flexion    Knee extension    Ankle dorsiflexion -10 -4  Ankle plantarflexion    Ankle inversion    Ankle eversion     (Blank rows = wfl)  LOWER EXTREMITY MMT:  MMT Right eval Left eval  Hip flexion 4- 4-  Hip extension 4- 4-  Hip abduction    Hip adduction    Hip internal rotation    Hip external rotation    Knee flexion    Knee extension    Ankle dorsiflexion 3+ 4  Ankle plantarflexion 3- 3  Ankle inversion    Ankle eversion     (Blank rows = not tested)  Coordination: decreased with alt toe tapping in sitting, more so on R  FUNCTIONAL TESTS:  30 seconds chair stand test 7 reps  Timed up and go (TUG): 23.29  Berg: 38/56  GAIT: Distance walked: 71' in clinic  Assistive device utilized: None Level of assistance: CGA Gait  with forward lean, decreased stride length R LE, festinating which increased with distance, weight bearing on B forefeet. Pt utilized therapist's arm /cart for support to walk across clinic, caught R foot and stumbled 2 x during today's session                                                                                                                                 TREATMENT DATE: 06/08/23:  Evaluation, assessment of balance, functional tests, instructed the pt in the 2 ex as listed below, with emphasis on gastroc stretch combined with the hamstring stretch. Discussed assistive device briefly and pt not ready/willing to utilize.    PATIENT EDUCATION:  Education details: POC , goals Person educated: Patient and Spouse Education method:  Explanation, Demonstration, Actor cues, Verbal cues, and Handouts Education comprehension: verbalized understanding, returned demonstration, verbal cues required, tactile cues required, and needs further education  HOME EXERCISE PROGRAM: Access Code: [email or web address] URL: [email or web address]  Date: 06/08/2023 Prepared by: Shaaron Dar  Exercises - Seated Hamstring Stretch with Strap  - 2 x daily - 7 x weekly - 1 sets - 6 reps - 10sec hold - Heel Toe Raises with Counter Support  - 1 x daily - 7 x weekly - 2 sets - 10 reps  ASSESSMENT:  CLINICAL IMPRESSION: Patient is a 73 y.o. female who was evaluated today by physical therapy due to a decline in her gait and balance.  She presents as a high fall risk with her Berg score of 38/56. She demonstrates a festinating, slow gait with forward trunk flexion and weight bearing on B forefeet. She is very stiff in B ankles and demonstrates LE coordination deficits. She should benefit from skilled PT to address these deficits and hopefully improve her overall function and safety.  OBJECTIVE IMPAIRMENTS: Abnormal gait, decreased activity tolerance, decreased balance, decreased coordination, decreased endurance, difficulty walking, decreased ROM, decreased strength, impaired flexibility, postural dysfunction, and pain.   ACTIVITY LIMITATIONS: carrying, squatting, stairs, hygiene/grooming, locomotion level, and caring for others  PARTICIPATION LIMITATIONS: meal prep, cleaning, laundry, shopping, community activity, and yard work  PERSONAL FACTORS: Age, Behavior pattern, Fitness, Past/current experiences, Time since onset of injury/illness/exacerbation, and 1-2 comorbidities: h/o DM, anemia,osteopenia are also affecting patient's functional outcome.   REHAB POTENTIAL: Good  CLINICAL DECISION MAKING: Evolving/moderate complexity  EVALUATION COMPLEXITY: Moderate   GOALS: Goals reviewed with patient? Yes  SHORT TERM GOALS: Target date: 2 weeks,  06/22/23 I HEP Baseline: Goal status: INITIAL   LONG TERM GOALS: Target date: 08/31/23  30 sec sit to stand improve from 7 to 12 or greater to demonstrate improved coordination and functional strength Baseline:  Goal status: INITIAL  2.  Berg score improve from 38/56 to 45/56 or higher Baseline:  Goal status: INITIAL  3.  TUG score 14 or less Baseline: 23.29 Goal status: INITIAL  4.  Able to walk over 300', household distance with LAD and modified I  Baseline: needs external support, furniture, HHA Goal status: INITIAL  5.  Assess ABC balance confidence score and set goal Baseline:  Goal status: INITIAL   PLAN:  PT FREQUENCY: 2x/week  PT DURATION: 12 weeks  PLANNED INTERVENTIONS: 97110-Therapeutic exercises, 97530- Therapeutic activity, 97112- Neuromuscular re-education, 97535- Self Care, 16109- Manual therapy, 97116- Gait training, and Balance training  PLAN FOR NEXT SESSION: specific stretching for B ankle dorsiflexion , standing balance and coordination ex, speed and direction changes, righting reactions   Elodie Panameno L Ahsan Esterline, PT, DPT, OCS 06/08/2023, 1:18 PM

## 2023-06-12 ENCOUNTER — Ambulatory Visit

## 2023-06-19 ENCOUNTER — Encounter: Payer: Self-pay | Admitting: Family Medicine

## 2023-06-21 ENCOUNTER — Ambulatory Visit

## 2023-06-21 DIAGNOSIS — R2681 Unsteadiness on feet: Secondary | ICD-10-CM | POA: Diagnosis not present

## 2023-06-21 DIAGNOSIS — R262 Difficulty in walking, not elsewhere classified: Secondary | ICD-10-CM | POA: Diagnosis not present

## 2023-06-21 DIAGNOSIS — M6281 Muscle weakness (generalized): Secondary | ICD-10-CM | POA: Diagnosis not present

## 2023-06-21 DIAGNOSIS — R2689 Other abnormalities of gait and mobility: Secondary | ICD-10-CM | POA: Diagnosis not present

## 2023-06-21 NOTE — Therapy (Signed)
 OUTPATIENT PHYSICAL THERAPY LOWER EXTREMITY EVALUATION   Patient Name: Heidi [name] MRN: [id number] DOB:[birth year], 73 y.o., female Today's Date: 06/21/2023  END OF SESSION:  PT End of Session - 06/21/23 1101     Visit Number 2    Date for PT Re-Evaluation 08/31/23    Authorization Type [employer] MCR    Progress Note Due on Visit 10    PT Start Time 1020    PT Stop Time 1100    PT Time Calculation (min) 40 min    Activity Tolerance Patient tolerated treatment well    Behavior During Therapy WFL for tasks assessed/performed           Past Medical History:  Diagnosis Date   Allergic rhinitis due to pollen    Allergy    Anemia, unspecified    Blood transfusion without reported diagnosis    1979   Disorder of bone and cartilage, unspecified    Eosinophilia    Headache 09/24/2016   Lymphocytosis (symptomatic)    Osteoporosis    Other abnormal blood chemistry    Other and unspecified hyperlipidemia    Pain in joint, shoulder region    Reflux esophagitis    Type II or unspecified type diabetes mellitus without mention of complication, uncontrolled    Type II or unspecified type diabetes mellitus without mention of complication, uncontrolled    Umbilical hernia without mention of obstruction or gangrene    Unspecified disorder of liver    Past Surgical History:  Procedure Laterality Date   CATARACT EXTRACTION, BILATERAL     CESAREAN SECTION     COLONOSCOPY  2015   Dr. Arvie Latus, normal exam   Patient Active Problem List   Diagnosis Date Noted   Impingement syndrome of left shoulder 10/09/2019   Osteopenia 10/03/2019   Memory loss 06/23/2017   Gait abnormality 06/23/2017   Uterine prolapse 09/24/2016   Headache 09/24/2016   Hyponatremia 03/03/2015   Special screening for malignant neoplasms, colon 12/10/2012   Lymphocytosis (symptomatic)    Disorder of bone and cartilage    Controlled type 2 diabetes mellitus without complication, without long-term current use of  insulin (HCC)    Reflux esophagitis    Anemia, unspecified    Dyslipidemia    Lymphocytosis 01/16/2012   Idiopathic eosinophilia 04/05/2011   NONSPECIFIC ABNORMAL RESULTS LIVR FUNCTION STUDY 08/15/2008   ANEMIA, HX OF 08/15/2008    PCP: Bobbette Burns, MD  REFERRING PROVIDER: same  REFERRING DIAG: unsteady gait and balance  THERAPY DIAG:  Other abnormalities of gait and mobility  Difficulty in walking, not elsewhere classified  Muscle weakness (generalized)  Rationale for Evaluation and Treatment: Rehabilitation  ONSET DATE: 1-2 years , more decline last 6 months  SUBJECTIVE:   SUBJECTIVE STATEMENT: Pt reports her calf pain is much better. Doing good with HEP  PERTINENT HISTORY: + for DM, h/o of arterial claudication R LE, h/o anemia, osteopenia PAIN:  Are you having pain? Yes: NPRS scale: 0 to 3 Pain location: B calves Pain description: cramping pain Aggravating factors: walking too far Relieving factors: rest  PRECAUTIONS: Fall  RED FLAGS: None   WEIGHT BEARING RESTRICTIONS: No  FALLS:  Has patient fallen in last 6 months? No  LIVING ENVIRONMENT: Lives with: lives with their family Lives in: House/apartment Stairs: bedroom upstairs flight with rail, also 1-2 steps to get into home Has following equipment at home: None  OCCUPATION: retired  PLOF: Independent  PATIENT GOALS: husband and pt want her walking to get better  NEXT MD VISIT: 07/05/23  OBJECTIVE:  Note: Objective measures were completed at Evaluation unless otherwise noted.  DIAGNOSTIC FINDINGS: na  PATIENT SURVEYS:  ABC scale TBD  COGNITION: Overall cognitive status: Within functional limits for tasks assessed     SENSATION: WFL  EDEMA:  None noted  MUSCLE LENGTH: Hamstrings: Right -15 deg; Left -15 deg Thomas test: Right wnl deg; Left wnl deg  POSTURE: weight bearing B forefeet.  Forward flexed through lumbar and B hips  PALPATION: Non tender B  plantarflexors  LOWER EXTREMITY ROM:  Active ROM Right eval Left eval  Hip flexion wfl wfl  Hip extension    Hip abduction    Hip adduction    Hip internal rotation    Hip external rotation    Knee flexion    Knee extension    Ankle dorsiflexion -10 -4  Ankle plantarflexion    Ankle inversion    Ankle eversion     (Blank rows = wfl)  LOWER EXTREMITY MMT:  MMT Right eval Left eval  Hip flexion 4- 4-  Hip extension 4- 4-  Hip abduction    Hip adduction    Hip internal rotation    Hip external rotation    Knee flexion    Knee extension    Ankle dorsiflexion 3+ 4  Ankle plantarflexion 3- 3  Ankle inversion    Ankle eversion     (Blank rows = not tested)  Coordination: decreased with alt toe tapping in sitting, more so on R  FUNCTIONAL TESTS:  30 seconds chair stand test 7 reps  Timed up and go (TUG): 23.29  Berg: 38/56  GAIT: Distance walked: 31' in clinic  Assistive device utilized: None Level of assistance: CGA Gait  with forward lean, decreased stride length R LE, festinating which increased with distance, weight bearing on B forefeet. Pt utilized therapist's arm /cart for support to walk across clinic, caught R foot and stumbled 2 x during today's session                                                                                                                                 TREATMENT DATE:  06/21/23  Nustep L3x59min NEUROMUSCULAR RE-EDUCATION: To improve coordination, kinesthesia, posture, and proprioception.  Standing heel/toe raises x 10 Standing hip abduction x 10 Standing marching x 10 Standing hip extension 10  Standing toe taps to yoga block x 10 B Reciprocal arm reaches to targets x 10 Airex balance narrow BOS 10 secs  Added head turns x 10 06/08/23:  Evaluation, assessment of balance, functional tests, instructed the pt in the 2 ex as listed below, with emphasis on gastroc stretch combined with the hamstring stretch. Discussed assistive  device briefly and pt not ready/willing to utilize.    PATIENT EDUCATION:  Education details: HEP- see below Person educated: Patient and Spouse Education method: Explanation, Demonstration, Tactile cues, Verbal cues, and Handouts Education comprehension: verbalized understanding, returned  demonstration, verbal cues required, tactile cues required, and needs further education  HOME EXERCISE PROGRAM: Access Code: [email or web address] URL: [email or web address] Date: 06/21/2023 Prepared by: Dovie Gell  Exercises - Seated Hamstring Stretch with Strap  - 2 x daily - 7 x weekly - 1 sets - 6 reps - 10sec hold - Heel Toe Raises with Counter Support  - 1 x daily - 7 x weekly - 2 sets - 10 reps - Standing Hip Extension with Counter Support  - 1 x daily - 7 x weekly - 2 sets - 10 reps - Standing Hip Abduction with Counter Support  - 1 x daily - 7 x weekly - 2 sets - 10 reps - Standing March with Counter Support  - 1 x daily - 7 x weekly - 2 sets - 10 reps  ASSESSMENT:  CLINICAL IMPRESSION: Worked on hip strength and balance activities to emphasize reaching and static balance on compliant surfaces. Pt very slow with her movements, she may benefit from more trunk mobility along with balance.  Eval: Patient is a 73 y.o. female who was evaluated today by physical therapy due to a decline in her gait and balance.  She presents as a high fall risk with her Berg score of 38/56. She demonstrates a festinating, slow gait with forward trunk flexion and weight bearing on B forefeet. She is very stiff in B ankles and demonstrates LE coordination deficits. She should benefit from skilled PT to address these deficits and hopefully improve her overall function and safety.  OBJECTIVE IMPAIRMENTS: Abnormal gait, decreased activity tolerance, decreased balance, decreased coordination, decreased endurance, difficulty walking, decreased ROM, decreased strength, impaired flexibility, postural dysfunction, and  pain.   ACTIVITY LIMITATIONS: carrying, squatting, stairs, hygiene/grooming, locomotion level, and caring for others  PARTICIPATION LIMITATIONS: meal prep, cleaning, laundry, shopping, community activity, and yard work  PERSONAL FACTORS: Age, Behavior pattern, Fitness, Past/current experiences, Time since onset of injury/illness/exacerbation, and 1-2 comorbidities: h/o DM, anemia,osteopenia are also affecting patient's functional outcome.   REHAB POTENTIAL: Good  CLINICAL DECISION MAKING: Evolving/moderate complexity  EVALUATION COMPLEXITY: Moderate   GOALS: Goals reviewed with patient? Yes  SHORT TERM GOALS: Target date: 2 weeks, 06/22/23 I HEP Baseline: Goal status: INITIAL   LONG TERM GOALS: Target date: 08/31/23  30 sec sit to stand improve from 7 to 12 or greater to demonstrate improved coordination and functional strength Baseline:  Goal status: INITIAL  2.  Berg score improve from 38/56 to 45/56 or higher Baseline:  Goal status: INITIAL  3.  TUG score 14 or less Baseline: 23.29 Goal status: INITIAL  4.  Able to walk over 300', household distance with LAD and modified I  Baseline: needs external support, furniture, HHA Goal status: INITIAL  5.  Assess ABC balance confidence score and set goal Baseline:  Goal status: INITIAL   PLAN:  PT FREQUENCY: 2x/week  PT DURATION: 12 weeks  PLANNED INTERVENTIONS: 97110-Therapeutic exercises, 97530- Therapeutic activity, 97112- Neuromuscular re-education, 97535- Self Care, 13086- Manual therapy, [phone]- Gait training, and Balance training  PLAN FOR NEXT SESSION: specific stretching for B ankle dorsiflexion , standing balance and coordination ex, speed and direction changes, righting reactions   Heidi [name], PTA 06/21/2023, 11:25 AM

## 2023-06-23 ENCOUNTER — Ambulatory Visit

## 2023-06-23 DIAGNOSIS — R262 Difficulty in walking, not elsewhere classified: Secondary | ICD-10-CM | POA: Diagnosis not present

## 2023-06-23 DIAGNOSIS — R2689 Other abnormalities of gait and mobility: Secondary | ICD-10-CM

## 2023-06-23 DIAGNOSIS — R2681 Unsteadiness on feet: Secondary | ICD-10-CM | POA: Diagnosis not present

## 2023-06-23 DIAGNOSIS — M6281 Muscle weakness (generalized): Secondary | ICD-10-CM

## 2023-06-23 NOTE — Therapy (Signed)
 OUTPATIENT PHYSICAL THERAPY LOWER EXTREMITY EVALUATION   Patient Name: Heidi [name] MRN: [id number] DOB:December 12, 1950, 73 y.o., female Today's Date: 06/23/2023  END OF SESSION:  PT End of Session - 06/23/23 1058     Visit Number 3    Date for PT Re-Evaluation 08/31/23    Authorization Type [employer] MCR    Progress Note Due on Visit 10    PT Start Time 1020   pt late   PT Stop Time 1059    PT Time Calculation (min) 39 min    Activity Tolerance Patient tolerated treatment well    Behavior During Therapy WFL for tasks assessed/performed            Past Medical History:  Diagnosis Date   Allergic rhinitis due to pollen    Allergy    Anemia, unspecified    Blood transfusion without reported diagnosis    1979   Disorder of bone and cartilage, unspecified    Eosinophilia    Headache 09/24/2016   Lymphocytosis (symptomatic)    Osteoporosis    Other abnormal blood chemistry    Other and unspecified hyperlipidemia    Pain in joint, shoulder region    Reflux esophagitis    Type II or unspecified type diabetes mellitus without mention of complication, uncontrolled    Type II or unspecified type diabetes mellitus without mention of complication, uncontrolled    Umbilical hernia without mention of obstruction or gangrene    Unspecified disorder of liver    Past Surgical History:  Procedure Laterality Date   CATARACT EXTRACTION, BILATERAL     CESAREAN SECTION     COLONOSCOPY  2015   Dr. Arvie Latus, normal exam   Patient Active Problem List   Diagnosis Date Noted   Impingement syndrome of left shoulder 10/09/2019   Osteopenia 10/03/2019   Memory loss 06/23/2017   Gait abnormality 06/23/2017   Uterine prolapse 09/24/2016   Headache 09/24/2016   Hyponatremia 03/03/2015   Special screening for malignant neoplasms, colon 12/10/2012   Lymphocytosis (symptomatic)    Disorder of bone and cartilage    Controlled type 2 diabetes mellitus without complication, without long-term  current use of insulin (HCC)    Reflux esophagitis    Anemia, unspecified    Dyslipidemia    Lymphocytosis 01/16/2012   Idiopathic eosinophilia 04/05/2011   NONSPECIFIC ABNORMAL RESULTS LIVR FUNCTION STUDY 08/15/2008   ANEMIA, HX OF 08/15/2008    PCP: Bobbette Burns, MD  REFERRING PROVIDER: same  REFERRING DIAG: unsteady gait and balance  THERAPY DIAG:  Other abnormalities of gait and mobility  Difficulty in walking, not elsewhere classified  Muscle weakness (generalized)  Rationale for Evaluation and Treatment: Rehabilitation  ONSET DATE: 1-2 years , more decline last 6 months  SUBJECTIVE:   SUBJECTIVE STATEMENT: Pt reports her calf pain is much better. Doing good with HEP  PERTINENT HISTORY: + for DM, h/o of arterial claudication R LE, h/o anemia, osteopenia PAIN:  Are you having pain? Yes: NPRS scale: 0 to 3 Pain location: B calves Pain description: cramping pain Aggravating factors: walking too far Relieving factors: rest  PRECAUTIONS: Fall  RED FLAGS: None   WEIGHT BEARING RESTRICTIONS: No  FALLS:  Has patient fallen in last 6 months? No  LIVING ENVIRONMENT: Lives with: lives with their family Lives in: House/apartment Stairs: bedroom upstairs flight with rail, also 1-2 steps to get into home Has following equipment at home: None  OCCUPATION: retired  PLOF: Independent  PATIENT GOALS: husband and pt want her walking  to get better  NEXT MD VISIT: 07/05/23  OBJECTIVE:  Note: Objective measures were completed at Evaluation unless otherwise noted.  DIAGNOSTIC FINDINGS: na  PATIENT SURVEYS:  ABC scale TBD  COGNITION: Overall cognitive status: Within functional limits for tasks assessed     SENSATION: WFL  EDEMA:  None noted  MUSCLE LENGTH: Hamstrings: Right -15 deg; Left -15 deg Thomas test: Right wnl deg; Left wnl deg  POSTURE: weight bearing B forefeet.  Forward flexed through lumbar and B hips  PALPATION: Non tender B  plantarflexors  LOWER EXTREMITY ROM:  Active ROM Right eval Left eval  Hip flexion wfl wfl  Hip extension    Hip abduction    Hip adduction    Hip internal rotation    Hip external rotation    Knee flexion    Knee extension    Ankle dorsiflexion -10 -4  Ankle plantarflexion    Ankle inversion    Ankle eversion     (Blank rows = wfl)  LOWER EXTREMITY MMT:  MMT Right eval Left eval  Hip flexion 4- 4-  Hip extension 4- 4-  Hip abduction    Hip adduction    Hip internal rotation    Hip external rotation    Knee flexion    Knee extension    Ankle dorsiflexion 3+ 4  Ankle plantarflexion 3- 3  Ankle inversion    Ankle eversion     (Blank rows = not tested)  Coordination: decreased with alt toe tapping in sitting, more so on R  FUNCTIONAL TESTS:  30 seconds chair stand test 7 reps  Timed up and go (TUG): 23.29  Berg: 38/56  GAIT: Distance walked: 17' in clinic  Assistive device utilized: None Level of assistance: CGA Gait  with forward lean, decreased stride length R LE, festinating which increased with distance, weight bearing on B forefeet. Pt utilized therapist's arm /cart for support to walk across clinic, caught R foot and stumbled 2 x during today's session                                                                                                                                 TREATMENT DATE:  06/23/23 Nustep L4x46min UE/LE LAQ 2lb 2 x 10 BLE NEUROMUSCULAR RE-EDUCATION: To improve coordination, kinesthesia, posture, and proprioception.  Standing heel/toe raises x 15 Standing hip abduction 2x10 Standing marching 2 x 10 Standing hip extension 2x10 - cues to lift moving foot off of floor Fwd step over pool noodle x 10 B Standing bird dog x 10 B- cues for proper motion Gait Training attempted to use hiking poles but unable to coordinate movements  06/21/23 Nustep L3x35min NEUROMUSCULAR RE-EDUCATION: To improve coordination, kinesthesia, posture, and  proprioception.  Standing heel/toe raises x 10 Standing hip abduction x 10 Standing marching x 10 Standing hip extension 10  Standing toe taps to yoga block x 10 B Reciprocal arm reaches to targets x 10  Airex balance narrow BOS 10 secs  Added head turns x 10 06/08/23:  Evaluation, assessment of balance, functional tests, instructed the pt in the 2 ex as listed below, with emphasis on gastroc stretch combined with the hamstring stretch. Discussed assistive device briefly and pt not ready/willing to utilize.    PATIENT EDUCATION:  Education details: HEP- see below Person educated: Patient and Spouse Education method: Explanation, Demonstration, Tactile cues, Verbal cues, and Handouts Education comprehension: verbalized understanding, returned demonstration, verbal cues required, tactile cues required, and needs further education  HOME EXERCISE PROGRAM: Access Code: [email or web address] URL: [email or web address] Date: 06/21/2023 Prepared by: Dovie Gell  Exercises - Seated Hamstring Stretch with Strap  - 2 x daily - 7 x weekly - 1 sets - 6 reps - 10sec hold - Heel Toe Raises with Counter Support  - 1 x daily - 7 x weekly - 2 sets - 10 reps - Standing Hip Extension with Counter Support  - 1 x daily - 7 x weekly - 2 sets - 10 reps - Standing Hip Abduction with Counter Support  - 1 x daily - 7 x weekly - 2 sets - 10 reps - Standing March with Counter Support  - 1 x daily - 7 x weekly - 2 sets - 10 reps  ASSESSMENT:  CLINICAL IMPRESSION: Progressed balance activities, LE strengthening, and gait training to tolerance. Pt continues to have shortened strides with little arm movement during gait. We tried using hiking poles, which helped with her confidence in increasing her stride length, however she had difficulty coordinating movement with the poles.  Eval: Patient is a 73 y.o. female who was evaluated today by physical therapy due to a decline in her gait and balance.  She presents  as a high fall risk with her Berg score of 38/56. She demonstrates a festinating, slow gait with forward trunk flexion and weight bearing on B forefeet. She is very stiff in B ankles and demonstrates LE coordination deficits. She should benefit from skilled PT to address these deficits and hopefully improve her overall function and safety.  OBJECTIVE IMPAIRMENTS: Abnormal gait, decreased activity tolerance, decreased balance, decreased coordination, decreased endurance, difficulty walking, decreased ROM, decreased strength, impaired flexibility, postural dysfunction, and pain.   ACTIVITY LIMITATIONS: carrying, squatting, stairs, hygiene/grooming, locomotion level, and caring for others  PARTICIPATION LIMITATIONS: meal prep, cleaning, laundry, shopping, community activity, and yard work  PERSONAL FACTORS: Age, Behavior pattern, Fitness, Past/current experiences, Time since onset of injury/illness/exacerbation, and 1-2 comorbidities: h/o DM, anemia,osteopenia are also affecting patient's functional outcome.   REHAB POTENTIAL: Good  CLINICAL DECISION MAKING: Evolving/moderate complexity  EVALUATION COMPLEXITY: Moderate   GOALS: Goals reviewed with patient? Yes  SHORT TERM GOALS: Target date: 2 weeks, 06/22/23 I HEP Baseline: Goal status: 06/23/23 still need clarification   LONG TERM GOALS: Target date: 08/31/23  30 sec sit to stand improve from 7 to 12 or greater to demonstrate improved coordination and functional strength Baseline:  Goal status: INITIAL  2.  Berg score improve from 38/56 to 45/56 or higher Baseline:  Goal status: INITIAL  3.  TUG score 14 or less Baseline: 23.29 Goal status: INITIAL  4.  Able to walk over 300', household distance with LAD and modified I  Baseline: needs external support, furniture, HHA Goal status: INITIAL  5.  Assess ABC balance confidence score and set goal Baseline:  Goal status: INITIAL   PLAN:  PT FREQUENCY: 2x/week  PT DURATION:  12 weeks  PLANNED INTERVENTIONS: 97110-Therapeutic exercises, 97530- Therapeutic  activity, [id number]- Neuromuscular re-education, [phone]- Self Care, [id number]- Manual therapy, [phone]- Gait training, and Balance training  PLAN FOR NEXT SESSION: specific stretching for B ankle dorsiflexion , standing balance and coordination ex, speed and direction changes, righting reactions   Emanie Behan L Paeton Studer, PTA 06/23/2023, 10:59 AM

## 2023-06-26 ENCOUNTER — Ambulatory Visit

## 2023-06-26 DIAGNOSIS — R2689 Other abnormalities of gait and mobility: Secondary | ICD-10-CM

## 2023-06-26 DIAGNOSIS — R262 Difficulty in walking, not elsewhere classified: Secondary | ICD-10-CM | POA: Diagnosis not present

## 2023-06-26 DIAGNOSIS — R2681 Unsteadiness on feet: Secondary | ICD-10-CM | POA: Diagnosis not present

## 2023-06-26 DIAGNOSIS — M6281 Muscle weakness (generalized): Secondary | ICD-10-CM | POA: Diagnosis not present

## 2023-06-26 NOTE — Therapy (Signed)
 OUTPATIENT PHYSICAL THERAPY LOWER EXTREMITY TREATMENT   Patient Name: Heidi [name] MRN: [id number] DOB:1950/08/20, 73 y.o., female Today's Date: 06/26/2023  END OF SESSION:  PT End of Session - 06/26/23 1108     Visit Number 4    Date for PT Re-Evaluation 08/31/23    Authorization Type [employer] MCR    Progress Note Due on Visit 10    PT Start Time 1102    PT Stop Time 1144    PT Time Calculation (min) 42 min    Activity Tolerance Patient tolerated treatment well    Behavior During Therapy WFL for tasks assessed/performed             Past Medical History:  Diagnosis Date   Allergic rhinitis due to pollen    Allergy    Anemia, unspecified    Blood transfusion without reported diagnosis    1979   Disorder of bone and cartilage, unspecified    Eosinophilia    Headache 09/24/2016   Lymphocytosis (symptomatic)    Osteoporosis    Other abnormal blood chemistry    Other and unspecified hyperlipidemia    Pain in joint, shoulder region    Reflux esophagitis    Type II or unspecified type diabetes mellitus without mention of complication, uncontrolled    Type II or unspecified type diabetes mellitus without mention of complication, uncontrolled    Umbilical hernia without mention of obstruction or gangrene    Unspecified disorder of liver    Past Surgical History:  Procedure Laterality Date   CATARACT EXTRACTION, BILATERAL     CESAREAN SECTION     COLONOSCOPY  2015   Dr. Debrah, normal exam   Patient Active Problem List   Diagnosis Date Noted   Impingement syndrome of left shoulder 10/09/2019   Osteopenia 10/03/2019   Memory loss 06/23/2017   Gait abnormality 06/23/2017   Uterine prolapse 09/24/2016   Headache 09/24/2016   Hyponatremia 03/03/2015   Special screening for malignant neoplasms, colon 12/10/2012   Lymphocytosis (symptomatic)    Disorder of bone and cartilage    Controlled type 2 diabetes mellitus without complication, without long-term current use of  insulin (HCC)    Reflux esophagitis    Anemia, unspecified    Dyslipidemia    Lymphocytosis 01/16/2012   Idiopathic eosinophilia 04/05/2011   NONSPECIFIC ABNORMAL RESULTS LIVR FUNCTION STUDY 08/15/2008   ANEMIA, HX OF 08/15/2008    PCP: Harlene Perfect, MD  REFERRING PROVIDER: same  REFERRING DIAG: unsteady gait and balance  THERAPY DIAG:  Other abnormalities of gait and mobility  Difficulty in walking, not elsewhere classified  Muscle weakness (generalized)  Rationale for Evaluation and Treatment: Rehabilitation  ONSET DATE: 1-2 years , more decline last 6 months  SUBJECTIVE:   SUBJECTIVE STATEMENT: Pt  PERTINENT HISTORY: + for DM, h/o of arterial claudication R LE, h/o anemia, osteopenia PAIN:  Are you having pain? Yes: NPRS scale: 0 to 3 Pain location: B calves Pain description: cramping pain Aggravating factors: walking too far Relieving factors: rest  PRECAUTIONS: Fall  RED FLAGS: None   WEIGHT BEARING RESTRICTIONS: No  FALLS:  Has patient fallen in last 6 months? No  LIVING ENVIRONMENT: Lives with: lives with their family Lives in: House/apartment Stairs: bedroom upstairs flight with rail, also 1-2 steps to get into home Has following equipment at home: None  OCCUPATION: retired  PLOF: Independent  PATIENT GOALS: husband and pt want her walking to get better  NEXT MD VISIT: 07/05/23  OBJECTIVE:  Note: Objective  measures were completed at Evaluation unless otherwise noted.  DIAGNOSTIC FINDINGS: na  PATIENT SURVEYS:  ABC scale TBD  COGNITION: Overall cognitive status: Within functional limits for tasks assessed     SENSATION: WFL  EDEMA:  None noted  MUSCLE LENGTH: Hamstrings: Right -15 deg; Left -15 deg Thomas test: Right wnl deg; Left wnl deg  POSTURE: weight bearing B forefeet.  Forward flexed through lumbar and B hips  PALPATION: Non tender B plantarflexors  LOWER EXTREMITY ROM:  Active ROM Right eval Left eval   Hip flexion wfl wfl  Hip extension    Hip abduction    Hip adduction    Hip internal rotation    Hip external rotation    Knee flexion    Knee extension    Ankle dorsiflexion -10 -4  Ankle plantarflexion    Ankle inversion    Ankle eversion     (Blank rows = wfl)  LOWER EXTREMITY MMT:  MMT Right eval Left eval  Hip flexion 4- 4-  Hip extension 4- 4-  Hip abduction    Hip adduction    Hip internal rotation    Hip external rotation    Knee flexion    Knee extension    Ankle dorsiflexion 3+ 4  Ankle plantarflexion 3- 3  Ankle inversion    Ankle eversion     (Blank rows = not tested)  Coordination: decreased with alt toe tapping in sitting, more so on R  FUNCTIONAL TESTS:  30 seconds chair stand test 7 reps  Timed up and go (TUG): 23.29  Berg: 38/56  GAIT: Distance walked: 13' in clinic  Assistive device utilized: None Level of assistance: CGA Gait  with forward lean, decreased stride length R LE, festinating which increased with distance, weight bearing on B forefeet. Pt utilized therapist's arm /cart for support to walk across clinic, caught R foot and stumbled 2 x during today's session                                                                                                                                 TREATMENT DATE:  06/26/23 THERAPEUTIC EXERCISE: To improve strength, endurance, ROM, and flexibility.   Nustep L4x59min UE/LE LTR x 10 both ways Supine clams RTB 10x3 B  Gastroc stretch standing x 30 B THERAPEUTIC ACTIVITIES: To improve functional performance.  Sit to stand with chest opener x 10 Bridges 2x10  Supine trunk twist x 10 B Bird dog x 10- UE support Retro step 2 x 10 Gait with [facility] Xxx  06/23/23 Nustep L4x51min UE/LE LAQ 2lb 2 x 10 BLE NEUROMUSCULAR RE-EDUCATION: To improve coordination, kinesthesia, posture, and proprioception.  Standing heel/toe raises x 15 Standing hip abduction 2x10 Standing marching 2 x 10 Standing hip  extension 2x10 - cues to lift moving foot off of floor Fwd step over pool noodle x 10 B Standing bird dog x 10 B- cues for proper motion Gait Training attempted  to use hiking poles but unable to coordinate movements  06/21/23 Nustep L3x4min NEUROMUSCULAR RE-EDUCATION: To improve coordination, kinesthesia, posture, and proprioception.  Standing heel/toe raises x 10 Standing hip abduction x 10 Standing marching x 10 Standing hip extension 10  Standing toe taps to yoga block x 10 B Reciprocal arm reaches to targets x 10 Airex balance narrow BOS 10 secs  Added head turns x 10 06/08/23:  Evaluation, assessment of balance, functional tests, instructed the pt in the 2 ex as listed below, with emphasis on gastroc stretch combined with the hamstring stretch. Discussed assistive device briefly and pt not ready/willing to utilize.    PATIENT EDUCATION:  Education details: HEP- see below Person educated: Patient and Spouse Education method: Explanation, Demonstration, Tactile cues, Verbal cues, and Handouts Education comprehension: verbalized understanding, returned demonstration, verbal cues required, tactile cues required, and needs further education  HOME EXERCISE PROGRAM: Access Code: [email or web address] URL: [email or web address] Date: 06/21/2023 Prepared by: Sol Gaskins  Exercises - Seated Hamstring Stretch with Strap  - 2 x daily - 7 x weekly - 1 sets - 6 reps - 10sec hold - Heel Toe Raises with Counter Support  - 1 x daily - 7 x weekly - 2 sets - 10 reps - Standing Hip Extension with Counter Support  - 1 x daily - 7 x weekly - 2 sets - 10 reps - Standing Hip Abduction with Counter Support  - 1 x daily - 7 x weekly - 2 sets - 10 reps - Standing March with Counter Support  - 1 x daily - 7 x weekly - 2 sets - 10 reps  ASSESSMENT:  CLINICAL IMPRESSION: Progressed work on larger amplitude movements, balance, and hip strengthening. Pt still with trouble coordinating movement using a  walking device and very small movements w/o AD. Cues needed throughout the session to correct form and technique. Good response to treatment.   Eval: Patient is a 73 y.o. female who was evaluated today by physical therapy due to a decline in her gait and balance.  She presents as a high fall risk with her Berg score of 38/56. She demonstrates a festinating, slow gait with forward trunk flexion and weight bearing on B forefeet. She is very stiff in B ankles and demonstrates LE coordination deficits. She should benefit from skilled PT to address these deficits and hopefully improve her overall function and safety.  OBJECTIVE IMPAIRMENTS: Abnormal gait, decreased activity tolerance, decreased balance, decreased coordination, decreased endurance, difficulty walking, decreased ROM, decreased strength, impaired flexibility, postural dysfunction, and pain.   ACTIVITY LIMITATIONS: carrying, squatting, stairs, hygiene/grooming, locomotion level, and caring for others  PARTICIPATION LIMITATIONS: meal prep, cleaning, laundry, shopping, community activity, and yard work  PERSONAL FACTORS: Age, Behavior pattern, Fitness, Past/current experiences, Time since onset of injury/illness/exacerbation, and 1-2 comorbidities: h/o DM, anemia,osteopenia are also affecting patient's functional outcome.   REHAB POTENTIAL: Good  CLINICAL DECISION MAKING: Evolving/moderate complexity  EVALUATION COMPLEXITY: Moderate   GOALS: Goals reviewed with patient? Yes  SHORT TERM GOALS: Target date: 2 weeks, 06/22/23 I HEP Baseline: Goal status: 06/23/23 still need clarification   LONG TERM GOALS: Target date: 08/31/23  30 sec sit to stand improve from 7 to 12 or greater to demonstrate improved coordination and functional strength Baseline:  Goal status: INITIAL  2.  Berg score improve from 38/56 to 45/56 or higher Baseline:  Goal status: INITIAL  3.  TUG score 14 or less Baseline: 23.29 Goal status: INITIAL  4.   Able to walk over  300', household distance with LAD and modified I  Baseline: needs external support, furniture, HHA Goal status: INITIAL  5.  Assess ABC balance confidence score and set goal Baseline:  Goal status: INITIAL   PLAN:  PT FREQUENCY: 2x/week  PT DURATION: 12 weeks  PLANNED INTERVENTIONS: 97110-Therapeutic exercises, 97530- Therapeutic activity, 97112- Neuromuscular re-education, 97535- Self Care, [id number]- Manual therapy, [phone]- Gait training, and Balance training  PLAN FOR NEXT SESSION: specific stretching for B ankle dorsiflexion , standing balance and coordination ex, speed and direction changes, righting reactions   Sol LITTIE Gaskins, PTA 06/26/2023, 12:32 PM

## 2023-06-29 ENCOUNTER — Ambulatory Visit

## 2023-06-29 DIAGNOSIS — R2689 Other abnormalities of gait and mobility: Secondary | ICD-10-CM

## 2023-06-29 DIAGNOSIS — M6281 Muscle weakness (generalized): Secondary | ICD-10-CM

## 2023-06-29 DIAGNOSIS — R262 Difficulty in walking, not elsewhere classified: Secondary | ICD-10-CM

## 2023-06-29 DIAGNOSIS — R2681 Unsteadiness on feet: Secondary | ICD-10-CM | POA: Diagnosis not present

## 2023-06-29 NOTE — Therapy (Signed)
 OUTPATIENT PHYSICAL THERAPY LOWER EXTREMITY TREATMENT   Patient Name: Heidi [name] MRN: [id number] DOB:[birth year], 73 y.o., female Today's Date: 06/29/2023  END OF SESSION:  PT End of Session - 06/29/23 1114     Visit Number 5    Date for PT Re-Evaluation 08/31/23    Authorization Type [employer] M[employer]    Progress Note Due on Visit 10    PT Start Time 1105    PT Stop Time 1145    PT Time Calculation (min) 40 min    Activity Tolerance Patient tolerated treatment well    Behavior During Therapy WFL for tasks assessed/performed              Past Medical History:  Diagnosis Date   Allergic rhinitis due to pollen    Allergy    Anemia, unspecified    Blood transfusion without reported diagnosis    1979   Disorder of bone and cartilage, unspecified    Eosinophilia    Headache 09/24/2016   Lymphocytosis (symptomatic)    Osteoporosis    Other abnormal blood chemistry    Other and unspecified hyperlipidemia    Pain in joint, shoulder region    Reflux esophagitis    Type II or unspecified type diabetes mellitus without mention of complication, uncontrolled    Type II or unspecified type diabetes mellitus without mention of complication, uncontrolled    Umbilical hernia without mention of obstruction or gangrene    Unspecified disorder of liver    Past Surgical History:  Procedure Laterality Date   CATARACT EXTRACTION, BILATERAL     CESAREAN SECTION     COLONOSCOPY  2015   Dr. Debrah, normal exam   Patient Active Problem List   Diagnosis Date Noted   Impingement syndrome of left shoulder 10/09/2019   Osteopenia 10/03/2019   Memory loss 06/23/2017   Gait abnormality 06/23/2017   Uterine prolapse 09/24/2016   Headache 09/24/2016   Hyponatremia 03/03/2015   Special screening for malignant neoplasms, colon 12/10/2012   Lymphocytosis (symptomatic)    Disorder of bone and cartilage    Controlled type 2 diabetes mellitus without complication, without long-term current use  of insulin (HCC)    Reflux esophagitis    Anemia, unspecified    Dyslipidemia    Lymphocytosis 01/16/2012   Idiopathic eosinophilia 04/05/2011   NONSPECIFIC ABNORMAL RESULTS LIVR FUNCTION STUDY 08/15/2008   ANEMIA, HX OF 08/15/2008    PCP: Harlene Perfect, MD  REFERRING PROVIDER: same  REFERRING DIAG: unsteady gait and balance  THERAPY DIAG:  Other abnormalities of gait and mobility  Difficulty in walking, not elsewhere classified  Muscle weakness (generalized)  Rationale for Evaluation and Treatment: Rehabilitation  ONSET DATE: 1-2 years , more decline last 6 months  SUBJECTIVE:   SUBJECTIVE STATEMENT: Feeling good today, no pain  PERTINENT HISTORY: + for DM, h/o of arterial claudication R LE, h/o anemia, osteopenia PAIN:  Are you having pain? Yes: NPRS scale: 0 to 3 Pain location: B calves Pain description: cramping pain Aggravating factors: walking too far Relieving factors: rest  PRECAUTIONS: Fall  RED FLAGS: None   WEIGHT BEARING RESTRICTIONS: No  FALLS:  Has patient fallen in last 6 months? No  LIVING ENVIRONMENT: Lives with: lives with their family Lives in: House/apartment Stairs: bedroom upstairs flight with rail, also 1-2 steps to get into home Has following equipment at home: None  OCCUPATION: retired  PLOF: Independent  PATIENT GOALS: husband and pt want her walking to get better  NEXT MD VISIT: 07/05/23  OBJECTIVE:  Note: Objective measures were completed at Evaluation unless otherwise noted.  DIAGNOSTIC FINDINGS: na  PATIENT SURVEYS:  ABC scale TBD  COGNITION: Overall cognitive status: Within functional limits for tasks assessed     SENSATION: WFL  EDEMA:  None noted  MUSCLE LENGTH: Hamstrings: Right -15 deg; Left -15 deg Thomas test: Right wnl deg; Left wnl deg  POSTURE: weight bearing B forefeet.  Forward flexed through lumbar and B hips  PALPATION: Non tender B plantarflexors  LOWER EXTREMITY ROM:  Active ROM  Right eval Left eval  Hip flexion wfl wfl  Hip extension    Hip abduction    Hip adduction    Hip internal rotation    Hip external rotation    Knee flexion    Knee extension    Ankle dorsiflexion -10 -4  Ankle plantarflexion    Ankle inversion    Ankle eversion     (Blank rows = wfl)  LOWER EXTREMITY MMT:  MMT Right eval Left eval  Hip flexion 4- 4-  Hip extension 4- 4-  Hip abduction    Hip adduction    Hip internal rotation    Hip external rotation    Knee flexion    Knee extension    Ankle dorsiflexion 3+ 4  Ankle plantarflexion 3- 3  Ankle inversion    Ankle eversion     (Blank rows = not tested)  Coordination: decreased with alt toe tapping in sitting, more so on R  FUNCTIONAL TESTS:  30 seconds chair stand test 7 reps  Timed up and go (TUG): 23.29  Berg: 38/56  GAIT: Distance walked: 6' in clinic  Assistive device utilized: None Level of assistance: CGA Gait  with forward lean, decreased stride length R LE, festinating which increased with distance, weight bearing on B forefeet. Pt utilized therapist's arm /cart for support to walk across clinic, caught R foot and stumbled 2 x during today's session                                                                                                                                 TREATMENT DATE:  06/29/23  Nustep L5x91min UE/LE 30 seconds STS- 8 seconds Seated ankle DF RTB 2x10 B Standing toe raises unilateral x 10 B STS + chest opener x 10 Standing stepping over pool noodle 2x10 B emphasis on heel/toe pattern Retro stepping x 10 B Slider lunges - very difficult to get correct motion Squats x 10  06/26/23 THERAPEUTIC EXERCISE: To improve strength, endurance, ROM, and flexibility.   Nustep L4x38min UE/LE LTR x 10 both ways Supine clams RTB 10x3 B  Gastroc stretch standing x 30 B THERAPEUTIC ACTIVITIES: To improve functional performance.  Sit to stand with chest opener x 10 Bridges 2x10  Supine  trunk twist x 10 B Bird dog x 10- UE support Retro step 2 x 10 Gait with SBQC  06/23/23 Nustep L4x4min UE/LE LAQ 2lb  2 x 10 BLE NEUROMUSCULAR RE-EDUCATION: To improve coordination, kinesthesia, posture, and proprioception.  Standing heel/toe raises x 15 Standing hip abduction 2x10 Standing marching 2 x 10 Standing hip extension 2x10 - cues to lift moving foot off of floor Fwd step over pool noodle x 10 B Standing bird dog x 10 B- cues for proper motion Gait Training attempted to use hiking poles but unable to coordinate movements  06/21/23 Nustep L3x42min NEUROMUSCULAR RE-EDUCATION: To improve coordination, kinesthesia, posture, and proprioception.  Standing heel/toe raises x 10 Standing hip abduction x 10 Standing marching x 10 Standing hip extension 10  Standing toe taps to yoga block x 10 B Reciprocal arm reaches to targets x 10 Airex balance narrow BOS 10 secs  Added head turns x 10 06/08/23:  Evaluation, assessment of balance, functional tests, instructed the pt in the 2 ex as listed below, with emphasis on gastroc stretch combined with the hamstring stretch. Discussed assistive device briefly and pt not ready/willing to utilize.    PATIENT EDUCATION:  Education details: HEP- see below Person educated: Patient and Spouse Education method: Explanation, Demonstration, Tactile cues, Verbal cues, and Handouts Education comprehension: verbalized understanding, returned demonstration, verbal cues required, tactile cues required, and needs further education  HOME EXERCISE PROGRAM: Access Code: [email or web address] URL: [email or web address] Date: 06/21/2023 Prepared by: Sol Gaskins  Exercises - Seated Hamstring Stretch with Strap  - 2 x daily - 7 x weekly - 1 sets - 6 reps - 10sec hold - Heel Toe Raises with Counter Support  - 1 x daily - 7 x weekly - 2 sets - 10 reps - Standing Hip Extension with Counter Support  - 1 x daily - 7 x weekly - 2 sets - 10 reps - Standing Hip  Abduction with Counter Support  - 1 x daily - 7 x weekly - 2 sets - 10 reps - Standing March with Counter Support  - 1 x daily - 7 x weekly - 2 sets - 10 reps  ASSESSMENT:  CLINICAL IMPRESSION: Progressed work on functional strength and larger amplitude movement. Instructed for posture and increased steps with the stepping exercises. Cues needed throughout the session to correct form with all interventions. Good response to treatment. Her score on 30 seconds STS has improved by 1 point.   Eval: Patient is a 73 y.o. female who was evaluated today by physical therapy due to a decline in her gait and balance.  She presents as a high fall risk with her Berg score of 38/56. She demonstrates a festinating, slow gait with forward trunk flexion and weight bearing on B forefeet. She is very stiff in B ankles and demonstrates LE coordination deficits. She should benefit from skilled PT to address these deficits and hopefully improve her overall function and safety.  OBJECTIVE IMPAIRMENTS: Abnormal gait, decreased activity tolerance, decreased balance, decreased coordination, decreased endurance, difficulty walking, decreased ROM, decreased strength, impaired flexibility, postural dysfunction, and pain.   ACTIVITY LIMITATIONS: carrying, squatting, stairs, hygiene/grooming, locomotion level, and caring for others  PARTICIPATION LIMITATIONS: meal prep, cleaning, laundry, shopping, community activity, and yard work  PERSONAL FACTORS: Age, Behavior pattern, Fitness, Past/current experiences, Time since onset of injury/illness/exacerbation, and 1-2 comorbidities: h/o DM, anemia,osteopenia are also affecting patient's functional outcome.   REHAB POTENTIAL: Good  CLINICAL DECISION MAKING: Evolving/moderate complexity  EVALUATION COMPLEXITY: Moderate   GOALS: Goals reviewed with patient? Yes  SHORT TERM GOALS: Target date: 2 weeks, 06/22/23 I HEP Baseline: Goal status: 06/23/23 still need  clarification   LONG  TERM GOALS: Target date: 08/31/23  30 sec sit to stand improve from 7 to 12 or greater to demonstrate improved coordination and functional strength Baseline:  Goal status: 06/29/23- 8 seconds improved  2.  Berg score improve from 38/56 to 45/56 or higher Baseline:  Goal status: INITIAL  3.  TUG score 14 or less Baseline: 23.29 Goal status: INITIAL  4.  Able to walk over 300', household distance with LAD and modified I  Baseline: needs external support, furniture, HHA Goal status: INITIAL  5.  Assess ABC balance confidence score and set goal Baseline:  Goal status: INITIAL   PLAN:  PT FREQUENCY: 2x/week  PT DURATION: 12 weeks  PLANNED INTERVENTIONS: 97110-Therapeutic exercises, 97530- Therapeutic activity, 97112- Neuromuscular re-education, 97535- Self Care, [id number]- Manual therapy, [phone]- Gait training, and Balance training  PLAN FOR NEXT SESSION: specific stretching/strengthening for B ankle dorsiflexion ,large amplitude movements; speed and direction changes, righting reactions   Woodrow Drab L Kyriakos Babler, PTA 06/29/2023, 11:56 AM

## 2023-07-02 NOTE — Progress Notes (Unsigned)
 Heidi [name] [address], [address] [place], [place] [address] [phone]  Date:  07/05/2023   Name:  Heidi [name]   DOB:  06-04-1950   MRN:  [id number]  PCP:  Watt Harlene BROCKS, MD    Chief Complaint: No chief complaint on file.   History of Present Illness:  Heidi [name] is a 73 y.o. very pleasant female patient who presents with the following:  Patient seen today for periodic follow-up.  Most recent visit with myself was in May  History of diet-controlled diabetes, hyponatremia, anemia, adrenal insufficiency, memory loss, osteoporosis Her endocrinologist is Dr. Tommas who treats her adrenal insufficiency  At our visit in May referred her to physical therapy to work on strength and balance  Due for urine micro Due for colon cancer screening Eye exam  Diabetes currently diet control only Taking hydrocortisone  15 mg total daily Simvastatin  20 Lab Results  Component Value Date   HGBA1C 6.3 05/10/2023     Patient Active Problem List   Diagnosis Date Noted   Impingement syndrome of left shoulder 10/09/2019   Osteopenia 10/03/2019   Memory loss 06/23/2017   Gait abnormality 06/23/2017   Uterine prolapse 09/24/2016   Headache 09/24/2016   Hyponatremia 03/03/2015   Special screening for malignant neoplasms, colon 12/10/2012   Lymphocytosis (symptomatic)    Disorder of bone and cartilage    Controlled type 2 diabetes mellitus without complication, without long-term current use of insulin (HCC)    Reflux esophagitis    Anemia, unspecified    Dyslipidemia    Lymphocytosis 01/16/2012   Idiopathic eosinophilia 04/05/2011   NONSPECIFIC ABNORMAL RESULTS LIVR FUNCTION STUDY 08/15/2008   ANEMIA, HX OF 08/15/2008    Past Medical History:  Diagnosis Date   Allergic rhinitis due to pollen    Allergy    Anemia, unspecified    Blood transfusion without reported diagnosis    1979   Disorder of bone and cartilage,  unspecified    Eosinophilia    Headache 09/24/2016   Lymphocytosis (symptomatic)    Osteoporosis    Other abnormal blood chemistry    Other and unspecified hyperlipidemia    Pain in joint, shoulder region    Reflux esophagitis    Type II or unspecified type diabetes mellitus without mention of complication, uncontrolled    Type II or unspecified type diabetes mellitus without mention of complication, uncontrolled    Umbilical hernia without mention of obstruction or gangrene    Unspecified disorder of liver     Past Surgical History:  Procedure Laterality Date   CATARACT EXTRACTION, BILATERAL     CESAREAN SECTION     COLONOSCOPY  2015   Dr. Debrah, normal exam    Social History   Tobacco Use   Smoking status: Never   Smokeless tobacco: Never  Substance Use Topics   Alcohol use: No   Drug use: No    Family History  Problem Relation Age of Onset   Pancreatic cancer Neg Hx    Colon cancer Neg Hx    Esophageal cancer Neg Hx    Stomach cancer Neg Hx     Allergies  Allergen Reactions   Ciprofloxacin Anaphylaxis    Patient states she gets CDIFF Family states this is not correct and this is not an allergy   Alendronate Sodium     Other Reaction(s): Unknown    Medication list has been reviewed and updated.  Current Outpatient Medications on  File Prior to Visit  Medication Sig Dispense Refill   calcium -vitamin D  (OSCAL WITH D) 500-200 MG-UNIT per tablet Take 1 tablet by mouth daily.     CEQUA  0.09 % SOLN Apply 1 drop to eye 2 (two) times daily. 180 each 3   cetirizine (ZYRTEC) 10 MG chewable tablet Chew 10 mg by mouth daily.     hydrocortisone  (CORTEF ) 10 MG tablet Take 1 tablet (10 mg total) by mouth 2 (two) times daily. (Patient taking differently: Take 10 mg by mouth 2 (two) times daily. One tablet (10 mg) in morning and one half (5mg ) at night) 30 tablet 0   Multiple Vitamin (MULTIVITAMIN) tablet Take 1 tablet by mouth daily.     simvastatin  (ZOCOR ) 20 MG tablet  Take 1 tablet (20 mg total) by mouth at bedtime. 90 tablet 3   No current facility-administered medications on file prior to visit.    Review of Systems:  As per HPI- otherwise negative.   Physical Examination: There were no vitals filed for this visit. There were no vitals filed for this visit. There is no height or weight on file to calculate BMI. Ideal Body Weight:    GEN: no acute distress. HEENT: Atraumatic, Normocephalic.  Ears and Nose: No external deformity. CV: RRR, No M/G/R. No JVD. No thrill. No extra heart sounds. PULM: CTA B, no wheezes, crackles, rhonchi. No retractions. No resp. distress. No accessory muscle use. ABD: S, NT, ND, +BS. No rebound. No HSM. EXTR: No c/c/e PSYCH: Normally interactive. Conversant.    Assessment and Plan: ***  Signed Harlene Schroeder, MD

## 2023-07-04 ENCOUNTER — Other Ambulatory Visit: Payer: Self-pay

## 2023-07-04 ENCOUNTER — Ambulatory Visit: Attending: Family Medicine

## 2023-07-04 DIAGNOSIS — R262 Difficulty in walking, not elsewhere classified: Secondary | ICD-10-CM | POA: Diagnosis not present

## 2023-07-04 DIAGNOSIS — R2689 Other abnormalities of gait and mobility: Secondary | ICD-10-CM | POA: Insufficient documentation

## 2023-07-04 DIAGNOSIS — M6281 Muscle weakness (generalized): Secondary | ICD-10-CM | POA: Diagnosis not present

## 2023-07-04 NOTE — Therapy (Signed)
 OUTPATIENT PHYSICAL THERAPY LOWER EXTREMITY TREATMENT   Patient Name: Heidi [name] MRN: [id number] DOB:[birth year], 73 y.o., female Today's Date: 07/04/2023  END OF SESSION:  PT End of Session - 07/04/23 1402     Visit Number 6    Date for PT Re-Evaluation 08/31/23    Authorization Type [employer] MCR    Progress Note Due on Visit 10    PT Start Time 1402    PT Stop Time 1445    PT Time Calculation (min) 43 min    Activity Tolerance Patient tolerated treatment well    Behavior During Therapy WFL for tasks assessed/performed               Past Medical History:  Diagnosis Date   Allergic rhinitis due to pollen    Allergy    Anemia, unspecified    Blood transfusion without reported diagnosis    1979   Disorder of bone and cartilage, unspecified    Eosinophilia    Headache 09/24/2016   Lymphocytosis (symptomatic)    Osteoporosis    Other abnormal blood chemistry    Other and unspecified hyperlipidemia    Pain in joint, shoulder region    Reflux esophagitis    Type II or unspecified type diabetes mellitus without mention of complication, uncontrolled    Type II or unspecified type diabetes mellitus without mention of complication, uncontrolled    Umbilical hernia without mention of obstruction or gangrene    Unspecified disorder of liver    Past Surgical History:  Procedure Laterality Date   CATARACT EXTRACTION, BILATERAL     CESAREAN SECTION     COLONOSCOPY  2015   Dr. Debrah, normal exam   Patient Active Problem List   Diagnosis Date Noted   Impingement syndrome of left shoulder 10/09/2019   Osteopenia 10/03/2019   Memory loss 06/23/2017   Gait abnormality 06/23/2017   Uterine prolapse 09/24/2016   Headache 09/24/2016   Hyponatremia 03/03/2015   Special screening for malignant neoplasms, colon 12/10/2012   Lymphocytosis (symptomatic)    Disorder of bone and cartilage    Controlled type 2 diabetes mellitus without complication, without long-term current use  of insulin (HCC)    Reflux esophagitis    Anemia, unspecified    Dyslipidemia    Lymphocytosis 01/16/2012   Idiopathic eosinophilia 04/05/2011   NONSPECIFIC ABNORMAL RESULTS LIVR FUNCTION STUDY 08/15/2008   ANEMIA, HX OF 08/15/2008    PCP: Harlene Perfect, MD  REFERRING PROVIDER: same  REFERRING DIAG: unsteady gait and balance  THERAPY DIAG:  Other abnormalities of gait and mobility  Difficulty in walking, not elsewhere classified  Muscle weakness (generalized)  Rationale for Evaluation and Treatment: Rehabilitation  ONSET DATE: 1-2 years , more decline last 6 months  SUBJECTIVE:   SUBJECTIVE STATEMENT:   PERTINENT HISTORY: + for DM, h/o of arterial claudication R LE, h/o anemia, osteopenia PAIN:  Are you having pain? Yes: NPRS scale: 0 to 3 Pain location: B calves Pain description: cramping pain Aggravating factors: walking too far Relieving factors: rest  PRECAUTIONS: Fall  RED FLAGS: None   WEIGHT BEARING RESTRICTIONS: No  FALLS:  Has patient fallen in last 6 months? No  LIVING ENVIRONMENT: Lives with: lives with their family Lives in: House/apartment Stairs: bedroom upstairs flight with rail, also 1-2 steps to get into home Has following equipment at home: None  OCCUPATION: retired  PLOF: Independent  PATIENT GOALS: husband and pt want her walking to get better  NEXT MD VISIT: 07/05/23  OBJECTIVE:  Note: Objective measures were completed at Evaluation unless otherwise noted.  DIAGNOSTIC FINDINGS: na  PATIENT SURVEYS:  ABC scale TBD  COGNITION: Overall cognitive status: Within functional limits for tasks assessed     SENSATION: WFL  EDEMA:  None noted  MUSCLE LENGTH: Hamstrings: Right -15 deg; Left -15 deg Thomas test: Right wnl deg; Left wnl deg  POSTURE: weight bearing B forefeet.  Forward flexed through lumbar and B hips  PALPATION: Non tender B plantarflexors  LOWER EXTREMITY ROM:  Active ROM Right eval Left eval   Hip flexion wfl wfl  Hip extension    Hip abduction    Hip adduction    Hip internal rotation    Hip external rotation    Knee flexion    Knee extension    Ankle dorsiflexion -10 -4  Ankle plantarflexion    Ankle inversion    Ankle eversion     (Blank rows = wfl)  LOWER EXTREMITY MMT:  MMT Right eval Left eval  Hip flexion 4- 4-  Hip extension 4- 4-  Hip abduction    Hip adduction    Hip internal rotation    Hip external rotation    Knee flexion    Knee extension    Ankle dorsiflexion 3+ 4  Ankle plantarflexion 3- 3  Ankle inversion    Ankle eversion     (Blank rows = not tested)  Coordination: decreased with alt toe tapping in sitting, more so on R  FUNCTIONAL TESTS:  30 seconds chair stand test 7 reps  Timed up and go (TUG): 23.29  Berg: 38/56  GAIT: Distance walked: 74' in clinic  Assistive device utilized: None Level of assistance: CGA Gait  with forward lean, decreased stride length R LE, festinating which increased with distance, weight bearing on B forefeet. Pt utilized therapist's arm /cart for support to walk across clinic, caught R foot and stumbled 2 x during today's session                                                                                                                                 TREATMENT DATE:  07/04/23:  Nustep level 5, 6 min Supine for manual stretching R ankle, with AP glides R subtalar jt Standing for heel drop R off 2 step for R calf stretch mass practice Gait 300' in hallway, needed CGA 5 x due to catching R toe and festinating type gait pattern High marching along counter , cues to emphasize R LE Alt toe taps to 2 block, without UE support  06/29/23  Nustep L5x15min UE/LE 30 seconds STS- 8 seconds Seated ankle DF RTB 2x10 B Standing toe raises unilateral x 10 B STS + chest opener x 10 Standing stepping over pool noodle 2x10 B emphasis on heel/toe pattern Retro stepping x 10 B Slider lunges - very difficult to  get correct motion Squats x 10  06/26/23 THERAPEUTIC EXERCISE: To improve strength, endurance, ROM,  and flexibility.   Nustep L4x12min UE/LE LTR x 10 both ways Supine clams RTB 10x3 B  Gastroc stretch standing x 30 B THERAPEUTIC ACTIVITIES: To improve functional performance.  Sit to stand with chest opener x 10 Bridges 2x10  Supine trunk twist x 10 B Bird dog x 10- UE support Retro step 2 x 10 Gait with [facility]  06/23/23 Nustep L4x70min UE/LE LAQ 2lb 2 x 10 BLE NEUROMUSCULAR RE-EDUCATION: To improve coordination, kinesthesia, posture, and proprioception.  Standing heel/toe raises x 15 Standing hip abduction 2x10 Standing marching 2 x 10 Standing hip extension 2x10 - cues to lift moving foot off of floor Fwd step over pool noodle x 10 B Standing bird dog x 10 B- cues for proper motion Gait Training attempted to use hiking poles but unable to coordinate movements  06/21/23 Nustep L3x34min NEUROMUSCULAR RE-EDUCATION: To improve coordination, kinesthesia, posture, and proprioception.  Standing heel/toe raises x 10 Standing hip abduction x 10 Standing marching x 10 Standing hip extension 10  Standing toe taps to yoga block x 10 B Reciprocal arm reaches to targets x 10 Airex balance narrow BOS 10 secs  Added head turns x 10 06/08/23:  Evaluation, assessment of balance, functional tests, instructed the pt in the 2 ex as listed below, with emphasis on gastroc stretch combined with the hamstring stretch. Discussed assistive device briefly and pt not ready/willing to utilize.    PATIENT EDUCATION:  Education details: HEP- see below Person educated: Patient and Spouse Education method: Explanation, Demonstration, Tactile cues, Verbal cues, and Handouts Education comprehension: verbalized understanding, returned demonstration, verbal cues required, tactile cues required, and needs further education  HOME EXERCISE PROGRAM: Access Code: [email or web address] URL:  [email or web address] Date: 06/21/2023 Prepared by: Sol Gaskins  Exercises - Seated Hamstring Stretch with Strap  - 2 x daily - 7 x weekly - 1 sets - 6 reps - 10sec hold - Heel Toe Raises with Counter Support  - 1 x daily - 7 x weekly - 2 sets - 10 reps - Standing Hip Extension with Counter Support  - 1 x daily - 7 x weekly - 2 sets - 10 reps - Standing Hip Abduction with Counter Support  - 1 x daily - 7 x weekly - 2 sets - 10 reps - Standing March with Counter Support  - 1 x daily - 7 x weekly - 2 sets - 10 reps  ASSESSMENT:  CLINICAL IMPRESSION: 07/04/23: today was the pts 6th physical therapy session to address her gait pattern.  Reassessed ROM  ankles, L improved for dorsiflexion to neutral, R -8.  Noted with gait the patient walks on B forefeet but tends to circumduct R LE and frequently catches R toe and stumbles.  Did contact with in basket message today the referring MD, Dr Ubaldo and requested order for AFO. Will continue to address her safety and balance.   Eval: Patient is a 73 y.o. female who was evaluated today by physical therapy due to a decline in her gait and balance.  She presents as a high fall risk with her Berg score of 38/56. She demonstrates a festinating, slow gait with forward trunk flexion and weight bearing on B forefeet. She is very stiff in B ankles and demonstrates LE coordination deficits. She should benefit from skilled PT to address these deficits and hopefully improve her overall function and safety.  OBJECTIVE IMPAIRMENTS: Abnormal gait, decreased activity tolerance, decreased balance, decreased coordination, decreased endurance, difficulty walking, decreased ROM, decreased strength, impaired flexibility, postural dysfunction,  and pain.   ACTIVITY LIMITATIONS: carrying, squatting, stairs, hygiene/grooming, locomotion level, and caring for others  PARTICIPATION LIMITATIONS: meal prep, cleaning, laundry, shopping, community activity, and yard  work  PERSONAL FACTORS: Age, Behavior pattern, Fitness, Past/current experiences, Time since onset of injury/illness/exacerbation, and 1-2 comorbidities: h/o DM, anemia,osteopenia are also affecting patient's functional outcome.   REHAB POTENTIAL: Good  CLINICAL DECISION MAKING: Evolving/moderate complexity  EVALUATION COMPLEXITY: Moderate   GOALS: Goals reviewed with patient? Yes  SHORT TERM GOALS: Target date: 2 weeks, 06/22/23 I HEP Baseline: Goal status: 06/23/23 still need clarification   LONG TERM GOALS: Target date: 08/31/23  30 sec sit to stand improve from 7 to 12 or greater to demonstrate improved coordination and functional strength Baseline:  Goal status: 06/29/23- 8 seconds improved  2.  Berg score improve from 38/56 to 45/56 or higher Baseline:  Goal status: INITIAL  3.  TUG score 14 or less Baseline: 23.29 Goal status: INITIAL  4.  Able to walk over 300', household distance with LAD and modified I  Baseline: needs external support, furniture, HHA Goal status: INITIAL  5.  Assess ABC balance confidence score and set goal Baseline:  Goal status: INITIAL   PLAN:  PT FREQUENCY: 2x/week  PT DURATION: 12 weeks  PLANNED INTERVENTIONS: 97110-Therapeutic exercises, 97530- Therapeutic activity, 97112- Neuromuscular re-education, 97535- Self Care, 02859- Manual therapy, 97116- Gait training, and Balance training  PLAN FOR NEXT SESSION: specific stretching/strengthening for B ankle dorsiflexion ,large amplitude movements; speed and direction changes, righting reactions   Stefania Goulart L Phyllicia Dudek, PT, DPT, OCS 07/04/2023, 4:18 PM

## 2023-07-05 ENCOUNTER — Ambulatory Visit: Admitting: Family Medicine

## 2023-07-05 ENCOUNTER — Encounter: Payer: Self-pay | Admitting: Family Medicine

## 2023-07-05 VITALS — BP 126/78 | HR 68 | Ht 61.0 in | Wt 169.0 lb

## 2023-07-05 DIAGNOSIS — R2681 Unsteadiness on feet: Secondary | ICD-10-CM | POA: Diagnosis not present

## 2023-07-05 DIAGNOSIS — E119 Type 2 diabetes mellitus without complications: Secondary | ICD-10-CM

## 2023-07-05 DIAGNOSIS — M21371 Foot drop, right foot: Secondary | ICD-10-CM

## 2023-07-05 NOTE — Patient Instructions (Signed)
 Please call the [facility] and set up an appointment for an AFO to help with your walking. I gave you an order for this and will send over notes for you  Address: [address] [place], [place], [place] [address] Phone: [phone]

## 2023-07-11 ENCOUNTER — Ambulatory Visit: Admitting: Rehabilitation

## 2023-07-11 ENCOUNTER — Encounter: Payer: Self-pay | Admitting: Rehabilitation

## 2023-07-11 DIAGNOSIS — R262 Difficulty in walking, not elsewhere classified: Secondary | ICD-10-CM | POA: Diagnosis not present

## 2023-07-11 DIAGNOSIS — M6281 Muscle weakness (generalized): Secondary | ICD-10-CM | POA: Diagnosis not present

## 2023-07-11 DIAGNOSIS — R2689 Other abnormalities of gait and mobility: Secondary | ICD-10-CM

## 2023-07-11 NOTE — Therapy (Signed)
 OUTPATIENT PHYSICAL THERAPY LOWER EXTREMITY TREATMENT   Patient Name: Heidi [name] MRN: [id number] DOB:1950-11-15, 73 y.o., female Today's Date: 07/11/2023  END OF SESSION:  PT End of Session - 07/11/23 1200     Visit Number 7    Date for PT Re-Evaluation 08/31/23    Authorization Type [employer] MCR    Progress Note Due on Visit 10    PT Start Time 1153    PT Stop Time 1236    PT Time Calculation (min) 43 min    Activity Tolerance Patient tolerated treatment well    Behavior During Therapy WFL for tasks assessed/performed                Past Medical History:  Diagnosis Date   Allergic rhinitis due to pollen    Allergy    Anemia, unspecified    Blood transfusion without reported diagnosis    1979   Disorder of bone and cartilage, unspecified    Eosinophilia    Headache 09/24/2016   Lymphocytosis (symptomatic)    Osteoporosis    Other abnormal blood chemistry    Other and unspecified hyperlipidemia    Pain in joint, shoulder region    Reflux esophagitis    Type II or unspecified type diabetes mellitus without mention of complication, uncontrolled    Type II or unspecified type diabetes mellitus without mention of complication, uncontrolled    Umbilical hernia without mention of obstruction or gangrene    Unspecified disorder of liver    Past Surgical History:  Procedure Laterality Date   CATARACT EXTRACTION, BILATERAL     CESAREAN SECTION     COLONOSCOPY  2015   Dr. Debrah, normal exam   Patient Active Problem List   Diagnosis Date Noted   Impingement syndrome of left shoulder 10/09/2019   Osteopenia 10/03/2019   Memory loss 06/23/2017   Gait abnormality 06/23/2017   Uterine prolapse 09/24/2016   Headache 09/24/2016   Hyponatremia 03/03/2015   Special screening for malignant neoplasms, colon 12/10/2012   Lymphocytosis (symptomatic)    Disorder of bone and cartilage    Controlled type 2 diabetes mellitus without complication, without long-term current  use of insulin (HCC)    Reflux esophagitis    Anemia, unspecified    Dyslipidemia    Lymphocytosis 01/16/2012   Idiopathic eosinophilia 04/05/2011   NONSPECIFIC ABNORMAL RESULTS LIVR FUNCTION STUDY 08/15/2008   ANEMIA, HX OF 08/15/2008    PCP: Harlene Perfect, MD  REFERRING PROVIDER: same  REFERRING DIAG: unsteady gait and balance  THERAPY DIAG:  Difficulty in walking, not elsewhere classified  Other abnormalities of gait and mobility  Muscle weakness (generalized)  Rationale for Evaluation and Treatment: Rehabilitation  ONSET DATE: 1-2 years , more decline last 6 months  SUBJECTIVE:   SUBJECTIVE STATEMENT: Patient states feels ok.   Denies any pain.  States has been doing her home exercises.   Denies any falls.   She is ready for PT and very motivated and cooperative.  PERTINENT HISTORY: + for DM, h/o of arterial claudication R LE, h/o anemia, osteopenia PAIN:  Are you having pain? Yes: NPRS scale: 0 denies any pain Pain location: B calves Pain description: cramping pain Aggravating factors: walking too far Relieving factors: rest  PRECAUTIONS: Fall  RED FLAGS: None   WEIGHT BEARING RESTRICTIONS: No  FALLS:  Has patient fallen in last 6 months? No  LIVING ENVIRONMENT: Lives with: lives with their family Lives in: House/apartment Stairs: bedroom upstairs flight with rail, also 1-2 steps to  get into home Has following equipment at home: None  OCCUPATION: retired  PLOF: Independent  PATIENT GOALS: husband and pt want her walking to get better  NEXT MD VISIT: 07/05/23  OBJECTIVE:  Note: Objective measures were completed at Evaluation unless otherwise noted.  DIAGNOSTIC FINDINGS: na  PATIENT SURVEYS:  ABC scale TBD  COGNITION: Overall cognitive status: Within functional limits for tasks assessed     SENSATION: WFL  EDEMA:  None noted  MUSCLE LENGTH: Hamstrings: Right -15 deg; Left -15 deg Thomas test: Right wnl deg; Left wnl  deg  POSTURE: weight bearing B forefeet.  Forward flexed through lumbar and B hips  PALPATION: Non tender B plantarflexors  LOWER EXTREMITY ROM:  Active ROM Right eval Left eval  Hip flexion wfl wfl  Hip extension    Hip abduction    Hip adduction    Hip internal rotation    Hip external rotation    Knee flexion    Knee extension    Ankle dorsiflexion -10 -4  Ankle plantarflexion    Ankle inversion    Ankle eversion     (Blank rows = wfl)  LOWER EXTREMITY MMT:  MMT Right eval Left eval  Hip flexion 4- 4-  Hip extension 4- 4-  Hip abduction    Hip adduction    Hip internal rotation    Hip external rotation    Knee flexion    Knee extension    Ankle dorsiflexion 3+ 4  Ankle plantarflexion 3- 3  Ankle inversion    Ankle eversion     (Blank rows = not tested)  Coordination: decreased with alt toe tapping in sitting, more so on R  FUNCTIONAL TESTS:  30 seconds chair stand test 7 reps  Timed up and go (TUG): 23.29  Berg: 38/56  GAIT: Distance walked: 95' in clinic  Assistive device utilized: None Level of assistance: CGA Gait  with forward lean, decreased stride length R LE, festinating which increased with distance, weight bearing on B forefeet. Pt utilized therapist's arm /cart for support to walk across clinic, caught R foot and stumbled 2 x during today's session                                                                                                                                 TREATMENT DATE:  07/11/23 Nu Step L5 x 6' Standing Step hang DF stretching x 1' x 2 BLE Standing Toe prop DF stretch x 1' x 2 BLE Step ups with high march x 10 BLE Heel/toe rocking x 20 BLE no hands Heel rocking x 20 at counter with manual assist Step touch x 2/10 BLE Heel to toe slides with foot on pillow case x 10 BLE Standing pelvic circles CW/CCW with manual facilitation for weight shift and correctr movement Counter lunges x 10 BLE Seated ankle DF x 30  BLE Gait x 1 lap in gym with cueing for consistent heel  strike each step  07/04/23:  Nustep level 5, 6 min Supine for manual stretching R ankle, with AP glides R subtalar jt Standing for heel drop R off 2 step for R calf stretch mass practice Gait 300' in hallway, needed CGA 5 x due to catching R toe and festinating type gait pattern High marching along counter , cues to emphasize R LE Alt toe taps to 2 block, without UE support  06/29/23  Nustep L5x10min UE/LE 30 seconds STS- 8 seconds Seated ankle DF RTB 2x10 B Standing toe raises unilateral x 10 B STS + chest opener x 10 Standing stepping over pool noodle 2x10 B emphasis on heel/toe pattern Retro stepping x 10 B Slider lunges - very difficult to get correct motion Squats x 10  06/26/23 THERAPEUTIC EXERCISE: To improve strength, endurance, ROM, and flexibility.   Nustep L4x34min UE/LE LTR x 10 both ways Supine clams RTB 10x3 B  Gastroc stretch standing x 30 B THERAPEUTIC ACTIVITIES: To improve functional performance.  Sit to stand with chest opener x 10 Bridges 2x10  Supine trunk twist x 10 B Bird dog x 10- UE support Retro step 2 x 10 Gait with [facility]  06/23/23 Nustep L4x53min UE/LE LAQ 2lb 2 x 10 BLE NEUROMUSCULAR RE-EDUCATION: To improve coordination, kinesthesia, posture, and proprioception.  Standing heel/toe raises x 15 Standing hip abduction 2x10 Standing marching 2 x 10 Standing hip extension 2x10 - cues to lift moving foot off of floor Fwd step over pool noodle x 10 B Standing bird dog x 10 B- cues for proper motion Gait Training attempted to use hiking poles but unable to coordinate movements  06/21/23 Nustep L3x89min NEUROMUSCULAR RE-EDUCATION: To improve coordination, kinesthesia, posture, and proprioception.  Standing heel/toe raises x 10 Standing hip abduction x 10 Standing marching x 10 Standing hip extension 10  Standing toe taps to yoga block x 10 B Reciprocal arm reaches to targets x 10 Airex  balance narrow BOS 10 secs  Added head turns x 10 06/08/23:  Evaluation, assessment of balance, functional tests, instructed the pt in the 2 ex as listed below, with emphasis on gastroc stretch combined with the hamstring stretch. Discussed assistive device briefly and pt not ready/willing to utilize.    PATIENT EDUCATION:  Education details: HEP- see below Person educated: Patient and Spouse Education method: Explanation, Demonstration, Tactile cues, Verbal cues, and Handouts Education comprehension: verbalized understanding, returned demonstration, verbal cues required, tactile cues required, and needs further education  HOME EXERCISE PROGRAM: Access Code: [email or web address] URL: [email or web address] Date: 06/21/2023 Prepared by: Sol Gaskins  Exercises - Seated Hamstring Stretch with Strap  - 2 x daily - 7 x weekly - 1 sets - 6 reps - 10sec hold - Heel Toe Raises with Counter Support  - 1 x daily - 7 x weekly - 2 sets - 10 reps - Standing Hip Extension with Counter Support  - 1 x daily - 7 x weekly - 2 sets - 10 reps - Standing Hip Abduction with Counter Support  - 1 x daily - 7 x weekly - 2 sets - 10 reps - Standing March with Counter Support  - 1 x daily - 7 x weekly - 2 sets - 10 reps  ASSESSMENT:  CLINICAL IMPRESSION: Patient continues with festinating like gait pattern catching toes R >L on swing follow thru with easy losses of balance.  Showed spouse a shoe strap AFO to purchase on [employer] and they will look into this.   Also discussed hot glueing a  piece of felt on the toes of her tennis shoes (old pair) to see if this will help prevent the toe catching.  Spouse needs to be present in the gym for treatment so he can be educated on all exercises.   He stayed in lobby today, but I don't think patient has good recall  Eval: Patient is a 73 y.o. female who was evaluated today by physical therapy due to a decline in her gait and balance.  She presents as a high fall risk with  her Berg score of 38/56. She demonstrates a festinating, slow gait with forward trunk flexion and weight bearing on B forefeet. She is very stiff in B ankles and demonstrates LE coordination deficits. She should benefit from skilled PT to address these deficits and hopefully improve her overall function and safety.  OBJECTIVE IMPAIRMENTS: Abnormal gait, decreased activity tolerance, decreased balance, decreased coordination, decreased endurance, difficulty walking, decreased ROM, decreased strength, impaired flexibility, postural dysfunction, and pain.   ACTIVITY LIMITATIONS: carrying, squatting, stairs, hygiene/grooming, locomotion level, and caring for others  PARTICIPATION LIMITATIONS: meal prep, cleaning, laundry, shopping, community activity, and yard work  PERSONAL FACTORS: Age, Behavior pattern, Fitness, Past/current experiences, Time since onset of injury/illness/exacerbation, and 1-2 comorbidities: h/o DM, anemia,osteopenia are also affecting patient's functional outcome.   REHAB POTENTIAL: Good  CLINICAL DECISION MAKING: Evolving/moderate complexity  EVALUATION COMPLEXITY: Moderate   GOALS: Goals reviewed with patient? Yes  SHORT TERM GOALS: Target date: 2 weeks, 06/22/23 I HEP Baseline: Goal status: 06/23/23 still need clarification   LONG TERM GOALS: Target date: 08/31/23  30 sec sit to stand improve from 7 to 12 or greater to demonstrate improved coordination and functional strength Baseline:  Goal status: 06/29/23- 8 seconds improved  2.  Berg score improve from 38/56 to 45/56 or higher Baseline:  Goal status: INITIAL  3.  TUG score 14 or less Baseline: 23.29 Goal status: INITIAL  4.  Able to walk over 300', household distance with LAD and modified I  Baseline: needs external support, furniture, HHA Goal status: INITIAL  5.  Assess ABC balance confidence score and set goal Baseline:  Goal status: INITIAL   PLAN:  PT FREQUENCY: 2x/week  PT DURATION: 12  weeks  PLANNED INTERVENTIONS: 97110-Therapeutic exercises, 97530- Therapeutic activity, 97112- Neuromuscular re-education, 97535- Self Care, 02859- Manual therapy, [phone]- Gait training, and Balance training  PLAN FOR NEXT SESSION: continue stretching/strengthening for B ankle dorsiflexion ,large amplitude movements; speed and direction changes, righting reactions, try ace wrap AFO   Rian Koon, PT, DPT, OCS 07/11/2023, 12:57 PM

## 2023-07-14 ENCOUNTER — Encounter

## 2023-07-18 ENCOUNTER — Other Ambulatory Visit: Payer: Self-pay

## 2023-07-18 ENCOUNTER — Ambulatory Visit

## 2023-07-18 DIAGNOSIS — R262 Difficulty in walking, not elsewhere classified: Secondary | ICD-10-CM

## 2023-07-18 DIAGNOSIS — M6281 Muscle weakness (generalized): Secondary | ICD-10-CM | POA: Diagnosis not present

## 2023-07-18 DIAGNOSIS — R2689 Other abnormalities of gait and mobility: Secondary | ICD-10-CM

## 2023-07-18 NOTE — Therapy (Signed)
 OUTPATIENT PHYSICAL THERAPY LOWER EXTREMITY TREATMENT   Patient Name: Deni Berti MRN: [id number] DOB:Aug 11, 1950, 73 y.o., female Today's Date: 07/18/2023  END OF SESSION:  PT End of Session - 07/18/23 1123     Visit Number 8    Number of Visits 11    Date for PT Re-Evaluation 08/31/23    Authorization Type [employer] MCR    Progress Note Due on Visit 10    PT Start Time 1100    PT Stop Time 1148    PT Time Calculation (min) 48 min                 Past Medical History:  Diagnosis Date   Allergic rhinitis due to pollen    Allergy    Anemia, unspecified    Blood transfusion without reported diagnosis    1979   Disorder of bone and cartilage, unspecified    Eosinophilia    Headache 09/24/2016   Lymphocytosis (symptomatic)    Osteoporosis    Other abnormal blood chemistry    Other and unspecified hyperlipidemia    Pain in joint, shoulder region    Reflux esophagitis    Type II or unspecified type diabetes mellitus without mention of complication, uncontrolled    Type II or unspecified type diabetes mellitus without mention of complication, uncontrolled    Umbilical hernia without mention of obstruction or gangrene    Unspecified disorder of liver    Past Surgical History:  Procedure Laterality Date   CATARACT EXTRACTION, BILATERAL     CESAREAN SECTION     COLONOSCOPY  2015   Dr. Debrah, normal exam   Patient Active Problem List   Diagnosis Date Noted   Impingement syndrome of left shoulder 10/09/2019   Osteopenia 10/03/2019   Memory loss 06/23/2017   Gait abnormality 06/23/2017   Uterine prolapse 09/24/2016   Headache 09/24/2016   Hyponatremia 03/03/2015   Special screening for malignant neoplasms, colon 12/10/2012   Lymphocytosis (symptomatic)    Disorder of bone and cartilage    Controlled type 2 diabetes mellitus without complication, without long-term current use of insulin (HCC)    Reflux esophagitis    Anemia, unspecified    Dyslipidemia     Lymphocytosis 01/16/2012   Idiopathic eosinophilia 04/05/2011   NONSPECIFIC ABNORMAL RESULTS LIVR FUNCTION STUDY 08/15/2008   ANEMIA, HX OF 08/15/2008    PCP: Harlene Perfect, MD  REFERRING PROVIDER: same  REFERRING DIAG: unsteady gait and balance  THERAPY DIAG:  Other abnormalities of gait and mobility  Difficulty in walking, not elsewhere classified  Muscle weakness (generalized)  Rationale for Evaluation and Treatment: Rehabilitation  ONSET DATE: 1-2 years , more decline last 6 months  SUBJECTIVE:   SUBJECTIVE STATEMENT: She and her husband obtained 2 dorsiflexion assist straps from [employer].  Want to make sure that they are using it correctly   PERTINENT HISTORY: + for DM, h/o of arterial claudication R LE, h/o anemia, osteopenia PAIN:  Are you having pain? Yes: NPRS scale: 0 denies any pain Pain location: B calves Pain description: cramping pain Aggravating factors: walking too far Relieving factors: rest  PRECAUTIONS: Fall  RED FLAGS: None   WEIGHT BEARING RESTRICTIONS: No  FALLS:  Has patient fallen in last 6 months? No  LIVING ENVIRONMENT: Lives with: lives with their family Lives in: House/apartment Stairs: bedroom upstairs flight with rail, also 1-2 steps to get into home Has following equipment at home: None  OCCUPATION: retired  PLOF: Independent  PATIENT GOALS: husband and pt want  her walking to get better  NEXT MD VISIT: 07/05/23  OBJECTIVE:  Note: Objective measures were completed at Evaluation unless otherwise noted.  DIAGNOSTIC FINDINGS: na  PATIENT SURVEYS:  ABC scale TBD  COGNITION: Overall cognitive status: Within functional limits for tasks assessed     SENSATION: WFL  EDEMA:  None noted  MUSCLE LENGTH: Hamstrings: Right -15 deg; Left -15 deg Thomas test: Right wnl deg; Left wnl deg  POSTURE: weight bearing B forefeet.  Forward flexed through lumbar and B hips  PALPATION: Non tender B plantarflexors  LOWER  EXTREMITY ROM:  Active ROM Right eval Left eval  Hip flexion wfl wfl  Hip extension    Hip abduction    Hip adduction    Hip internal rotation    Hip external rotation    Knee flexion    Knee extension    Ankle dorsiflexion -10 -4  Ankle plantarflexion    Ankle inversion    Ankle eversion     (Blank rows = wfl)  LOWER EXTREMITY MMT:  MMT Right eval Left eval  Hip flexion 4- 4-  Hip extension 4- 4-  Hip abduction    Hip adduction    Hip internal rotation    Hip external rotation    Knee flexion    Knee extension    Ankle dorsiflexion 3+ 4  Ankle plantarflexion 3- 3  Ankle inversion    Ankle eversion     (Blank rows = not tested)  Coordination: decreased with alt toe tapping in sitting, more so on R  FUNCTIONAL TESTS:  30 seconds chair stand test 7 reps  Timed up and go (TUG): 23.29  Berg: 38/56  GAIT: Distance walked: 26' in clinic  Assistive device utilized: None Level of assistance: CGA Gait  with forward lean, decreased stride length R LE, festinating which increased with distance, weight bearing on B forefeet. Pt utilized therapist's arm /cart for support to walk across clinic, caught R foot and stumbled 2 x during today's session                                                                                                                                 TREATMENT DATE:  07/18/23:  Spent initial part of session applying and adjusting first  dorsiflexion assist strap for R shoe/ankle . Walked with the strap intact and she does have noticeably less R foot drop.  She continued to move into a festinating type pattern B even with the strap when she became fatigued or increased gait speed. She had 2 straps, one with plastic release and one with velcro.  The velcro was a better/tighter fit and much improved gait  Nustep level 6 x Ue's and LE's Standing at ladder for elongated step forward with 2 targets on floor Marching, high marches, 2 x 10'  lost  amplitude R LE, fatigued quickly Backward walking with HHA x 20' x 2 Forward stepping with  HHA over 1/2 roll, mass practice Lateral stepping at ladder over 1/2 roll, 20x  07/11/23 Nu Step L5 x 6' Standing Step hang DF stretching x 1' x 2 BLE Standing Toe prop DF stretch x 1' x 2 BLE Step ups with high march x 10 BLE Heel/toe rocking x 20 BLE no hands Heel rocking x 20 at counter with manual assist Step touch x 2/10 BLE Heel to toe slides with foot on pillow case x 10 BLE Standing pelvic circles CW/CCW with manual facilitation for weight shift and correctr movement Counter lunges x 10 BLE Seated ankle DF x 30 BLE Gait x 1 lap in gym with cueing for consistent heel strike each step  07/04/23:  Nustep level 5, 6 min Supine for manual stretching R ankle, with AP glides R subtalar jt Standing for heel drop R off 2 step for R calf stretch mass practice Gait 300' in hallway, needed CGA 5 x due to catching R toe and festinating type gait pattern High marching along counter , cues to emphasize R LE Alt toe taps to 2 block, without UE support  06/29/23  Nustep L5x51min UE/LE 30 seconds STS- 8 seconds Seated ankle DF RTB 2x10 B Standing toe raises unilateral x 10 B STS + chest opener x 10 Standing stepping over pool noodle 2x10 B emphasis on heel/toe pattern Retro stepping x 10 B Slider lunges - very difficult to get correct motion Squats x 10  06/26/23 THERAPEUTIC EXERCISE: To improve strength, endurance, ROM, and flexibility.   Nustep L4x39min UE/LE LTR x 10 both ways Supine clams RTB 10x3 B  Gastroc stretch standing x 30 B THERAPEUTIC ACTIVITIES: To improve functional performance.  Sit to stand with chest opener x 10 Bridges 2x10  Supine trunk twist x 10 B Bird dog x 10- UE support Retro step 2 x 10 Gait with [facility]  06/23/23 Nustep L4x81min UE/LE LAQ 2lb 2 x 10 BLE NEUROMUSCULAR RE-EDUCATION: To improve coordination, kinesthesia, posture, and proprioception.  Standing  heel/toe raises x 15 Standing hip abduction 2x10 Standing marching 2 x 10 Standing hip extension 2x10 - cues to lift moving foot off of floor Fwd step over pool noodle x 10 B Standing bird dog x 10 B- cues for proper motion Gait Training attempted to use hiking poles but unable to coordinate movements  06/21/23 Nustep L3x73min NEUROMUSCULAR RE-EDUCATION: To improve coordination, kinesthesia, posture, and proprioception.  Standing heel/toe raises x 10 Standing hip abduction x 10 Standing marching x 10 Standing hip extension 10  Standing toe taps to yoga block x 10 B Reciprocal arm reaches to targets x 10 Airex balance narrow BOS 10 secs  Added head turns x 10 06/08/23:  Evaluation, assessment of balance, functional tests, instructed the pt in the 2 ex as listed below, with emphasis on gastroc stretch combined with the hamstring stretch. Discussed assistive device briefly and pt not ready/willing to utilize.    PATIENT EDUCATION:  Education details: HEP- see below Person educated: Patient and Spouse Education method: Explanation, Demonstration, Tactile cues, Verbal cues, and Handouts Education comprehension: verbalized understanding, returned demonstration, verbal cues required, tactile cues required, and needs further education  HOME EXERCISE PROGRAM: Access Code: [email or web address] URL: [email or web address] Date: 06/21/2023 Prepared by: Braylin Clark  Exercises - Seated Hamstring Stretch with Strap  - 2 x daily - 7 x weekly - 1 sets - 6 reps - 10sec hold - Heel Toe Raises with Counter Support  - 1 x daily - 7 x weekly - 2 sets -  10 reps - Standing Hip Extension with Counter Support  - 1 x daily - 7 x weekly - 2 sets - 10 reps - Standing Hip Abduction with Counter Support  - 1 x daily - 7 x weekly - 2 sets - 10 reps - Standing March with Counter Support  - 1 x daily - 7 x weekly - 2 sets - 10 reps  ASSESSMENT:  CLINICAL IMPRESSION: Patient returned today and we practiced  with the 2 different straps for dorsiflexion assist R ankle, better control with the velcro strap.   She still fatigues easily, but better endurance noted as well with the use of R ankle dorsiflexion assist.  It may be beneficial to request from PCP a referral to rule out underlying neurological condition as a contributor to her festinating gait.  She responded better by end of session to the various tasks, still high fall risk however, particularly with unexpected velocity changes and external distractions.    Eval: Patient is a 73 y.o. female who was evaluated today by physical therapy due to a decline in her gait and balance.  She presents as a high fall risk with her Berg score of 38/56. She demonstrates a festinating, slow gait with forward trunk flexion and weight bearing on B forefeet. She is very stiff in B ankles and demonstrates LE coordination deficits. She should benefit from skilled PT to address these deficits and hopefully improve her overall function and safety.  OBJECTIVE IMPAIRMENTS: Abnormal gait, decreased activity tolerance, decreased balance, decreased coordination, decreased endurance, difficulty walking, decreased ROM, decreased strength, impaired flexibility, postural dysfunction, and pain.   ACTIVITY LIMITATIONS: carrying, squatting, stairs, hygiene/grooming, locomotion level, and caring for others  PARTICIPATION LIMITATIONS: meal prep, cleaning, laundry, shopping, community activity, and yard work  PERSONAL FACTORS: Age, Behavior pattern, Fitness, Past/current experiences, Time since onset of injury/illness/exacerbation, and 1-2 comorbidities: h/o DM, anemia,osteopenia are also affecting patient's functional outcome.   REHAB POTENTIAL: Good  CLINICAL DECISION MAKING: Evolving/moderate complexity  EVALUATION COMPLEXITY: Moderate   GOALS: Goals reviewed with patient? Yes  SHORT TERM GOALS: Target date: 2 weeks, 06/22/23 I HEP Baseline: Goal status: 06/23/23 still need  clarification   LONG TERM GOALS: Target date: 08/31/23  30 sec sit to stand improve from 7 to 12 or greater to demonstrate improved coordination and functional strength Baseline:  Goal status: 06/29/23- 8 seconds improved  2.  Berg score improve from 38/56 to 45/56 or higher Baseline:  Goal status: INITIAL  3.  TUG score 14 or less Baseline: 23.29 Goal status: INITIAL  4.  Able to walk over 300', household distance with LAD and modified I  Baseline: needs external support, furniture, HHA Goal status: INITIAL  5.  Assess ABC balance confidence score and set goal Baseline:  Goal status: INITIAL   PLAN:  PT FREQUENCY: 2x/week  PT DURATION: 12 weeks  PLANNED INTERVENTIONS: 97110-Therapeutic exercises, 97530- Therapeutic activity, 97112- Neuromuscular re-education, 97535- Self Care, 02859- Manual therapy, 97116- Gait training, and Balance training  PLAN FOR NEXT SESSION: continue stretching/strengthening for B ankle dorsiflexion ,large amplitude movements; speed and direction changes, righting reactions, try ace wrap AFO   Marija Calamari L Kameela Leipold, PT, DPT, OCS 07/18/2023, 11:59 AM

## 2023-07-20 ENCOUNTER — Encounter: Payer: Self-pay | Admitting: Family Medicine

## 2023-07-20 DIAGNOSIS — M21371 Foot drop, right foot: Secondary | ICD-10-CM

## 2023-07-20 DIAGNOSIS — R2681 Unsteadiness on feet: Secondary | ICD-10-CM

## 2023-08-01 ENCOUNTER — Encounter: Payer: Self-pay | Admitting: Gastroenterology

## 2023-08-01 ENCOUNTER — Ambulatory Visit (AMBULATORY_SURGERY_CENTER): Admitting: *Deleted

## 2023-08-01 VITALS — Ht 61.0 in | Wt 130.0 lb

## 2023-08-01 DIAGNOSIS — Z1211 Encounter for screening for malignant neoplasm of colon: Secondary | ICD-10-CM

## 2023-08-01 MED ORDER — NA SULFATE-K SULFATE-MG SULF 17.5-3.13-1.6 GM/177ML PO SOLN
1.0000 | Freq: Once | ORAL | 0 refills | Status: AC
Start: 2023-08-01 — End: 2023-08-01

## 2023-08-01 NOTE — Progress Notes (Signed)
 Pt's name and DOB verified at the beginning of the pre-visit wit 2 identifiers  Permission given to speak with  Pt denies any difficulty with ambulating,sitting, laying down or rolling side to side  Pt has no issues moving head neck or swallowing  No egg or soy allergy known to patient   No issues known to pt with past sedation with any surgeries or procedures  Patient denies ever being intubated  No FH of Malignant Hyperthermia  Pt is not on home 02   Pt is not on blood thinners   Pt has frequent issues with constipation RN instructed pt to use Miralax per bottles instructions a week before prep days. Pt states they will  Pt is not on dialysis  Pt denise any abnormal heart rhythms   Pt denies any upcoming cardiac testing  Patient's chart reviewed by Norleen Schillings CNRA prior to pre-visit and patient appropriate for the [facility].  Pre-visit completed and red dot placed by patient's name on their procedure day (on provider's schedule).    Visit in person  Pt states weight is  IInstructions reviewed. Pt given  both [facility] main # and MD on call # prior to instructions.  Pt states understanding of instructions. Instructed pt to review instructions again prior to procedure and call main # given if has questions.. Pt states they will.   Instructed pt on where to find instructions on My Chart.

## 2023-08-17 ENCOUNTER — Ambulatory Visit (AMBULATORY_SURGERY_CENTER): Admitting: Gastroenterology

## 2023-08-17 ENCOUNTER — Encounter: Payer: Self-pay | Admitting: Gastroenterology

## 2023-08-17 VITALS — BP 113/48 | HR 78 | Temp 97.2°F | Resp 19 | Ht 61.0 in | Wt 130.0 lb

## 2023-08-17 DIAGNOSIS — Z1211 Encounter for screening for malignant neoplasm of colon: Secondary | ICD-10-CM | POA: Diagnosis not present

## 2023-08-17 DIAGNOSIS — K644 Residual hemorrhoidal skin tags: Secondary | ICD-10-CM | POA: Diagnosis not present

## 2023-08-17 DIAGNOSIS — E1165 Type 2 diabetes mellitus with hyperglycemia: Secondary | ICD-10-CM | POA: Diagnosis not present

## 2023-08-17 DIAGNOSIS — K648 Other hemorrhoids: Secondary | ICD-10-CM | POA: Diagnosis not present

## 2023-08-17 MED ORDER — SODIUM CHLORIDE 0.9 % IV SOLN: 500.0000 mL | Freq: Once | INTRAVENOUS | Status: DC

## 2023-08-17 NOTE — Op Note (Signed)
 [facility] Patient Name: Heidi [name] Procedure Date: 08/17/2023 11:16 AM MRN: [id number] Endoscopist: Gustav ALONSO Mcgee , MD, [id number] Age: 73 Referring MD:  Date of Birth: Jan 14, 1950 Gender: Female Account #: [id number] Procedure:                Colonoscopy Indications:              Screening for colorectal malignant neoplasm Medicines:                Monitored Anesthesia Care Procedure:                Pre-Anesthesia Assessment:                           - Prior to the procedure, a History and Physical                            was performed, and patient medications and                            allergies were reviewed. The patient's tolerance of                            previous anesthesia was also reviewed. The risks                            and benefits of the procedure and the sedation                            options and risks were discussed with the patient.                            All questions were answered, and informed consent                            was obtained. Prior Anticoagulants: The patient has                            taken no anticoagulant or antiplatelet agents. ASA                            Grade Assessment: II - A patient with mild systemic                            disease. After reviewing the risks and benefits,                            the patient was deemed in satisfactory condition to                            undergo the procedure.                           After obtaining informed consent, the colonoscope  was passed under direct vision. Throughout the                            procedure, the patient's blood pressure, pulse, and                            oxygen saturations were monitored continuously. The                            Olympus Scope SN: [id number] was introduced through                            the anus and advanced to the the cecum, identified                            by  appendiceal orifice and ileocecal valve. The                            colonoscopy was performed without difficulty. The                            patient tolerated the procedure well. The quality                            of the bowel preparation was good. The ileocecal                            valve, appendiceal orifice, and rectum were                            photographed. Scope In: 11:27:40 AM Scope Out: 11:37:43 AM Scope Withdrawal Time: 0 hours 8 minutes 4 seconds  Total Procedure Duration: 0 hours 10 minutes 3 seconds  Findings:                 The perianal and digital rectal examinations were                            normal.                           Non-bleeding external and internal hemorrhoids were                            found during retroflexion. The hemorrhoids were                            small.                           The exam was otherwise without abnormality. Complications:            No immediate complications. Estimated blood loss:                            None. Impression:               -  Non-bleeding external and internal hemorrhoids.                           - The examination was otherwise normal. Recommendation:           - Resume previous diet.                           - Continue present medications.                           - No repeat colonoscopy due to age. Heidi [name] V. [name], MD 08/17/2023 11:42:44 AM This report has been signed electronically.

## 2023-08-17 NOTE — Patient Instructions (Signed)
 Resume previous diet.  Continue present medications. No repeat colonoscopy due to age. Handout provided on hemorrhoids.   YOU HAD AN ENDOSCOPIC PROCEDURE TODAY AT THE [facility]:   Refer to the procedure report that was given to you for any specific questions about what was found during the examination.  If the procedure report does not answer your questions, please call your gastroenterologist to clarify.  If you requested that your care partner not be given the details of your procedure findings, then the procedure report has been included in a sealed envelope for you to review at your convenience later.  YOU SHOULD EXPECT: Some feelings of bloating in the abdomen. Passage of more gas than usual.  Walking can help get rid of the air that was put into your GI tract during the procedure and reduce the bloating. If you had a lower endoscopy (such as a colonoscopy or flexible sigmoidoscopy) you may notice spotting of blood in your stool or on the toilet paper. If you underwent a bowel prep for your procedure, you may not have a normal bowel movement for a few days.  Please Note:  You might notice some irritation and congestion in your nose or some drainage.  This is from the oxygen used during your procedure.  There is no need for concern and it should clear up in a day or so.  SYMPTOMS TO REPORT IMMEDIATELY:  Following lower endoscopy (colonoscopy or flexible sigmoidoscopy):  Excessive amounts of blood in the stool  Significant tenderness or worsening of abdominal pains  Swelling of the abdomen that is new, acute  Fever of 100F or higher  For urgent or emergent issues, a gastroenterologist can be reached at any hour by calling [phone]. Do not use MyChart messaging for urgent concerns.    DIET:  We do recommend a small meal at first, but then you may proceed to your regular diet.  Drink plenty of fluids but you should avoid alcoholic beverages for 24 hours.  ACTIVITY:   You should plan to take it easy for the rest of today and you should NOT DRIVE or use heavy machinery until tomorrow (because of the sedation medicines used during the test).    FOLLOW UP: Our staff will call the number listed on your records the next business day following your procedure.  We will call around 7:15- 8:00 am to check on you and address any questions or concerns that you may have regarding the information given to you following your procedure. If we do not reach you, we will leave a message.     If any biopsies were taken you will be contacted by phone or by letter within the next 1-3 weeks.  Please call us  at [phone] if you have not heard about the biopsies in 3 weeks.    SIGNATURES/CONFIDENTIALITY: You and/or your care partner have signed paperwork which will be entered into your electronic medical record.  These signatures attest to the fact that that the information above on your After Visit Summary has been reviewed and is understood.  Full responsibility of the confidentiality of this discharge information lies with you and/or your care-partner.

## 2023-08-17 NOTE — Progress Notes (Signed)
 Sedate, gd SR, tolerated procedure well, VSS, report to RN

## 2023-08-17 NOTE — Progress Notes (Signed)
 [facility] History and Physical   Primary Care Physician:  Copland, Harlene BROCKS, MD   Reason for Procedure:  Colorectal cancer screening  Plan:    Screening colonoscopy with possible interventions as needed     HPI: Heidi [name] is a very pleasant 73 y.o. female here for screening colonoscopy. Denies any nausea, vomiting, abdominal pain, melena or bright red blood per rectum  The risks and benefits as well as alternatives of endoscopic procedure(s) have been discussed and reviewed. All questions answered. The patient agrees to proceed.    Past Medical History:  Diagnosis Date   Allergic rhinitis due to pollen    Allergy    Anemia, unspecified    Blood transfusion without reported diagnosis    1979   Disorder of bone and cartilage, unspecified    Eosinophilia    Headache 09/24/2016   Lymphocytosis (symptomatic)    Osteoporosis    Other abnormal blood chemistry    Other and unspecified hyperlipidemia    Pain in joint, shoulder region    Reflux esophagitis    Type II or unspecified type diabetes mellitus without mention of complication, uncontrolled    Type II or unspecified type diabetes mellitus without mention of complication, uncontrolled    Umbilical hernia without mention of obstruction or gangrene    Unspecified disorder of liver     Past Surgical History:  Procedure Laterality Date   CATARACT EXTRACTION, BILATERAL     CESAREAN SECTION     COLONOSCOPY  2015   Dr. Debrah, normal exam    Prior to Admission medications   Medication Sig Start Date End Date Taking? Authorizing Provider  calcium -vitamin D  (OSCAL WITH D) 500-200 MG-UNIT per tablet Take 1 tablet by mouth daily.   Yes [provider]  CEQUA  0.09 % SOLN Apply 1 drop to eye 2 (two) times daily. 05/10/23  Yes Copland, Jessica C, MD  cetirizine (ZYRTEC) 10 MG chewable tablet Chew 10 mg by mouth daily.   Yes [provider]  docusate (COLACE) 50 MG/5ML liquid Take by mouth  daily.   Yes [provider]  Multiple Vitamin (MULTIVITAMIN) tablet Take 1 tablet by mouth daily.   Yes [provider]  simvastatin  (ZOCOR ) 20 MG tablet Take 1 tablet (20 mg total) by mouth at bedtime. 05/10/23  Yes Copland, Harlene BROCKS, MD  traZODone  (DESYREL ) 50 MG tablet TAKE 1/2 TO 1 (ONE-HALF TO ONE) TABLET BY MOUTH AT BEDTIME FOR SLEEP Patient taking differently: as needed.    [provider]    Current Outpatient Medications  Medication Sig Dispense Refill   calcium -vitamin D  (OSCAL WITH D) 500-200 MG-UNIT per tablet Take 1 tablet by mouth daily.     CEQUA  0.09 % SOLN Apply 1 drop to eye 2 (two) times daily. 180 each 3   cetirizine (ZYRTEC) 10 MG chewable tablet Chew 10 mg by mouth daily.     docusate (COLACE) 50 MG/5ML liquid Take by mouth daily.     Multiple Vitamin (MULTIVITAMIN) tablet Take 1 tablet by mouth daily.     simvastatin  (ZOCOR ) 20 MG tablet Take 1 tablet (20 mg total) by mouth at bedtime. 90 tablet 3   traZODone  (DESYREL ) 50 MG tablet TAKE 1/2 TO 1 (ONE-HALF TO ONE) TABLET BY MOUTH AT BEDTIME FOR SLEEP (Patient taking differently: as needed.)     Current Facility-Administered Medications  Medication Dose Route Frequency Provider Last Rate Last Admin   0.9 %  sodium chloride  infusion  500 mL Intravenous Once Nashika Coker,  Gustav GAILS, MD        Allergies as of 08/17/2023 - Review Complete 08/17/2023  Allergen Reaction Noted   Ciprofloxacin Other (See Comments) 01/19/2017   Alendronate sodium Other (See Comments) 01/04/2022    Family History  Problem Relation Age of Onset   Pancreatic cancer Neg Hx    Colon cancer Neg Hx    Esophageal cancer Neg Hx    Stomach cancer Neg Hx    Colon polyps Neg Hx    Rectal cancer Neg Hx     Social History   Socioeconomic History   Marital status: Married    Spouse name: Armed forces training and education officer   Number of children: 4   Years of education: BSN   Highest education level: Not on file  Occupational History   Occupation:  Teacher, adult education: [employer]  Tobacco Use   Smoking status: Never   Smokeless tobacco: Never  Vaping Use   Vaping status: Never Used  Substance and Sexual Activity   Alcohol use: No   Drug use: No   Sexual activity: Yes    Partners: Male    Birth control/protection: Post-menopausal  Other Topics Concern   Not on file  Social History Narrative   Married   Never smoked   Alcohol none   Exercise walk 2-3 times week   Children 4   BS Nursing , retired         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 [employer] Strain: Low Risk  (02/21/2023)   Overall Financial Resource Strain (CARDIA)    Difficulty of Paying Living Expenses: Not hard at all  Food Insecurity: No Food Insecurity (02/21/2023)   Hunger Vital Sign    Worried About Running Out of Food in the Last Year: Never true    Ran Out of Food in the Last Year: Never true  Transportation Needs: No Transportation Needs (02/21/2023)   PRAPARE - Administrator, Civil Service (Medical): No    Lack of Transportation (Non-Medical): No  Physical Activity: Insufficiently Active (02/21/2023)   Exercise Vital Sign    Days of Exercise per Week: 3 days    Minutes of Exercise per Session: 30 min  Stress: No Stress Concern Present (02/21/2023)   [employer] of Occupational Health - Occupational Stress Questionnaire    Feeling of Stress : Not at all  Social Connections: Socially Integrated (02/21/2023)   Social Connection and Isolation Panel    Frequency of Communication with Friends and Family: More than three times a week    Frequency of Social Gatherings with Friends and Family: More than three times a week    Attends Religious Services: More than 4 times per year    Active Member of [employer] or Organizations: Yes    Attends Engineer, structural: More than 4 times per year    Marital Status: Married  Catering manager Violence: Not At Risk (02/21/2023)   Humiliation, Afraid, Rape, and Kick questionnaire     Fear of Current or Ex-Partner: No    Emotionally Abused: No    Physically Abused: No    Sexually Abused: No    Review of Systems:  All other review of systems negative except as mentioned in the HPI.  Physical Exam: Vital signs in last 24 hours: BP (!) 148/58   Pulse 70   Temp (!) 97.2 F (36.2 C) (Temporal)   Ht 5' 1 (1.549 m)   Wt 130 lb (59 kg)   SpO2  96%   BMI 24.56 kg/m  General:   Alert, NAD Lungs:  Clear .   Heart:  Regular rate and rhythm Abdomen:  Soft, nontender and nondistended. Neuro/Psych:  Alert and cooperative. Normal mood and affect. A and O x 3  Reviewed labs, radiology imaging, old records and pertinent past GI work up  Patient is appropriate for planned procedure(s) and anesthesia in an ambulatory setting   K. Veena Ariday Brinker , MD [phone]

## 2023-08-17 NOTE — Progress Notes (Signed)
 Pt's states no medical or surgical changes since previsit or office visit.

## 2023-08-18 ENCOUNTER — Telehealth: Payer: Self-pay

## 2023-08-18 NOTE — Telephone Encounter (Signed)
 Follow up call to pt, no answer.

## 2023-08-21 DIAGNOSIS — Z1231 Encounter for screening mammogram for malignant neoplasm of breast: Secondary | ICD-10-CM | POA: Diagnosis not present

## 2023-08-21 LAB — HM MAMMOGRAPHY

## 2023-08-28 ENCOUNTER — Encounter (HOSPITAL_BASED_OUTPATIENT_CLINIC_OR_DEPARTMENT_OTHER): Payer: Self-pay | Admitting: Emergency Medicine

## 2023-08-28 ENCOUNTER — Emergency Department (HOSPITAL_BASED_OUTPATIENT_CLINIC_OR_DEPARTMENT_OTHER): Admitting: Radiology

## 2023-08-28 ENCOUNTER — Other Ambulatory Visit: Payer: Self-pay

## 2023-08-28 ENCOUNTER — Emergency Department (HOSPITAL_BASED_OUTPATIENT_CLINIC_OR_DEPARTMENT_OTHER): Admission: EM | Admit: 2023-08-28 | Discharge: 2023-08-28 | Disposition: A | Discharge status: Home / Self Care

## 2023-08-28 DIAGNOSIS — R42 Dizziness and giddiness: Secondary | ICD-10-CM | POA: Insufficient documentation

## 2023-08-28 DIAGNOSIS — R0781 Pleurodynia: Secondary | ICD-10-CM | POA: Diagnosis not present

## 2023-08-28 DIAGNOSIS — E119 Type 2 diabetes mellitus without complications: Secondary | ICD-10-CM | POA: Diagnosis not present

## 2023-08-28 DIAGNOSIS — W133XXA Fall through floor, initial encounter: Secondary | ICD-10-CM | POA: Diagnosis not present

## 2023-08-28 DIAGNOSIS — W19XXXA Unspecified fall, initial encounter: Secondary | ICD-10-CM

## 2023-08-28 LAB — CBC
HCT: 38.8 % (ref 36.0–46.0)
Hemoglobin: 12.8 g/dL (ref 12.0–15.0)
MCH: 27.8 pg (ref 26.0–34.0)
MCHC: 33 g/dL (ref 30.0–36.0)
MCV: 84.3 fL (ref 80.0–100.0)
Platelets: 277 K/uL (ref 150–400)
RBC: 4.6 MIL/uL (ref 3.87–5.11)
RDW: 13.2 % (ref 11.5–15.5)
WBC: 7.4 K/uL (ref 4.0–10.5)
nRBC: 0 % (ref 0.0–0.2)

## 2023-08-28 LAB — BASIC METABOLIC PANEL WITH GFR
Anion gap: 11 (ref 5–15)
BUN: 13 mg/dL (ref 8–23)
CO2: 23 mmol/L (ref 22–32)
Calcium: 9.6 mg/dL (ref 8.9–10.3)
Chloride: 105 mmol/L (ref 98–111)
Creatinine, Ser: 0.95 mg/dL (ref 0.44–1.00)
GFR, Estimated: >60 mL/min (ref >60)
Glucose, Bld: 90 mg/dL (ref 70–99)
Potassium: 3.8 mmol/L (ref 3.5–5.1)
Sodium: 139 mmol/L (ref 135–145)

## 2023-08-28 MED ORDER — OXYCODONE HCL 5 MG PO TABS: 2.5000 mg | ORAL_TABLET | Freq: Four times a day (QID) | ORAL | 0 refills | Status: DC | PRN

## 2023-08-28 MED ORDER — ACETAMINOPHEN 500 MG PO TABS
1000.0000 mg | ORAL_TABLET | Freq: Once | ORAL | Status: AC
  Administered 2023-08-28: 1000 mg via ORAL
  Filled 2023-08-28: qty 2

## 2023-08-28 NOTE — ED Notes (Signed)

## 2023-08-28 NOTE — ED Provider Notes (Signed)
 [facility] [facility] AT [facility] Provider Note   CSN: [id number] Arrival date & time: 08/28/23  1018     Patient presents with: Fall   Heidi [name] is a 73 y.o. female.    Fall   73 year old female presents emergency department with complaints of fall.  Fall occurred last night.  Patient was in the restroom, stood up to change her close felt lightheaded/dizzy and subsequently fell to the floor.  States that she hit her left ribs on the wall and lowered herself to her hands and knees.  Denies trauma to head, blood thinner use, LOC.  Denies any pain/injury elsewhere.  States that this has happened before in the past typically related to getting up from a seated/laying down position.  Past medical history significant for anemia, eosinophilia, diabetes mellitus type 2, reflux esophagitis,  Prior to Admission medications   Medication Sig Start Date End Date Taking? Authorizing Provider  calcium -vitamin D  (OSCAL WITH D) 500-200 MG-UNIT per tablet Take 1 tablet by mouth daily.    [provider]  CEQUA  0.09 % SOLN Apply 1 drop to eye 2 (two) times daily. 05/10/23   Copland, Jessica C, MD  cetirizine (ZYRTEC) 10 MG chewable tablet Chew 10 mg by mouth daily.    [provider]  docusate (COLACE) 50 MG/5ML liquid Take by mouth daily.    [provider]  Multiple Vitamin (MULTIVITAMIN) tablet Take 1 tablet by mouth daily.    [provider]  simvastatin  (ZOCOR ) 20 MG tablet Take 1 tablet (20 mg total) by mouth at bedtime. 05/10/23   Copland, Harlene BROCKS, MD  traZODone  (DESYREL ) 50 MG tablet TAKE 1/2 TO 1 (ONE-HALF TO ONE) TABLET BY MOUTH AT BEDTIME FOR SLEEP Patient taking differently: as needed.    [provider]    Allergies: Ciprofloxacin and Alendronate sodium    Review of Systems  All other systems reviewed and are negative.   Updated Vital Signs BP 130/73 (BP Location: Left Arm)   Pulse 70   Temp 98.2 F (36.8 C)  (Oral)   Resp 17   SpO2 97%   Physical Exam Vitals and nursing note reviewed.  Constitutional:      General: She is not in acute distress.    Appearance: She is well-developed.  HENT:     Head: Normocephalic and atraumatic.  Eyes:     Conjunctiva/sclera: Conjunctivae normal.  Cardiovascular:     Rate and Rhythm: Normal rate and regular rhythm.     Heart sounds: No murmur heard. Pulmonary:     Effort: Pulmonary effort is normal. No respiratory distress.     Breath sounds: Normal breath sounds.  Abdominal:     Palpations: Abdomen is soft.     Tenderness: There is no abdominal tenderness.  Musculoskeletal:        General: No swelling.     Cervical back: Neck supple.     Comments: No midline tenderness cervical, thoracic, lumbar spine without step-off or deformity.  Left lateral chest wall tenderness.  Full range of motion of bilateral upper lower extremities without overlying tenderness.  Skin:    General: Skin is warm and dry.     Capillary Refill: Capillary refill takes less than 2 seconds.  Neurological:     Mental Status: She is alert.  Psychiatric:        Mood and Affect: Mood normal.     (all labs ordered are listed, but only abnormal results are displayed) Labs Reviewed - No  data to display  EKG: None  Radiology: DG Ribs Unilateral W/Chest Left Result Date: 08/28/2023 CLINICAL DATA:  Fall RIB pain. EXAM: LEFT RIBS AND CHEST - 3+ VIEW COMPARISON:  06/23/2017. FINDINGS: Bilateral lung fields are clear. Bilateral costophrenic angles are clear. Normal cardio-mediastinal silhouette. No acute osseous abnormalities. The soft tissues are within normal limits. IMPRESSION: Negative. Electronically Signed   By: Ree Molt M.D.   On: 08/28/2023 10:53     Procedures   Medications Ordered in the ED - No data to display                                  Medical Decision Making Amount and/or Complexity of Data Reviewed Labs: ordered. Radiology: ordered.  Risk OTC  drugs. Prescription drug management.   This patient presents to the ED for concern of fall, this involves an extensive number of treatment options, and is a complaint that carries with it a high risk of complications and morbidity.  The differential diagnosis includes fracture, strain/sprain, dislocation, ligamentous/tendinous injury, neurovascular compromise, intra-abdominal organ injury, pneumothorax, hemothorax, other   Co morbidities that complicate the patient evaluation  See HPI   Additional history obtained:  Additional history obtained from EMR External records from outside source obtained and reviewed including hospital records   Lab Tests:  No leukocytosis.  No evidence of anemia.  Platelets within range.  No Electra abnormalities.  No renal dysfunction.   Imaging Studies ordered:  I ordered imaging studies including chest x-ray with left ribs I independently visualized and interpreted imaging which showed no acute fracture or dislocation I agree with the radiologist interpretation   Cardiac Monitoring: / EKG:  The patient was maintained on a cardiac monitor.  I personally viewed and interpreted the cardiac monitored which showed an underlying rhythm of: sinus rhythm   Consultations Obtained:  N/a   Problem List / ED Course / Critical interventions / Medication management  Fall I ordered medication including tylenol    Reevaluation of the patient after these medicines showed that the patient improved I have reviewed the patients home medicines and have made adjustments as needed   Social Determinants of Health:  Denies tobacco, licit drug use.   Test / Admission - Considered:  Fall, left rib pain Vitals signs within normal range and stable throughout visit. Imaging/laboratory studies significant for: See above 73 year old female presents emergency department with complaints of fall.  Fall occurred last night.  Patient was in the restroom, stood up to  change her close felt lightheaded/dizzy and subsequently fell to the floor.  States that she hit her left ribs on the wall and lowered herself to her hands and knees.  Denies trauma to head, blood thinner use, LOC.  Denies any pain/injury elsewhere.  States that this has happened before in the past typically related to getting up from a seated/laying down position. On exam, left lateral rib tenderness otherwise, no reproducible tenderness or appreciable traumatic injury.  Patient's episode of lightheadedness occurred when standing up from a seated position without true syncope occurring.  Symptoms did not seem seizure-like in nature.  No exertional worsening symptoms; low suspicion for cardiac/pulmonary etiology.  Labs and EKG unremarkable for any acute process.  Suspect orthostatic hypotension as cause of near syncopal episode.  Recommend adequate oral hydration at home.  Up slowly from a seated/laying down position.  From a trauma perspective, x-ray reassuring.  Patient treated with medication for pain  with improvement of symptoms in the ED.  Will send home with incentive spirometer as well as recommend follow-up with primary care for reassessment.  Treatment plan discussed with patient and she is understanding agreeable.  Patient well-appearing, afebrile in no acute distress upon discharge. Worrisome signs and symptoms were discussed with the patient, and the patient acknowledged understanding to return to the ED if noticed. Patient was stable upon discharge.       Final diagnoses:  None    ED Discharge Orders     None          Silver Wonda LABOR, [place] 08/28/23 1244    Simon Lavonia SAILOR, MD 08/29/23 1024

## 2023-08-28 NOTE — Discharge Instructions (Addendum)
 Your workup today was reassuring.  Your labs appeared normal.  X-ray of your chest showed no obvious collapsed lung or obvious rib fracture.  We do miss small rib fractures on x-ray sometimes but we treat this the same with pain control and continue to use the incentive spirometer at the respiratory therapist should he had to use any emergency department.  Recommend Tylenol  for baseline pain with pain medication for breakthrough pain.  Recommend follow-up with your primary care for reassessment.  Recommend oral hydration at home.  Recommend follow-up with your primary care.

## 2023-08-28 NOTE — ED Triage Notes (Signed)
 Mechanical fall last night. States pain in left ribs. Worse when taking deep breath. Denies thinners or LOC.

## 2023-09-06 ENCOUNTER — Ambulatory Visit: Admitting: Diagnostic Neuroimaging

## 2023-09-06 ENCOUNTER — Encounter: Payer: Self-pay | Admitting: Diagnostic Neuroimaging

## 2023-09-06 VITALS — BP 110/76 | HR 63 | Ht 62.0 in | Wt 171.0 lb

## 2023-09-06 DIAGNOSIS — R413 Other amnesia: Secondary | ICD-10-CM | POA: Diagnosis not present

## 2023-09-06 DIAGNOSIS — M48061 Spinal stenosis, lumbar region without neurogenic claudication: Secondary | ICD-10-CM

## 2023-09-06 NOTE — Progress Notes (Signed)
 [facility]  PATIENT: Heidi [name] DOB: Mar 21, 1950  REFERRING CLINICIAN: Copland, Harlene BROCKS, MD  HISTORY FROM: patient and husband (daughter via phone who is an ob/gyn) REASON FOR VISIT: follow up   HISTORICAL  CHIEF COMPLAINT:  Chief Complaint  Patient presents with   Neurologic Problem    Pt with husband. Rm 7, She has been having difficulty with her walking. Unsteady gait. this has been going on for a while but was more evident when working with PT and therapist noted and brought to PCP attention. She denies pain, numbness or tingling. No imaging has been completed. She has short steps and shuffling There has also been changes in short term memory.     HISTORY OF PRESENT ILLNESS:   UPDATE (09/06/23, VRP): Since last visit, doing memory loss continues, and some changes in ADLs (needs help and reminder for meds, meals). Gait issues continue. Had some worsening issues in May 2025, went to PCP and then PT, and was diagnosed with right foot drop. Since then, right foot drop has improved. Still some unsteady gait and fear of falling. Family shares a video with short, narrow unsteady steps in a parking lot (astasia-abasia appearance).   UPDATE (11/21/22, VRP): Since last visit, doing better with memory. Continues with stooped posture and gait issues. No falls. Has been through PT. No back pain. Some pain in right calf with walking.   UPDATE (12/13/17, VRP): Since last visit, doing well. Symptoms are improved. Memory is better. Dizziness is better. No alleviating or aggravating factors. Had second opinion with [facility] clinic, who felt patient likely had MCI.   UPDATE (06/13/17, VRP): 73 year old female with progressive short term memory loss and gait diff x 1-2 years. Also retired ~ 2 yrs ago, due to age. Had MRI brain and this shows atrophy and ventriculomegaly. No alleviating or aggravating factors. No clear loss of ADLs and ability to be independent outside of home,  but patient no longer driving, so not known how she would do independently. Some slowness of walking and physical movements.   UPDATE 12/02/13: Since last visit, continues to do well. No more dizziness. Only some intermittent ringing in ears, mild. Overall, feels well.  PRIOR HPI (10/18/13): 73 year old female with diabetes and hypercholesterolemia, here for evaluation of dizziness and vertigo. 10/15/13 in the evening patient had sudden onset of room spinning sensation with nausea, sweating, vomiting. No headache, vision changes, pain. No numbness in the hands or feet. No weakness. No recent infections, trauma, change in medication. Patient went to urgent care on 10/16/13, had blood work and CT scan ordered, which were unremarkable. Patient symptoms improved yesterday and today are significantly better. Has been taking Zofran  as needed for nausea.   REVIEW OF SYSTEMS: Full 14 system review of systems performed and negative except: as per HPI.   ALLERGIES: Allergies  Allergen Reactions   Ciprofloxacin Other (See Comments)    Patient states she gets CDIFF Family states this is not correct and this is not an allergy   Alendronate Sodium Other (See Comments)    Other Reaction(s): Unknown fosamax- pt not sure of reaction    HOME MEDICATIONS: Outpatient Medications Prior to Visit  Medication Sig Dispense Refill   calcium -vitamin D  (OSCAL WITH D) 500-200 MG-UNIT per tablet Take 1 tablet by mouth daily.     CEQUA  0.09 % SOLN Apply 1 drop to eye 2 (two) times daily. 180 each 3   cetirizine (ZYRTEC) 10 MG chewable tablet Chew 10 mg  by mouth daily.     docusate (COLACE) 50 MG/5ML liquid Take by mouth daily.     Multiple Vitamin (MULTIVITAMIN) tablet Take 1 tablet by mouth daily.     simvastatin  (ZOCOR ) 20 MG tablet Take 1 tablet (20 mg total) by mouth at bedtime. 90 tablet 3   traZODone  (DESYREL ) 50 MG tablet TAKE 1/2 TO 1 (ONE-HALF TO ONE) TABLET BY MOUTH AT BEDTIME FOR SLEEP (Patient taking  differently: as needed.)     oxyCODONE  (ROXICODONE ) 5 MG immediate release tablet Take 0.5 tablets (2.5 mg total) by mouth every 6 (six) hours as needed for breakthrough pain. 6 tablet 0   No facility-administered medications prior to visit.    PAST MEDICAL HISTORY: Past Medical History:  Diagnosis Date   Allergic rhinitis due to pollen    Allergy    Anemia, unspecified    Blood transfusion without reported diagnosis    1979   Disorder of bone and cartilage, unspecified    Eosinophilia    Headache 09/24/2016   Lymphocytosis (symptomatic)    Osteoporosis    Other abnormal blood chemistry    Other and unspecified hyperlipidemia    Pain in joint, shoulder region    Reflux esophagitis    Type II or unspecified type diabetes mellitus without mention of complication, uncontrolled    Type II or unspecified type diabetes mellitus without mention of complication, uncontrolled    Umbilical hernia without mention of obstruction or gangrene    Unspecified disorder of liver     PAST SURGICAL HISTORY: Past Surgical History:  Procedure Laterality Date   CATARACT EXTRACTION, BILATERAL     CESAREAN SECTION     COLONOSCOPY  2015   Dr. Debrah, normal exam    FAMILY HISTORY: Family History  Problem Relation Age of Onset   Pancreatic cancer Neg Hx    Colon cancer Neg Hx    Esophageal cancer Neg Hx    Stomach cancer Neg Hx    Colon polyps Neg Hx    Rectal cancer Neg Hx     SOCIAL HISTORY:  Social History   Socioeconomic History   Marital status: Married    Spouse name: Armed forces training and education officer   Number of children: 4   Years of education: BSN   Highest education level: Not on file  Occupational History   Occupation: Teacher, adult education: [employer]  Tobacco Use   Smoking status: Never   Smokeless tobacco: Never  Vaping Use   Vaping status: Never Used  Substance and Sexual Activity   Alcohol use: No   Drug use: No   Sexual activity: Yes    Partners: Male    Birth  control/protection: Post-menopausal  Other Topics Concern   Not on file  Social History Narrative   Married   Never smoked   Alcohol none   Exercise walk 2-3 times week   Children 4   BS Nursing , retired         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 Corporate investment banker Strain: Low Risk  (02/21/2023)   Overall Financial Resource Strain (CARDIA)    Difficulty of Paying Living Expenses: Not hard at all  Food Insecurity: No Food Insecurity (02/21/2023)   Hunger Vital Sign    Worried About Running Out of Food in the Last Year: Never true    Ran Out of Food in the Last Year: Never true  Transportation Needs: No Transportation Needs (02/21/2023)   PRAPARE - Transportation    Lack  of Transportation (Medical): No    Lack of Transportation (Non-Medical): No  Physical Activity: Insufficiently Active (02/21/2023)   Exercise Vital Sign    Days of Exercise per Week: 3 days    Minutes of Exercise per Session: 30 min  Stress: No Stress Concern Present (02/21/2023)   [employer] of Occupational Health - Occupational Stress Questionnaire    Feeling of Stress : Not at all  Social Connections: Socially Integrated (02/21/2023)   Social Connection and Isolation Panel    Frequency of Communication with Friends and Family: More than three times a week    Frequency of Social Gatherings with Friends and Family: More than three times a week    Attends Religious Services: More than 4 times per year    Active Member of [employer] or Organizations: Yes    Attends Engineer, structural: More than 4 times per year    Marital Status: Married  Catering manager Violence: Not At Risk (02/21/2023)   Humiliation, Afraid, Rape, and Kick questionnaire    Fear of Current or Ex-Partner: No    Emotionally Abused: No    Physically Abused: No    Sexually Abused: No     PHYSICAL EXAM  GENERAL EXAM/CONSTITUTIONAL: Vitals:  Vitals:   09/06/23 1034  BP: 110/76  Pulse: 63  Weight: 171 lb (77.6 kg)  Height: 5' 2  (1.575 m)   Patient is in no distress; well developed, nourished and groomed; neck is supple  CARDIOVASCULAR: Examination of carotid arteries is normal; no carotid bruits Regular rate and rhythm, no murmurs Examination of peripheral vascular system by observation and palpation is normal  EYES: Ophthalmoscopic exam of optic discs and posterior segments is normal; no papilledema or hemorrhages  MUSCULOSKELETAL: Gait, strength, tone, movements noted in Neurologic exam below  NEUROLOGIC: MENTAL STATUS:     09/06/2023   10:38 AM 11/21/2022    8:57 AM 12/13/2017    2:08 PM  MMSE - Mini Mental State Exam  Orientation to time 3 4 4   Orientation to Place 5 5 4   Registration 3 3 3   Attention/ Calculation 4 5 3   Recall 0 2 2  Language- name 2 objects 2 2 2   Language- repeat 1 1 0  Language- follow 3 step command 3 3 3   Language- read & follow direction 1 1 1   Write a sentence 1 1 1   Copy design 0 0 0  Total score 23 27 23    awake, alert, oriented to person, place and time recent and remote memory intact normal attention and concentration language fluent, comprehension intact, naming intact,  fund of knowledge appropriate  CRANIAL NERVE:  2nd - no papilledema on fundoscopic exam 2nd, 3rd, 4th, 6th - pupils equal and reactive to light, visual fields full to confrontation, extraocular muscles intact, no nystagmus 5th - facial sensation symmetric 7th - facial strength symmetric 8th - hearing intact 9th - palate elevates symmetrically, uvula midline 11th - shoulder shrug symmetric 12th - tongue protrusion midline MASKED FACIES  MOTOR:  normal bulk and tone, full strength in the BUE, BLE  SENSORY:  normal and symmetric to light touch, temperature, vibration  COORDINATION:  finger-nose-finger, fine finger movements SLOW  REFLEXES:  deep tendon reflexes TRACE and symmetric  GAIT/STATION:  narrow based gait; STOOPED POSTURE; SLIGHTLY CAUTIOUS; good arm swing; ABLE TO WALK  ON TOES AND HEELS     DIAGNOSTIC DATA (LABS, IMAGING, TESTING) - I reviewed patient records, labs, notes, testing and imaging myself where available.  Lab Results  Component Value Date   WBC 7.4 08/28/2023   HGB 12.8 08/28/2023   HCT 38.8 08/28/2023   MCV 84.3 08/28/2023   PLT 277 08/28/2023      Component Value Date/Time   NA 139 08/28/2023 1212   NA 140 06/25/2013 0846   NA 138 11/19/2012 1251   K 3.8 08/28/2023 1212   K 3.8 11/19/2012 1251   CL 105 08/28/2023 1212   CL 108 (H) 04/23/2012 1302   CO2 23 08/28/2023 1212   CO2 27 11/19/2012 1251   GLUCOSE 90 08/28/2023 1212   GLUCOSE 80 11/19/2012 1251   GLUCOSE 88 04/23/2012 1302   BUN 13 08/28/2023 1212   BUN 14 06/25/2013 0846   BUN 13.7 11/19/2012 1251   CREATININE 0.95 08/28/2023 1212   CREATININE 1.10 (H) 09/25/2019 1057   CREATININE 0.9 11/19/2012 1251   CALCIUM  9.6 08/28/2023 1212   CALCIUM  9.2 11/19/2012 1251   PROT 8.1 05/10/2023 1512   PROT 7.4 06/25/2013 0846   PROT 7.7 11/19/2012 1251   ALBUMIN 4.2 05/10/2023 1512   ALBUMIN 4.3 06/25/2013 0846   ALBUMIN 3.8 11/19/2012 1251   AST 21 05/10/2023 1512   AST 23 11/19/2012 1251   ALT 29 05/10/2023 1512   ALT 17 11/19/2012 1251   ALKPHOS 66 05/10/2023 1512   ALKPHOS 73 11/19/2012 1251   BILITOT 0.4 05/10/2023 1512   BILITOT 0.33 11/19/2012 1251   GFRNONAA >60 08/28/2023 1212   GFRNONAA 82 03/02/2015 1145   GFRAA >60 06/25/2017 0605   GFRAA >89 03/02/2015 1145   Lab Results  Component Value Date   CHOL 181 05/10/2023   HDL 49.50 05/10/2023   LDLCALC 117 (H) 05/10/2023   TRIG 72.0 05/10/2023   CHOLHDL 4 05/10/2023   Lab Results  Component Value Date   HGBA1C 6.3 05/10/2023   Lab Results  Component Value Date   VITAMINB12 3,853 (H) 06/24/2017   Lab Results  Component Value Date   TSH 1.18 05/10/2023    10/17/13 CT HEAD [I reviewed images myself and agree with interpretation. -VRP]  1. Ventricular dilatation is again noted, slightly  disproportionate to the degree of diffuse cortical atrophy. Is there any clinical suspicion of normal pressure hydrocephalus?  2. Atrophy and moderate small vessel ischemic change.  05/03/17 MRI brain [report only] 1.  Moderate ventricular dilatation out of proportion to the degree of cerebral atrophy. This has been described on previous CTs of the head. This likely represents central atrophy but normal pressure hydrocephalus is not excluded. Please correlate for signs and symptoms of normal pressure hydrocephalus. 2.   Chronic microvascular ischemic changes in the periventricular white matter.  3.  No acute intracranial abnormality.    ASSESSMENT AND PLAN  73 y.o. year old female here with sudden positional vertigo on 10/15/13, then improved. Memory loss, gait diff and confusion since 2017.   Dx:  1. Memory loss   2. Spinal stenosis of lumbar region without neurogenic claudication        PLAN:  MEMORY LOSS (mild changes in ADLs (medications, meals), MMSE 23/30; possible MCI vs mild dementia) - check FDG PET and ATN panel - try to stay active physically and get some exercise (at least 15-30 minutes per day) - eat a nutritious diet with lean protein, plants / vegetables, whole grains; avoid ultra-processed foods - increase social activities, brain stimulation, games, puzzles, hobbies, crafts, arts, music; try new activities; keep it fun! - aim for at least 7-8 hours sleep  per night (or more) - avoid smoking and alcohol - caution with medications, finances, driving  GAIT DIFFICULTY (stooped posture) - check MRI lumbar spine (prior foot drop; rule out spinal stenosis or lumbar radiculopathy) - continue PT exercises; follow up exercise program at [facility] - fall precautions reviewed; consider cane / walker  Orders Placed This Encounter  Procedures   NM PET Metabolic Brain   MR LUMBAR SPINE WO CONTRAST   ATN PROFILE   Return in about 6 months (around 03/05/2024) for MyChart visit (15  min).  I spent 40 minutes of face-to-face and non-face-to-face time with patient.  This included previsit chart review, lab review, study review, order entry, electronic health record documentation, patient education.    EDUARD FABIENE HANLON, MD 09/06/2023, 11:44 AM Certified in Neurology, Neurophysiology and Neuroimaging  [facility] [address], [address] [place], [place] [address] [phone]

## 2023-09-06 NOTE — Patient Instructions (Signed)
 MEMORY LOSS (mild changes in ADLs (medications, meals), MMSE 23/30; possible MCI vs mild dementia) - check FDG PET and ATN panel - try to stay active physically and get some exercise (at least 15-30 minutes per day) - eat a nutritious diet with lean protein, plants / vegetables, whole grains; avoid ultra-processed foods - increase social activities, brain stimulation, games, puzzles, hobbies, crafts, arts, music; try new activities; keep it fun! - aim for at least 7-8 hours sleep per night (or more) - avoid smoking and alcohol - caution with medications, finances, driving  GAIT DIFFICULTY (stooped posture) - check MRI lumbar spine (prior foot drop; rule out spinal stenosis or lumbar radiculopathy) - continue PT exercises; follow up exercise program at [facility] - fall precautions reviewed; consider cane / walker

## 2023-09-08 ENCOUNTER — Institutional Professional Consult (permissible substitution): Admitting: Diagnostic Neuroimaging

## 2023-09-09 ENCOUNTER — Ambulatory Visit: Payer: Self-pay | Admitting: Diagnostic Neuroimaging

## 2023-09-09 DIAGNOSIS — M48061 Spinal stenosis, lumbar region without neurogenic claudication: Secondary | ICD-10-CM

## 2023-09-09 LAB — ATN PROFILE
A -- Beta-amyloid 42/40 Ratio: 0.142 (ref >0.102)
Beta-amyloid 40: 145.89 pg/mL
Beta-amyloid 42: 20.67 pg/mL
N -- NfL, Plasma: 2.06 pg/mL (ref 0.00–6.04)
T -- p-tau181: 0.6 pg/mL (ref 0.00–0.97)

## 2023-09-11 ENCOUNTER — Telehealth: Payer: Self-pay | Admitting: Diagnostic Neuroimaging

## 2023-09-11 NOTE — Telephone Encounter (Signed)
 Her insurance is requiring a peer to peer for the pet scan. I can schedule it online if you give me a date and time that works and a phone number for them to call. It needs to be done by 9/14.

## 2023-09-14 NOTE — Telephone Encounter (Signed)
Did you talk to them?

## 2023-09-20 ENCOUNTER — Encounter: Payer: Self-pay | Admitting: Diagnostic Neuroimaging

## 2023-09-21 ENCOUNTER — Ambulatory Visit
Admission: RE | Admit: 2023-09-21 | Discharge: 2023-09-21 | Disposition: A | Discharge status: Home / Self Care | Source: Ambulatory Visit | Attending: Diagnostic Neuroimaging | Admitting: Diagnostic Neuroimaging

## 2023-09-21 DIAGNOSIS — M48061 Spinal stenosis, lumbar region without neurogenic claudication: Secondary | ICD-10-CM

## 2023-09-28 ENCOUNTER — Other Ambulatory Visit: Payer: Self-pay

## 2023-09-28 ENCOUNTER — Emergency Department (HOSPITAL_BASED_OUTPATIENT_CLINIC_OR_DEPARTMENT_OTHER)

## 2023-09-28 ENCOUNTER — Emergency Department (HOSPITAL_BASED_OUTPATIENT_CLINIC_OR_DEPARTMENT_OTHER)
Admission: EM | Admit: 2023-09-28 | Discharge: 2023-09-28 | Disposition: A | Discharge status: Home / Self Care | Attending: Emergency Medicine | Admitting: Emergency Medicine

## 2023-09-28 DIAGNOSIS — W1839XA Other fall on same level, initial encounter: Secondary | ICD-10-CM | POA: Diagnosis not present

## 2023-09-28 DIAGNOSIS — E119 Type 2 diabetes mellitus without complications: Secondary | ICD-10-CM | POA: Insufficient documentation

## 2023-09-28 DIAGNOSIS — S7002XA Contusion of left hip, initial encounter: Secondary | ICD-10-CM | POA: Diagnosis not present

## 2023-09-28 DIAGNOSIS — W19XXXA Unspecified fall, initial encounter: Secondary | ICD-10-CM

## 2023-09-28 DIAGNOSIS — M25552 Pain in left hip: Secondary | ICD-10-CM | POA: Diagnosis not present

## 2023-09-28 DIAGNOSIS — S79912A Unspecified injury of left hip, initial encounter: Secondary | ICD-10-CM | POA: Diagnosis not present

## 2023-09-28 LAB — CBG MONITORING, ED: Glucose-Capillary: 88 mg/dL (ref 70–99)

## 2023-09-28 MED ORDER — ACETAMINOPHEN 500 MG PO TABS
1000.0000 mg | ORAL_TABLET | Freq: Once | ORAL | Status: AC
Start: 2023-09-28 — End: 2023-09-28
  Administered 2023-09-28: 1000 mg via ORAL
  Filled 2023-09-28: qty 2

## 2023-09-28 MED ORDER — IBUPROFEN 400 MG PO TABS
400.0000 mg | ORAL_TABLET | Freq: Once | ORAL | Status: DC
  Filled 2023-09-28: qty 1

## 2023-09-28 NOTE — Discharge Instructions (Signed)
 Take extra strength tylenol  (2 tablets) every 6 hours as needed for pain.  Also you can try an ice pack.  You will be very sore over the next few days.

## 2023-09-28 NOTE — ED Provider Notes (Signed)
 [facility] AT [facility] Provider Note   CSN: [id number] Arrival date & time: 09/28/23  9240     Patient presents with: [place]   Heidi [name] is a 74 y.o. female.   Patient is a 73 year old female with a history of diabetes, reflux esophagitis, gait instability, developing memory issues who is presenting today with a fall this morning.  Patient and her family report that she was getting up to go to the bathroom when she reports feeling a little dizzy and falling backwards and landing on her left hip.  She did not hit her head or lose consciousness.  She reports after the fall when she developed the pain she had some nausea that she thinks is related to the pain but that is subsided.  She denies any headache, chest pain, palpitations or shortness of breath.  She has no abdominal pain.  She has no pain in the lower part of her legs.  She takes no anticoagulation has had no recent medication changes.  She is following with physical therapy and her neurologist for her gait problems.  The history is provided by the patient, a relative, the spouse and medical records.  Fall       Prior to Admission medications   Medication Sig Start Date End Date Taking? Authorizing Provider  calcium -vitamin D  (OSCAL WITH D) 500-200 MG-UNIT per tablet Take 1 tablet by mouth daily.    [provider]  CEQUA  0.09 % SOLN Apply 1 drop to eye 2 (two) times daily. 05/10/23   Copland, Jessica C, MD  cetirizine (ZYRTEC) 10 MG chewable tablet Chew 10 mg by mouth daily.    [provider]  docusate (COLACE) 50 MG/5ML liquid Take by mouth daily.    [provider]  Multiple Vitamin (MULTIVITAMIN) tablet Take 1 tablet by mouth daily.    [provider]  simvastatin  (ZOCOR ) 20 MG tablet Take 1 tablet (20 mg total) by mouth at bedtime. 05/10/23   Copland, Harlene BROCKS, MD  traZODone  (DESYREL ) 50 MG tablet TAKE 1/2 TO 1 (ONE-HALF TO ONE) TABLET BY MOUTH AT  BEDTIME FOR SLEEP Patient taking differently: as needed.    [provider]    Allergies: Ciprofloxacin and Alendronate sodium    Review of Systems  Updated Vital Signs BP 131/76   Pulse (!) 56   Temp 98.1 F (36.7 C) (Oral)   Resp 16   SpO2 95%   Physical Exam Vitals and nursing note reviewed.  Constitutional:      General: She is not in acute distress.    Appearance: She is well-developed.  HENT:     Head: Normocephalic and atraumatic.  Eyes:     Pupils: Pupils are equal, round, and reactive to light.  Cardiovascular:     Rate and Rhythm: Normal rate and regular rhythm.     Pulses: Normal pulses.     Heart sounds: Normal heart sounds. No murmur heard.    No friction rub.  Pulmonary:     Effort: Pulmonary effort is normal.     Breath sounds: Normal breath sounds. No wheezing or rales.  Abdominal:     General: Bowel sounds are normal. There is no distension.     Palpations: Abdomen is soft.     Tenderness: There is no abdominal tenderness. There is no guarding or rebound.  Musculoskeletal:        General: Tenderness present.     Cervical back: Normal range of motion and neck supple.  Lumbar back: Normal.     Right hip: Normal.     Left hip: Tenderness present. Decreased range of motion.     Right knee: Normal.     Left knee: Normal.     Left ankle: Normal.     Comments: No edema  Skin:    General: Skin is warm and dry.     Findings: No rash.  Neurological:     Mental Status: She is alert and oriented to person, place, and time.     Cranial Nerves: No cranial nerve deficit.     Comments: 5 out of 5 strength in all 4 extremities.  Patient was able to get out of the wheelchair and stand and get into bed.  Psychiatric:        Behavior: Behavior normal.     (all labs ordered are listed, but only abnormal results are displayed) Labs Reviewed  CBG MONITORING, ED    EKG: None  Radiology: DG Hip Unilat W or Wo Pelvis 2-3 Views Left Result Date:  09/28/2023 EXAM: 2 OR MORE VIEW(S) XRAY OF THE LEFT HIP 09/28/2023 08:42:00 AM COMPARISON: None available. CLINICAL HISTORY: pain in the hip after fall. Left posterior hip pain after fall FINDINGS: BONES AND JOINTS: Focal sclerotic lesion with chondroid matrix within the intertrochanteric portions of the proximal left femur. This is most consistent with a benign enchondroma. No acute fracture. The hip joint is maintained. No significant degenerative changes. SOFT TISSUES: The soft tissues are unremarkable. IMPRESSION: 1. No acute fracture or dislocation. 2. Focal sclerotic lesion with chondroid matrix in the intertrochanteric proximal left femur, most consistent with a benign enchondroma. Electronically signed by: Waddell Calk MD 09/28/2023 08:54 AM EDT RP Workstation: [id number]     Procedures   Medications Ordered in the ED  ibuprofen  (ADVIL ) tablet 400 mg (has no administration in time range)  acetaminophen  (TYLENOL ) tablet 1,000 mg (1,000 mg Oral Given 09/28/23 0843)                                    Medical Decision Making Amount and/or Complexity of Data Reviewed Radiology: ordered and independent interpretation performed. Decision-making details documented in ED Course.  Risk OTC drugs. Prescription drug management.   Pt with multiple medical problems and comorbidities and presenting today with a complaint that caries a high risk for morbidity and mortality.  Here today with recurrent falls.  This has been an ongoing issue for this patient and she denies any injury to her head today.  She is complaining of pain in her left hip but she is able to stand and pivot.  Concern for pelvic fracture versus contusion.  No other evidence of injury, no midline spinal tenderness concerning for compression fracture.  She is neurologically intact at this time.  Pulses are normal.  Patient given pain control and imaging is pending.  9:01 AM Patient's blood sugar was normal today at 88, I have  independently visualized and interpreted pt's images today.  Hip imaging today without acute fracture.  Radiology reports no acute findings but a focal sclerotic lesion with chondroid matrix in the intertrochanteric proximal left femur most consistent with a benign endochondroma.  Findings discussed with the patient and her family.  She does have a cane but her family reports she does not use it enough.  She will use Tylenol  as needed for pain but at this time is stable for discharge.  Final diagnoses:  Fall, initial encounter  Contusion of left hip, initial encounter    ED Discharge Orders     None          Doretha Folks, MD 09/28/23 [phone]

## 2023-09-28 NOTE — ED Triage Notes (Signed)
 Patient states got up this morning to go to the bathroom and fell on her left hip. States she was dizzy and nauseous after fall.

## 2023-09-28 NOTE — ED Notes (Signed)
 Pt not wanting Ibuprofen  at this time. States it upsets her stomach and she hasn't eaten this morning. I let her know we can get her a snack after her scans come back and then she'll take the Ibuprofen .

## 2023-09-28 NOTE — ED Notes (Signed)
 Asked pt if she wanted the Ibuprofen  before she left and she stated she will just take some at home.

## 2023-10-04 NOTE — Progress Notes (Addendum)
 Zanna Hawn                                          MRN: [id number]   10/04/2023   The [facility] Quality Team Specialist reviewed this patient medical record for the purposes of chart review for care gap closure. The following were reviewed: chart review for care gap closure-kidney health evaluation for diabetes:eGFR  and uACR.  12/07/2023- no uACR  01/30/2024- cannot close KED 2025   [facility]I Quality Team

## 2023-10-11 NOTE — Telephone Encounter (Signed)
 LVM for patient to call back and discuss MR spine results

## 2023-10-18 NOTE — Telephone Encounter (Signed)
 MRI results show severe spinal stenosis at L3-4 and moderate to severe spinal stenosis at L4-5.  These are likely a major contributing factor to gait difficulty and fall risk.  May consider spine surgery consult if surgical options are considered, otherwise continue physical therapy.  Incidental benign renal cyst noted, may follow-up with PCP or urology.  I gave son, Midge and pt results.  They will call back on referral.  I relayed that results should be on mychart. He appreciated that and if not then will let us  know for copy.  Appreciated call back.   Currently getting PT outpt and also from friend at home.

## 2023-10-18 NOTE — Telephone Encounter (Signed)
-----   Message from Eduard JONELLE Hanlon sent at 10/11/2023  2:48 PM EDT ----- Please call patient with these results. She did not view the [employer]. -VRP ----- Message ----- From: Hanlon Eduard JONELLE, MD Sent: 09/22/2023  10:09 PM EDT To: Eduard JONELLE Hanlon, MD

## 2023-10-19 NOTE — Telephone Encounter (Signed)
 Orders Placed This Encounter  Procedures   Ambulatory referral to Orthopedic Surgery    Referral Priority:   Routine    Referral Type:   Surgical    Referral Reason:   Specialty Services Required    Requested Specialty:   Orthopedic Surgery    Number of Visits Requested:   1   EDUARD FABIENE HANLON, MD 10/19/2023, 5:34 PM Certified in Neurology, Neurophysiology and Neuroimaging  [facility] [address], [address] [place], [place] [address] [phone]

## 2023-10-19 NOTE — Addendum Note (Signed)
 Addended by: MARGARET CARNE R on: 10/19/2023 05:34 PM   Modules accepted: Orders

## 2023-10-19 NOTE — Telephone Encounter (Signed)
 Son, and pt called back.  They spoke to pts husband and are ok to see surgeon for consult.  Husband had used [facility] but willdefer to Dr. Chancy recommendation.

## 2023-10-20 ENCOUNTER — Telehealth: Payer: Self-pay | Admitting: Diagnostic Neuroimaging

## 2023-10-20 NOTE — Telephone Encounter (Signed)
 Referral to Orthopedic Surgery faxed to [facility]   [facility]  Phone: [phone] Fax:[phone]

## 2023-11-02 LAB — BASIC METABOLIC PANEL WITH GFR
BUN: 12 (ref 4–21)
CO2: 21 (ref 13–22)
Chloride: 103 (ref 99–108)
Creatinine: 1.1 (ref 0.5–1.1)
Glucose: 89
Potassium: 3.7 meq/L (ref 3.5–5.1)
Sodium: 140 (ref 137–147)

## 2023-11-02 LAB — VITAMIN D 25 HYDROXY (VIT D DEFICIENCY, FRACTURES): Vit D, 25-Hydroxy: 81.8

## 2023-11-02 LAB — HEMOGLOBIN A1C: Hemoglobin A1C: 5.9

## 2023-11-02 LAB — COMPREHENSIVE METABOLIC PANEL WITH GFR
Calcium: 9.4 (ref 8.7–10.7)
eGFR: 52

## 2023-11-07 DIAGNOSIS — E559 Vitamin D deficiency, unspecified: Secondary | ICD-10-CM | POA: Diagnosis not present

## 2023-11-07 DIAGNOSIS — G919 Hydrocephalus, unspecified: Secondary | ICD-10-CM | POA: Diagnosis not present

## 2023-11-07 DIAGNOSIS — M810 Age-related osteoporosis without current pathological fracture: Secondary | ICD-10-CM | POA: Diagnosis not present

## 2023-11-07 DIAGNOSIS — Z7952 Long term (current) use of systemic steroids: Secondary | ICD-10-CM | POA: Diagnosis not present

## 2023-11-07 DIAGNOSIS — E871 Hypo-osmolality and hyponatremia: Secondary | ICD-10-CM | POA: Diagnosis not present

## 2023-11-07 DIAGNOSIS — E2740 Unspecified adrenocortical insufficiency: Secondary | ICD-10-CM | POA: Diagnosis not present

## 2023-11-07 DIAGNOSIS — E119 Type 2 diabetes mellitus without complications: Secondary | ICD-10-CM | POA: Diagnosis not present

## 2023-11-07 DIAGNOSIS — E236 Other disorders of pituitary gland: Secondary | ICD-10-CM | POA: Diagnosis not present

## 2023-11-10 DIAGNOSIS — M545 Low back pain, unspecified: Secondary | ICD-10-CM | POA: Diagnosis not present

## 2023-11-13 ENCOUNTER — Telehealth: Payer: Self-pay | Admitting: *Deleted

## 2023-11-13 NOTE — Telephone Encounter (Signed)
 Received fax from Dr. Duwayne.  Request for pre operative clearance for pt TBD.  (Lumbar decompression/GA. Form in in box.  Pt last seen 09-06-2023.

## 2023-11-15 ENCOUNTER — Encounter: Payer: Self-pay | Admitting: Family Medicine

## 2023-12-10 NOTE — Progress Notes (Addendum)
 [facility] [employer] [address] [place], [place] [address] [phone]  Date:  12/13/2023   Name:  Heidi [name]   DOB:  Apr 26, 1950   MRN:  [id number]  PCP:  Watt Harlene BROCKS, MD    Chief Complaint: SURGERY CLEARANCE   History of Present Illness:  Heidi [name] is a 73 y.o. very pleasant female patient who presents with the following:  Patient seen today for a preoperative visit.  She is going to have some spine surgery per Dr. Dorette plans to do a lumbar decompression under general anesthesia.  Surgical date is not yet established I saw her most recently in July History of diet-controlled diabetes, hyponatremia, anemia, adrenal insufficiency, memory loss, osteoporosis Her endocrinologist is Dr. Tommas who treats her adrenal insufficiency  At her last visit she had noticed right sided foot drop for about a month She has been in the ER twice since her last visit with falls  She saw her neurologist Dr.Penumalli September 3 MEMORY LOSS (mild changes in ADLs (medications, meals), MMSE 23/30; possible MCI vs mild dementia) - check FDG PET and ATN panel - try to stay active physically and get some exercise (at least 15-30 minutes per day) - eat a nutritious diet with lean protein, plants / vegetables, whole grains; avoid ultra-processed foods - increase social activities, brain stimulation, games, puzzles, hobbies, crafts, arts, music; try new activities; keep it fun! - aim for at least 7-8 hours sleep per night (or more) - avoid smoking and alcohol - caution with medications, finances, driving GAIT DIFFICULTY (stooped posture) - check MRI lumbar spine (prior foot drop; rule out spinal stenosis or lumbar radiculopathy) - continue PT exercises; follow up exercise program at [facility] - fall precautions reviewed; consider cane / walker  There is an EKG on chart from August- possible old infarct, normal EKG in 2023 Can update  urine micro Eye exam- recommended  Foot exam- update today  A1c was recently updated per Dr Tommas  Lab Results  Component Value Date   HGBA1C 5.9 11/02/2023   Discussed the use of AI scribe software for clinical note transcription with the patient, who gave verbal consent to proceed.  History of Present Illness Heidi [name] is a 73 year old female who presents for preoperative evaluation for foot surgery.  She has been experiencing issues with her right foot, causing it to drag when walking since at least September. She reports falling when making fast turns. No back pain is associated with this issue, except for pain experienced during a fall.  An MRI of the lumbar spine completed-   IMPRESSION:  MRI lumbar spine (without) demonstrating: - At L3-4: disc bulging and facet hypertrophy with severe spinal stenosis and mild biforaminal stenosis. - At L4-5: disc bulging and facet hypertrophy with moderate-severe spinal stenosis and mild biforaminal stenosis. - Incidental right renal cyst measuring 3.4 cm.  The surgery has not yet been scheduled as she is awaiting preoperative clearance. She is unable to engage in physical activities such as walking around the neighborhood due to the foot problem, which has significantly impacted her mobility.  A previous EKG was performed in August at the ER and was reported to be mildly abnormal.  No back pain.    Patient Active Problem List   Diagnosis Date Noted   Impingement syndrome of left shoulder 10/09/2019   Osteopenia 10/03/2019   Memory loss 06/23/2017   Gait abnormality 06/23/2017  Uterine prolapse 09/24/2016   Headache 09/24/2016   Hyponatremia 03/03/2015   Special screening for malignant neoplasms, colon 12/10/2012   Lymphocytosis (symptomatic)    Disorder of bone and cartilage    Controlled type 2 diabetes mellitus without complication, without long-term current use of insulin (HCC)    Reflux esophagitis    Anemia,  unspecified    Dyslipidemia    Lymphocytosis 01/16/2012   Idiopathic eosinophilia 04/05/2011   NONSPECIFIC ABNORMAL RESULTS LIVR FUNCTION STUDY 08/15/2008   ANEMIA, HX OF 08/15/2008    Past Medical History:  Diagnosis Date   Allergic rhinitis due to pollen    Allergy    Anemia, unspecified    Blood transfusion without reported diagnosis    1979   Disorder of bone and cartilage, unspecified    Eosinophilia    Headache 09/24/2016   Lymphocytosis (symptomatic)    Osteoporosis    Other abnormal blood chemistry    Other and unspecified hyperlipidemia    Pain in joint, shoulder region    Reflux esophagitis    Type II or unspecified type diabetes mellitus without mention of complication, uncontrolled    Type II or unspecified type diabetes mellitus without mention of complication, uncontrolled    Umbilical hernia without mention of obstruction or gangrene    Unspecified disorder of liver     Past Surgical History:  Procedure Laterality Date   CATARACT EXTRACTION, BILATERAL     CESAREAN SECTION     COLONOSCOPY  2015   Dr. Debrah, normal exam    Social History   Tobacco Use   Smoking status: Never   Smokeless tobacco: Never  Vaping Use   Vaping status: Never Used  Substance Use Topics   Alcohol use: No   Drug use: No    Family History  Problem Relation Age of Onset   Pancreatic cancer Neg Hx    Colon cancer Neg Hx    Esophageal cancer Neg Hx    Stomach cancer Neg Hx    Colon polyps Neg Hx    Rectal cancer Neg Hx     Allergies  Allergen Reactions   Ciprofloxacin Other (See Comments)    Patient states she gets CDIFF Family states this is not correct and this is not an allergy   Alendronate Sodium Other (See Comments)    Other Reaction(s): Unknown fosamax- pt not sure of reaction    Medication list has been reviewed and updated.  Current Outpatient Medications on File Prior to Visit  Medication Sig Dispense Refill   calcium -vitamin D  (OSCAL WITH D)  500-200 MG-UNIT per tablet Take 1 tablet by mouth daily.     CEQUA  0.09 % SOLN Apply 1 drop to eye 2 (two) times daily. 180 each 3   cetirizine (ZYRTEC) 10 MG chewable tablet Chew 10 mg by mouth daily.     docusate (COLACE) 50 MG/5ML liquid Take by mouth daily.     Multiple Vitamin (MULTIVITAMIN) tablet Take 1 tablet by mouth daily.     simvastatin  (ZOCOR ) 20 MG tablet Take 1 tablet (20 mg total) by mouth at bedtime. 90 tablet 3   traZODone  (DESYREL ) 50 MG tablet TAKE 1/2 TO 1 (ONE-HALF TO ONE) TABLET BY MOUTH AT BEDTIME FOR SLEEP (Patient taking differently: as needed.)     No current facility-administered medications on file prior to visit.    Review of Systems:  As per HPI- otherwise negative.   Physical Examination: Vitals:   12/13/23 1014  BP: 126/76  Pulse: 76  Resp: 16  Temp: 98 F (36.7 C)  SpO2: 99%   Vitals:   12/13/23 1014  Weight: 168 lb 8 oz (76.4 kg)  Height: 5' 2 (1.575 m)   Body mass index is 30.82 kg/m. Ideal Body Weight: Weight in (lb) to have BMI = 25: 136.4  GEN: no acute distress.  Mild obesity, looks well  HEENT: Atraumatic, Normocephalic.  Ears and Nose: No external deformity. CV: RRR, No M/G/R. No JVD. No thrill. No extra heart sounds. PULM: CTA B, no wheezes, crackles, rhonchi. No retractions. No resp. distress. No accessory muscle use. EXTR: No c/c/e PSYCH: Normally interactive. Conversant.    Assessment and Plan: Preoperative cardiovascular examination - Plan: Myocardial Perfusion Imaging, Cardiac Stress Test: Informed Consent Details: Physician/Practitioner Attestation; Transcribe to consent form and obtain patient signature  Assessment & Plan Preoperative cardiovascular evaluation for right foot drop surgery Preoperative cardiovascular evaluation required for right foot drop surgery. Previous EKG showed mild abnormalities, no significant cardiac history. Limited exercise capacity due to foot drop. Surgery recommended to improve mobility  and prevent worsening. Explained risks and benefits, emphasizing shared decision-making. - Arranged non-exercise stress test to evaluate cardiac function for surgery. - Will schedule surgery pending favorable stress test results.  Signed Harlene Schroeder, MD 12/24 Received her myoview , normal  Signed pre-op form  "

## 2023-12-13 ENCOUNTER — Encounter: Payer: Self-pay | Admitting: Family Medicine

## 2023-12-13 ENCOUNTER — Ambulatory Visit: Admitting: Family Medicine

## 2023-12-13 VITALS — BP 126/76 | HR 76 | Temp 98.0°F | Resp 16 | Ht 62.0 in | Wt 168.5 lb

## 2023-12-13 DIAGNOSIS — Z01810 Encounter for preprocedural cardiovascular examination: Secondary | ICD-10-CM

## 2023-12-13 NOTE — Patient Instructions (Signed)
 Good to see you- I will set up your heart test.  Please let me know if you don't hear about this appointment in the next week or so.  Assuming you do well we will get you cleared for the operation!   Be sure to keep up with your annual eye exam

## 2023-12-22 ENCOUNTER — Telehealth (HOSPITAL_COMMUNITY): Payer: Self-pay | Admitting: *Deleted

## 2023-12-22 ENCOUNTER — Other Ambulatory Visit: Payer: Self-pay | Admitting: Family Medicine

## 2023-12-22 DIAGNOSIS — Z01810 Encounter for preprocedural cardiovascular examination: Secondary | ICD-10-CM

## 2023-12-22 NOTE — Telephone Encounter (Signed)
 Left message on voicemail per DPR in reference to upcoming appointment scheduled on 12/25/2023 at 2:30 with detailed instructions given per Myocardial Perfusion Study Information Sheet for the test. LM to arrive 15 minutes early, and that it is imperative to arrive on time for appointment to keep from having the test rescheduled. If you need to cancel or reschedule your appointment, please call the office within 24 hours of your appointment. Failure to do so may result in a cancellation of your appointment, and a $50 no show fee. Phone number given for call back for any questions.

## 2023-12-25 ENCOUNTER — Ambulatory Visit (HOSPITAL_COMMUNITY)
Admission: RE | Admit: 2023-12-25 | Discharge: 2023-12-25 | Disposition: A | Discharge status: Home / Self Care | Source: Ambulatory Visit | Attending: Family Medicine | Admitting: Family Medicine

## 2023-12-25 DIAGNOSIS — Z01810 Encounter for preprocedural cardiovascular examination: Secondary | ICD-10-CM | POA: Insufficient documentation

## 2023-12-25 LAB — MYOCARDIAL PERFUSION IMAGING
LV dias vol: 48 mL (ref 46–106)
LV sys vol: 8 mL
Nuc Stress EF: 83 %
Peak HR: 109 {beats}/min
Rest HR: 65 {beats}/min
Rest Nuclear Isotope Dose: 10.3 mCi
SDS: 1
SRS: 1
SSS: 1
ST Depression (mm): 0 mm
Stress Nuclear Isotope Dose: 31.6 mCi
TID: 1.09

## 2023-12-25 MED ORDER — TECHNETIUM TC 99M TETROFOSMIN IV KIT
31.6000 | PACK | Freq: Once | INTRAVENOUS | Status: AC | PRN
  Administered 2023-12-25: 31.6 via INTRAVENOUS

## 2023-12-25 MED ORDER — TECHNETIUM TC 99M TETROFOSMIN IV KIT
10.3000 | PACK | Freq: Once | INTRAVENOUS | Status: AC | PRN
  Administered 2023-12-25: 10.3 via INTRAVENOUS

## 2023-12-25 MED ORDER — REGADENOSON 0.4 MG/5ML IV SOLN
INTRAVENOUS | Status: AC
  Filled 2023-12-25: qty 5

## 2023-12-25 MED ORDER — REGADENOSON 0.4 MG/5ML IV SOLN
0.4000 mg | Freq: Once | INTRAVENOUS | Status: AC
  Administered 2023-12-25: 0.4 mg via INTRAVENOUS

## 2023-12-26 ENCOUNTER — Encounter: Payer: Self-pay | Admitting: Family Medicine

## 2023-12-27 ENCOUNTER — Encounter: Payer: Self-pay | Admitting: Family Medicine

## 2024-01-16 ENCOUNTER — Ambulatory Visit: Payer: Self-pay | Admitting: Orthopedic Surgery

## 2024-01-16 DIAGNOSIS — M48062 Spinal stenosis, lumbar region with neurogenic claudication: Secondary | ICD-10-CM

## 2024-01-23 ENCOUNTER — Encounter (HOSPITAL_COMMUNITY): Payer: Self-pay

## 2024-01-23 NOTE — Pre-Procedure Instructions (Signed)
 Surgical Instructions   Your procedure is scheduled on February 01, 2024. Report to [facility] Main Entrance A at 5:30 A.M., then check in with the Admitting office. Any questions or running late day of surgery: call [phone]  Questions prior to your surgery date: call [phone], Monday-Friday, 8am-4pm. If you experience any cold or flu symptoms such as cough, fever, chills, shortness of breath, etc. between now and your scheduled surgery, please notify us  at the above number.     Remember:  Do not eat after midnight the night before your surgery  You may drink clear liquids until 4:30 AM the morning of your surgery.   Clear liquids allowed are: Water, Non-Citrus Juices (without pulp), Carbonated Beverages, Clear Tea (no milk, honey, etc.), Black Coffee Only (NO MILK, CREAM OR POWDERED CREAMER of any kind), and Gatorade.  Patient Instructions  The night before surgery:  No food after midnight. ONLY clear liquids after midnight  The day of surgery (if you do NOT have diabetes):  Drink ONE (1) Pre-Surgery Clear Ensure by 4:30 AM the morning of surgery. Drink in one sitting. Do not sip.  This drink was given to you during your hospital  pre-op appointment visit.  Nothing else to drink after completing the  Pre-Surgery Clear Ensure.         If you have questions, please contact your surgeons office.    Take these medicines the morning of surgery with A SIP OF WATER: CEQUA  eye drops cetirizine (ZYRTEC)  docusate sodium (COLACE)    One week prior to surgery, STOP taking any Aspirin (unless otherwise instructed by your surgeon) Aleve, Naproxen, Ibuprofen , Motrin , Advil , Goody's, BC's, all herbal medications, fish oil, and non-prescription vitamins.                     Do NOT Smoke (Tobacco/Vaping) for 24 hours prior to your procedure.  If you use a CPAP at night, you may bring your mask/headgear for your overnight stay.   You will be asked to remove any contacts,  glasses, piercing's, hearing aid's, dentures/partials prior to surgery. Please bring cases for these items if needed.    Your surgeon will determine if you are to be admitted or discharged the same day.  Patients discharged the day of surgery will not be allowed to drive home, and someone needs to stay with them for 24 hours.  SURGICAL WAITING ROOM VISITATION Patients may have no more than 2 support people in the waiting area - these visitors may rotate.   Pre-op nurse will coordinate an appropriate time for 2 ADULT support persons, who may not rotate, to accompany patient in pre-op.  Children under the age of 78 must have an adult with them who is not the patient and must remain in the main waiting area with an adult.  If the patient needs to stay at the hospital during part of their recovery, the visitor guidelines for inpatient rooms apply.  Please refer to the [facility] website for the visitor guidelines for any additional information.   If you received a COVID test during your pre-op visit  it is requested that you wear a mask when out in public, stay away from anyone that may not be feeling well and notify your surgeon if you develop symptoms. If you have been in contact with anyone that has tested positive in the last 10 days please notify you surgeon.      Pre-operative 4 CHG Bathing Instructions   You  can play a key role in reducing the risk of infection after surgery. Your skin needs to be as free of germs as possible. You can reduce the number of germs on your skin by washing with CHG (chlorhexidine gluconate) soap before surgery. CHG is an antiseptic soap that kills germs and continues to kill germs even after washing.   DO NOT use if you have an allergy to chlorhexidine/CHG or antibacterial soaps. If your skin becomes reddened or irritated, stop using the CHG and notify one of our RNs at [phone].   Please shower with the CHG soap starting 4 days before surgery using the  following schedule:     Please keep in mind the following:  DO NOT shave, including legs and underarms, starting the day of your first shower.   You may shave your face at any point before/day of surgery.  Place clean sheets on your bed the day you start using CHG soap. Use a clean washcloth (not used since being washed) for each shower. DO NOT sleep with pets once you start using the CHG.   CHG Shower Instructions:  Wash your face and private area with normal soap. If you choose to wash your hair, wash first with your normal shampoo.  After you use shampoo/soap, rinse your hair and body thoroughly to remove shampoo/soap residue.  Turn the water OFF and apply  bottle of CHG soap to a CLEAN washcloth.  Apply CHG soap ONLY FROM YOUR NECK DOWN TO YOUR TOES (washing for 3-5 minutes)  DO NOT use CHG soap on face, private areas, open wounds, or sores.  Pay special attention to the area where your surgery is being performed.  If you are having back surgery, having someone wash your back for you may be helpful. Wait 2 minutes after CHG soap is applied, then you may rinse off the CHG soap.  Pat dry with a clean towel  Put on clean clothes/pajamas   If you choose to wear lotion, please use ONLY the CHG-compatible lotions that are listed below.  Additional instructions for the day of surgery:  If you choose, you may shower the morning of surgery with an antibacterial soap.  DO NOT APPLY any lotions, deodorants, cologne, or perfumes.   Do not bring valuables to the hospital. [facility] is not responsible for any belongings/valuables. Do not wear nail polish, gel polish, artificial nails, or any other type of covering on natural nails (fingers and toes) Do not wear jewelry or makeup Put on clean/comfortable clothes.  Please brush your teeth.  Ask your nurse before applying any prescription medications to the skin.     CHG Compatible Lotions   Aveeno Moisturizing lotion  Cetaphil  Moisturizing Cream  Cetaphil Moisturizing Lotion  Clairol Herbal Essence Moisturizing Lotion, Dry Skin  Clairol Herbal Essence Moisturizing Lotion, Extra Dry Skin  Clairol Herbal Essence Moisturizing Lotion, Normal Skin  Curel Age Defying Therapeutic Moisturizing Lotion with Alpha Hydroxy  Curel Extreme Care Body Lotion  Curel Soothing Hands Moisturizing Hand Lotion  Curel Therapeutic Moisturizing Cream, Fragrance-Free  Curel Therapeutic Moisturizing Lotion, Fragrance-Free  Curel Therapeutic Moisturizing Lotion, Original Formula  Eucerin Daily Replenishing Lotion  Eucerin Dry Skin Therapy Plus Alpha Hydroxy Crme  Eucerin Dry Skin Therapy Plus Alpha Hydroxy Lotion  Eucerin Original Crme  Eucerin Original Lotion  Eucerin Plus Crme Eucerin Plus Lotion  Eucerin TriLipid Replenishing Lotion  Keri Anti-Bacterial Hand Lotion  Keri Deep Conditioning Original Lotion Dry Skin Formula Softly Scented  Keri Deep Conditioning  Mikael Memory, Fragrance Free Sensitive Skin Formula  Keri Lotion Fast Absorbing Fragrance Free Sensitive Skin Formula  Keri Lotion Fast Absorbing Softly Scented Dry Skin Formula  Keri Original Lotion  Keri Skin Renewal Lotion Keri Silky Smooth Lotion  Keri Silky Smooth Sensitive Skin Lotion  Nivea Body Creamy Conditioning Oil  Nivea Body Extra Enriched Lotion  Nivea Body Original Lotion  Nivea Body Sheer Moisturizing Lotion Nivea Crme  Nivea Skin Firming Lotion  NutraDerm 30 Skin Lotion  NutraDerm Skin Lotion  NutraDerm Therapeutic Skin Cream  NutraDerm Therapeutic Skin Lotion  ProShield Protective Hand Cream  Provon moisturizing lotion  Please read over the following fact sheets that you were given.

## 2024-01-24 ENCOUNTER — Inpatient Hospital Stay (HOSPITAL_COMMUNITY)
Admission: RE | Admit: 2024-01-24 | Discharge: 2024-01-24 | Disposition: A | Discharge status: Home / Self Care | Source: Ambulatory Visit

## 2024-01-24 HISTORY — DX: Peripheral vascular disease, unspecified: I73.9

## 2024-01-24 HISTORY — DX: Prediabetes: R73.03

## 2024-01-31 ENCOUNTER — Encounter (HOSPITAL_COMMUNITY)
Admission: RE | Admit: 2024-01-31 | Discharge: 2024-01-31 | Disposition: A | Source: Ambulatory Visit | Attending: Specialist | Admitting: Specialist

## 2024-01-31 ENCOUNTER — Ambulatory Visit: Payer: Self-pay | Admitting: Orthopedic Surgery

## 2024-01-31 ENCOUNTER — Other Ambulatory Visit: Payer: Self-pay

## 2024-01-31 ENCOUNTER — Encounter (HOSPITAL_COMMUNITY): Payer: Self-pay

## 2024-01-31 ENCOUNTER — Ambulatory Visit (HOSPITAL_COMMUNITY)
Admission: RE | Admit: 2024-01-31 | Discharge: 2024-01-31 | Disposition: A | Discharge status: Home / Self Care | Source: Ambulatory Visit | Attending: Orthopedic Surgery | Admitting: Orthopedic Surgery

## 2024-01-31 VITALS — BP 152/75 | HR 69 | Temp 97.9°F | Resp 16 | Ht 62.0 in | Wt 170.1 lb

## 2024-01-31 DIAGNOSIS — M48062 Spinal stenosis, lumbar region with neurogenic claudication: Secondary | ICD-10-CM

## 2024-01-31 DIAGNOSIS — Z01818 Encounter for other preprocedural examination: Secondary | ICD-10-CM | POA: Insufficient documentation

## 2024-01-31 DIAGNOSIS — Z7952 Long term (current) use of systemic steroids: Secondary | ICD-10-CM | POA: Insufficient documentation

## 2024-01-31 DIAGNOSIS — E2740 Unspecified adrenocortical insufficiency: Secondary | ICD-10-CM | POA: Diagnosis not present

## 2024-01-31 HISTORY — DX: Unspecified adrenocortical insufficiency: E27.40

## 2024-01-31 LAB — CBC
HCT: 39.8 % (ref 36.0–46.0)
Hemoglobin: 13.2 g/dL (ref 12.0–15.0)
MCH: 28.3 pg (ref 26.0–34.0)
MCHC: 33.2 g/dL (ref 30.0–36.0)
MCV: 85.2 fL (ref 80.0–100.0)
Platelets: 288 10*3/uL (ref 150–400)
RBC: 4.67 MIL/uL (ref 3.87–5.11)
RDW: 12.6 % (ref 11.5–15.5)
WBC: 8.2 10*3/uL (ref 4.0–10.5)
nRBC: 0 % (ref 0.0–0.2)

## 2024-01-31 LAB — BASIC METABOLIC PANEL WITH GFR
Anion gap: 12 (ref 5–15)
BUN: 12 mg/dL (ref 8–23)
CO2: 27 mmol/L (ref 22–32)
Calcium: 9.1 mg/dL (ref 8.9–10.3)
Chloride: 104 mmol/L (ref 98–111)
Creatinine, Ser: 0.95 mg/dL (ref 0.44–1.00)
GFR, Estimated: >60 mL/min
Glucose, Bld: 89 mg/dL (ref 70–99)
Potassium: 3.7 mmol/L (ref 3.5–5.1)
Sodium: 143 mmol/L (ref 135–145)

## 2024-01-31 LAB — SURGICAL PCR SCREEN
MRSA, PCR: NEGATIVE
Staphylococcus aureus: NEGATIVE

## 2024-01-31 LAB — HEMOGLOBIN A1C
Hgb A1c MFr Bld: 6 % — ABNORMAL HIGH (ref 4.8–5.6)
Mean Plasma Glucose: 125.5 mg/dL

## 2024-01-31 NOTE — Pre-Procedure Instructions (Signed)
 Surgical Instructions   Your procedure is scheduled on February 01, 2024. Report to [facility] Main Entrance A at 5:30 A.M., then check in with the Admitting office. Any questions or running late day of surgery: call [phone]  Questions prior to your surgery date: call [phone], Monday-Friday, 8am-4pm. If you experience any cold or flu symptoms such as cough, fever, chills, shortness of breath, etc. between now and your scheduled surgery, please notify us  at the above number.     Remember:  Do not eat after midnight the night before your surgery  You may drink clear liquids until 4:30 AM the morning of your surgery.   Clear liquids allowed are: Water, Non-Citrus Juices (without pulp), Carbonated Beverages, Clear Tea (no milk, honey, etc.), Black Coffee Only (NO MILK, CREAM OR POWDERED CREAMER of any kind), and Gatorade.  Patient Instructions  The night before surgery:  No food after midnight. ONLY clear liquids after midnight  The day of surgery (if you do NOT have diabetes):  Drink ONE (1) Pre-Surgery Clear Ensure by 4:30 AM the morning of surgery. Drink in one sitting. Do not sip.  This drink was given to you during your hospital  pre-op appointment visit.  Nothing else to drink after completing the  Pre-Surgery Clear Ensure.         If you have questions, please contact your surgeons office.    Take these medicines the morning of surgery with A SIP OF WATER: CEQUA  eye drops cetirizine (ZYRTEC)  docusate sodium  (COLACE)  hydrocortisone  (CORTEF )   One week prior to surgery, STOP taking any Aspirin (unless otherwise instructed by your surgeon) Aleve, Naproxen, Ibuprofen , Motrin , Advil , Goody's, BC's, all herbal medications, fish oil, and non-prescription vitamins.                     Do NOT Smoke (Tobacco/Vaping) for 24 hours prior to your procedure.  If you use a CPAP at night, you may bring your mask/headgear for your overnight stay.   You will be asked to  remove any contacts, glasses, piercing's, hearing aid's, dentures/partials prior to surgery. Please bring cases for these items if needed.    Your surgeon will determine if you are to be admitted or discharged the same day.  Patients discharged the day of surgery will not be allowed to drive home, and someone needs to stay with them for 24 hours.  SURGICAL WAITING ROOM VISITATION Patients may have no more than 2 support people in the waiting area - these visitors may rotate.   Pre-op nurse will coordinate an appropriate time for 2 ADULT support persons, who may not rotate, to accompany patient in pre-op.  Children under the age of 66 must have an adult with them who is not the patient and must remain in the main waiting area with an adult.  If the patient needs to stay at the hospital during part of their recovery, the visitor guidelines for inpatient rooms apply.  Please refer to the [facility] website for the visitor guidelines for any additional information.   If you received a COVID test during your pre-op visit  it is requested that you wear a mask when out in public, stay away from anyone that may not be feeling well and notify your surgeon if you develop symptoms. If you have been in contact with anyone that has tested positive in the last 10 days please notify you surgeon.      Pre-operative 4 CHG Bathing Instructions  You can play a key role in reducing the risk of infection after surgery. Your skin needs to be as free of germs as possible. You can reduce the number of germs on your skin by washing with CHG (chlorhexidine  gluconate) soap before surgery. CHG is an antiseptic soap that kills germs and continues to kill germs even after washing.   DO NOT use if you have an allergy to chlorhexidine /CHG or antibacterial soaps. If your skin becomes reddened or irritated, stop using the CHG and notify one of our RNs at [phone].   Please shower with the CHG soap starting 4 days  before surgery using the following schedule:     Please keep in mind the following:  DO NOT shave, including legs and underarms, starting the day of your first shower.   You may shave your face at any point before/day of surgery.  Place clean sheets on your bed the day you start using CHG soap. Use a clean washcloth (not used since being washed) for each shower. DO NOT sleep with pets once you start using the CHG.   CHG Shower Instructions:  Wash your face and private area with normal soap. If you choose to wash your hair, wash first with your normal shampoo.  After you use shampoo/soap, rinse your hair and body thoroughly to remove shampoo/soap residue.  Turn the water OFF and apply  bottle of CHG soap to a CLEAN washcloth.  Apply CHG soap ONLY FROM YOUR NECK DOWN TO YOUR TOES (washing for 3-5 minutes)  DO NOT use CHG soap on face, private areas, open wounds, or sores.  Pay special attention to the area where your surgery is being performed.  If you are having back surgery, having someone wash your back for you may be helpful. Wait 2 minutes after CHG soap is applied, then you may rinse off the CHG soap.  Pat dry with a clean towel  Put on clean clothes/pajamas   If you choose to wear lotion, please use ONLY the CHG-compatible lotions that are listed below.  Additional instructions for the day of surgery:  If you choose, you may shower the morning of surgery with an antibacterial soap.  DO NOT APPLY any lotions, deodorants, cologne, or perfumes.   Do not bring valuables to the hospital. [facility] is not responsible for any belongings/valuables. Do not wear nail polish, gel polish, artificial nails, or any other type of covering on natural nails (fingers and toes) Do not wear jewelry or makeup Put on clean/comfortable clothes.  Please brush your teeth.  Ask your nurse before applying any prescription medications to the skin.     CHG Compatible Lotions   Aveeno Moisturizing  lotion  Cetaphil Moisturizing Cream  Cetaphil Moisturizing Lotion  Clairol Herbal Essence Moisturizing Lotion, Dry Skin  Clairol Herbal Essence Moisturizing Lotion, Extra Dry Skin  Clairol Herbal Essence Moisturizing Lotion, Normal Skin  Curel Age Defying Therapeutic Moisturizing Lotion with Alpha Hydroxy  Curel Extreme Care Body Lotion  Curel Soothing Hands Moisturizing Hand Lotion  Curel Therapeutic Moisturizing Cream, Fragrance-Free  Curel Therapeutic Moisturizing Lotion, Fragrance-Free  Curel Therapeutic Moisturizing Lotion, Original Formula  Eucerin Daily Replenishing Lotion  Eucerin Dry Skin Therapy Plus Alpha Hydroxy Crme  Eucerin Dry Skin Therapy Plus Alpha Hydroxy Lotion  Eucerin Original Crme  Eucerin Original Lotion  Eucerin Plus Crme Eucerin Plus Lotion  Eucerin TriLipid Replenishing Lotion  Keri Anti-Bacterial Hand Lotion  Keri Deep Conditioning Original Lotion Dry Skin Formula Softly Scented  Keri Deep  Conditioning Original Lotion, Fragrance Free Sensitive Skin Formula  Keri Lotion Fast Absorbing Fragrance Free Sensitive Skin Formula  Keri Lotion Fast Absorbing Softly Scented Dry Skin Formula  Keri Original Lotion  Keri Skin Renewal Lotion Keri Silky Smooth Lotion  Keri Silky Smooth Sensitive Skin Lotion  Nivea Body Creamy Conditioning Oil  Nivea Body Extra Enriched Lotion  Nivea Body Original Lotion  Nivea Body Sheer Moisturizing Lotion Nivea Crme  Nivea Skin Firming Lotion  NutraDerm 30 Skin Lotion  NutraDerm Skin Lotion  NutraDerm Therapeutic Skin Cream  NutraDerm Therapeutic Skin Lotion  ProShield Protective Hand Cream  Provon moisturizing lotion  Please read over the following fact sheets that you were given.

## 2024-01-31 NOTE — H&P (View-Only) (Signed)
 Heidi [name] is an 74 y.o. female.   Chief Complaint: back pain HPI: Location: low back Aggravating Factors: walking Associated Symptoms: weakness; c/o difficulties walking Prior Imaging: MRI (lumbar and left hip-[facility]) Patient presents for an opinion for progressive neurogenic claudication secondary to severe spinal stenosis. Patient reports progressive loss of ambulatory range over the past 3 years. She presents with her husband and daughter. She has hydrocephalus. She also had hyponatremia felt to be adrenal related. He is on supplemental cortisol. She has had ABIs 0.93 on the right and over 1 on the left. For 6 months she has had occasional urinary incontinence none recently. She has had 3 rounds of physical therapy. Is following. Has no upper extremity symptoms. Or weakness in the legs that is relieved when she sits  Past Medical History:  Diagnosis Date   Adrenal insufficiency    Allergic rhinitis due to pollen    Allergy    Anemia, unspecified    Blood transfusion without reported diagnosis    1979   Disorder of bone and cartilage, unspecified    Eosinophilia    Headache 09/24/2016   Lymphocytosis (symptomatic)    Osteoporosis    Other abnormal blood chemistry    Other and unspecified hyperlipidemia    Pain in joint, shoulder region    Peripheral vascular disease    Pre-diabetes    Reflux esophagitis    Umbilical hernia without mention of obstruction or gangrene    Unspecified disorder of liver     Past Surgical History:  Procedure Laterality Date   CATARACT EXTRACTION, BILATERAL     CESAREAN SECTION     COLONOSCOPY  2015   Dr. Debrah, normal exam    Family History  Problem Relation Age of Onset   Pancreatic cancer Neg Hx    Colon cancer Neg Hx    Esophageal cancer Neg Hx    Stomach cancer Neg Hx    Colon polyps Neg Hx    Rectal cancer Neg Hx    Social History:  reports that she has never smoked. She has never used smokeless tobacco. She reports  that she does not drink alcohol and does not use drugs.  Allergies: Allergies[1]  Current meds: atovaquone 250 mg-proguaniL 100 mg tablet hydrocortisone  10 mg tablet simvastatin  20 mg tablet sodium,potassium,mag sulfates 17.5 gram-3.13 gram-1.6 gram oral soln   Results for orders placed or performed during the hospital encounter of 01/31/24 (from the past 48 hours)  Surgical pcr screen     Status: None   Collection Time: 01/31/24 11:33 AM   Specimen: Nasal Mucosa; Nasal Swab  Result Value Ref Range   MRSA, PCR NEGATIVE NEGATIVE   Staphylococcus aureus NEGATIVE NEGATIVE    Comment: (NOTE) The Xpert SA Assay (FDA approved for NASAL specimens in patients 11 years of age and older), is one component of a comprehensive surveillance program. It is not intended to diagnose infection nor to guide or monitor treatment. Performed at [facility], [address], [place], [place] [address]   Basic metabolic panel per protocol     Status: None   Collection Time: 01/31/24 12:00 PM  Result Value Ref Range   Sodium 143 135 - 145 mmol/L   Potassium 3.7 3.5 - 5.1 mmol/L   Chloride 104 98 - 111 mmol/L   CO2 27 22 - 32 mmol/L   Glucose, Bld 89 70 - 99 mg/dL    Comment: Glucose reference range applies only to samples taken after fasting for at  least 8 hours.   BUN 12 8 - 23 mg/dL   Creatinine, Ser 9.04 0.44 - 1.00 mg/dL   Calcium  9.1 8.9 - 10.3 mg/dL   GFR, Estimated >39 >39 mL/min    Comment: (NOTE) Calculated using the CKD-EPI Creatinine Equation (2021)    Anion gap 12 5 - 15    Comment: Performed at [facility], [address] [place], [place] [address]  CBC per protocol     Status: None   Collection Time: 01/31/24 12:00 PM  Result Value Ref Range   WBC 8.2 4.0 - 10.5 K/uL   RBC 4.67 3.87 - 5.11 MIL/uL   Hemoglobin 13.2 12.0 - 15.0 g/dL   HCT 60.1 63.9 - 53.9 %   MCV 85.2 80.0 - 100.0 fL   MCH 28.3 26.0 - 34.0 pg   MCHC 33.2 30.0 - 36.0 g/dL   RDW 87.3 88.4 - 84.4 %    Platelets 288 150 - 400 K/uL   nRBC 0.0 0.0 - 0.2 %    Comment: Performed at [facility], [address] [place], [place] [address]  Hemoglobin A1c     Status: Abnormal   Collection Time: 01/31/24 12:00 PM  Result Value Ref Range   Hgb A1c MFr Bld 6.0 (H) 4.8 - 5.6 %    Comment: (NOTE) Diagnosis of Diabetes The following HbA1c ranges recommended by the American Diabetes Association (ADA) may be used as an aid in the diagnosis of diabetes mellitus.  Hemoglobin             Suggested A1C NGSP%              Diagnosis  <5.7                   Non Diabetic  5.7-6.4                Pre-Diabetic  >6.4                   Diabetic  <7.0                   Glycemic control for                       adults with diabetes.     Mean Plasma Glucose 125.5 mg/dL    Comment: Performed at [facility], [address], [place], [place] [address]   No results found.  Review of Systems  Constitutional: Negative.   HENT: Negative.    Eyes: Negative.   Respiratory: Negative.    Cardiovascular: Negative.   Gastrointestinal: Negative.   Endocrine: Negative.   Genitourinary: Negative.   Musculoskeletal:  Positive for back pain and gait problem.  Neurological:  Positive for weakness and numbness.  Psychiatric/Behavioral: Negative.      There were no vitals taken for this visit. Physical Exam Constitutional:      Appearance: Normal appearance.  HENT:     Head: Normocephalic and atraumatic.     Right Ear: External ear normal.     Left Ear: External ear normal.     Nose: Nose normal.     Mouth/Throat:     Pharynx: Oropharynx is clear.  Eyes:     Conjunctiva/sclera: Conjunctivae normal.  Cardiovascular:     Rate and Rhythm: Normal rate.     Pulses: Normal pulses.  Pulmonary:     Effort: Pulmonary effort is normal.  Abdominal:  General: Abdomen is flat.  Musculoskeletal:     Cervical back: Normal range of motion.     Comments: Gait and Station: Appearance: ambulating  with no assistive devices and antalgic gait.  Constitutional: General Appearance: healthy-appearing and NAD.  Psychiatric: Mood and Affect: active and alert.  Cardiovascular System: Edema Right: none; Dorsalis and posterior tibial pulses 2+. Edema Left: none.  Abdomen: Inspection and Palpation: non-distended and no tenderness.  Skin: Inspection and palpation: no rash.  Lumbar Spine: Inspection: normal alignment. Bony Palpation of the Lumbar Spine: tender at lumbosacral junction.. Bony Palpation of the Right Hip: no tenderness of the greater trochanter and tenderness of the SI joint; Pelvis stable. Bony Palpation of the Left Hip: no tenderness of the greater trochanter and tenderness of the SI joint. Soft Tissue Palpation on the Right: No flank pain with percussion. Active Range of Motion: limited flexion and extention.  Motor Strength: L1 Motor Strength on the Right: hip flexion iliopsoas 5/5. L1 Motor Strength on the Left: hip flexion iliopsoas 5/5. L2-L4 Motor Strength on the Right: knee extension quadriceps 5/5. L2-L4 Motor Strength on the Left: knee extension quadriceps 5/5. L5 Motor Strength on the Right: ankle dorsiflexion tibialis anterior 5/5 and great toe extension extensor hallucis longus 4/5. L5 Motor Strength on the Left: ankle dorsiflexion tibialis anterior 5/5 and great toe extension extensor hallucis longus 4/5. S1 Motor Strength on the Right: plantar flexion gastrocnemius 5/5. S1 Motor Strength on the Left: plantar flexion gastrocnemius 5/5.  Neurological System: Knee Reflex Right: normal (2). Knee Reflex Left: normal (2). Ankle Reflex Right: normal (2). Ankle Reflex Left: normal (2). Babinski Reflex Right: plantar reflex absent. Babinski Reflex Left: plantar reflex absent. Sensation on the Right: normal distal extremities. Sensation on the Left: normal distal extremities. Special Tests on the Right: no clonus of the ankle/knee and seated straight leg raising test positive. Special  Tests on the Left: no clonus of the ankle/knee.  No Babinski or clonus. Upper extremities 5/5 in the upper extremities normoreflexic sensory exams intact  Skin:    General: Skin is warm and dry.  Neurological:     Mental Status: She is alert.    Three-view x-rays demonstrate displaced narrowing there is mild L4-5 L5-S1 some calcification of the aorta. No instability in flexion extension. Hips are unremarkable.  Outside MRI DRI independently reviewed by myself demonstrates severe multifactorial spinal stenosis at L3-4. Due to severe ligamentum flavum hypertrophy and facet hypertrophy. Epidural lipomatosis is noted. Moderate to severe spinal stenosis at L4-5. Epidural lipomatosis noted predominantly at L5-S1  Assessment/Plan Discussed procedure, risks, complications, alternatives. Pt desires to proceed with decompression L3-4, L4-5  Darice CHRISTELLA Randy, PA-C for Dr duwayne 01/31/2024, 8:34 PM       [1]  Allergies Allergen Reactions   Ciprofloxacin Other (See Comments)    Patient states she gets CDIFF Family states this is not correct and this is not an allergy   Alendronate Sodium Other (See Comments)    Other Reaction(s): Unknown fosamax- pt not sure of reaction

## 2024-01-31 NOTE — Progress Notes (Signed)
 PCP - Harlene Copland,MD Cardiologist - denies Endocrinologist - Balan Bindubal,MD  PPM/ICD - denies Device Orders -  Rep Notified -   Chest x-ray - na EKG - 08/28/23 Stress Test - 12/25/23 ECHO - denies Cardiac Cath - denies  Sleep Study - denies CPAP - no  Fasting Blood Sugar - pt states she has pre diabetes and she does not check her sugar or have a meter.  Checks Blood Sugar _____ times a day  Last dose of GLP1 agonist-  na GLP1 instructions:   Blood Thinner Instructions:na Aspirin Instructions:na  ERAS Protcol -clears until 0430 PRE-SURGERY Ensure or G2-   COVID TEST- na   Anesthesia review: yes- adrenal insufficiency  Patient denies shortness of breath, fever, cough and chest pain at PAT appointment   All instructions explained to the patient, with a verbal understanding of the material. Patient agrees to go over the instructions while at home for a better understanding. The opportunity to ask questions was provided.

## 2024-01-31 NOTE — Progress Notes (Signed)
 Anesthesia Chart Review:  Case: [id number] Date/Time: 02/01/24 0715   Procedure: DECOMPRESSIVE LUMBAR LAMINECTOMY LEVEL 2 - CENTRAL LAMINECTOMY L3-4, L4-5   Anesthesia type: General   Diagnosis: Spinal stenosis of lumbar region, unspecified whether neurogenic claudication present [M48.061]   Pre-op diagnosis: spinal stenosis of lumbar l3-4, l4-5   [facility]: [facility] ROOM 18 / [facility]   Surgeons: Duwayne Purchase, MD       DISCUSSION: [name] is a 74 year old female scheduled for Heidi above procedure.  History includes never smoker, HLD, PVD, prediabetes, reflux esophagitis, empty sella, adrenal insufficiency (empty sella per endocrinology notes; probable chronic communicating hydrocephalus 06/2017 MRI),  Last endocrinology visit with Dr. Tommas was on 11/07/2023 with one year follow-up planned. She is on Cortef  10 mg every morning and 5 mg every afternoon for adrenal insufficiency.  She is advised to take on Heidi morning of surgery. A1c 6.0%.  She had a low risk stress test in 12/2023 per Copland, Harlene BROCKS, MD.  Anesthesia team to evaluate on Heidi day of surgery.  VS: BP (!) 152/75   Pulse 69   Temp 36.6 C   Resp 16   Ht 5' 2 (1.575 m)   Wt 77.2 kg   SpO2 100%   BMI 31.11 kg/m    PROVIDERS: Copland, Harlene BROCKS, MD is PCP Tommas Pears, MD is endocrinologist Magda Ned, MD is vascular surgeon Margaret Carne, MD is neurologist   LABS: Labs reviewed: Acceptable for surgery. (all labs ordered are listed, but only abnormal results are displayed)  Labs Reviewed  HEMOGLOBIN A1C - Abnormal; Notable for Heidi following components:      Result Value   Hgb A1c MFr Bld 6.0 (*)    All other components within normal limits  SURGICAL PCR SCREEN  BASIC METABOLIC PANEL WITH GFR  CBC    IMAGES: MRI L-spine 09/21/2023: MRI lumbar spine (without) demonstrating: - At L3-4: disc bulging and facet hypertrophy with severe spinal stenosis and mild biforaminal stenosis. - At L4-5: disc  bulging and facet hypertrophy with moderate-severe spinal stenosis and mild biforaminal stenosis. - Incidental right renal cyst measuring 3.4 cm.   MRI Head 06/23/2017: IMPRESSION: 1. No acute intracranial process. 2. Redemonstration of probable chronic communicating hydrocephalus. 3. Moderate parenchymal brain volume loss and moderate chronic small vessel ischemic changes.    EKG: 08/28/2023: Sinus rhythm, probable anteroseptal infarct, old   CV: Nuclear stress test 12/25/2023:   Heidi study is normal. Heidi study is low risk.   No ST deviation was noted.   LV perfusion is normal. There is no evidence of ischemia. There is no evidence of infarction.   Left ventricular function is normal. Nuclear stress EF: 83%. Heidi left ventricular ejection fraction is hyperdynamic (>65%). End diastolic cavity size is normal. End systolic cavity size is normal. No evidence of transient ischemic dilation (TID) noted.   CT images were obtained for attenuation correction and were examined for Heidi presence of coronary calcium  when appropriate.   Coronary calcium  was absent on Heidi attenuation correction CT images. Mild aortic root atherosclerosis.   Prior study not available for comparison.  Past Medical History:  Diagnosis Date   Allergic rhinitis due to pollen    Allergy    Anemia, unspecified    Blood transfusion without reported diagnosis    1979   Disorder of bone and cartilage, unspecified    Eosinophilia    Headache 09/24/2016   Lymphocytosis (symptomatic)    Osteoporosis    Other abnormal blood chemistry  Other and unspecified hyperlipidemia    Pain in joint, shoulder region    Peripheral vascular disease    Pre-diabetes    Reflux esophagitis    Umbilical hernia without mention of obstruction or gangrene    Unspecified disorder of liver     Past Surgical History:  Procedure Laterality Date   CATARACT EXTRACTION, BILATERAL     CESAREAN SECTION     COLONOSCOPY  2015   Dr. Debrah,  normal exam    MEDICATIONS:  calcium -vitamin D  (OSCAL WITH D) 500-200 MG-UNIT per tablet   CEQUA  0.09 % SOLN   cetirizine (ZYRTEC) 10 MG tablet   docusate sodium  (COLACE) 100 MG capsule   hydrocortisone  (CORTEF ) 10 MG tablet   l-methylfolate-B6-B12 (METANX) 3-35-2 MG TABS tablet   Multiple Vitamin (MULTIVITAMIN) tablet   simvastatin  (ZOCOR ) 20 MG tablet   traZODone  (DESYREL ) 50 MG tablet   No current facility-administered medications for this encounter.    Isaiah Ruder, PA-C Surgical Short Stay/Anesthesiology [facility] Phone [phone] [facility] Phone [phone] 01/31/2024 3:02 PM

## 2024-01-31 NOTE — Anesthesia Preprocedure Evaluation (Addendum)
 "                                  Anesthesia Evaluation  Patient identified by MRN, date of birth, ID band Patient awake    Reviewed: Allergy & Precautions, NPO status , Patient's Chart, lab work & pertinent test results  History of Anesthesia Complications Negative for: history of anesthetic complications  Airway Mallampati: III  TM Distance: >3 FB Neck ROM: Full    Dental  (+) Dental Advisory Given   Pulmonary neg pulmonary ROS   Pulmonary exam normal breath sounds clear to auscultation       Cardiovascular (-) hypertension(-) angina + Peripheral Vascular Disease  (-) Past MI, (-) Cardiac Stents and (-) CABG (-) dysrhythmias  Rhythm:Regular Rate:Normal  HLD  Low-risk stress test 12/25/2023   Neuro/Psych  Headaches, neg Seizures Hydrocephalus   Neuromuscular disease (lumbar spinal stenosis)    GI/Hepatic Neg liver ROS,GERD  ,,Eosinophilia    Endo/Other  diabetes (Hgb A1c 6.0), Well Controlled, Type 2  Adrenal insufficiency - on hydrocortisone  (did not take this morning, ordering for preop)  Renal/GU negative Renal ROS     Musculoskeletal  (+) Arthritis ,  Osteoporosis    Abdominal   Peds  Hematology  (+) Blood dyscrasia, anemia Lab Results      Component                Value               Date                      WBC                      8.2                 01/31/2024                HGB                      13.2                01/31/2024                HCT                      39.8                01/31/2024                MCV                      85.2                01/31/2024                PLT                      288                 01/31/2024              Anesthesia Other Findings   Reproductive/Obstetrics                              Anesthesia Physical Anesthesia Plan  ASA: 3  Anesthesia Plan: General   Post-op Pain Management: Tylenol  PO (pre-op)*   Induction: Intravenous  PONV Risk Score and Plan:  3 and Ondansetron , Dexamethasone  and Treatment may vary due to age or medical condition  Airway Management Planned: Oral ETT  Additional Equipment:   Intra-op Plan:   Post-operative Plan: Extubation in OR  Informed Consent: I have reviewed the patients History and Physical, chart, labs and discussed the procedure including the risks, benefits and alternatives for the proposed anesthesia with the patient or authorized representative who has indicated his/her understanding and acceptance.     Dental advisory given  Plan Discussed with: CRNA and Anesthesiologist  Anesthesia Plan Comments: (PAT note written 01/31/2024 by Allison Zelenak, PA-C.  Risks of general anesthesia discussed including, but not limited to, sore throat, hoarse voice, chipped/damaged teeth, injury to vocal cords, nausea and vomiting, allergic reactions, lung infection, heart attack, stroke, and death. All questions answered.  )         Anesthesia Quick Evaluation  "

## 2024-01-31 NOTE — H&P (Signed)
 Heidi [name] is an 74 y.o. female.   Chief Complaint: back pain HPI: Location: low back Aggravating Factors: walking Associated Symptoms: weakness; c/o difficulties walking Prior Imaging: MRI (lumbar and left hip-[facility]) Patient presents for an opinion for progressive neurogenic claudication secondary to severe spinal stenosis. Patient reports progressive loss of ambulatory range over the past 3 years. She presents with her husband and daughter. She has hydrocephalus. She also had hyponatremia felt to be adrenal related. He is on supplemental cortisol. She has had ABIs 0.93 on the right and over 1 on the left. For 6 months she has had occasional urinary incontinence none recently. She has had 3 rounds of physical therapy. Is following. Has no upper extremity symptoms. Or weakness in the legs that is relieved when she sits  Past Medical History:  Diagnosis Date   Adrenal insufficiency    Allergic rhinitis due to pollen    Allergy    Anemia, unspecified    Blood transfusion without reported diagnosis    1979   Disorder of bone and cartilage, unspecified    Eosinophilia    Headache 09/24/2016   Lymphocytosis (symptomatic)    Osteoporosis    Other abnormal blood chemistry    Other and unspecified hyperlipidemia    Pain in joint, shoulder region    Peripheral vascular disease    Pre-diabetes    Reflux esophagitis    Umbilical hernia without mention of obstruction or gangrene    Unspecified disorder of liver     Past Surgical History:  Procedure Laterality Date   CATARACT EXTRACTION, BILATERAL     CESAREAN SECTION     COLONOSCOPY  2015   Dr. Debrah, normal exam    Family History  Problem Relation Age of Onset   Pancreatic cancer Neg Hx    Colon cancer Neg Hx    Esophageal cancer Neg Hx    Stomach cancer Neg Hx    Colon polyps Neg Hx    Rectal cancer Neg Hx    Social History:  reports that she has never smoked. She has never used smokeless tobacco. She reports  that she does not drink alcohol and does not use drugs.  Allergies: Allergies[1]  Current meds: atovaquone 250 mg-proguaniL 100 mg tablet hydrocortisone  10 mg tablet simvastatin  20 mg tablet sodium,potassium,mag sulfates 17.5 gram-3.13 gram-1.6 gram oral soln   Results for orders placed or performed during the hospital encounter of 01/31/24 (from the past 48 hours)  Surgical pcr screen     Status: None   Collection Time: 01/31/24 11:33 AM   Specimen: Nasal Mucosa; Nasal Swab  Result Value Ref Range   MRSA, PCR NEGATIVE NEGATIVE   Staphylococcus aureus NEGATIVE NEGATIVE    Comment: (NOTE) The Xpert SA Assay (FDA approved for NASAL specimens in patients 11 years of age and older), is one component of a comprehensive surveillance program. It is not intended to diagnose infection nor to guide or monitor treatment. Performed at [facility], [address], [place], [place] [address]   Basic metabolic panel per protocol     Status: None   Collection Time: 01/31/24 12:00 PM  Result Value Ref Range   Sodium 143 135 - 145 mmol/L   Potassium 3.7 3.5 - 5.1 mmol/L   Chloride 104 98 - 111 mmol/L   CO2 27 22 - 32 mmol/L   Glucose, Bld 89 70 - 99 mg/dL    Comment: Glucose reference range applies only to samples taken after fasting for at  least 8 hours.   BUN 12 8 - 23 mg/dL   Creatinine, Ser 9.04 0.44 - 1.00 mg/dL   Calcium  9.1 8.9 - 10.3 mg/dL   GFR, Estimated >39 >39 mL/min    Comment: (NOTE) Calculated using the CKD-EPI Creatinine Equation (2021)    Anion gap 12 5 - 15    Comment: Performed at [facility], [address] [place], [place] [address]  CBC per protocol     Status: None   Collection Time: 01/31/24 12:00 PM  Result Value Ref Range   WBC 8.2 4.0 - 10.5 K/uL   RBC 4.67 3.87 - 5.11 MIL/uL   Hemoglobin 13.2 12.0 - 15.0 g/dL   HCT 60.1 63.9 - 53.9 %   MCV 85.2 80.0 - 100.0 fL   MCH 28.3 26.0 - 34.0 pg   MCHC 33.2 30.0 - 36.0 g/dL   RDW 87.3 88.4 - 84.4 %    Platelets 288 150 - 400 K/uL   nRBC 0.0 0.0 - 0.2 %    Comment: Performed at [facility], [address] [place], [place] [address]  Hemoglobin A1c     Status: Abnormal   Collection Time: 01/31/24 12:00 PM  Result Value Ref Range   Hgb A1c MFr Bld 6.0 (H) 4.8 - 5.6 %    Comment: (NOTE) Diagnosis of Diabetes The following HbA1c ranges recommended by the American Diabetes Association (ADA) may be used as an aid in the diagnosis of diabetes mellitus.  Hemoglobin             Suggested A1C NGSP%              Diagnosis  <5.7                   Non Diabetic  5.7-6.4                Pre-Diabetic  >6.4                   Diabetic  <7.0                   Glycemic control for                       adults with diabetes.     Mean Plasma Glucose 125.5 mg/dL    Comment: Performed at [facility], [address], [place], [place] [address]   No results found.  Review of Systems  Constitutional: Negative.   HENT: Negative.    Eyes: Negative.   Respiratory: Negative.    Cardiovascular: Negative.   Gastrointestinal: Negative.   Endocrine: Negative.   Genitourinary: Negative.   Musculoskeletal:  Positive for back pain and gait problem.  Neurological:  Positive for weakness and numbness.  Psychiatric/Behavioral: Negative.      There were no vitals taken for this visit. Physical Exam Constitutional:      Appearance: Normal appearance.  HENT:     Head: Normocephalic and atraumatic.     Right Ear: External ear normal.     Left Ear: External ear normal.     Nose: Nose normal.     Mouth/Throat:     Pharynx: Oropharynx is clear.  Eyes:     Conjunctiva/sclera: Conjunctivae normal.  Cardiovascular:     Rate and Rhythm: Normal rate.     Pulses: Normal pulses.  Pulmonary:     Effort: Pulmonary effort is normal.  Abdominal:  General: Abdomen is flat.  Musculoskeletal:     Cervical back: Normal range of motion.     Comments: Gait and Station: Appearance: ambulating  with no assistive devices and antalgic gait.  Constitutional: General Appearance: healthy-appearing and NAD.  Psychiatric: Mood and Affect: active and alert.  Cardiovascular System: Edema Right: none; Dorsalis and posterior tibial pulses 2+. Edema Left: none.  Abdomen: Inspection and Palpation: non-distended and no tenderness.  Skin: Inspection and palpation: no rash.  Lumbar Spine: Inspection: normal alignment. Bony Palpation of the Lumbar Spine: tender at lumbosacral junction.. Bony Palpation of the Right Hip: no tenderness of the greater trochanter and tenderness of the SI joint; Pelvis stable. Bony Palpation of the Left Hip: no tenderness of the greater trochanter and tenderness of the SI joint. Soft Tissue Palpation on the Right: No flank pain with percussion. Active Range of Motion: limited flexion and extention.  Motor Strength: L1 Motor Strength on the Right: hip flexion iliopsoas 5/5. L1 Motor Strength on the Left: hip flexion iliopsoas 5/5. L2-L4 Motor Strength on the Right: knee extension quadriceps 5/5. L2-L4 Motor Strength on the Left: knee extension quadriceps 5/5. L5 Motor Strength on the Right: ankle dorsiflexion tibialis anterior 5/5 and great toe extension extensor hallucis longus 4/5. L5 Motor Strength on the Left: ankle dorsiflexion tibialis anterior 5/5 and great toe extension extensor hallucis longus 4/5. S1 Motor Strength on the Right: plantar flexion gastrocnemius 5/5. S1 Motor Strength on the Left: plantar flexion gastrocnemius 5/5.  Neurological System: Knee Reflex Right: normal (2). Knee Reflex Left: normal (2). Ankle Reflex Right: normal (2). Ankle Reflex Left: normal (2). Babinski Reflex Right: plantar reflex absent. Babinski Reflex Left: plantar reflex absent. Sensation on the Right: normal distal extremities. Sensation on the Left: normal distal extremities. Special Tests on the Right: no clonus of the ankle/knee and seated straight leg raising test positive. Special  Tests on the Left: no clonus of the ankle/knee.  No Babinski or clonus. Upper extremities 5/5 in the upper extremities normoreflexic sensory exams intact  Skin:    General: Skin is warm and dry.  Neurological:     Mental Status: She is alert.    Three-view x-rays demonstrate displaced narrowing there is mild L4-5 L5-S1 some calcification of the aorta. No instability in flexion extension. Hips are unremarkable.  Outside MRI DRI independently reviewed by myself demonstrates severe multifactorial spinal stenosis at L3-4. Due to severe ligamentum flavum hypertrophy and facet hypertrophy. Epidural lipomatosis is noted. Moderate to severe spinal stenosis at L4-5. Epidural lipomatosis noted predominantly at L5-S1  Assessment/Plan Discussed procedure, risks, complications, alternatives. Pt desires to proceed with decompression L3-4, L4-5  Darice CHRISTELLA Randy, PA-C for Dr duwayne 01/31/2024, 8:34 PM       [1]  Allergies Allergen Reactions   Ciprofloxacin Other (See Comments)    Patient states she gets CDIFF Family states this is not correct and this is not an allergy   Alendronate Sodium Other (See Comments)    Other Reaction(s): Unknown fosamax- pt not sure of reaction

## 2024-02-01 ENCOUNTER — Encounter (HOSPITAL_COMMUNITY): Payer: Self-pay | Admitting: Vascular Surgery

## 2024-02-01 ENCOUNTER — Encounter (HOSPITAL_COMMUNITY)
Admission: RE | Disposition: A | Discharge status: Home / Self Care | Payer: Self-pay | Source: Home / Self Care | Attending: Specialist

## 2024-02-01 ENCOUNTER — Ambulatory Visit (HOSPITAL_COMMUNITY)
Admission: RE | Admit: 2024-02-01 | Discharge: 2024-02-03 | Disposition: A | Discharge status: Home / Self Care | Attending: Specialist | Admitting: Specialist

## 2024-02-01 ENCOUNTER — Encounter (HOSPITAL_COMMUNITY): Payer: Self-pay | Admitting: Specialist

## 2024-02-01 ENCOUNTER — Ambulatory Visit (HOSPITAL_COMMUNITY)

## 2024-02-01 DIAGNOSIS — M5416 Radiculopathy, lumbar region: Secondary | ICD-10-CM | POA: Diagnosis present

## 2024-02-01 DIAGNOSIS — M48061 Spinal stenosis, lumbar region without neurogenic claudication: Secondary | ICD-10-CM | POA: Diagnosis present

## 2024-02-01 DIAGNOSIS — E785 Hyperlipidemia, unspecified: Secondary | ICD-10-CM

## 2024-02-01 DIAGNOSIS — M810 Age-related osteoporosis without current pathological fracture: Secondary | ICD-10-CM | POA: Diagnosis not present

## 2024-02-01 DIAGNOSIS — E882 Lipomatosis, not elsewhere classified: Secondary | ICD-10-CM | POA: Insufficient documentation

## 2024-02-01 DIAGNOSIS — E1151 Type 2 diabetes mellitus with diabetic peripheral angiopathy without gangrene: Secondary | ICD-10-CM | POA: Insufficient documentation

## 2024-02-01 DIAGNOSIS — K2100 Gastro-esophageal reflux disease with esophagitis, without bleeding: Secondary | ICD-10-CM | POA: Insufficient documentation

## 2024-02-01 DIAGNOSIS — M48062 Spinal stenosis, lumbar region with neurogenic claudication: Secondary | ICD-10-CM | POA: Diagnosis not present

## 2024-02-01 DIAGNOSIS — E119 Type 2 diabetes mellitus without complications: Secondary | ICD-10-CM

## 2024-02-01 DIAGNOSIS — E2740 Unspecified adrenocortical insufficiency: Secondary | ICD-10-CM | POA: Insufficient documentation

## 2024-02-01 DIAGNOSIS — D649 Anemia, unspecified: Secondary | ICD-10-CM | POA: Diagnosis not present

## 2024-02-01 MED ORDER — PHENYLEPHRINE HCL (PRESSORS) 10 MG/ML IV SOLN
INTRAVENOUS | Status: AC
  Filled 2024-02-01: qty 1

## 2024-02-01 MED ORDER — CHLORHEXIDINE GLUCONATE 0.12 % MT SOLN
15.0000 mL | Freq: Once | OROMUCOSAL | Status: AC
  Administered 2024-02-01: 15 mL via OROMUCOSAL
  Filled 2024-02-01: qty 15

## 2024-02-01 MED ORDER — L-METHYLFOLATE-B6-B12 3-35-2 MG PO TABS
1.0000 | ORAL_TABLET | Freq: Two times a day (BID) | ORAL | Status: DC
  Administered 2024-02-01 – 2024-02-02 (×3): 1 via ORAL
  Filled 2024-02-01 (×4): qty 1

## 2024-02-01 MED ORDER — SIMVASTATIN 20 MG PO TABS
20.0000 mg | ORAL_TABLET | Freq: Every day | ORAL | Status: DC
  Administered 2024-02-01 – 2024-02-02 (×2): 20 mg via ORAL
  Filled 2024-02-01 (×2): qty 1

## 2024-02-01 MED ORDER — ACETAMINOPHEN 650 MG RE SUPP: 650.0000 mg | RECTAL | Status: DC | PRN

## 2024-02-01 MED ORDER — ONDANSETRON HCL 4 MG PO TABS
4.0000 mg | ORAL_TABLET | Freq: Four times a day (QID) | ORAL | Status: DC | PRN
  Administered 2024-02-02 – 2024-02-03 (×2): 4 mg via ORAL
  Filled 2024-02-01 (×2): qty 1

## 2024-02-01 MED ORDER — OXYCODONE HCL 5 MG PO TABS: 5.0000 mg | ORAL_TABLET | Freq: Once | ORAL | Status: DC | PRN

## 2024-02-01 MED ORDER — LACTATED RINGERS IV SOLN: INTRAVENOUS | Status: DC

## 2024-02-01 MED ORDER — OYSTER SHELL CALCIUM/D3 500-5 MG-MCG PO TABS
1.0000 | ORAL_TABLET | Freq: Every day | ORAL | Status: DC
  Administered 2024-02-02: 1 via ORAL
  Filled 2024-02-01: qty 1

## 2024-02-01 MED ORDER — CYCLOSPORINE (PF) 0.09 % OP SOLN: 1.0000 [drp] | Freq: Two times a day (BID) | OPHTHALMIC | Status: DC

## 2024-02-01 MED ORDER — HYDROMORPHONE HCL 1 MG/ML IJ SOLN: 0.5000 mg | INTRAMUSCULAR | Status: DC | PRN

## 2024-02-01 MED ORDER — PROPOFOL 10 MG/ML IV BOLUS
INTRAVENOUS | Status: AC
  Filled 2024-02-01: qty 20

## 2024-02-01 MED ORDER — HYDROMORPHONE HCL 1 MG/ML IJ SOLN
INTRAMUSCULAR | Status: AC
  Filled 2024-02-01: qty 1

## 2024-02-01 MED ORDER — FENTANYL CITRATE (PF) 250 MCG/5ML IJ SOLN
INTRAMUSCULAR | Status: DC | PRN
  Administered 2024-02-01 (×3): 50 ug via INTRAVENOUS

## 2024-02-01 MED ORDER — SUGAMMADEX SODIUM 200 MG/2ML IV SOLN
INTRAVENOUS | Status: DC | PRN
  Administered 2024-02-01: 200 mg via INTRAVENOUS

## 2024-02-01 MED ORDER — PROPOFOL 10 MG/ML IV BOLUS
INTRAVENOUS | Status: DC | PRN
  Administered 2024-02-01: 150 mg via INTRAVENOUS

## 2024-02-01 MED ORDER — PHENYLEPHRINE HCL-NACL 20-0.9 MG/250ML-% IV SOLN
INTRAVENOUS | Status: DC | PRN
  Administered 2024-02-01: 35 ug/min via INTRAVENOUS

## 2024-02-01 MED ORDER — HYDROMORPHONE HCL 1 MG/ML IJ SOLN
0.2500 mg | INTRAMUSCULAR | Status: DC | PRN
  Administered 2024-02-01 (×2): 0.25 mg via INTRAVENOUS

## 2024-02-01 MED ORDER — THROMBIN 20000 UNITS EX SOLR
CUTANEOUS | Status: AC
  Filled 2024-02-01: qty 20000

## 2024-02-01 MED ORDER — LIDOCAINE 2% (20 MG/ML) 5 ML SYRINGE
INTRAMUSCULAR | Status: DC | PRN
  Administered 2024-02-01: 100 mg via INTRAVENOUS

## 2024-02-01 MED ORDER — MAGNESIUM CITRATE PO SOLN: 1.0000 | Freq: Once | ORAL | Status: DC | PRN

## 2024-02-01 MED ORDER — BISACODYL 5 MG PO TBEC: 5.0000 mg | DELAYED_RELEASE_TABLET | Freq: Every day | ORAL | Status: DC | PRN

## 2024-02-01 MED ORDER — LORATADINE 10 MG PO TABS
10.0000 mg | ORAL_TABLET | Freq: Every day | ORAL | Status: DC
  Administered 2024-02-02: 10 mg via ORAL
  Filled 2024-02-01: qty 1

## 2024-02-01 MED ORDER — ROCURONIUM BROMIDE 10 MG/ML (PF) SYRINGE
PREFILLED_SYRINGE | INTRAVENOUS | Status: DC | PRN
  Administered 2024-02-01: 60 mg via INTRAVENOUS

## 2024-02-01 MED ORDER — BUPIVACAINE-EPINEPHRINE (PF) 0.5% -1:200000 IJ SOLN
INTRAMUSCULAR | Status: AC
  Filled 2024-02-01: qty 30

## 2024-02-01 MED ORDER — BUPIVACAINE-EPINEPHRINE 0.5% -1:200000 IJ SOLN
INTRAMUSCULAR | Status: DC | PRN
  Administered 2024-02-01: 6 mL

## 2024-02-01 MED ORDER — ACETAMINOPHEN 10 MG/ML IV SOLN
1000.0000 mg | INTRAVENOUS | Status: AC
  Administered 2024-02-01: 1000 mg via INTRAVENOUS
  Filled 2024-02-01: qty 100

## 2024-02-01 MED ORDER — MENTHOL 3 MG MT LOZG: 1.0000 | LOZENGE | OROMUCOSAL | Status: DC | PRN

## 2024-02-01 MED ORDER — FENTANYL CITRATE (PF) 250 MCG/5ML IJ SOLN
INTRAMUSCULAR | Status: AC
  Filled 2024-02-01: qty 5

## 2024-02-01 MED ORDER — TRAZODONE HCL 50 MG PO TABS
50.0000 mg | ORAL_TABLET | Freq: Every evening | ORAL | Status: DC | PRN
  Filled 2024-02-01: qty 1

## 2024-02-01 MED ORDER — CEFAZOLIN SODIUM-DEXTROSE 2-4 GM/100ML-% IV SOLN
2.0000 g | INTRAVENOUS | Status: AC
  Administered 2024-02-01: 2 g via INTRAVENOUS
  Filled 2024-02-01: qty 100

## 2024-02-01 MED ORDER — HYDROCORTISONE 10 MG PO TABS
10.0000 mg | ORAL_TABLET | Freq: Once | ORAL | Status: AC
  Administered 2024-02-01: 10 mg via ORAL
  Filled 2024-02-01: qty 1

## 2024-02-01 MED ORDER — DEXAMETHASONE SOD PHOSPHATE PF 10 MG/ML IJ SOLN
INTRAMUSCULAR | Status: DC | PRN
  Administered 2024-02-01: 8 mg via INTRAVENOUS

## 2024-02-01 MED ORDER — LACTATED RINGERS IV SOLN: INTRAVENOUS | Status: DC | PRN

## 2024-02-01 MED ORDER — ONDANSETRON HCL 4 MG/2ML IJ SOLN
4.0000 mg | Freq: Four times a day (QID) | INTRAMUSCULAR | Status: DC | PRN
  Administered 2024-02-01 – 2024-02-02 (×2): 4 mg via INTRAVENOUS
  Filled 2024-02-01 (×3): qty 2

## 2024-02-01 MED ORDER — METHOCARBAMOL 500 MG PO TABS
500.0000 mg | ORAL_TABLET | Freq: Four times a day (QID) | ORAL | Status: DC | PRN
  Administered 2024-02-01 – 2024-02-02 (×2): 500 mg via ORAL
  Filled 2024-02-01 (×2): qty 1

## 2024-02-01 MED ORDER — HYDROCORTISONE 10 MG PO TABS
10.0000 mg | ORAL_TABLET | Freq: Every day | ORAL | Status: DC
  Administered 2024-02-02: 10 mg via ORAL
  Filled 2024-02-01 (×2): qty 1

## 2024-02-01 MED ORDER — OXYCODONE HCL 5 MG PO TABS
5.0000 mg | ORAL_TABLET | ORAL | Status: DC | PRN
  Administered 2024-02-01 – 2024-02-03 (×4): 5 mg via ORAL
  Filled 2024-02-01 (×6): qty 1

## 2024-02-01 MED ORDER — SUCCINYLCHOLINE CHLORIDE 200 MG/10ML IV SOSY
PREFILLED_SYRINGE | INTRAVENOUS | Status: DC | PRN
  Administered 2024-02-01: 80 mg via INTRAVENOUS

## 2024-02-01 MED ORDER — CEFAZOLIN SODIUM-DEXTROSE 2-4 GM/100ML-% IV SOLN
2.0000 g | Freq: Three times a day (TID) | INTRAVENOUS | Status: AC
  Administered 2024-02-01 (×2): 2 g via INTRAVENOUS
  Filled 2024-02-01 (×2): qty 100

## 2024-02-01 MED ORDER — RISAQUAD PO CAPS
1.0000 | ORAL_CAPSULE | Freq: Every day | ORAL | Status: DC
  Administered 2024-02-02: 1 via ORAL
  Filled 2024-02-01: qty 1

## 2024-02-01 MED ORDER — 0.9 % SODIUM CHLORIDE (POUR BTL) OPTIME
TOPICAL | Status: DC | PRN
  Administered 2024-02-01: 1000 mL

## 2024-02-01 MED ORDER — OXYCODONE HCL 5 MG/5ML PO SOLN: 5.0000 mg | Freq: Once | ORAL | Status: DC | PRN

## 2024-02-01 MED ORDER — ORAL CARE MOUTH RINSE: 15.0000 mL | Freq: Once | OROMUCOSAL | Status: AC

## 2024-02-01 MED ORDER — OXYCODONE HCL 5 MG PO TABS
10.0000 mg | ORAL_TABLET | ORAL | Status: DC | PRN
  Administered 2024-02-02 – 2024-02-03 (×4): 10 mg via ORAL
  Filled 2024-02-01 (×4): qty 2

## 2024-02-01 MED ORDER — HYDROCORTISONE 5 MG PO TABS: 5.0000 mg | ORAL_TABLET | ORAL | Status: DC

## 2024-02-01 MED ORDER — THROMBIN 20000 UNITS EX SOLR
CUTANEOUS | Status: DC | PRN
  Administered 2024-02-01: 20 mL via TOPICAL

## 2024-02-01 MED ORDER — DOCUSATE SODIUM 100 MG PO CAPS
100.0000 mg | ORAL_CAPSULE | Freq: Two times a day (BID) | ORAL | Status: DC
  Administered 2024-02-01 – 2024-02-02 (×3): 100 mg via ORAL
  Filled 2024-02-01 (×3): qty 1

## 2024-02-01 MED ORDER — HYDROCORTISONE 5 MG PO TABS
5.0000 mg | ORAL_TABLET | Freq: Every day | ORAL | Status: DC
  Administered 2024-02-01 – 2024-02-02 (×2): 5 mg via ORAL
  Filled 2024-02-01 (×3): qty 1

## 2024-02-01 MED ORDER — AMISULPRIDE (ANTIEMETIC) 5 MG/2ML IV SOLN: 10.0000 mg | Freq: Once | INTRAVENOUS | Status: DC | PRN

## 2024-02-01 MED ORDER — PHENOL 1.4 % MT LIQD: 1.0000 | OROMUCOSAL | Status: DC | PRN

## 2024-02-01 MED ORDER — POLYETHYLENE GLYCOL 3350 17 G PO PACK
17.0000 g | PACK | Freq: Every day | ORAL | Status: DC
  Administered 2024-02-02: 17 g via ORAL
  Filled 2024-02-01: qty 1

## 2024-02-01 MED ORDER — KCL IN DEXTROSE-NACL 20-5-0.9 MEQ/L-%-% IV SOLN
INTRAVENOUS | Status: AC
  Filled 2024-02-01 (×2): qty 1000

## 2024-02-01 MED ORDER — ACETAMINOPHEN 325 MG PO TABS
650.0000 mg | ORAL_TABLET | ORAL | Status: DC | PRN
  Administered 2024-02-01: 650 mg via ORAL
  Filled 2024-02-01: qty 2

## 2024-02-01 MED ORDER — METHOCARBAMOL 1000 MG/10ML IJ SOLN: 500.0000 mg | Freq: Four times a day (QID) | INTRAMUSCULAR | Status: DC | PRN

## 2024-02-01 MED ORDER — ALBUMIN HUMAN 5 % IV SOLN: INTRAVENOUS | Status: DC | PRN

## 2024-02-01 MED ORDER — ALUM & MAG HYDROXIDE-SIMETH 200-200-20 MG/5ML PO SUSP: 30.0000 mL | Freq: Four times a day (QID) | ORAL | Status: DC | PRN

## 2024-02-01 MED ORDER — PHENYLEPHRINE 80 MCG/ML (10ML) SYRINGE FOR IV PUSH (FOR BLOOD PRESSURE SUPPORT)
PREFILLED_SYRINGE | INTRAVENOUS | Status: DC | PRN
  Administered 2024-02-01 (×3): 160 ug via INTRAVENOUS
  Administered 2024-02-01: 80 ug via INTRAVENOUS

## 2024-02-01 MED ORDER — ONDANSETRON HCL 4 MG/2ML IJ SOLN
INTRAMUSCULAR | Status: DC | PRN
  Administered 2024-02-01: 4 mg via INTRAVENOUS

## 2024-02-01 NOTE — Brief Op Note (Signed)
 02/01/2024  7:30 AM  7:15 AM  PATIENT:  Heidi [name]  74 y.o. female  PRE-OPERATIVE DIAGNOSIS:  spinal stenosis of lumbar l3-4, l4-5  POST-OPERATIVE DIAGNOSIS:  * No post-op diagnosis entered *  PROCEDURE:  Procedures with comments: DECOMPRESSIVE LUMBAR LAMINECTOMY LEVEL 2 (N/A) - CENTRAL LAMINECTOMY L3-4, L4-5  SURGEON:  Surgeons and Role:    * Duwayne Purchase, MD - Primary  PHYSICIAN ASSISTANT:   ASSISTANTS: Bissell   ANESTHESIA:   general  EBL:  50   BLOOD ADMINISTERED:none  DRAINS: none   LOCAL MEDICATIONS USED:  MARCAINE      SPECIMEN:  No Specimen  DISPOSITION OF SPECIMEN:  N/A  COUNTS:  YES  TOURNIQUET:  * No tourniquets in log *  DICTATION: .Other Dictation: Dictation Number [id number]  PLAN OF CARE: Admit for overnight observation  PATIENT DISPOSITION:  PACU - hemodynamically stable.   Delay start of Pharmacological VTE agent (>24hrs) due to surgical blood loss or risk of bleeding: yes

## 2024-02-01 NOTE — Interval H&P Note (Signed)
 History and Physical Interval Note:  02/01/2024 7:14 AM  Heidi [name]  has presented today for surgery, with the diagnosis of spinal stenosis of lumbar l3-4, l4-5.  The various methods of treatment have been discussed with the patient and family. After consideration of risks, benefits and other options for treatment, the patient has consented to  Procedures with comments: DECOMPRESSIVE LUMBAR LAMINECTOMY LEVEL 2 (N/A) - CENTRAL LAMINECTOMY L3-4, L4-5 as a surgical intervention.  The patient's history has been reviewed, patient examined, no change in status, stable for surgery.  I have reviewed the patient's chart and labs.  Questions were answered to the patient's satisfaction.     [name] Billing  Patient has a partial foot drop on the right.  In 3/5 dorsiflexion.  Good dorsiflexion on the left.  Patient is indicated for decompression at L3-4 and L4-5.  Risks and benefits has been discussed.

## 2024-02-01 NOTE — Anesthesia Procedure Notes (Signed)
 Procedure Name: Intubation Date/Time: 02/01/2024 7:42 AM  Performed by: Boyce Shilling, CRNAPre-anesthesia Checklist: Patient identified, Emergency Drugs available, Suction available, Timeout performed and Patient being monitored Patient Re-evaluated:Patient Re-evaluated prior to induction Oxygen Delivery Method: Circle system utilized Preoxygenation: Pre-oxygenation with 100% oxygen Induction Type: IV induction Ventilation: Mask ventilation without difficulty Laryngoscope Size: Mac and 3 Grade View: Grade II Tube type: Oral Tube size: 7.0 mm Number of attempts: 1 Airway Equipment and Method: Stylet Placement Confirmation: ETT inserted through vocal cords under direct vision, positive ETCO2, CO2 detector and breath sounds checked- equal and bilateral Secured at: 22 cm Tube secured with: Tape Dental Injury: Teeth and Oropharynx as per pre-operative assessment

## 2024-02-01 NOTE — Op Note (Signed)
 Heidi [name], CABEZA MEDICAL RECORD NO: [id number] ACCOUNT NO: [id number] DATE OF BIRTH: Apr 22, 1950 FACILITY: [facility] LOCATION: [facility] PHYSICIAN: Reyes KYM Billing, MD  Operative Report   DATE OF PROCEDURE: 02/01/2024  PREOPERATIVE DIAGNOSES:   1.  Spinal stenosis, L3-L4 and L4-L5. 2.  Epidural lipomatosis, L3-L4 and L4-L5.  POSTOPERATIVE DIAGNOSES:   1.  Spinal stenosis, L3-L4 and L4-L5. 2.  Epidural lipomatosis, L3-L4 and L4-L5. 3.  Elevated body mass index of 31.  PROCEDURE PERFORMED:   1.  Central laminectomy of L3-L4 and L4-L5 with bilateral medial hemifacetectomies and foraminotomies of L4 and L5. 2.  Excision of epidural lipomatosis, L3-L4 and L4-L5.  ANESTHESIA:  General.  ASSISTANT:  Jaclyn Bissell, PA.  HISTORY:  This is a 74 year old with bilateral lower extremity neurogenic claudication and foot drop on the right, progressive, failing conservative treatment, indicated for decompression of L3-L4 and L4-L5 for underlying severe spinal stenosis at those  levels with epidural lipomatosis.  Risks and benefits were discussed, including bleeding, infection, damage to neurovascular structures, no change in symptoms or worsening of symptoms, DVT and PE, and anesthetic complications, etc.  DESCRIPTION OF PROCEDURE:  The patient placed in the supine position after induction of adequate general anesthesia and 2 grams of Kefzol , placed prone on a Wilson frame.  All bony prominences were well padded.  The lumbar region was prepped and draped  in the usual sterile fashion.  Two 18-gauge spinal needles were utilized to localize the L3-L4 and L4-L5 interspace, which was confirmed with x-ray.  An incision was made from the spinous process of L3 to below L5.  Subcutaneous tissue was dissected.   Electrocautery was utilized to achieve hemostasis.  This patient had an ample subcutaneous adipose layer.  The dorsolumbar fascia was divided in line with the skin incision.  The paraspinous muscles  were elevated from the lamina of L3-L4 and of L4-L5.  A  McCulloch retractor was placed.  The operating microscope was draped and brought into the surgical field.  A Leksell rongeur was utilized to remove the spinous processes of L3, L4, and partially of L5.  Starting first at L3-L4, hemilaminotomies of the  caudad edge of L3 were performed with a Leksell and a high-speed burr.  This was extended cephalad with a 2 mm Kerrison.  We removed approximately 15% of the inferior medial portion of the facet bilaterally.  This was identified in the superior  articulating process bilaterally and the hypertrophied ligamentum flavum, which was noted at the interspace.  We continued with the hemilaminotomy cephalad to the point of detaching the ligamentum flavum.  A straight curette was utilized to detach the  ligamentum flavum from the cephalad edge of L4.  I used a Woodson and [place] to enter the epidural space, where epidural adipose tissue was noted.  Centrally, we first removed the ligamentum flavum centrally and then sequentially right and left,  removed the ligamentum flavum from the entire interspace, decompressing the lateral recess to the medial border of the pedicles.  Superior hypertrophied ligamentum flavum was noted and compression of the thecal sac.  We undercut the facets bilaterally.   We performed hemilaminotomies of the cephalad edge of L4 as well as neural elements well protected and utilized a curette to smooth over the laminotomy edges.  There was no evidence of a disk herniation.  Bipolar electrocautery was utilized to achieve  hemostasis.  Bone wax was placed on the cancellous surfaces.  Epidural fat, which was also extensive at this level, was removed with  a pituitary and a Woodson, sweeping under the lamina of L4 and L3.  The area was irrigated, and I covered it with a  neuropatty.  Attention was turned next to L4-L5.  In a similar fashion, we performed hemilaminotomies of the caudad edge of  L4 to the point cephalad, detaching the ligamentum flavum.  A straight curette was utilized to detach the ligamentum flavum from  the cephalad edge of L5.  We entered the epidural space centrally and then with a Woodson protected the dura.  We removed the ligamentum flavum bilaterally, undercutting the facets and decompressing the lateral recess to the medial border of the pedicle.   Exuberant hypertrophic epidural fat was noted as well.  This was excised.  There was no disk herniation noted on either side.  Foraminotomies of L5 were performed.  Hemilaminotomies of the cephalad edge of L5 were performed as well, and we swept  beneath the lamina to remove epidural fat that was extending from L5-S1.  Following the decompression, there was good restoration of the thecal sac noted.  There was no CSF leakage.  Bone wax was placed on the cancellous surface.  A Woodson probe was  passed freely underneath the lamina of L5, below the pedicle of L5, below the pedicle of L4, at the foramen of L4, and above the pedicle of L3.  This was copiously irrigated as well.  A confirmatory radiograph was obtained with a Woodson in the foramen  of L4 and of L5.  Following this and copious irrigation, thrombin -soaked Gelfoam was placed and then removed.  There was no evidence of CSF leakage or active bleeding.  There was good restoration of the thecal sac.  We removed the [facility] retractor,  irrigated the paraspinous musculature, and achieved strict hemostasis with bipolar electrocautery.  We then closed the dorsolumbar fascia with 1 Vicryl in a figure-of-eight suture, subcu with multiple 2-0 layers, and skin with staples.  The wound was  dressed sterilely.  The patient was placed supine on the hospital bed, extubated without difficulty, and transported to the recovery room in satisfactory condition.  The patient tolerated the procedure well.  There were no complications.  BLOOD LOSS:  50 mL.  The assistant, Jaclyn Bissell,  PA, was used throughout the case for patient positioning, general intermittent neuro traction, suction, and closure.   PUS D: 02/01/2024 10:13:50 am T: 02/01/2024 10:40:00 am  JOB: [id number]/ [id number]

## 2024-02-01 NOTE — Discharge Instructions (Signed)
 Walk As Tolerated utilizing back precautions.  No bending, twisting, or lifting.  No driving for 2 weeks.   Aquacel dressing may remain in place until follow up. May shower with aquacel dressing in place. If the dressing peels off or becomes saturated, you may remove aquacel dressing and place gauze and tape dressing which should be kept clean and dry and changed daily. Do not remove steri-strips if they are present. See Dr. Duwayne in office in 10 to 14 days. Begin taking aspirin 81mg  per day starting 4 days after your surgery if not allergic to aspirin or on another blood thinner. Walk daily even outside. Use a cane or walker only if necessary. Avoid sitting on soft sofas.

## 2024-02-01 NOTE — Evaluation (Signed)
 Occupational Therapy Evaluation Patient Details Name: Heidi [name] MRN: [id number] DOB: January 23, 1950 Today's Date: 02/01/2024   History of Present Illness   74 yo F s/p decompression sx.  PMH includes: Unsteady Gait, hyponatremia, NPH.     Clinical Impressions Patient having difficulty with bed mobility and mobility to the bathroom this date due to pain.  Mod A for bed mobility, Mod to Max for lower body dressing.  She will have 24 hour assist at home, but would benefit from additional education and mobility, including stairs, before heading home.  PT Consult pending.       If plan is discharge home, recommend the following:   Assist for transportation;A lot of help with bathing/dressing/bathroom;A little help with walking and/or transfers     Functional Status Assessment   Patient has had a recent decline in their functional status and demonstrates the ability to make significant improvements in function in a reasonable and predictable amount of time.     Equipment Recommendations   BSC/3in1     Recommendations for Other Services         Precautions/Restrictions   Precautions Precautions: Back;Fall Precaution Booklet Issued: Yes (comment) Recall of Precautions/Restrictions: Impaired Restrictions Weight Bearing Restrictions Per Provider Order: No     Mobility Bed Mobility Overal bed mobility: Needs Assistance Bed Mobility: Sidelying to Sit, Sit to Sidelying   Sidelying to sit: Min assist     Sit to sidelying: Mod assist      Transfers Overall transfer level: Needs assistance Equipment used: Rolling walker (2 wheels) Transfers: Sit to/from Stand Sit to Stand: Contact guard assist                  Balance Overall balance assessment: Needs assistance Sitting-balance support: Feet supported Sitting balance-Leahy Scale: Fair     Standing balance support: Reliant on assistive device for balance Standing balance-Leahy Scale: Poor                              ADL either performed or assessed with clinical judgement   ADL       Grooming: Wash/dry hands;Wash/dry face;Supervision/safety;Standing               Lower Body Dressing: Maximal assistance;Sit to/from stand   Toilet Transfer: Contact guard assist;Rolling walker (2 wheels);Regular Toilet;Ambulation                   Vision Patient Visual Report: No change from baseline       Perception Perception: Not tested       Praxis Praxis: Not tested       Pertinent Vitals/Pain Pain Assessment Pain Assessment: Faces Faces Pain Scale: Hurts little more Pain Location: Back Pain Descriptors / Indicators: Grimacing Pain Intervention(s): Monitored during session     Extremity/Trunk Assessment Upper Extremity Assessment Upper Extremity Assessment: Overall WFL for tasks assessed   Lower Extremity Assessment Lower Extremity Assessment: Defer to PT evaluation   Cervical / Trunk Assessment Cervical / Trunk Assessment: Kyphotic   Communication Communication Communication: No apparent difficulties   Cognition Arousal: Alert Behavior During Therapy: Flat affect Cognition: No apparent impairments                               Following commands: Intact       Cueing  General Comments   Cueing Techniques: Verbal cues      Exercises  Shoulder Instructions      Home Living Family/patient expects to be discharged to:: Private residence Living Arrangements: Spouse/significant other;Children Available Help at Discharge: Family;Available 24 hours/day Type of Home: House Home Access: Stairs to enter [employer] of Steps: 5 Entrance Stairs-Rails: Right;Left Home Layout: Two level;Able to live on main level with bedroom/bathroom     Bathroom Shower/Tub: Producer, Television/film/video: Standard Bathroom Accessibility: Yes How Accessible: Accessible via walker Home Equipment: Rolling Walker (2  wheels);Cane - single point          Prior Functioning/Environment Prior Level of Function : Needs assist             Mobility Comments: Walks short distances ADLs Comments: Assist with lower body ADL.  Family completes meals and iADL    OT Problem List: Pain;Impaired balance (sitting and/or standing);Decreased strength   OT Treatment/Interventions: Self-care/ADL training;Therapeutic activities      OT Goals(Current goals can be found in the care plan section)   Acute Rehab OT Goals Patient Stated Goal: Return home OT Goal Formulation: With patient Time For Goal Achievement: 02/15/24 Potential to Achieve Goals: Good ADL Goals Pt Will Perform Grooming: with modified independence;standing Pt Will Perform Lower Body Dressing: with min assist;sit to/from stand Pt Will Transfer to Toilet: with modified independence;ambulating;regular height toilet   OT Frequency:  Min 2X/week    Co-evaluation              AM-PAC OT 6 Clicks Daily Activity     Outcome Measure Help from another person eating meals?: None Help from another person taking care of personal grooming?: A Little Help from another person toileting, which includes using toliet, bedpan, or urinal?: A Little Help from another person bathing (including washing, rinsing, drying)?: A Lot Help from another person to put on and taking off regular upper body clothing?: A Little Help from another person to put on and taking off regular lower body clothing?: A Lot 6 Click Score: 17   End of Session Equipment Utilized During Treatment: Rolling walker (2 wheels) Nurse Communication: Mobility status  Activity Tolerance: Patient limited by pain Patient left: in bed;with call bell/phone within reach;with family/visitor present  OT Visit Diagnosis: Unsteadiness on feet (R26.81);Pain                Time: 1530-1555 OT Time Calculation (min): 25 min Charges:  OT General Charges $OT Visit: 1 Visit OT Evaluation $OT  Eval Moderate Complexity: 1 Mod OT Treatments $Self Care/Home Management : 8-22 mins  02/01/2024  RP, OTR/L  Acute Rehabilitation Services  Office:  [phone]   Heidi [name] 02/01/2024, 4:02 PM

## 2024-02-01 NOTE — Transfer of Care (Signed)
 Immediate Anesthesia Transfer of Care Note  Patient: Heidi [name]  Procedure(s) Performed: DECOMPRESSIVE LUMBAR CENTRAL LAMINECTOMY LUMBAR THREE-LUMBAR FOUR LUMBAR FOUR-LUMBAR FIVE  Patient Location: PACU  Anesthesia Type:General  Level of Consciousness: awake and drowsy  Airway & Oxygen Therapy: Patient Spontanous Breathing and Patient connected to face mask oxygen  Post-op Assessment: Report given to RN and Post -op Vital signs reviewed and stable  Post vital signs: Reviewed and stable  Last Vitals:  Vitals Value Taken Time  BP 116/77 02/01/24 10:30  Temp 36.4 C 02/01/24 10:21  Pulse 76 02/01/24 10:40  Resp 15 02/01/24 10:40  SpO2 96 % 02/01/24 10:40  Vitals shown include unfiled device data.  Last Pain:  Vitals:   02/01/24 1036  TempSrc:   PainSc: 5          Complications: No notable events documented.

## 2024-02-01 NOTE — Anesthesia Postprocedure Evaluation (Signed)
"   Anesthesia Post Note  Patient: Conservation [employer], [employer]  Procedure(s) Performed: DECOMPRESSIVE LUMBAR CENTRAL LAMINECTOMY LUMBAR THREE-LUMBAR FOUR LUMBAR FOUR-LUMBAR FIVE     Patient location during evaluation: PACU Anesthesia Type: General Level of consciousness: awake Pain management: pain level controlled Vital Signs Assessment: post-procedure vital signs reviewed and stable Respiratory status: spontaneous breathing, nonlabored ventilation and respiratory function stable Cardiovascular status: blood pressure returned to baseline and stable Postop Assessment: no apparent nausea or vomiting Anesthetic complications: no   No notable events documented.  Last Vitals:  Vitals:   02/01/24 1145 02/01/24 1228  BP: 119/70 138/75  Pulse: 76 67  Resp: 15 16  Temp:  36.5 C  SpO2: 93% 99%    Last Pain:  Vitals:   02/01/24 1300  TempSrc:   PainSc: 4                  Delon Aisha Arch      "

## 2024-02-02 ENCOUNTER — Encounter (HOSPITAL_COMMUNITY): Payer: Self-pay | Admitting: Specialist

## 2024-02-02 DIAGNOSIS — M48062 Spinal stenosis, lumbar region with neurogenic claudication: Secondary | ICD-10-CM | POA: Diagnosis not present

## 2024-02-02 LAB — BASIC METABOLIC PANEL WITH GFR
Anion gap: 9 (ref 5–15)
BUN: 12 mg/dL (ref 8–23)
CO2: 27 mmol/L (ref 22–32)
Calcium: 9.2 mg/dL (ref 8.9–10.3)
Chloride: 105 mmol/L (ref 98–111)
Creatinine, Ser: 1.01 mg/dL — ABNORMAL HIGH (ref 0.44–1.00)
GFR, Estimated: 59 mL/min — ABNORMAL LOW
Glucose, Bld: 111 mg/dL — ABNORMAL HIGH (ref 70–99)
Potassium: 4.2 mmol/L (ref 3.5–5.1)
Sodium: 141 mmol/L (ref 135–145)

## 2024-02-02 LAB — CBC
HCT: 35.1 % — ABNORMAL LOW (ref 36.0–46.0)
Hemoglobin: 11.7 g/dL — ABNORMAL LOW (ref 12.0–15.0)
MCH: 28.6 pg (ref 26.0–34.0)
MCHC: 33.3 g/dL (ref 30.0–36.0)
MCV: 85.8 fL (ref 80.0–100.0)
Platelets: 240 10*3/uL (ref 150–400)
RBC: 4.09 MIL/uL (ref 3.87–5.11)
RDW: 12.7 % (ref 11.5–15.5)
WBC: 16.7 10*3/uL — ABNORMAL HIGH (ref 4.0–10.5)
nRBC: 0 % (ref 0.0–0.2)

## 2024-02-02 MED ORDER — ONDANSETRON HCL 4 MG PO TABS: 4.0000 mg | ORAL_TABLET | Freq: Three times a day (TID) | ORAL | 0 refills | Status: AC | PRN

## 2024-02-02 MED ORDER — ACETAMINOPHEN 500 MG PO TABS
1000.0000 mg | ORAL_TABLET | Freq: Four times a day (QID) | ORAL | Status: DC
  Administered 2024-02-02 – 2024-02-03 (×3): 1000 mg via ORAL
  Filled 2024-02-02 (×3): qty 2

## 2024-02-02 MED ORDER — POLYETHYLENE GLYCOL 3350 17 G PO PACK: 17.0000 g | PACK | Freq: Every day | ORAL | 0 refills | Status: AC

## 2024-02-02 MED ORDER — OXYCODONE HCL 5 MG PO TABS: 5.0000 mg | ORAL_TABLET | ORAL | 0 refills | Status: AC | PRN

## 2024-02-02 NOTE — Care Management (Addendum)
 Transition of Care [facility]) - Inpatient Brief Assessment   Patient Details  Name: Heidi [name] MRN: [id number] Date of Birth: Mar 14, 1950  Transition of Care [facility] CM/SW Contact:    Corean JAYSON Canary, RN Phone Number: 02/02/2024, 4:50 PM   Clinical Narrative:  74 yo  L3-4 decompression. Ordered home health, no HH in [place]/[facility] sent out in H[facility] for acceptances. Will present to patient for choice Only one acceptance [facility] accepted and placed on AVS Transition of Care Asessment: Insurance and Status: Insurance coverage has been reviewed Patient has primary care physician: Yes Home environment has been reviewed: with family Prior level of function:: Independent Prior/Current Home Services: No current home services   Readmission risk has been reviewed: Yes Transition of care needs: transition of care needs identified, TOC will continue to follow

## 2024-02-02 NOTE — Care Management Obs Status (Signed)
 MEDICARE OBSERVATION STATUS NOTIFICATION   Patient Details  Name: Heidi [name] MRN: [id number] Date of Birth: [birth year]   Medicare Observation Status Notification Given:  Yes    Jennie Laneta Dragon 02/02/2024, 9:42 AM

## 2024-02-02 NOTE — Discharge Summary (Signed)
 " Physician Discharge Summary   Patient ID: Heidi [name] MRN: [id number] DOB/AGE: 10-Dec-1950 74 y.o.  Admit date: 02/01/2024 Discharge date: 02/02/2024  Primary Diagnosis:   spinal stenosis of lumbar l3-4, l4-5  Admission Diagnoses:  Past Medical History:  Diagnosis Date   Adrenal insufficiency    Allergic rhinitis due to pollen    Allergy    Anemia, unspecified    Blood transfusion without reported diagnosis    1979   Disorder of bone and cartilage, unspecified    Eosinophilia    Headache 09/24/2016   Lymphocytosis (symptomatic)    Osteoporosis    Other abnormal blood chemistry    Other and unspecified hyperlipidemia    Pain in joint, shoulder region    Peripheral vascular disease    Pre-diabetes    Reflux esophagitis    Umbilical hernia without mention of obstruction or gangrene    Unspecified disorder of liver    Discharge Diagnoses:   Principal Problem:   Spinal stenosis of lumbar region with radiculopathy  Procedure:  Procedures (LRB): DECOMPRESSIVE LUMBAR CENTRAL LAMINECTOMY LUMBAR THREE-LUMBAR FOUR LUMBAR FOUR-LUMBAR FIVE (N/A)   Consults: None  HPI:  See H&P    Laboratory Data: Hospital Outpatient Visit on 01/31/2024  Component Date Value Ref Range Status   Sodium 01/31/2024 143  135 - 145 mmol/L Final   Potassium 01/31/2024 3.7  3.5 - 5.1 mmol/L Final   Chloride 01/31/2024 104  98 - 111 mmol/L Final   CO2 01/31/2024 27  22 - 32 mmol/L Final   Glucose, Bld 01/31/2024 89  70 - 99 mg/dL Final   Glucose reference range applies only to samples taken after fasting for at least 8 hours.   BUN 01/31/2024 12  8 - 23 mg/dL Final   Creatinine, Ser 01/31/2024 0.95  0.44 - 1.00 mg/dL Final   Calcium  01/31/2024 9.1  8.9 - 10.3 mg/dL Final   GFR, Estimated 01/31/2024 >60  >60 mL/min Final   Comment: (NOTE) Calculated using the CKD-EPI Creatinine Equation (2021)    Anion gap 01/31/2024 12  5 - 15 Final   Performed at [facility], [address],  [place], [place] [address]   WBC 01/31/2024 8.2  4.0 - 10.5 K/uL Final   RBC 01/31/2024 4.67  3.87 - 5.11 MIL/uL Final   Hemoglobin 01/31/2024 13.2  12.0 - 15.0 g/dL Final   HCT 98/71/7973 39.8  36.0 - 46.0 % Final   MCV 01/31/2024 85.2  80.0 - 100.0 fL Final   MCH 01/31/2024 28.3  26.0 - 34.0 pg Final   MCHC 01/31/2024 33.2  30.0 - 36.0 g/dL Final   RDW 98/71/7973 12.6  11.5 - 15.5 % Final   Platelets 01/31/2024 288  150 - 400 K/uL Final   nRBC 01/31/2024 0.0  0.0 - 0.2 % Final   Performed at [facility], [address] [place], [place] [address]   MRSA, PCR 01/31/2024 NEGATIVE  NEGATIVE Final   Staphylococcus aureus 01/31/2024 NEGATIVE  NEGATIVE Final   Comment: (NOTE) The Xpert SA Assay (FDA approved for NASAL specimens in patients 82 years of age and older), is one component of a comprehensive surveillance program. It is not intended to diagnose infection nor to guide or monitor treatment. Performed at [facility], [address], [place], [place] [address]    Hgb A1c MFr Bld 01/31/2024 6.0 (H)  4.8 - 5.6 % Final   Comment: (NOTE) Diagnosis of Diabetes The following HbA1c ranges recommended by  the American Diabetes Association (ADA) may be used as an aid in the diagnosis of diabetes mellitus.  Hemoglobin             Suggested A1C NGSP%              Diagnosis  <5.7                   Non Diabetic  5.7-6.4                Pre-Diabetic  >6.4                   Diabetic  <7.0                   Glycemic control for                       adults with diabetes.     Mean Plasma Glucose 01/31/2024 125.5  mg/dL Final   Performed at [facility], [address], [place], [place] [address]   Recent Labs    01/31/24 1200  HGB 13.2   Recent Labs    01/31/24 1200  WBC 8.2  RBC 4.67  HCT 39.8  PLT 288   Recent Labs    01/31/24 1200  NA 143  K 3.7  CL 104  CO2 27  BUN 12  CREATININE 0.95  GLUCOSE 89  CALCIUM  9.1   No results for input(s): LABPT,  INR in the last 72 hours.  X-Rays:DG Lumbar Spine 2-3 Views Result Date: 02/01/2024 EXAM: 2 VIEW(S) XRAY OF THE LUMBAR SPINE 01/31/2024 12:44:22 PM COMPARISON: 09/21/2023 CLINICAL HISTORY: Preoperative evaluation for subsequent laminectomy. FINDINGS: LUMBAR SPINE: BONES: 5 lumbar-type vertebral bodies. Vertebral body height is well maintained. Alignment is normal. DISCS AND DEGENERATIVE CHANGES: No severe degenerative changes. SOFT TISSUES: No soft tissue abnormality is noted. IMPRESSION: 1. No acute findings. Electronically signed by: Oneil Devonshire MD 02/01/2024 08:26 PM EST RP Workstation: [id number]   DG Lumbar Spine 2-3 Views Result Date: 02/01/2024 EXAM: 2 or 3 VIEW(S) XRAY OF THE LUMBAR SPINE 02/01/2024 10:06:00 AM COMPARISON: Plain film from 01/31/2024. CLINICAL HISTORY: Lumbar laminectomy. FINDINGS: Initial images demonstrate needles in the posterior soft tissues at the L2-3 and L4 levels. Subsequently, surgical retractors and instruments are noted at L3-4 and L4-5. Completion images demonstrate instruments about the L4-5 interspace. IMPRESSION: 1. Intraoperative localization for lumbar laminectomy. Electronically signed by: Oneil Devonshire MD 02/01/2024 08:24 PM EST RP Workstation: [id number]    EKG: Orders placed or performed during the hospital encounter of 08/28/23   ED EKG   ED EKG   EKG   EKG     Hospital Course: Patient was admitted to [facility] and taken to the OR and underwent the above state procedure without complications.  Patient tolerated the procedure well and was later transferred to the recovery room and then to the orthopaedic floor for postoperative care.  They were given PO and IV analgesics for pain control following their surgery.  They were given 24 hours of postoperative antibiotics.   PT was consulted postop to assist with mobility and transfers.  The patient was allowed to be WBAT with therapy and was taught back precautions. Discharge planning was consulted  to help with postop disposition and equipment needs.  Patient had a good night on the evening of surgery and started to get up OOB with therapy on day one. Patient was seen in rounds and was  ready to go home on day one.  They were given discharge instructions and dressing directions.  They were instructed on when to follow up in the office with Dr. Duwayne.   Diet: Regular diet Activity:WBAT, lumbar precautions Follow-up:in 10-14 days- Disposition - Home Discharged Condition: good   Discharge Instructions     Call MD / Call 911   Complete by: As directed    If you experience chest pain or shortness of breath, CALL 911 and be transported to the hospital emergency room.  If you develope a fever above 101 F, pus (white drainage) or increased drainage or redness at the wound, or calf pain, call your surgeon's office.   Constipation Prevention   Complete by: As directed    Drink plenty of fluids.  Prune juice may be helpful.  You may use a stool softener, such as Colace (over the counter) 100 mg twice a day.  Use MiraLax  (over the counter) for constipation as needed.   Increase activity slowly as tolerated   Complete by: As directed    Post-operative opioid taper instructions:   Complete by: As directed    POST-OPERATIVE OPIOID TAPER INSTRUCTIONS: It is important to wean off of your opioid medication as soon as possible. If you do not need pain medication after your surgery it is ok to stop day one. Opioids include: Codeine, Hydrocodone(Norco, Vicodin), Oxycodone (Percocet, oxycontin ) and hydromorphone  amongst others.  Long term and even short term use of opiods can cause: Increased pain response Dependence Constipation Depression Respiratory depression And more.  Withdrawal symptoms can include Flu like symptoms Nausea, vomiting And more Techniques to manage these symptoms Hydrate well Eat regular healthy meals Stay active Use relaxation techniques(deep breathing, meditating,  yoga) Do Not substitute Alcohol to help with tapering If you have been on opioids for less than two weeks and do not have pain than it is ok to stop all together.  Plan to wean off of opioids This plan should start within one week post op of your joint replacement. Maintain the same interval or time between taking each dose and first decrease the dose.  Cut the total daily intake of opioids by one tablet each day Next start to increase the time between doses. The last dose that should be eliminated is the evening dose.         Allergies as of 02/02/2024       Reactions   Ciprofloxacin Other (See Comments)   Patient states she gets CDIFF Family states this is not correct and this is not an allergy   Alendronate Sodium Other (See Comments)   Other Reaction(s): Unknown fosamax- pt not sure of reaction        Medication List     TAKE these medications    calcium -vitamin D  500-200 MG-UNIT tablet Commonly known as: OSCAL WITH D Take 1 tablet by mouth daily.   Cequa  0.09 % Soln Generic drug: cycloSPORINE  (PF) Apply 1 drop to eye 2 (two) times daily. What changed: how to take this   cetirizine 10 MG tablet Commonly known as: ZYRTEC Take 10 mg by mouth daily.   docusate sodium  100 MG capsule Commonly known as: COLACE Take 100 mg by mouth 2 (two) times daily.   hydrocortisone  10 MG tablet Commonly known as: CORTEF  Take 5-10 mg by mouth See admin instructions. 10 mg in the morning, 5 mg in the evening   l-methylfolate-B6-B12 3-35-2 MG Tabs tablet Commonly known as: METANX Take 1 tablet by mouth 2 (two)  times daily.   multivitamin tablet Take 1 tablet by mouth daily.   oxyCODONE  5 MG immediate release tablet Commonly known as: Oxy IR/ROXICODONE  Take 1 tablet (5 mg total) by mouth every 4 (four) hours as needed for severe pain (pain score 7-10).   polyethylene glycol 17 g packet Commonly known as: MIRALAX  / GLYCOLAX  Take 17 g by mouth daily.   simvastatin  20 MG  tablet Commonly known as: ZOCOR  Take 1 tablet (20 mg total) by mouth at bedtime.   traZODone  50 MG tablet Commonly known as: DESYREL  Take 50 mg by mouth at bedtime as needed for sleep.         Signed: Gianne Shugars PA-C Orthopaedic Surgery 02/02/2024, 8:16 AM   "

## 2024-02-02 NOTE — Evaluation (Signed)
 Physical Therapy Evaluation  Patient Details Name: Heidi [name] MRN: [id number] DOB: 09/01/1950 Today's Date: 02/02/2024  History of Present Illness  Pt is a 74 y/o F s/p decompression sx.  PMH includes: Unsteady Gait, hyponatremia, NPH.  Clinical Impression  Pt admitted with above diagnosis. At the time of PT eval, pt was able to demonstrate transfers and ambulation with gross min assist and RW for support. Pt moving slow and with difficulty clearing floor when advancing feet. N/V limiting session. Pt was educated on precautions, brace application/wearing schedule, appropriate activity progression, and car transfer. Pt currently with functional limitations due to the deficits listed below (see PT Problem List). Pt will benefit from skilled PT to increase their independence and safety with mobility to allow discharge to the venue listed below.          If plan is discharge home, recommend the following: A little help with bathing/dressing/bathroom;A little help with walking and/or transfers;Assistance with cooking/housework;Assist for transportation;Help with stairs or ramp for entrance   Can travel by private vehicle        Equipment Recommendations None recommended by PT  Recommendations for Other Services       Functional Status Assessment Patient has had a recent decline in their functional status and demonstrates the ability to make significant improvements in function in a reasonable and predictable amount of time.     Precautions / Restrictions Precautions Precautions: Back;Fall Precaution Booklet Issued: Yes (comment) Recall of Precautions/Restrictions: Impaired Precaution/Restrictions Comments: Reviewed handout and pt was cued for precautions during functional mobility. Restrictions Weight Bearing Restrictions Per Provider Order: No      Mobility  Bed Mobility Overal bed mobility: Needs Assistance Bed Mobility: Sidelying to Sit, Sit to Sidelying, Rolling Rolling:  Min assist Sidelying to sit: Min assist     Sit to sidelying: Mod assist General bed mobility comments: VC's for optimal log roll technique. Assist for all aspects of transition to/from EOB.    Transfers Overall transfer level: Needs assistance Equipment used: Rolling walker (2 wheels) Transfers: Sit to/from Stand Sit to Stand: Min assist           General transfer comment: Assist to power up to full stand. VC's for hand placement on seated surface for safety.    Ambulation/Gait Ambulation/Gait assistance: Min assist Gait Distance (Feet): 25 Feet Assistive device: Rolling walker (2 wheels) Gait Pattern/deviations: Decreased stride length, Shuffle, Trunk flexed, Wide base of support Gait velocity: Decreased Gait velocity interpretation: <1.31 ft/sec, indicative of household ambulator   General Gait Details: Shuffling gait pattern in room to go to/from bathroom.  Stairs            Wheelchair Mobility     Tilt Bed    Modified Rankin (Stroke Patients Only)       Balance Overall balance assessment: Needs assistance Sitting-balance support: Feet supported Sitting balance-Leahy Scale: Fair     Standing balance support: Reliant on assistive device for balance Standing balance-Leahy Scale: Poor                               Pertinent Vitals/Pain Pain Assessment Pain Assessment: Faces Faces Pain Scale: Hurts whole lot Pain Location: Back Pain Descriptors / Indicators: Grimacing, Operative site guarding, Tender Pain Intervention(s): Limited activity within patient's tolerance, Monitored during session, Repositioned    Home Living Family/patient expects to be discharged to:: Private residence Living Arrangements: Spouse/significant other;Children Available Help at Discharge: Family;Available 24 hours/day Type  of Home: House Home Access: Stairs to enter Entrance Stairs-Rails: Doctor, General Practice of Steps: 5   Home Layout: Two  level;Able to live on main level with bedroom/bathroom Home Equipment: Rolling Walker (2 wheels);Cane - single point      Prior Function Prior Level of Function : Needs assist             Mobility Comments: Walks short distances ADLs Comments: Assist with lower body ADL.  Family completes meals and iADL     Extremity/Trunk Assessment   Upper Extremity Assessment Upper Extremity Assessment: Defer to OT evaluation    Lower Extremity Assessment Lower Extremity Assessment: Generalized weakness;RLE deficits/detail RLE Deficits / Details: Pt appears to be more limited in RLE vs L. Decreased strength and muscular endurance.    Cervical / Trunk Assessment Cervical / Trunk Assessment: Kyphotic  Communication   Communication Communication: No apparent difficulties    Cognition Arousal: Alert Behavior During Therapy: Flat affect                             Following commands: Intact       Cueing Cueing Techniques: Verbal cues     General Comments General comments (skin integrity, edema, etc.): Pt became nauseated when up in the bathroom. Vomited and was not able to perform any further mobility.    Exercises     Assessment/Plan    PT Assessment Patient needs continued PT services  PT Problem List Decreased strength;Decreased activity tolerance;Decreased mobility;Decreased balance;Decreased knowledge of use of DME;Decreased safety awareness;Decreased knowledge of precautions;Pain       PT Treatment Interventions DME instruction;Gait training;Stair training;Functional mobility training;Therapeutic activities;Therapeutic exercise;Balance training;Patient/family education    PT Goals (Current goals can be found in the Care Plan section)  Acute Rehab PT Goals Patient Stated Goal: Home at d/c - hoping for today PT Goal Formulation: With patient/family Time For Goal Achievement: 02/09/24 Potential to Achieve Goals: Good    Frequency Min 5X/week      Co-evaluation               AM-PAC PT 6 Clicks Mobility  Outcome Measure Help needed turning from your back to your side while in a flat bed without using bedrails?: A Little Help needed moving from lying on your back to sitting on the side of a flat bed without using bedrails?: A Little Help needed moving to and from a bed to a chair (including a wheelchair)?: A Little Help needed standing up from a chair using your arms (e.g., wheelchair or bedside chair)?: A Little Help needed to walk in hospital room?: A Little Help needed climbing 3-5 steps with a railing? : A Little 6 Click Score: 18    End of Session Equipment Utilized During Treatment: Gait belt Activity Tolerance: Patient tolerated treatment well Patient left: in bed;with call bell/phone within reach;with family/visitor present Nurse Communication: Mobility status PT Visit Diagnosis: Unsteadiness on feet (R26.81);Pain Pain - part of body:  (back)    Time: 0900-0930 PT Time Calculation (min) (ACUTE ONLY): 30 min   Charges:   PT Evaluation $PT Eval Low Complexity: 1 Low PT Treatments $Gait Training: 8-22 mins PT General Charges $$ ACUTE PT VISIT: 1 Visit         Leita Sable, PT, DPT Acute Rehabilitation Services Secure Chat Preferred Office: [phone]   Leita JONETTA Sable 02/02/2024, 1:01 PM

## 2024-02-02 NOTE — Progress Notes (Signed)
 Subjective: 1 Day Post-Op Procedures (LRB): DECOMPRESSIVE LUMBAR CENTRAL LAMINECTOMY LUMBAR THREE-LUMBAR FOUR LUMBAR FOUR-LUMBAR FIVE (N/A) Patient reports pain as mild.    Objective: Vital signs in last 24 hours: Temp:  [97.3 F (36.3 C)-98.4 F (36.9 C)] 98.3 F (36.8 C) (01/30 0709) Pulse Rate:  [67-91] 86 (01/30 0709) Resp:  [12-18] 18 (01/30 0709) BP: (115-138)/(64-87) 115/64 (01/30 0709) SpO2:  [92 %-100 %] 97 % (01/30 0709)  Intake/Output from previous day: 01/29 0701 - 01/30 0700 In: 2030 [P.O.:480; I.V.:1100; IV Piggyback:450] Out: 550 [Urine:450; Blood:100] Intake/Output this shift: No intake/output data recorded.  Recent Labs    01/31/24 1200  HGB 13.2   Recent Labs    01/31/24 1200  WBC 8.2  RBC 4.67  HCT 39.8  PLT 288   Recent Labs    01/31/24 1200  NA 143  K 3.7  CL 104  CO2 27  BUN 12  CREATININE 0.95  GLUCOSE 89  CALCIUM  9.1   No results for input(s): LABPT, INR in the last 72 hours.  Neurologically intact ABD soft Neurovascular intact Sensation intact distally Intact pulses distally Dorsiflexion/Plantar flexion intact Incision: dressing C/D/I and no drainage No cellulitis present Compartment soft No sign of DVT   Assessment/Plan: 1 Day Post-Op Procedures (LRB): DECOMPRESSIVE LUMBAR CENTRAL LAMINECTOMY LUMBAR THREE-LUMBAR FOUR LUMBAR FOUR-LUMBAR FIVE (N/A) Advance diet Up with therapy D/C IV fluids D/C to home Discussed D/C instructions   Antinio Sanderfer M Glyn Gerads 02/02/2024, 8:14 AM

## 2024-02-02 NOTE — Progress Notes (Signed)
 Physical Therapy Treatment Patient Details Name: Nika Yazzie MRN: [id number] DOB: [birth year] Today's Date: 02/02/2024   History of Present Illness Pt is a 74 y/o F s/p decompression sx.  PMH includes: Unsteady Gait, hyponatremia, NPH.    PT Comments  PT contacted for additional stair training prior to pt's d/c home anticipated to be this evening. Pt greeted supine in bed with multiple family members present. Reviewed back precautions with pt recalling 1/3 (no bending) independently and the other 2/3 (no lifting or twisting) with hints by PT demonstrating the activities to avoid. She required mod-maxA for bed mobility using log roll technique. Pt was limited by pain and declined further OOB mobility. She reported she would prefer to stay another night and receive therapy tomorrow. Pt encouraged to change positions regularly, complete bed<>chair transfer, and mobilize frequently with staff assistance using RW. Will continue to follow acutely and advance appropriately.     If plan is discharge home, recommend the following: Assistance with cooking/housework;Assist for transportation;Help with stairs or ramp for entrance;Supervision due to cognitive status;A little help with walking and/or transfers;A little help with bathing/dressing/bathroom   Can travel by private vehicle        Equipment Recommendations  BSC/3in1;Wheelchair (measurements PT);Wheelchair cushion (measurements PT)    Recommendations for Other Services       Precautions / Restrictions Precautions Precautions: Back;Fall Precaution Booklet Issued: Yes (comment) Recall of Precautions/Restrictions: Impaired Precaution/Restrictions Comments: Pt recalled 1/3 back precautions. She was able to remember the other 2 once BLT was stated and PT demonstrated motions to clue her in. She requires cues to adhere to precautions during session. Required Braces or Orthoses:  (No brace needed) Restrictions Weight Bearing Restrictions Per  Provider Order: No     Mobility  Bed Mobility Overal bed mobility: Needs Assistance Bed Mobility: Rolling, Sidelying to Sit, Sit to Sidelying Rolling: Mod assist, Used rails Sidelying to sit: Mod assist, HOB elevated, Used rails     Sit to sidelying: Max assist General bed mobility comments: Reviewed log roll technique. Cues for sequencing. Pt sat up on R side of bed with increased time. Assist to bring LLE into knee flex. Pt slowly reached for bed rail. Assist at trunk/pelvis to reach sidelying. Assist to bring BLE off EOB and elevate trunk. Returning to bed PT aided in bring BLE back in and easing trunk down.    Transfers                   General transfer comment: Pt declined OOB mobility d/t back pain. She decided she wanted to rest and stay another day recieving therapy to hopefully be able to d/c.    Ambulation/Gait                   Stairs             Wheelchair Mobility     Tilt Bed    Modified Rankin (Stroke Patients Only)       Balance Overall balance assessment: Needs assistance Sitting-balance support: Feet supported, Bilateral upper extremity supported Sitting balance-Leahy Scale: Fair Sitting balance - Comments: Sat EOB with CGA                                    Communication Communication Communication: No apparent difficulties  Cognition Arousal: Alert Behavior During Therapy: Flat affect   PT - Cognitive impairments: Problem solving, Sequencing, Safety/Judgement, Memory  PT - Cognition Comments: Pt with difficulty recalling back precautions and adhereing to them during mobility. She appears to have delayed processing and decreased insight into current condition. Following commands: Impaired Following commands impaired: Follows one step commands with increased time    Cueing Cueing Techniques: Verbal cues, Gestural cues, Tactile cues  Exercises      General Comments General  comments (skin integrity, edema, etc.): Family present and supportive during session. Encouraged frequent mobility and position changes including OOB<>chair, transferring into the bathroom to use commode, and ambulating in hallway with staff assistance.      Pertinent Vitals/Pain Pain Assessment Pain Assessment: Faces Faces Pain Scale: Hurts whole lot Pain Location: Back Pain Descriptors / Indicators: Operative site guarding, Grimacing Pain Intervention(s): Monitored during session, Limited activity within patient's tolerance, Repositioned    Home Living                          Prior Function            PT Goals (current goals can now be found in the care plan section) Acute Rehab PT Goals Patient Stated Goal: Go Home PT Goal Formulation: With patient/family Time For Goal Achievement: 02/09/24 Potential to Achieve Goals: Good Progress towards PT goals: Not progressing toward goals - comment (limited by pain)    Frequency    Min 5X/week      PT Plan      Co-evaluation              AM-PAC PT 6 Clicks Mobility   Outcome Measure  Help needed turning from your back to your side while in a flat bed without using bedrails?: A Lot Help needed moving from lying on your back to sitting on the side of a flat bed without using bedrails?: A Lot Help needed moving to and from a bed to a chair (including a wheelchair)?: A Lot Help needed standing up from a chair using your arms (e.g., wheelchair or bedside chair)?: A Lot Help needed to walk in hospital room?: A Lot Help needed climbing 3-5 steps with a railing? : A Lot 6 Click Score: 12    End of Session Equipment Utilized During Treatment: Gait belt Activity Tolerance: Patient limited by pain Patient left: in bed;with call bell/phone within reach;with bed alarm set;with family/visitor present Nurse Communication: Mobility status;Patient requests pain meds PT Visit Diagnosis: Unsteadiness on feet  (R26.81);Pain Pain - part of body:  (back)     Time: 8284-8274 PT Time Calculation (min) (ACUTE ONLY): 10 min  Charges:    $Gait Training: 38-52 mins $Therapeutic Activity: 8-22 mins PT General Charges $$ ACUTE PT VISIT: 1 Visit                     Randall SAUNDERS, PT, DPT Acute Rehabilitation Services Office: [phone] Secure Chat Preferred  Delon CHRISTELLA Callander 02/02/2024, 5:44 PM

## 2024-02-03 ENCOUNTER — Other Ambulatory Visit (HOSPITAL_COMMUNITY): Payer: Self-pay

## 2024-02-03 DIAGNOSIS — M48062 Spinal stenosis, lumbar region with neurogenic claudication: Secondary | ICD-10-CM | POA: Diagnosis not present

## 2024-02-03 MED ORDER — TIZANIDINE HCL 2 MG PO TABS
2.0000 mg | ORAL_TABLET | Freq: Two times a day (BID) | ORAL | 1 refills | Status: AC | PRN
  Filled 2024-02-03: qty 30, 15d supply, fill #0

## 2024-02-03 MED ORDER — OXYCODONE HCL 5 MG PO TABS
5.0000 mg | ORAL_TABLET | ORAL | 0 refills | Status: AC | PRN
  Filled 2024-02-03: qty 40, 7d supply, fill #0

## 2024-02-03 NOTE — Progress Notes (Signed)
" ° °  Subjective:  74 y.o. female now POD 2 from L3-4, L4-5 lumbar laminectomy. Patient reports that she is doing well and has improvements in her leg strength and feeling.   Otherwise, no acute events overnight. No numbness or tingling to the extremity.  Objective:   VITALS:   Vitals:   02/02/24 1824 02/02/24 1941 02/03/24 0535 02/03/24 0712  BP: (!) 144/74 (!) 154/82 122/72 130/81  Pulse: 77 75 80 68  Resp: 18 18 18 19   Temp: 98.6 F (37 C) 98.6 F (37 C) (!) 97.5 F (36.4 C) 97.6 F (36.4 C)  TempSrc: Oral Oral Oral Oral  SpO2: 99% 98% 95% 94%  Weight:      Height:        Physical Exam: General: Alert, no acute distress Cardiovascular: No pedal edema Respiratory: No cyanosis, no use of accessory musculature GI: No organomegaly, abdomen is soft and non-tender Skin: No lesions in the area of chief complaint Neurologic: Sensation intact distally Psychiatric: Patient is competent for consent with normal mood and affect Lymphatic: No axillary or cervical lymphadenopathy  MUSCULOSKELETAL: Spine - Dressing c/d/i - TTP peri incisionally - Motor intact 5/5 L3-S1 nerve distributions - Sensation intact to light touch L3-S1 nerve distributions - 2+ DP pulse  Lab Results  Component Value Date   WBC 16.7 (H) 02/02/2024   HGB 11.7 (L) 02/02/2024   HCT 35.1 (L) 02/02/2024   MCV 85.8 02/02/2024   PLT 240 02/02/2024   BMET    Component Value Date/Time   NA 141 02/02/2024 0813   NA 140 11/02/2023 0000   NA 138 11/19/2012 1251   K 4.2 02/02/2024 0813   K 3.8 11/19/2012 1251   CL 105 02/02/2024 0813   CL 108 (H) 04/23/2012 1302   CO2 27 02/02/2024 0813   CO2 27 11/19/2012 1251   GLUCOSE 111 (H) 02/02/2024 0813   GLUCOSE 80 11/19/2012 1251   GLUCOSE 88 04/23/2012 1302   BUN 12 02/02/2024 0813   BUN 12 11/02/2023 0000   BUN 13.7 11/19/2012 1251   CREATININE 1.01 (H) 02/02/2024 0813   CREATININE 1.10 (H) 09/25/2019 1057   CREATININE 0.9 11/19/2012 1251   CALCIUM  9.2  02/02/2024 0813   CALCIUM  9.2 11/19/2012 1251   EGFR 52 11/02/2023 0000   GFRNONAA 59 (L) 02/02/2024 0813   GFRNONAA 82 03/02/2015 1145     Assessment/Plan: 74 y.o. female  now 2 Days Post-Op from Principal Problem:   Spinal stenosis of lumbar region with radiculopathy .  Patient is doing well.  She is planned for discharge today.  Medications sent to hospital pharmacy  Activities as tolerated Pain control: oxycodone  PT/OT Bowel regimen: docusate Dispo: plan to discharge home today   Rankin LELON Pizza 02/03/2024, 4:59 PM  "

## 2024-02-03 NOTE — Progress Notes (Signed)
 Patient alert and oriented, voided, ambulated. Surgical site clean and dry no sign of infection. D/c instructions explain and given to the patient and family all questions answered. Patient d/c home with RW, 3 in 1 per order.

## 2024-02-27 ENCOUNTER — Ambulatory Visit: Payer: Medicare HMO

## 2024-03-11 ENCOUNTER — Telehealth: Admitting: Diagnostic Neuroimaging
